# Patient Record
Sex: Female | Born: 1950
Health system: Southern US, Community
[De-identification: ages and names within clinical notes are randomized; demographics above are authoritative.]

## PROBLEM LIST (undated history)

## (undated) DIAGNOSIS — F419 Anxiety disorder, unspecified: Secondary | ICD-10-CM

## (undated) DIAGNOSIS — Z8489 Family history of other specified conditions: Secondary | ICD-10-CM

## (undated) DIAGNOSIS — F32A Depression, unspecified: Secondary | ICD-10-CM

## (undated) DIAGNOSIS — C50919 Malignant neoplasm of unspecified site of unspecified female breast: Secondary | ICD-10-CM

## (undated) DIAGNOSIS — G9332 Myalgic encephalomyelitis/chronic fatigue syndrome: Secondary | ICD-10-CM

## (undated) DIAGNOSIS — Z9189 Other specified personal risk factors, not elsewhere classified: Secondary | ICD-10-CM

## (undated) DIAGNOSIS — E785 Hyperlipidemia, unspecified: Secondary | ICD-10-CM

## (undated) DIAGNOSIS — Z8719 Personal history of other diseases of the digestive system: Secondary | ICD-10-CM

## (undated) DIAGNOSIS — R600 Localized edema: Secondary | ICD-10-CM

## (undated) DIAGNOSIS — R51 Headache: Secondary | ICD-10-CM

## (undated) DIAGNOSIS — T4145XA Adverse effect of unspecified anesthetic, initial encounter: Secondary | ICD-10-CM

## (undated) DIAGNOSIS — G9050 Complex regional pain syndrome I, unspecified: Secondary | ICD-10-CM

## (undated) DIAGNOSIS — M3500 Sicca syndrome, unspecified: Secondary | ICD-10-CM

## (undated) DIAGNOSIS — I10 Essential (primary) hypertension: Secondary | ICD-10-CM

## (undated) DIAGNOSIS — R5382 Chronic fatigue, unspecified: Secondary | ICD-10-CM

## (undated) DIAGNOSIS — R011 Cardiac murmur, unspecified: Secondary | ICD-10-CM

## (undated) DIAGNOSIS — M2550 Pain in unspecified joint: Secondary | ICD-10-CM

## (undated) DIAGNOSIS — G629 Polyneuropathy, unspecified: Secondary | ICD-10-CM

## (undated) DIAGNOSIS — I341 Nonrheumatic mitral (valve) prolapse: Secondary | ICD-10-CM

## (undated) DIAGNOSIS — F329 Major depressive disorder, single episode, unspecified: Secondary | ICD-10-CM

## (undated) DIAGNOSIS — M545 Low back pain, unspecified: Secondary | ICD-10-CM

## (undated) DIAGNOSIS — I739 Peripheral vascular disease, unspecified: Secondary | ICD-10-CM

## (undated) DIAGNOSIS — Z860100 Personal history of colon polyps, unspecified: Secondary | ICD-10-CM

## (undated) DIAGNOSIS — M1990 Unspecified osteoarthritis, unspecified site: Secondary | ICD-10-CM

## (undated) DIAGNOSIS — G8929 Other chronic pain: Secondary | ICD-10-CM

## (undated) DIAGNOSIS — T8859XA Other complications of anesthesia, initial encounter: Secondary | ICD-10-CM

## (undated) DIAGNOSIS — Z8709 Personal history of other diseases of the respiratory system: Secondary | ICD-10-CM

## (undated) DIAGNOSIS — R42 Dizziness and giddiness: Secondary | ICD-10-CM

## (undated) DIAGNOSIS — Z86718 Personal history of other venous thrombosis and embolism: Secondary | ICD-10-CM

## (undated) DIAGNOSIS — M62838 Other muscle spasm: Secondary | ICD-10-CM

## (undated) DIAGNOSIS — M797 Fibromyalgia: Secondary | ICD-10-CM

## (undated) DIAGNOSIS — D649 Anemia, unspecified: Secondary | ICD-10-CM

## (undated) HISTORY — PX: NASAL SINUS SURGERY: SHX719

## (undated) HISTORY — PX: COLONOSCOPY: SHX174

## (undated) HISTORY — DX: Anxiety disorder, unspecified: F41.9

## (undated) HISTORY — DX: Major depressive disorder, single episode, unspecified: F32.9

## (undated) HISTORY — DX: Myalgic encephalomyelitis/chronic fatigue syndrome: G93.32

## (undated) HISTORY — DX: Headache: R51

## (undated) HISTORY — PX: ESOPHAGOGASTRODUODENOSCOPY: SHX1529

## (undated) HISTORY — PX: LUMBAR DISC SURGERY: SHX700

## (undated) HISTORY — PX: TONSILLECTOMY: SUR1361

## (undated) HISTORY — DX: Chronic fatigue, unspecified: R53.82

## (undated) HISTORY — PX: OTHER SURGICAL HISTORY: SHX169

## (undated) HISTORY — PX: BACK SURGERY: SHX140

## (undated) HISTORY — DX: Unspecified osteoarthritis, unspecified site: M19.90

## (undated) HISTORY — DX: Fibromyalgia: M79.7

## (undated) HISTORY — DX: Essential (primary) hypertension: I10

## (undated) HISTORY — DX: Nonrheumatic mitral (valve) prolapse: I34.1

## (undated) HISTORY — DX: Depression, unspecified: F32.A

## (undated) HISTORY — PX: INNER EAR SURGERY: SHX679

## (undated) HISTORY — DX: Complex regional pain syndrome I, unspecified: G90.50

---

## 1994-03-31 HISTORY — PX: FOOT SURGERY: SHX648

## 1997-03-31 HISTORY — PX: OTHER SURGICAL HISTORY: SHX169

## 1997-10-24 ENCOUNTER — Other Ambulatory Visit: Admission: RE | Admit: 1997-10-24 | Discharge: 1997-10-24 | Payer: Self-pay | Admitting: *Deleted

## 1998-03-31 ENCOUNTER — Emergency Department (HOSPITAL_COMMUNITY): Admission: EM | Admit: 1998-03-31 | Discharge: 1998-03-31 | Payer: Self-pay | Admitting: Emergency Medicine

## 1998-03-31 ENCOUNTER — Encounter: Payer: Self-pay | Admitting: Emergency Medicine

## 1998-04-05 ENCOUNTER — Encounter: Payer: Self-pay | Admitting: Orthopedic Surgery

## 1998-04-05 ENCOUNTER — Ambulatory Visit (HOSPITAL_COMMUNITY): Admission: RE | Admit: 1998-04-05 | Discharge: 1998-04-05 | Payer: Self-pay | Admitting: Orthopedic Surgery

## 1998-04-17 ENCOUNTER — Emergency Department (HOSPITAL_COMMUNITY): Admission: EM | Admit: 1998-04-17 | Discharge: 1998-04-17 | Payer: Self-pay | Admitting: Emergency Medicine

## 1998-04-19 ENCOUNTER — Inpatient Hospital Stay (HOSPITAL_COMMUNITY): Admission: RE | Admit: 1998-04-19 | Discharge: 1998-04-22 | Payer: Self-pay | Admitting: Orthopedic Surgery

## 1998-12-18 ENCOUNTER — Other Ambulatory Visit: Admission: RE | Admit: 1998-12-18 | Discharge: 1998-12-18 | Payer: Self-pay | Admitting: *Deleted

## 1999-11-05 ENCOUNTER — Encounter: Payer: Self-pay | Admitting: Neurosurgery

## 1999-11-07 ENCOUNTER — Inpatient Hospital Stay (HOSPITAL_COMMUNITY): Admission: RE | Admit: 1999-11-07 | Discharge: 1999-11-07 | Payer: Self-pay | Admitting: Neurosurgery

## 1999-11-07 ENCOUNTER — Encounter: Payer: Self-pay | Admitting: Neurosurgery

## 2000-04-03 ENCOUNTER — Encounter: Admission: RE | Admit: 2000-04-03 | Discharge: 2000-04-03 | Payer: Self-pay | Admitting: Family Medicine

## 2000-04-03 ENCOUNTER — Encounter: Payer: Self-pay | Admitting: Family Medicine

## 2001-03-14 ENCOUNTER — Encounter: Payer: Self-pay | Admitting: Emergency Medicine

## 2001-03-14 ENCOUNTER — Emergency Department (HOSPITAL_COMMUNITY): Admission: EM | Admit: 2001-03-14 | Discharge: 2001-03-14 | Payer: Self-pay | Admitting: Emergency Medicine

## 2003-03-23 ENCOUNTER — Other Ambulatory Visit: Admission: RE | Admit: 2003-03-23 | Discharge: 2003-03-23 | Payer: Self-pay | Admitting: Family Medicine

## 2003-03-30 ENCOUNTER — Encounter: Admission: RE | Admit: 2003-03-30 | Discharge: 2003-03-30 | Payer: Self-pay | Admitting: Family Medicine

## 2003-04-21 ENCOUNTER — Inpatient Hospital Stay (HOSPITAL_COMMUNITY): Admission: EM | Admit: 2003-04-21 | Discharge: 2003-04-25 | Payer: Self-pay

## 2004-04-24 ENCOUNTER — Encounter: Admission: RE | Admit: 2004-04-24 | Discharge: 2004-04-24 | Payer: Self-pay | Admitting: Family Medicine

## 2004-05-01 ENCOUNTER — Encounter: Admission: RE | Admit: 2004-05-01 | Discharge: 2004-05-01 | Payer: Self-pay | Admitting: Family Medicine

## 2004-05-13 ENCOUNTER — Other Ambulatory Visit: Admission: RE | Admit: 2004-05-13 | Discharge: 2004-05-13 | Payer: Self-pay | Admitting: Obstetrics and Gynecology

## 2004-06-12 ENCOUNTER — Emergency Department (HOSPITAL_COMMUNITY): Admission: EM | Admit: 2004-06-12 | Discharge: 2004-06-12 | Payer: Self-pay | Admitting: Emergency Medicine

## 2005-10-14 ENCOUNTER — Encounter: Admission: RE | Admit: 2005-10-14 | Discharge: 2005-10-14 | Payer: Self-pay | Admitting: Family Medicine

## 2006-01-21 ENCOUNTER — Encounter: Admission: RE | Admit: 2006-01-21 | Discharge: 2006-01-21 | Payer: Self-pay | Admitting: Family Medicine

## 2006-04-07 ENCOUNTER — Ambulatory Visit (HOSPITAL_COMMUNITY): Admission: RE | Admit: 2006-04-07 | Discharge: 2006-04-08 | Payer: Self-pay | Admitting: Surgery

## 2006-04-07 ENCOUNTER — Encounter (INDEPENDENT_AMBULATORY_CARE_PROVIDER_SITE_OTHER): Payer: Self-pay | Admitting: Specialist

## 2006-04-07 HISTORY — PX: OTHER SURGICAL HISTORY: SHX169

## 2006-06-18 ENCOUNTER — Ambulatory Visit (HOSPITAL_BASED_OUTPATIENT_CLINIC_OR_DEPARTMENT_OTHER): Admission: RE | Admit: 2006-06-18 | Discharge: 2006-06-18 | Payer: Self-pay | Admitting: Orthopedic Surgery

## 2006-06-18 HISTORY — PX: OTHER SURGICAL HISTORY: SHX169

## 2007-01-13 ENCOUNTER — Other Ambulatory Visit: Admission: RE | Admit: 2007-01-13 | Discharge: 2007-01-13 | Payer: Self-pay | Admitting: Obstetrics and Gynecology

## 2007-02-09 ENCOUNTER — Encounter: Admission: RE | Admit: 2007-02-09 | Discharge: 2007-02-09 | Payer: Self-pay | Admitting: Family Medicine

## 2007-02-17 ENCOUNTER — Encounter: Admission: RE | Admit: 2007-02-17 | Discharge: 2007-02-17 | Payer: Self-pay | Admitting: Family Medicine

## 2007-04-03 ENCOUNTER — Emergency Department (HOSPITAL_COMMUNITY): Admission: EM | Admit: 2007-04-03 | Discharge: 2007-04-03 | Payer: Self-pay | Admitting: Emergency Medicine

## 2007-05-11 ENCOUNTER — Ambulatory Visit (HOSPITAL_COMMUNITY): Admission: RE | Admit: 2007-05-11 | Discharge: 2007-05-11 | Payer: Self-pay | Admitting: Urology

## 2008-05-25 ENCOUNTER — Other Ambulatory Visit: Admission: RE | Admit: 2008-05-25 | Discharge: 2008-05-25 | Payer: Self-pay | Admitting: Obstetrics and Gynecology

## 2008-05-30 ENCOUNTER — Encounter: Admission: RE | Admit: 2008-05-30 | Discharge: 2008-05-30 | Payer: Self-pay | Admitting: Family Medicine

## 2008-07-05 ENCOUNTER — Ambulatory Visit: Payer: Self-pay | Admitting: Urology

## 2009-01-05 ENCOUNTER — Encounter: Admission: RE | Admit: 2009-01-05 | Discharge: 2009-01-05 | Payer: Self-pay | Admitting: Neurosurgery

## 2009-01-30 ENCOUNTER — Inpatient Hospital Stay (HOSPITAL_COMMUNITY): Admission: RE | Admit: 2009-01-30 | Discharge: 2009-02-01 | Payer: Self-pay | Admitting: Neurosurgery

## 2009-01-30 HISTORY — PX: POSTERIOR LUMBAR FUSION: SHX6036

## 2009-05-24 ENCOUNTER — Encounter: Admission: RE | Admit: 2009-05-24 | Discharge: 2009-05-24 | Payer: Self-pay | Admitting: Orthopedic Surgery

## 2009-06-22 ENCOUNTER — Encounter: Admission: RE | Admit: 2009-06-22 | Discharge: 2009-09-20 | Payer: Self-pay | Admitting: Neurosurgery

## 2009-10-02 ENCOUNTER — Encounter
Admission: RE | Admit: 2009-10-02 | Discharge: 2009-12-13 | Payer: Self-pay | Source: Home / Self Care | Admitting: Neurosurgery

## 2010-03-12 ENCOUNTER — Other Ambulatory Visit
Admission: RE | Admit: 2010-03-12 | Discharge: 2010-03-12 | Payer: Self-pay | Source: Home / Self Care | Admitting: Family Medicine

## 2010-04-21 ENCOUNTER — Encounter: Payer: Self-pay | Admitting: Family Medicine

## 2010-07-04 LAB — CBC
HCT: 35.6 % — ABNORMAL LOW (ref 36.0–46.0)
Hemoglobin: 12.2 g/dL (ref 12.0–15.0)
MCHC: 34.2 g/dL (ref 30.0–36.0)
MCV: 92.2 fL (ref 78.0–100.0)
Platelets: 267 10*3/uL (ref 150–400)
RBC: 3.86 MIL/uL — ABNORMAL LOW (ref 3.87–5.11)
RDW: 13.6 % (ref 11.5–15.5)
WBC: 10.3 10*3/uL (ref 4.0–10.5)

## 2010-07-04 LAB — BASIC METABOLIC PANEL
BUN: 29 mg/dL — ABNORMAL HIGH (ref 6–23)
CO2: 29 mEq/L (ref 19–32)
Calcium: 10 mg/dL (ref 8.4–10.5)
Chloride: 104 mEq/L (ref 96–112)
Creatinine, Ser: 1.23 mg/dL — ABNORMAL HIGH (ref 0.4–1.2)
GFR calc Af Amer: 54 mL/min — ABNORMAL LOW (ref >60)
GFR calc non Af Amer: 45 mL/min — ABNORMAL LOW (ref >60)
Glucose, Bld: 92 mg/dL (ref 70–99)
Potassium: 4.5 mEq/L (ref 3.5–5.1)
Sodium: 140 mEq/L (ref 135–145)

## 2010-07-04 LAB — TYPE AND SCREEN
ABO/RH(D): A POS
Antibody Screen: NEGATIVE

## 2010-07-04 LAB — ABO/RH: ABO/RH(D): A POS

## 2010-08-16 NOTE — Op Note (Signed)
Connie Bennett, Connie Bennett             ACCOUNT NO.:  [id number]   MEDICAL RECORD NO.:  [id number]          PATIENT TYPE:  AMB   LOCATION:  SDS                          FACILITY:  [facility]   PHYSICIAN:  Velora Heckler, MD      DATE OF BIRTH:  06/15/1950   DATE OF PROCEDURE:  04/07/2006  DATE OF DISCHARGE:                               OPERATIVE REPORT   PREOPERATIVE DIAGNOSIS:  Primary hyperparathyroidism.   POSTOPERATIVE DIAGNOSIS:  Primary hyperparathyroidism.   PROCEDURES:  1. Neck exploration.  2. Right inferior parathyroidectomy.   ATTENDING SURGEON:  Velora Heckler, MD, FACS   ASSISTANTS:  Angelia Mould. Derrell Lolling, MD, FACS  Gabrielle Dare. Janee Morn, MD, FACS   ANESTHESIA:  General.   ESTIMATED BLOOD LOSS:  Minimal.   PREPARATION:  Betadine.   COMPLICATIONS:  None.   INDICATIONS:  The patient is a 60 year old white female from [place],  [place].  The patient was evaluated by Dr. Donia Guiles for  hypercalcemia.  She was found to have a serum calcium level of 11.9.  an  intact PTH level was elevated at 98.  The patient underwent Sestamibi  scanning, which demonstrated increased uptake in the right inferior  position and a paratracheal space in the upper thorax consistent with  parathyroid adenoma.  The patient was referred for parathyroidectomy.   BODY OF REPORT:  The procedures were done in OR #16 at the [facility].  The patient was brought to the operating room and  placed in a supine position on the operating room table.  Following  administration of general anesthesia, the patient is positioned and then  prepped and draped in the usual strict aseptic fashion.  After  ascertaining that an adequate level of anesthesia had been obtained, a  right inferior neck incision is made with a #10 blade.  The incision is  approximately 3 cm long.  It is carried through subcutaneous tissues and  platysma.  Subplatysmal flaps are developed circumferentially, and a  Weitlaner retractor is placed.  The strap muscles are incised in the  midline and reflected laterally.  The inferior pole of the right thyroid  lobe is exposed.  Dissection is then begun in the right paratracheal  space, as indicated by the nuclear medicine scan.  However, exploration  reveals only diffuse, mildly inflammatory-appearing lymph nodes.  No  evidence of adenoma is identified.  The right thyroid lobe is dissected  out.  There is some inflammatory change along the capsule of the  thyroid, consistent with mild underlying thyroiditis.  Further  exploration reveals exposure of the recurrent laryngeal nerve, which is  then dissected out along its length in the right tracheoesophageal  groove.  Great care is taken to prevent injury to the nerve.  The  retroesophageal space is opened.  Dissection is carried into the  posterior mediastinum.  The paratracheal space is thoroughly explored.  The carotid sheath is opened and explored.  Biopsies of suspicious  nodules are taken, but these prove to be benign lymph nodes.  The  incision is extended slightly and the right thyroid  lobe is fully  mobilized.  The superior pole is explored.  A likely candidate for  superior normal parathyroid gland is identified.  The decision is made  to proceed with left neck exploration.  An incision is extended across  the midline.  Skin flaps are elevated.  A Mahorner  retractor is placed.  Strap muscles are incised in the midline, and the left thyroid lobe is  exposed.  The left neck is explored.  A superior parathyroid gland is  positively identified in the normal anatomic position above the level of  the inferior thyroid artery.  Exploration in the left inferior region  shows a thyrothymic tract, which is excised.  In the superior portion of  the thyrothymic tract is a small, approximately 4-mm nodule, which is  submitted to pathology, and Dr. Charlott Rakes confirms this to be a normal  parathyroid gland.   Therefore, both parathyroid glands on the left are  accounted for and appear normal, or are histologically normal.   Next, we turned our attention back to the right neck.  Further  exploration is then performed.  After thorough repeat exploration of the  right neck, a small tubular structure is noted immediately behind the  head of the right clavicle.  This is grasped and carefully dissected  out.  It is gently retracted cephalad.  This appears to be the  thyrothymic tract.  It is bluntly dissected into the anterior  mediastinum and mobilized.  It is opened.  Delivered from it is a  nodular grayish-brown density measuring less than 1 cm in size.  The  remainder of the thyrothymic tract is also excised, using the  electrocautery for hemostasis.  The nodule is excised from the  surrounding thymic tissue.  A suture is placed through the nodule and it  is submitted to pathology for frozen section.  Dr. Charlott Rakes confirms  parathyroid tissue consistent with parathyroid adenoma.  Good hemostasis  is obtained throughout the neck.  The neck is irrigated.  Surgicel is  placed in both sides of the wound.  Strap muscles are reapproximated in  the midline with interrupted 3-0 Vicryl sutures.  The platysma is closed  with interrupted 3-0 Vicryl sutures.  The skin edges are reapproximated  with a running 4-0 Vicryl subcuticular suture.  The wound is washed and  dried and benzoin and Steri-Strips are applied.  The patient is awakened  from anesthesia and brought to the recovery room in stable condition.  The patient tolerated the procedure well.   Due to the necessity of bilateral neck exploration and the overall  difficulty of this case, additional time was required, as well as an  additional Geophysicist/field seismologist.  The case started at approximately 9:15 a.m. and  ended at 12:30 p.m.  Normally, this is approximately a 90-minute case. Therefore, an extra 90 minutes were required due to the enhanced  difficulty  of this procedure.      Velora Heckler, MD  Electronically Signed     TMG/MEDQ  D:  04/07/2006  T:  04/07/2006  Job:  [id number]   cc:   Angelia Mould. Derrell Lolling, M.D.  Gabrielle Dare Janee Morn, M.D.  Donia Guiles, M.D.
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
## 2010-08-16 NOTE — Op Note (Signed)
Connie, Bennett NO.:  [id number]   MEDICAL RECORD NO.:  [id number]          PATIENT TYPE:  AMB   LOCATION:  NESC                         FACILITY:  [facility]   PHYSICIAN:  Deidre Ala, M.D.    DATE OF BIRTH:  1950/10/14   DATE OF PROCEDURE:  06/18/2006  DATE OF DISCHARGE:                               OPERATIVE REPORT   PREOPERATIVE DIAGNOSIS:  1. Right carpal tunnel syndrome, severe.  2. Right third finger trigger finger stenosing tenosynovitis A1      pulley.   POSTOPERATIVE DIAGNOSIS:  1. Right carpal tunnel syndrome, severe.  2. Right third finger trigger finger stenosing tenosynovitis A1      pulley.   PROCEDURE:  1. Right carpal tunnel release.  2. Release A1 pulley right middle finger STS.   SURGEON:  Drema Pry, M.D.   ASSISTANT:  Phineas Semen, P.A.-C.   ANESTHESIA:  General with LMA.   CULTURES:  None.   DRAINS:  None.   ESTIMATED BLOOD LOSS:  Minimal.   TOURNIQUET TIME:  30 minutes.   PATHOLOGIC FINDINGS AND HISTORY:  Connie Bennett has had miserable pain from  right carpal tunnel, with positive nerve conduction studies for severe  carpal tunnel.  She also has had evidence of trigger finger right third  which has worsened recently and is actively catching, and she desired to  have it released today.  At surgery, she had a very thick transverse  carpal ligament, with a reddened hourglass nerve, and she had a very  stenotic A1 pulley, with good release obtained.   PROCEDURE:  With adequate anesthesia obtained using LMA technique, 1 g  Ancef given IV prophylaxis, the patient was placed in the supine  position.  The right upper extremity was prepped from the fingertips to  the upper forearm in the standard fashion.  After standard prepping and  draping, Esmarch examination was used. The tourniquet was let up to 250  mmHg.  An incision was then made longitudinally in the base of the palm  along the skin flexion crease curvilinear.  Incision was deepened sharply  and adequate hemostasis obtained using the Bovie electrocoagulator.  Under loupe magnification, dissection was carried down to the palmar  fascia.  I then placed a Freer elevator to protect the nerve and cut  down upon with a 64 Beaver blade.  This exposed the nerve.  I then  carefully neurolysed the nerve distally, including out all branches  including the motor branch and proximally, releasing the distal  transverse wrist flexion crease on the ulnar side of the wrist.  Epineurium was dissected off the volar nerve.  Irrigation was carried  out, and bleeding points were cauterized.  Attention was then turned to  the third finger A1 pulley, where an incision was then made along the  distal palmar flexion crease in the skin lines transversely obliquely.  Incision was deepened sharply with a knife, and hemostasis was obtained  using the Bovie electrocoagulated.  Dissection was carried down to the  A1 pulley, and the neurovascular bundles were retracted and protected  bilaterally.  I then released  the A1 pulley and removed a portion of it  so would not regrow, thus releasing the tendon and allowing free glide.  Irrigation was carried out. Both wounds were closed with interrupted and  running 4-0 nylon.  A bulky sterile compressive dressing was applied  with volar plaster splint in slight cock-up.  The patient then, having  tolerated the procedure well, was awakened and taken to the recovery  room in satisfactory condition, to be discharged per outpatient routine,  given Vicodin for pain, and told to call the office for appointment for  recheck on Monday.           ______________________________  V. Charlesetta Shanks, M.D.     VEP/MEDQ  D:  06/18/2006  T:  06/18/2006  Job:  [id number]   cc:   Donia Guiles, M.D.  Fax: [phone]   Velora Heckler, MD  [phone] [address] [place]  [place] [address]

## 2010-08-16 NOTE — Discharge Summary (Signed)
NAMEHANSINI, Connie Bennett                       ACCOUNT NO.:  [id number]   MEDICAL RECORD NO.:  [id number]                   PATIENT TYPE:  INP   LOCATION:  3736                                 FACILITY:  [facility]   PHYSICIAN:  Melissa L. Ladona Ridgel, MD               DATE OF BIRTH:  21-Oct-1950   DATE OF ADMISSION:  04/21/2003  DATE OF DISCHARGE:  04/25/2003                                 DISCHARGE SUMMARY   ADMISSION DIAGNOSIS:  Unintentional drug overdose.   DISCHARGE DIAGNOSES:  1. Unintentional drug overdose.  2. Hypertension.  3. Fibromyalgia.  4. Reflex sympathetic dystrophy.   DISCHARGE MEDICATIONS:  1. Zetia 10 mg daily.  2. Altace 10 mg daily.  3. Avelox 400 mg daily to treat presumptive aspiration pneumonia.  4. Coumadin, on hold until titrated by Dr. Arvilla Market, last INR 4.1 on April 25, 2003.  5. Effexor XR 150 mg once daily.  6. Lasix 40 mg once daily, a decreased dose from her home dose of 80 mg once     daily.  7. Potassium chloride 40 mEq twice daily.  8. Lidoderm 5% patches to the bilateral hips, 12 hours on, 12 hours off.  9. Procardia 10 mg p.o. t.i.d.  10.      Lipitor 20 mg once daily.  11.      TriCor, unknown dosage, once daily.  12.      Fosamax 70 mg once weekly.  13.      MS Contin 15 mg q.12h.   MEDICATIONS THE PATIENT WAS INSTRUCTED NOT TO TAKE UNTIL FURTHER INSTRUCTED:  1. Coumadin.  2. Provigil.  3. Hydroxyzine.  4. Robaxin.  5. Xanax.  6. Neurontin.  7. Gabitril.  8. Seroquel.  9. Premarin.  10.      Afrin.   HISTORY OF PRESENT ILLNESS:  The patient is a 60 year old white female with  a past medical history for fibromyalgia and reflex sympathetic dystrophy who  is managed by her pain physician, Dr. Gerlene Burdock __________, in [place],  and her psychiatrist, Dr. Betti Cruz, here at [facility] on [address]  On the day of admission, her family members reported that the  patient had been taking all of her medications according  to the doctor's  instructions.  At about 5:30, her sister noted that on visiting with the  patient that she was not feeling right.  She seemed groggy and confused.  The patient informed her sister that she had taken two pain medications and  muscle relaxors.  She then fell asleep.  At approximately 9:30 her husband  had noticed that her breathing pattern had changed, she was taking deep  breaths that were spaced far apart.  She was very lethargic and could not be  aroused.  He noticed that her breaths became more shallow and her lips  turned blue, so he contacted EMS.  On EMS, the patient's blood pressure  was  140/38 with a pulse of 84.  Respirations were slow and agonal.  The EMS crew  attempted to intubate the patient unsuccessfully.  The attempt stimulated  some vomiting.  She was given 2 mg of Narcan on route which did cause her to  become more alert and able to answer questions.  In the emergency room, the  patient was noted to remain alert for only short periods of time in between  doses of Narcan.  She was admitted to the ICU with further Narcan dosing.  On the day following admission, the patient was evaluated and continued to  be somnolent with increased extrapyramidal symptoms, facial ticking,  twitching, eye rolling.  She was not easily arousable, nor was she oriented  to time, place, or person.  She was evaluated for other toxic overdoses like  Tylenol, salicylate, and ABG was completed showing that she was mildly  hypoxic and mildly hypercarbic.  She was acidotic at 7.249, with a PCO2 of  63, and O2 of 59, and a bicarbonate of 27.6.  She was started on BiPAP which  she tolerated throughout most of the night with improved alertness and  oxygenation on the following day.  Blood gas eventually was found to be  7.49, PCO2 of 34.2, oxygen of 164, bicarbonate of 26.6.  All of her  medications were discontinued with the exception of her Effexor 150 mg  daily.  At the advise of the  psychiatry consult, we did continue her  Effexor.  Slowly she began to become more alert, cooperative, and did,  however, show varying degrees of emotional lability between anger and  apology, tears and high anxiety.  She was very hypervigilant during her  course of her stay here in ICU.  She was noted on the day following  admission to have a white count of 11.2, and a slight temperature elevation.  She was therefore started on moxifloxacin IV to cover for aspiration  pneumonia.  Her LFTs were mildly elevated and trended during the course of  this stay.  At the time of discharge, they still remained slightly elevated.  In addition to her Effexor, her MS Contin at 15 mg b.i.d. was added back to  her regimen to help with some of her pain management.  She, however, was not  given excessive amounts of p.r.n. pain medication.  She did tolerate this  very well, and even though she stated verbally that she was in pain,  physically she did not appear to be uncomfortable.  Her heart rate remained  within normal limits.  She did not become tachycardic.  Her blood pressure  did start to rise on the third day after her antihypertensive medications  had been held, and so they were resumed.  The patient's hospital course was  complicated only by two other minor issues.  One was a small pressure sore  area on her sacrum which did not reveal any breakdown;  merely was  erythematous and sore.  She complained of nasal congestion secondary to  withdrawing from Afrin use which she appears to use excessively at home, and  she also suffered from hypokalemia secondary to the administration of her  diuretic.  On the day of discharge, the patient's exam remained:  Vital  signs 98.4, blood pressure 136/81, heart rate of 83, respiratory rate of 20.  She generally was in no acute distress.  Her mood and affect were variable,  but were appropriate.  She was mildly anxious and mildly  hypervigilance behavior compared to  her admission behavior.  She was noted to have multiple  episodes of eye ticking bilaterally which did resolve when the behavior was  brought to her attention.  She did display some insight into her issues and  events, and did express appropriately anxiety over the recent hospital stay.  Pupils equal, round, reactive to light.  Extraocular movements were intact.  She is anicteric.  Mucous membranes are moist.  Her chest was clear to  auscultation with no rales, rhonchi, or wheezes.  Cardiovascular was regular  rate and rhythm, positive S1 and S2.  Her abdomen was soft, nontender.  She  still remained with mild pink erythema to her buttocks.  Her extremities  showed no edema.  At discharge, her sodium was 144, potassium was 2.8,  chloride 107, CO2 29, BUN 7, creatinine 0.8.  Her glucose was 94.  White  count was 9.5, hemoglobin 10.9, hematocrit 32, her platelets were 33.1.  Her  AST was 39, ALT 43.  Prior to discharge, the patient did receive one 10 mEq  IV dose of potassium, and further doses of potassium were not tolerated.  She was therefore supplemented with oral potassium.  To facilitate her  discharge planning, a 15 minute conversation was undertaken with Dr. Gerlene Burdock  __________, her primary pain physician in [place].  He agreed with the  withdrawal of most of her medications and the bare minimum being added.  He  agreed to see the patient on May 02, 2003, at 12:45.  A second 15 minute  conversation was undertaken with Dr. Betti Cruz, her psychiatrist.  Observations  during the course of her hospitalization were shared with the physician.  He  also agreed to see the patient in close followup on May 03, 2003, at  3:45.  Dr. Arvilla Market was informed with a 5 minute conversation regarding the  patient's hospitalization and plans to have her follow up on April 27, 2003, were made with his office.  Dr. Arvilla Market needs to follow up on the  patient's potassium, her PT, INR, and redose  her Coumadin.  Her ST  elevation.  It had been discussed during the course of the hospitalization  whether the patient would need a sleep study.  She has had sleep dysfunction  for many months now and this may be contributing to the possible confusion  that led to her overdose.  I asked the patient to discuss this with Dr.  Arvilla Market, and the decision will be made between the patient and her doctor.  At the time of discharge, the full discharge instruction sheets were  reviewed with the patient and her spouse.  She was encouraged to go to the  emergency room if she became confused or had increasing anxiety.  She was  instructed not to try to self-medicate.  She was instructed to pay close  attention to the fact that her Coumadin needed to be redosed by Dr.  Arvilla Market.  The above points were very much stressed with the patient and her  spouse prior to discharge.                                                Melissa L. Ladona Ridgel, MD   MLT/MEDQ  D:  04/25/2003  T:  04/25/2003  Job:  [id number]   cc:   Donia Guiles, M.D.  [address] [place]  [place] [address]  Fax: [phone]   Daine Floras, M.D.  [address]  [place]  [place] [address]  Fax: [phone]   Dr. Malena Peer  [facility]  [place], [place]

## 2010-11-13 ENCOUNTER — Encounter: Payer: Self-pay | Admitting: Pulmonary Disease

## 2010-11-14 ENCOUNTER — Ambulatory Visit (INDEPENDENT_AMBULATORY_CARE_PROVIDER_SITE_OTHER): Payer: Medicare Other | Admitting: Pulmonary Disease

## 2010-11-14 ENCOUNTER — Encounter: Payer: Self-pay | Admitting: Pulmonary Disease

## 2010-11-14 VITALS — BP 98/56 | HR 70 | Temp 98.2°F | Ht 63.0 in | Wt 154.6 lb

## 2010-11-14 DIAGNOSIS — G4731 Primary central sleep apnea: Secondary | ICD-10-CM | POA: Insufficient documentation

## 2010-11-14 DIAGNOSIS — G4710 Hypersomnia, unspecified: Secondary | ICD-10-CM

## 2010-11-14 NOTE — Progress Notes (Signed)
Subjective:    Patient ID: Connie Bennett, female    DOB: 12/24/1950, 60 y.o.   MRN: [id number]  HPI CC: Dr. Betti Cruz, Dr. Haskel Khan  60 yo female for sleep evaluation.  She has trouble staying awake.  She can fall asleep while standing up or eating.  She is not aware anything is going to happen first.  She denies any history of head injuries, or seizures.  She can go days without sleeping.   When she tries to fall asleep it can take hours.  She always seems to have things on her mind before going to bed.  She is always tired.  She is not aware of snoring much.  She does occasional talk in her sleep.  She has been taking trazodone for years, but she thinks this is causing headaches in the morning.  She has trouble finding a comfortable position to sleep due to pain.  She will drink tea in the morning, but does not use anything else to help her sleep.  She has noticed occasional instances in which she can not move her body shortly after waking up.  This seems to be associated with being in a dream usually related to seeing somebody in the house.  Epworth score is 6 out of 24.  Past Medical History  Diagnosis Date  . HTN (hypertension)   . Hypercholesterolemia   . Reflex sympathetic dystrophy   . Headache   . Fibromyalgia   . Osteoporosis   . Mitral valve prolapse   . DVT (deep vein thrombosis) in pregnancy   . Depression   . Hyperparathyroidism      Family History  Problem Relation Age of Onset  . Heart murmur Mother   . Heart murmur Sister     x2  . Cancer Mother     rare type of liver cancer     History   Social History  . Marital Status: Married    Spouse Name: N/A    Number of Children: 1  . Years of Education: N/A   Occupational History  . unemployed     hair stylist  . disabled    Social History Main Topics  . Smoking status: Current Everyday Smoker -- 1.0 packs/day for 25 years  . Smokeless tobacco: Not on file  . Alcohol Use: No  . Drug Use: No  . Sexually  Active: Not on file   Other Topics Concern  . Not on file   Social History Narrative  . No narrative on file     Allergies  Allergen Reactions  . Latex   . Sulfa Antibiotics   . Penicillins Rash    Review of Systems  Constitutional: Positive for appetite change and unexpected weight change.  HENT: Positive for congestion.   Musculoskeletal: Positive for joint swelling.  Neurological: Positive for headaches.  Psychiatric/Behavioral: Positive for dysphoric mood.       Objective:   Physical Exam  BP 98/56  Pulse 70  Temp(Src) 98.2 F (36.8 C) (Oral)  Ht 5\' 3"  (1.6 m)  Wt 154 lb 9.6 oz (70.126 kg)  BMI 27.39 kg/m2  SpO2 95%  General - Thin HEENT - PERRLA, EOMI, no sinus tenderness, MP 2 airway, no LAN Cardiac - s1s2 regular, 2/6 systolic murmur Chest - no wheeze/rales/dullness Abd - soft, nontender, normal bowel sounds Ext - minimal ankle edema, no cyanosis/clubbing Neuro - normal strength, A&O x 3 Psych - normal mood/behavior     Assessment & Plan:   Hypersomnia  She has some symptoms that are suggestive of sleep disordered breathing.  She also has drop attacks, episodes of sleep paralysis, and episodes of hypnagogic hallucinations.    To further assess the nature of her sleep problems will arrange for overnight polysomnogram followed by multiple sleep latency test.    Updated Medication List Outpatient Encounter Prescriptions as of 11/14/2010  Medication Sig Dispense Refill  . alendronate (FOSAMAX) 70 MG tablet Take 70 mg by mouth every 7 (seven) days. Take with a full glass of water on an empty stomach.       . ALPRAZolam (XANAX) 0.5 MG tablet Take 0.5 mg by mouth 2 (two) times daily as needed.        Marland Kitchen ascorbic acid (VITAMIN C) 1000 MG tablet Take 1,000 mg by mouth daily.        . calcium carbonate (OS-CAL) 600 MG TABS Take 600 mg by mouth daily.        Marland Kitchen ezetimibe (ZETIA) 10 MG tablet Take 10 mg by mouth daily.        . fentaNYL (DURAGESIC) 50 MCG/HR  Place 1 patch onto the skin every 3 (three) days.        . fish oil-omega-3 fatty acids 1000 MG capsule 3 capsules once a day       . Flaxseed, Linseed, (FLAX SEED OIL) 1000 MG CAPS 3 capsules once a day       . furosemide (LASIX) 80 MG tablet Take 80 mg by mouth daily.        Marland Kitchen gabapentin (NEURONTIN) 600 MG tablet Take 600 mg by mouth 3 (three) times daily.        Marland Kitchen lidocaine (LIDODERM) 5 % Place 1 patch onto the skin daily. Remove & Discard patch within 12 hours or as directed by MD       . meclizine (ANTIVERT) 25 MG tablet Take 25 mg by mouth 4 (four) times daily as needed.       . Multiple Vitamins-Minerals (CENTRUM PO) Once a day       . NIFEdipine (PROCARDIA) 10 MG capsule Take 10 mg by mouth 3 (three) times daily.        Marland Kitchen oxyCODONE-acetaminophen (PERCOCET) 5-325 MG per tablet Take 1 tablet by mouth every 6 (six) hours as needed.        Marland Kitchen oxymetazoline (AFRIN) 0.05 % nasal spray Place 2 sprays into the nose 2 (two) times daily as needed.        . potassium chloride (KLOR-CON) 10 MEQ CR tablet Take 10 mEq by mouth daily.        . ramipril (ALTACE) 10 MG tablet Take 10 mg by mouth daily.        . rosuvastatin (CRESTOR) 20 MG tablet Take 20 mg by mouth daily.        Marland Kitchen tiZANidine (ZANAFLEX) 4 MG tablet 1 tablet up to 2 times a day as needed       . traZODone (DESYREL) 100 MG tablet 3 tablets at bedtime as needed       . triamcinolone (NASACORT AQ) 55 MCG/ACT nasal inhaler Place 2 sprays into the nose daily as needed.        . venlafaxine (EFFEXOR-XR) 150 MG 24 hr capsule Take 150 mg by mouth daily.        Marland Kitchen DISCONTD: Multiple Vitamins-Minerals (MULTIVITAMIN WITH IRON-MINERALS) liquid Once a day

## 2010-11-14 NOTE — Assessment & Plan Note (Signed)
She has some symptoms that are suggestive of sleep disordered breathing.  She also has drop attacks, episodes of sleep paralysis, and episodes of hypnagogic hallucinations.    To further assess the nature of her sleep problems will arrange for overnight polysomnogram followed by multiple sleep latency test.

## 2010-11-14 NOTE — Patient Instructions (Signed)
Will schedule sleep test Will schedule follow up after sleep test reviewed 

## 2010-12-04 ENCOUNTER — Encounter (HOSPITAL_BASED_OUTPATIENT_CLINIC_OR_DEPARTMENT_OTHER): Payer: Medicare Other

## 2010-12-05 ENCOUNTER — Encounter (HOSPITAL_BASED_OUTPATIENT_CLINIC_OR_DEPARTMENT_OTHER): Payer: Medicare Other

## 2010-12-18 LAB — I-STAT 8, (EC8 V) (CONVERTED LAB)
BUN: 17
Bicarbonate: 25.9 — ABNORMAL HIGH
Chloride: 105
Glucose, Bld: 86
HCT: 36
Hemoglobin: 12.2
Operator id: 192351
Potassium: 3.3 — ABNORMAL LOW
Sodium: 138
TCO2: 27
pCO2, Ven: 44.3 — ABNORMAL LOW
pH, Ven: 7.374 — ABNORMAL HIGH

## 2010-12-18 LAB — POCT I-STAT CREATININE
Creatinine, Ser: 1.3 — ABNORMAL HIGH
Operator id: 192351

## 2010-12-23 ENCOUNTER — Ambulatory Visit (HOSPITAL_BASED_OUTPATIENT_CLINIC_OR_DEPARTMENT_OTHER): Payer: Medicare Other

## 2010-12-24 ENCOUNTER — Encounter (HOSPITAL_BASED_OUTPATIENT_CLINIC_OR_DEPARTMENT_OTHER): Payer: Medicare Other

## 2011-01-06 ENCOUNTER — Ambulatory Visit (HOSPITAL_BASED_OUTPATIENT_CLINIC_OR_DEPARTMENT_OTHER): Payer: Medicare Other | Attending: Pulmonary Disease

## 2011-01-06 DIAGNOSIS — Z79899 Other long term (current) drug therapy: Secondary | ICD-10-CM | POA: Insufficient documentation

## 2011-01-06 DIAGNOSIS — G4730 Sleep apnea, unspecified: Secondary | ICD-10-CM | POA: Insufficient documentation

## 2011-01-06 DIAGNOSIS — G4710 Hypersomnia, unspecified: Secondary | ICD-10-CM

## 2011-01-07 ENCOUNTER — Encounter (HOSPITAL_BASED_OUTPATIENT_CLINIC_OR_DEPARTMENT_OTHER): Payer: Medicare Other

## 2011-01-22 NOTE — Procedures (Signed)
Connie, Bennett NO.:  [id number]  MEDICAL RECORD NO.:  [id number]          PATIENT TYPE:  OUT  LOCATION:  SLEEP CENTER                 FACILITY:  [facility]  PHYSICIAN:  Coralyn Helling, MD        DATE OF BIRTH:  01-19-1951  DATE OF STUDY:  01/06/2011                           NOCTURNAL POLYSOMNOGRAM  REFERRING PHYSICIAN:  Coralyn Helling, MD  FACILITY[facility]  [facility].  INDICATION FOR STUDY:  Connie Bennett is a 60 year old female who has a history of sleep disruption, daytime sleepiness, and reports drop attacks. She is on chronic opioid medications as well. She is referred to the sleep lab for evaluation of hypersomnia with obstructive sleep apnea.  Height is 5 feet 3 inches, weight is 148 pounds, BMI is 26, neck size is 14 inches.  EPWORTH SLEEPINESS SCORE:  10.  MEDICATIONS:  Fosamax, Xanax, vitamin C, Os-Cal, Zetia, fish oil, flaxseed oil, Lasix, Neurontin, Lidoderm, Antivert, Centrum, Procardia, Afrin, Klor-Con, Altace, Duragesic, and Percocet.  SLEEP ARCHITECTURE:  Total recording time was 394 minutes, total sleep time was 284 minutes.  Sleep efficiency was 70%. Sleep latency is 40 minutes.  The study was notable for the lack of stage III sleep and REM sleep.  The patient slept predominantly in the nonsupine position.  RESPIRATORY DATA:  The average respiratory rate was 16.  Moderate snoring was noted by technician.  The overall apnea-hypopnea index was 76.8.  There were 10 obstructive events, 274 central events, 1 mixed respiratory event, and 79 hypopneas.  OXYGEN DATA:  Baseline oxygenation was 98%.  The oxygen saturation nadir was 83%.  The patient spent a total of 9 minutes with an oxygen saturation below 88%.  The study was conducted without the supplemental oxygen.  CARDIAC DATA:  The average heart rate was 63 and the rhythm strip showed sinus rhythm with occasional PVCs.  MOVEMENT-PARASOMNIA:  The periodic limb movement index was 0 and  the patient had no restroom trips.  IMPRESSIONS-RECOMMENDATIONS:  This study shows evidence for severe complex sleep apnea with an overall apnea-hypopnea index of 76.8.  The majority of the events were central in nature, but she did also have obstructive events.  Of note is that she is on chronic opioid medications and this could be affecting her sleep disordered breathing.  I would recommend that the patient return to the sleep lab for a CPAP titration study.     Coralyn Helling, MD Diplomat, American Board of Sleep Medicine    VS/MEDQ  D:  01/21/2011 18:23:25  T:  01/22/2011 05:59:03  Job:  [id number]

## 2011-01-24 ENCOUNTER — Telehealth: Payer: Self-pay | Admitting: Pulmonary Disease

## 2011-01-24 DIAGNOSIS — G4710 Hypersomnia, unspecified: Secondary | ICD-10-CM

## 2011-01-24 NOTE — Telephone Encounter (Signed)
PSG 01/06/11>>AHI 76.8, SpO2 low 83%, mostly centrals, but some obstructive events.  Attempted to call to discuss with pt, but no answer.  Will have my nurse schedule ROV to review results.

## 2011-01-24 NOTE — Telephone Encounter (Signed)
lmomtcb--accidentally closed phone note

## 2011-01-27 NOTE — Telephone Encounter (Signed)
lmomtcb  

## 2011-01-28 NOTE — Telephone Encounter (Signed)
Lm w/ family member for pt to call back 

## 2011-01-29 DIAGNOSIS — Z79899 Other long term (current) drug therapy: Secondary | ICD-10-CM

## 2011-01-29 DIAGNOSIS — G4730 Sleep apnea, unspecified: Secondary | ICD-10-CM

## 2011-01-31 ENCOUNTER — Encounter: Payer: Self-pay | Admitting: *Deleted

## 2011-01-31 NOTE — Telephone Encounter (Signed)
Lmomtcb. Will send letter out to patient home to contact us

## 2011-03-03 ENCOUNTER — Ambulatory Visit: Payer: Medicare Other | Admitting: Pulmonary Disease

## 2011-04-01 HISTORY — PX: SPINAL CORD STIMULATOR IMPLANT: SHX2422

## 2011-04-15 ENCOUNTER — Ambulatory Visit: Payer: Medicare Other | Admitting: Pulmonary Disease

## 2012-06-03 ENCOUNTER — Other Ambulatory Visit: Payer: Self-pay | Admitting: Orthopedic Surgery

## 2012-06-07 ENCOUNTER — Other Ambulatory Visit: Payer: Self-pay | Admitting: Orthopedic Surgery

## 2012-06-07 DIAGNOSIS — M549 Dorsalgia, unspecified: Secondary | ICD-10-CM

## 2012-06-15 ENCOUNTER — Other Ambulatory Visit: Payer: Medicare Other

## 2012-06-16 ENCOUNTER — Inpatient Hospital Stay
Admission: RE | Admit: 2012-06-16 | Discharge: 2012-06-16 | Disposition: A | Discharge status: Home / Self Care | Payer: Medicare Other | Source: Ambulatory Visit | Attending: Orthopedic Surgery | Admitting: Orthopedic Surgery

## 2012-06-16 ENCOUNTER — Other Ambulatory Visit: Payer: Medicare Other

## 2012-06-21 ENCOUNTER — Other Ambulatory Visit: Payer: Medicare Other

## 2012-06-23 ENCOUNTER — Ambulatory Visit
Admission: RE | Admit: 2012-06-23 | Discharge: 2012-06-23 | Disposition: A | Discharge status: Home / Self Care | Payer: Medicare Other | Source: Ambulatory Visit | Attending: Orthopedic Surgery | Admitting: Orthopedic Surgery

## 2012-06-23 VITALS — BP 107/54 | HR 91

## 2012-06-23 DIAGNOSIS — M549 Dorsalgia, unspecified: Secondary | ICD-10-CM

## 2012-06-23 MED ORDER — DIAZEPAM 5 MG PO TABS
10.0000 mg | ORAL_TABLET | Freq: Once | ORAL | Status: AC
  Administered 2012-06-23: 10 mg via ORAL

## 2012-06-23 MED ORDER — HYDROMORPHONE HCL PF 2 MG/ML IJ SOLN
2.0000 mg | Freq: Once | INTRAMUSCULAR | Status: AC
  Administered 2012-06-23: 2 mg via INTRAMUSCULAR

## 2012-06-23 MED ORDER — ONDANSETRON HCL 4 MG/2ML IJ SOLN
4.0000 mg | Freq: Once | INTRAMUSCULAR | Status: AC
  Administered 2012-06-23: 4 mg via INTRAMUSCULAR

## 2012-06-23 MED ORDER — IOHEXOL 180 MG/ML  SOLN
20.0000 mL | Freq: Once | INTRAMUSCULAR | Status: AC | PRN
  Administered 2012-06-23: 20 mL via INTRATHECAL

## 2012-06-23 NOTE — Progress Notes (Signed)
Patient states she has been off Trazadone and Effexor for at least the past two days.  Donell Sievert, RN

## 2013-06-09 ENCOUNTER — Other Ambulatory Visit: Payer: Self-pay | Admitting: Family Medicine

## 2013-06-09 ENCOUNTER — Other Ambulatory Visit (HOSPITAL_COMMUNITY)
Admission: RE | Admit: 2013-06-09 | Discharge: 2013-06-09 | Disposition: A | Discharge status: Home / Self Care | Payer: Medicare Other | Source: Ambulatory Visit | Attending: Family Medicine | Admitting: Family Medicine

## 2013-06-09 DIAGNOSIS — Z1151 Encounter for screening for human papillomavirus (HPV): Secondary | ICD-10-CM | POA: Insufficient documentation

## 2013-06-09 DIAGNOSIS — Z124 Encounter for screening for malignant neoplasm of cervix: Secondary | ICD-10-CM | POA: Insufficient documentation

## 2013-09-07 ENCOUNTER — Other Ambulatory Visit: Payer: Self-pay | Admitting: Gastroenterology

## 2013-10-18 ENCOUNTER — Ambulatory Visit: Payer: Self-pay

## 2013-12-19 ENCOUNTER — Emergency Department (HOSPITAL_COMMUNITY): Payer: Medicare Other

## 2013-12-19 ENCOUNTER — Encounter (HOSPITAL_COMMUNITY): Payer: Self-pay | Admitting: Emergency Medicine

## 2013-12-19 ENCOUNTER — Emergency Department (HOSPITAL_COMMUNITY)
Admission: EM | Admit: 2013-12-19 | Discharge: 2013-12-20 | Disposition: A | Discharge status: Home / Self Care | Payer: Medicare Other | Attending: Emergency Medicine | Admitting: Emergency Medicine

## 2013-12-19 DIAGNOSIS — Z79899 Other long term (current) drug therapy: Secondary | ICD-10-CM | POA: Diagnosis not present

## 2013-12-19 DIAGNOSIS — F17200 Nicotine dependence, unspecified, uncomplicated: Secondary | ICD-10-CM | POA: Diagnosis not present

## 2013-12-19 DIAGNOSIS — G8929 Other chronic pain: Secondary | ICD-10-CM | POA: Diagnosis not present

## 2013-12-19 DIAGNOSIS — E7800 Pure hypercholesterolemia, unspecified: Secondary | ICD-10-CM | POA: Insufficient documentation

## 2013-12-19 DIAGNOSIS — T400X1A Poisoning by opium, accidental (unintentional), initial encounter: Secondary | ICD-10-CM | POA: Insufficient documentation

## 2013-12-19 DIAGNOSIS — Z86718 Personal history of other venous thrombosis and embolism: Secondary | ICD-10-CM | POA: Diagnosis not present

## 2013-12-19 DIAGNOSIS — I10 Essential (primary) hypertension: Secondary | ICD-10-CM | POA: Insufficient documentation

## 2013-12-19 DIAGNOSIS — IMO0001 Reserved for inherently not codable concepts without codable children: Secondary | ICD-10-CM | POA: Insufficient documentation

## 2013-12-19 DIAGNOSIS — F329 Major depressive disorder, single episode, unspecified: Secondary | ICD-10-CM | POA: Diagnosis not present

## 2013-12-19 DIAGNOSIS — Y9289 Other specified places as the place of occurrence of the external cause: Secondary | ICD-10-CM | POA: Diagnosis not present

## 2013-12-19 DIAGNOSIS — R011 Cardiac murmur, unspecified: Secondary | ICD-10-CM | POA: Diagnosis not present

## 2013-12-19 DIAGNOSIS — M810 Age-related osteoporosis without current pathological fracture: Secondary | ICD-10-CM | POA: Insufficient documentation

## 2013-12-19 DIAGNOSIS — Z880 Allergy status to penicillin: Secondary | ICD-10-CM | POA: Insufficient documentation

## 2013-12-19 DIAGNOSIS — IMO0002 Reserved for concepts with insufficient information to code with codable children: Secondary | ICD-10-CM | POA: Insufficient documentation

## 2013-12-19 DIAGNOSIS — Y9389 Activity, other specified: Secondary | ICD-10-CM | POA: Insufficient documentation

## 2013-12-19 DIAGNOSIS — T40601A Poisoning by unspecified narcotics, accidental (unintentional), initial encounter: Secondary | ICD-10-CM

## 2013-12-19 DIAGNOSIS — Z91040 Latex allergy status: Secondary | ICD-10-CM | POA: Insufficient documentation

## 2013-12-19 DIAGNOSIS — F1310 Sedative, hypnotic or anxiolytic abuse, uncomplicated: Secondary | ICD-10-CM | POA: Insufficient documentation

## 2013-12-19 DIAGNOSIS — F3289 Other specified depressive episodes: Secondary | ICD-10-CM | POA: Diagnosis not present

## 2013-12-19 LAB — CBC WITH DIFFERENTIAL/PLATELET
Basophils Absolute: 0.1 10*3/uL (ref 0.0–0.1)
Basophils Relative: 1 % (ref 0–1)
Eosinophils Absolute: 0.4 10*3/uL (ref 0.0–0.7)
Eosinophils Relative: 5 % (ref 0–5)
HCT: 31.8 % — ABNORMAL LOW (ref 36.0–46.0)
Hemoglobin: 10.6 g/dL — ABNORMAL LOW (ref 12.0–15.0)
Lymphocytes Relative: 39 % (ref 12–46)
Lymphs Abs: 2.8 10*3/uL (ref 0.7–4.0)
MCH: 29.7 pg (ref 26.0–34.0)
MCHC: 33.3 g/dL (ref 30.0–36.0)
MCV: 89.1 fL (ref 78.0–100.0)
Monocytes Absolute: 0.6 10*3/uL (ref 0.1–1.0)
Monocytes Relative: 9 % (ref 3–12)
Neutro Abs: 3.5 10*3/uL (ref 1.7–7.7)
Neutrophils Relative %: 48 % (ref 43–77)
Platelets: 219 10*3/uL (ref 150–400)
RBC: 3.57 MIL/uL — ABNORMAL LOW (ref 3.87–5.11)
RDW: 14.2 % (ref 11.5–15.5)
WBC: 7.4 10*3/uL (ref 4.0–10.5)

## 2013-12-19 LAB — COMPREHENSIVE METABOLIC PANEL
ALT: 15 U/L (ref 0–35)
AST: 29 U/L (ref 0–37)
Albumin: 3.7 g/dL (ref 3.5–5.2)
Alkaline Phosphatase: 62 U/L (ref 39–117)
Anion gap: 13 (ref 5–15)
BUN: 14 mg/dL (ref 6–23)
CO2: 25 mEq/L (ref 19–32)
Calcium: 9.7 mg/dL (ref 8.4–10.5)
Chloride: 102 mEq/L (ref 96–112)
Creatinine, Ser: 1.1 mg/dL (ref 0.50–1.10)
GFR calc Af Amer: 60 mL/min — ABNORMAL LOW (ref >90)
GFR calc non Af Amer: 52 mL/min — ABNORMAL LOW (ref >90)
Glucose, Bld: 89 mg/dL (ref 70–99)
Potassium: 4.6 mEq/L (ref 3.7–5.3)
Sodium: 140 mEq/L (ref 137–147)
Total Bilirubin: <0.2 mg/dL — ABNORMAL LOW (ref 0.3–1.2)
Total Protein: 7.4 g/dL (ref 6.0–8.3)

## 2013-12-19 LAB — SALICYLATE LEVEL: Salicylate Lvl: <2 mg/dL — ABNORMAL LOW (ref 2.8–20.0)

## 2013-12-19 LAB — ACETAMINOPHEN LEVEL: Acetaminophen (Tylenol), Serum: <15 ug/mL (ref 10–30)

## 2013-12-19 LAB — ETHANOL: Alcohol, Ethyl (B): <11 mg/dL (ref 0–11)

## 2013-12-19 MED ORDER — NALOXONE HCL 0.4 MG/ML IJ SOLN
0.4000 mg | Freq: Once | INTRAMUSCULAR | Status: AC
  Administered 2013-12-19: 0.4 mg via INTRAVENOUS
  Filled 2013-12-19: qty 1

## 2013-12-19 NOTE — ED Notes (Signed)
Per ems-- pt recently OxyContin. Pt taken this and percocet daily. Pt sts she took her normal amount of medication today. Family reports that she was out in the garden and went unresponsive. Pt helped to chair. Upon fire dept arrival pt was apneic. Fire dept administered 2 of narcan and pt became responsive again. Pt lethargic with ems. 2 min pta ems administered an additional 0.4mg  narcan. Family reports she has had poor PO intake. Pt with hx RSD.

## 2013-12-19 NOTE — ED Notes (Signed)
Informed RN of pt pain. 

## 2013-12-19 NOTE — ED Provider Notes (Signed)
CSN: [id number]     Arrival date & time 12/19/13  1826 History   First MD Initiated Contact with Patient 12/19/13 1828     Chief Complaint  Patient presents with  . Drug Overdose    Patient is a 63 y.o. female presenting with Overdose. The history is provided by the patient and the EMS personnel.  Drug Overdose Pertinent negatives include no abdominal pain, chest pain, chills, coughing, fever, nausea, rash or vomiting.   Chronic pain patient at [facility] with RSD. Pt was in normal state of health this AM when pt says she dropped a pot on her porch " a few hours ago, unsure, maybe 1600."  Husband found patient sitting in chair, sleeping and with confused speech.  Fire was on scene and noted pt to be somnolent with shallow infrequent respirations.  Pin point pupils.  Gave 2mg  IN Narcan with rapid improvement of mental status to baseline.  EMS states pt became somnolent during transport and was given 0.4 IN Narcan again with rapid improvement.  Sister says she is on a new medication for past 2 months ,oxycodone 20 mg ER at which time she stopped fentanyl patch.  VSS stable prior to arrival to ED.  No focal deficits noted besides somnolence.   Family states speech is not slurred, only slow and this is normal.   Past Medical History  Diagnosis Date  . HTN (hypertension)   . Hypercholesterolemia   . Reflex sympathetic dystrophy   . Headache   . Fibromyalgia   . Osteoporosis   . Mitral valve prolapse   . DVT (deep vein thrombosis) in pregnancy   . Depression   . Hyperparathyroidism    Past Surgical History  Procedure Laterality Date  . Foot surgery      bilateral  . Carpel tunnel releaseother]      bilateral  . 2 back surgeries    . Parathyroid surgery    . Left wrist surgery    . Tonsillectomy     Family History  Problem Relation Age of Onset  . Heart murmur Mother   . Heart murmur Sister     x2  . Cancer Mother     rare type of liver cancer   History  Substance Use Topics  .  Smoking status: Current Every Day Smoker -- 1.00 packs/day for 25 years  . Smokeless tobacco: Not on file  . Alcohol Use: No   OB History   Grav Para Term Preterm Abortions TAB SAB Ect Mult Living                 Review of Systems  Constitutional: Negative for fever and chills.  Respiratory: Negative for cough, shortness of breath and wheezing.   Cardiovascular: Negative for chest pain.  Gastrointestinal: Negative for nausea, vomiting and abdominal pain.  Musculoskeletal: Negative for back pain.  Skin: Negative for rash.  All other systems reviewed and are negative.   Allergies  Latex; Penicillins; and Sulfa antibiotics  Home Medications   Prior to Admission medications   Medication Sig Start Date End Date Taking? Authorizing Provider  alendronate (FOSAMAX) 70 MG tablet Take 70 mg by mouth every 7 (seven) days. Take with a full glass of water on an empty stomach.     Historical Provider, MD  ALPRAZolam Duanne Moron) 0.5 MG tablet Take 0.5 mg by mouth 2 (two) times daily as needed.      Historical Provider, MD  ascorbic acid (VITAMIN C) 1000 MG tablet Take 1,000  mg by mouth daily.      Historical Provider, MD  calcium carbonate (OS-CAL) 600 MG TABS Take 600 mg by mouth daily.      Historical Provider, MD  ezetimibe (ZETIA) 10 MG tablet Take 10 mg by mouth daily.      Historical Provider, MD  fentaNYL (DURAGESIC) 50 MCG/HR Place 1 patch onto the skin every 3 (three) days.      Historical Provider, MD  fish oil-omega-3 fatty acids 1000 MG capsule 3 capsules once a day     Historical Provider, MD  Flaxseed, Linseed, (FLAX SEED OIL) 1000 MG CAPS 3 capsules once a day     Historical Provider, MD  furosemide (LASIX) 80 MG tablet Take 80 mg by mouth daily.      Historical Provider, MD  gabapentin (NEURONTIN) 600 MG tablet Take 600 mg by mouth 3 (three) times daily.      Historical Provider, MD  lidocaine (LIDODERM) 5 % Place 1 patch onto the skin daily. Remove & Discard patch within 12 hours or  as directed by MD     Historical Provider, MD  meclizine (ANTIVERT) 25 MG tablet Take 25 mg by mouth 4 (four) times daily as needed.     Historical Provider, MD  Multiple Vitamins-Minerals (CENTRUM PO) Once a day     Historical Provider, MD  NIFEdipine (PROCARDIA) 10 MG capsule Take 10 mg by mouth 3 (three) times daily.      Historical Provider, MD  oxyCODONE-acetaminophen (PERCOCET) 5-325 MG per tablet Take 1 tablet by mouth every 6 (six) hours as needed.      Historical Provider, MD  oxymetazoline (AFRIN) 0.05 % nasal spray Place 2 sprays into the nose 2 (two) times daily as needed.      Historical Provider, MD  potassium chloride (KLOR-CON) 10 MEQ CR tablet Take 10 mEq by mouth daily.      Historical Provider, MD  ramipril (ALTACE) 10 MG tablet Take 10 mg by mouth daily.      Historical Provider, MD  rosuvastatin (CRESTOR) 20 MG tablet Take 20 mg by mouth daily.      Historical Provider, MD  tiZANidine (ZANAFLEX) 4 MG tablet 1 tablet up to 2 times a day as needed     Historical Provider, MD  traZODone (DESYREL) 100 MG tablet 3 tablets at bedtime as needed     Historical Provider, MD  triamcinolone (NASACORT AQ) 55 MCG/ACT nasal inhaler Place 2 sprays into the nose daily as needed.      Historical Provider, MD  venlafaxine (EFFEXOR-XR) 150 MG 24 hr capsule Take 150 mg by mouth daily.      Historical Provider, MD   BP 140/78  Pulse 78  Temp(Src) 97.7 F (36.5 C) (Oral)  Resp 14  Ht 5\' 3"  (1.6 m)  Wt 146 lb (66.225 kg)  BMI 25.87 kg/m2  SpO2 97% Physical Exam  Nursing note and vitals reviewed. Constitutional: She appears well-developed and well-nourished. No distress.  HENT:  Head: Normocephalic and atraumatic.  Nose: Nose normal.  Eyes: Conjunctivae are normal. No scleral icterus.  Pupils 4 mm equal reactive  Neck: Normal range of motion. Neck supple. No tracheal deviation present.  Cardiovascular: Normal rate and regular rhythm.   Murmur (2/6 SEM) heard. Pulmonary/Chest: Effort  normal and breath sounds normal. No respiratory distress. She has no rales.  Abdominal: Soft. Bowel sounds are normal. She exhibits no distension and no mass. There is no tenderness.  Musculoskeletal: Normal range of motion. She  exhibits no edema.  Neurological:  Alert and oriented x3. CN 3-12 tested and without deficit. 5/5 muscle strength in all extremities with flexion and extension.  Normal bulk and tone.  No sensory deficit to light touch.  Tandem gait normal. Symmetric and equal 2+ patellar and brachioradialis DTRs.  No pronator drift.  Normal heel-to-shin and finger-to-nose.  Toes flexor bilaterally.   Skin: Skin is warm and dry. No rash noted.  No patches. No jaundice  Psychiatric:  Speech is slurred (family says this is not new)    ED Course  Procedures (including critical care time) Labs Review Labs Reviewed  CBC WITH DIFFERENTIAL - Abnormal; Notable for the following:    RBC 3.57 (*)    Hemoglobin 10.6 (*)    HCT 31.8 (*)    All other components within normal limits  COMPREHENSIVE METABOLIC PANEL - Abnormal; Notable for the following:    Total Bilirubin <0.2 (*)    GFR calc non Af Amer 52 (*)    GFR calc Af Amer 60 (*)    All other components within normal limits  URINE RAPID DRUG SCREEN (HOSP PERFORMED) - Abnormal; Notable for the following:    Benzodiazepines POSITIVE (*)    All other components within normal limits  SALICYLATE LEVEL - Abnormal; Notable for the following:    Salicylate Lvl <4.6 (*)    All other components within normal limits  ETHANOL  ACETAMINOPHEN LEVEL    Imaging Review Ct Head Wo Contrast  12/19/2013   CLINICAL DATA:  Unresponsive, headache, vertigo  EXAM: CT HEAD WITHOUT CONTRAST  TECHNIQUE: Contiguous axial images were obtained from the base of the skull through the vertex without intravenous contrast.  COMPARISON:  04/21/2003  FINDINGS: No evidence of parenchymal hemorrhage or extra-axial fluid collection. No mass lesion, mass effect, or  midline shift.  No CT evidence of acute infarction.  Mild small vessel ischemic changes.  Intracranial atherosclerosis.  Cerebral volume is within normal limits.  No ventriculomegaly.  The visualized paranasal sinuses are essentially clear. The mastoid air cells are unopacified.  No evidence of calvarial fracture.  IMPRESSION: No evidence of acute intracranial abnormality.  Mild small vessel ischemic changes with intracranial atherosclerosis.   Electronically Signed   By: Julian Hy M.D.   On: 12/19/2013 22:27     EKG Interpretation None      MDM   Final diagnoses:  Opiate overdose, accidental or unintentional, initial encounter  Chronic pain   Medications  naloxone (NARCAN) injection 0.4 mg (0.4 mg Intravenous Given 12/19/13 2122)    Pt presents for suspected accidental oxycodone drug overdose.  Somnolence and relative apnea per EMS on scene which rapidly reversed with Narcan.    Normal neuro exam, no recent falls, doubt ICH, defer CT head.  Primary diagnosis more likely.    Monitoring of patient continuously throughout ED stay with somnolence noted early in course. Narcan given at 2122.     CT head negative. Labs unremarkable   10:43 PM No somnolence noted. Speech less slurred.  Feeling better, "thinking more clearly". Pupils normal, equal.  CT head negative. Pt says probably has been 6 hours since she first felt sleepy.    12:34 AM Pt alert. Well appearing.  Participates fully in medical care.  Pt denies intentional overdose. She is unsure why this happened but it has happened previously.  Pt was observed for > 6 hours after onset of somnolence, greater than half life of oxy ER.  Recommended pt discontinue oxycodone until  she speaks with pain specialist.  She has about 1 weeks supply of percocet at home and will use for breakthrough pain.  Pt is agreeable with this disposition plan.     Tammy Sours, MD 12/20/13 [phone]

## 2013-12-19 NOTE — ED Notes (Signed)
Informed Dr. Jeneen Rinks of pt pain

## 2013-12-19 NOTE — ED Notes (Signed)
Patient transported to CT 

## 2013-12-19 NOTE — ED Notes (Signed)
Pt became more alert after Narcan given; Pt speech cleared somewhat; eyes no longer have "glassed over" look.  Pt does not seem as drowsy able to answer question without falling asleep; pt request to stay on bed pan a little longer to try to urinate.

## 2013-12-19 NOTE — ED Notes (Signed)
Pt's family states pt has trouble urinating but does not want I/O until she tries on her own; pt placed on bedpan to see if she can urinate on her own; Md will be notified if no urine collected.

## 2013-12-20 LAB — RAPID URINE DRUG SCREEN, HOSP PERFORMED
Amphetamines: NOT DETECTED
Barbiturates: NOT DETECTED
Benzodiazepines: POSITIVE — AB
Cocaine: NOT DETECTED
Opiates: NOT DETECTED
Tetrahydrocannabinol: NOT DETECTED

## 2013-12-20 NOTE — ED Notes (Signed)
Pt insisted she walked to restroom; RN tried to convince pt to use bedpan due to fall risk; pt refused; family agreed to walk with pt to restroom; pt used cane with family member at side to walk to restroom

## 2013-12-20 NOTE — ED Notes (Signed)
Gave pt Coke (drink) per [employer]

## 2013-12-20 NOTE — ED Provider Notes (Signed)
Pt seen and examined.  D/W Dr. Garner Gavel.  Given narcan by EMS intranasal, and given IV narcan here.  Now 3 hours s/p Narcan, and awake and alert.  Pt c/o chronic pain, but is quite reasonable about holding meds at this time.    I think after an additional hour of observation status remains where his nail should be perfectly appropriate for outpatient treatment. Discussed with her that like her to not take her oxycontin and to simply use her Percocet for pain control, and to talk with her pain management physician tomorrow.  Tanna Furry, MD 12/20/13 [phone]

## 2013-12-20 NOTE — Discharge Instructions (Signed)
Stop taking Oxycodone until you speak with your pain specialist. Call in AM.  You may take percocet for breakthrough pain.

## 2013-12-22 NOTE — ED Provider Notes (Signed)
I saw and evaluated the patient, reviewed the resident's note and I agree with the findings and plan.   EKG Interpretation None          Tanna Furry, MD 12/22/13 1105

## 2014-08-29 ENCOUNTER — Other Ambulatory Visit: Payer: Self-pay | Admitting: Radiology

## 2014-08-31 ENCOUNTER — Telehealth: Payer: Self-pay | Admitting: *Deleted

## 2014-08-31 NOTE — Telephone Encounter (Signed)
Left message for a return phone call to schedule for [facility] 09/06/14.

## 2014-09-04 ENCOUNTER — Telehealth: Payer: Self-pay | Admitting: *Deleted

## 2014-09-04 DIAGNOSIS — D0511 Intraductal carcinoma in situ of right breast: Secondary | ICD-10-CM | POA: Insufficient documentation

## 2014-09-04 DIAGNOSIS — C50411 Malignant neoplasm of upper-outer quadrant of right female breast: Secondary | ICD-10-CM

## 2014-09-04 NOTE — Telephone Encounter (Signed)
Confirmed [facility] for 09/06/14 at 8am .  Instructions and contact information given.

## 2014-09-04 NOTE — Telephone Encounter (Signed)
Left second message to schedule patient for [facility] 09/06/14.  Awaiting patient response.

## 2014-09-06 ENCOUNTER — Ambulatory Visit
Admission: RE | Admit: 2014-09-06 | Discharge: 2014-09-06 | Disposition: A | Discharge status: Home / Self Care | Payer: Medicare Other | Source: Ambulatory Visit | Attending: Radiation Oncology | Admitting: Radiation Oncology

## 2014-09-06 ENCOUNTER — Encounter: Payer: Self-pay | Admitting: *Deleted

## 2014-09-06 ENCOUNTER — Ambulatory Visit: Payer: PPO

## 2014-09-06 ENCOUNTER — Encounter: Payer: Self-pay | Admitting: Hematology

## 2014-09-06 ENCOUNTER — Ambulatory Visit (HOSPITAL_BASED_OUTPATIENT_CLINIC_OR_DEPARTMENT_OTHER): Payer: PPO | Admitting: Hematology

## 2014-09-06 ENCOUNTER — Encounter: Payer: Self-pay | Admitting: Skilled Nursing Facility1

## 2014-09-06 ENCOUNTER — Ambulatory Visit: Payer: PPO | Attending: General Surgery | Admitting: Physical Therapy

## 2014-09-06 ENCOUNTER — Other Ambulatory Visit: Payer: Self-pay | Admitting: General Surgery

## 2014-09-06 ENCOUNTER — Other Ambulatory Visit (HOSPITAL_BASED_OUTPATIENT_CLINIC_OR_DEPARTMENT_OTHER): Payer: PPO

## 2014-09-06 ENCOUNTER — Encounter: Payer: Self-pay | Admitting: Physical Therapy

## 2014-09-06 VITALS — BP 130/64 | HR 98 | Temp 98.2°F | Resp 18 | Ht 63.0 in | Wt 154.7 lb

## 2014-09-06 DIAGNOSIS — C50411 Malignant neoplasm of upper-outer quadrant of right female breast: Secondary | ICD-10-CM | POA: Diagnosis not present

## 2014-09-06 DIAGNOSIS — M8580 Other specified disorders of bone density and structure, unspecified site: Secondary | ICD-10-CM | POA: Diagnosis not present

## 2014-09-06 DIAGNOSIS — R293 Abnormal posture: Secondary | ICD-10-CM

## 2014-09-06 DIAGNOSIS — D0511 Intraductal carcinoma in situ of right breast: Secondary | ICD-10-CM

## 2014-09-06 LAB — CBC WITH DIFFERENTIAL/PLATELET
BASO%: 0.6 % (ref 0.0–2.0)
Basophils Absolute: 0.1 10*3/uL (ref 0.0–0.1)
EOS%: 3.9 % (ref 0.0–7.0)
Eosinophils Absolute: 0.4 10*3/uL (ref 0.0–0.5)
HCT: 34.8 % (ref 34.8–46.6)
HGB: 11.4 g/dL — ABNORMAL LOW (ref 11.6–15.9)
LYMPH%: 44.5 % (ref 14.0–49.7)
MCH: 30.2 pg (ref 25.1–34.0)
MCHC: 32.8 g/dL (ref 31.5–36.0)
MCV: 92.3 fL (ref 79.5–101.0)
MONO#: 0.8 10*3/uL (ref 0.1–0.9)
MONO%: 7.3 % (ref 0.0–14.0)
NEUT#: 4.7 10*3/uL (ref 1.5–6.5)
NEUT%: 43.7 % (ref 38.4–76.8)
Platelets: 217 10*3/uL (ref 145–400)
RBC: 3.77 10*6/uL (ref 3.70–5.45)
RDW: 13.8 % (ref 11.2–14.5)
WBC: 10.7 10*3/uL — ABNORMAL HIGH (ref 3.9–10.3)
lymph#: 4.8 10*3/uL — ABNORMAL HIGH (ref 0.9–3.3)

## 2014-09-06 LAB — COMPREHENSIVE METABOLIC PANEL (CC13)
ALT: 16 U/L (ref 0–55)
AST: 25 U/L (ref 5–34)
Albumin: 4 g/dL (ref 3.5–5.0)
Alkaline Phosphatase: 65 U/L (ref 40–150)
Anion Gap: 10 mEq/L (ref 3–11)
BUN: 19.1 mg/dL (ref 7.0–26.0)
CO2: 27 mEq/L (ref 22–29)
Calcium: 10.5 mg/dL — ABNORMAL HIGH (ref 8.4–10.4)
Chloride: 106 mEq/L (ref 98–109)
Creatinine: 1.5 mg/dL — ABNORMAL HIGH (ref 0.6–1.1)
EGFR: 37 mL/min/{1.73_m2} — ABNORMAL LOW (ref >90)
Glucose: 98 mg/dl (ref 70–140)
Potassium: 3.7 mEq/L (ref 3.5–5.1)
Sodium: 143 mEq/L (ref 136–145)
Total Bilirubin: 0.24 mg/dL (ref 0.20–1.20)
Total Protein: 7.2 g/dL (ref 6.4–8.3)

## 2014-09-06 NOTE — Progress Notes (Signed)
Ms. Connie Bennett is a very pleasant 64 y.o. female from [place], [place] with newly diagnosed grade 3 DCIS of the right breast.  Biopsy results revealed the tumor's prognostic profile is ER negative and PR negative.  She presents today with her husband and friend to the [facility]) for treatment consideration and recommendations from the breast surgeon, radiation oncologist, and medical oncologist.     I briefly met with Ms. Connie Bennett and her family during her [facility] visit today. We discussed the purpose of the Survivorship Clinic, which will include monitoring for recurrence, coordinating completion of age and gender-appropriate cancer screenings, promotion of overall wellness, as well as managing potential late/long-term side effects of anti-cancer treatments.    The treatment plan for Ms. Connie Bennett will likely include surgery and radiation therapy.  As of today, the intent of treatment for Ms. Connie Bennett is cure, therefore she will be eligible for the Survivorship Clinic upon her completion of treatment.  Her survivorship care plan (SCP) document will be drafted and updated throughout the course of her treatment trajectory. She will receive the SCP in an office visit with myself in the Survivorship Clinic once she has completed treatment.   Ms. Connie Bennett was encouraged to ask questions and all questions were answered to her satisfaction.  She was given my business card and encouraged to contact me with any concerns regarding survivorship.  I look forward to participating in her care.   Mike Craze, NP [place] [phone]

## 2014-09-06 NOTE — Progress Notes (Signed)
Subjective:     Patient ID: Connie Bennett, female   DOB: 11-25-1950, 64 y.o.   MRN: [id number]  HPI   Review of Systems     Objective:   Physical Exam For the patient to understand and be given the tools to implement a healthy plant based diet during their cancer diagnosis.     Assessment:     Patient was seen today and found to be in good spirits and was accompanied by her seemingly supportive husband and sister..Pts current/relevant medications: vitamin C, calcium, fish oil, flax seed oil, crestor, lasix. Pts weight 154 pounds 5'3'' BMI 27.5. Pts relevant labs: Cr 1.5, calcium 10.5, GFR 37, WBC 10.7, HGB 11.4. Physician has asked to discontinue flax seed oil and fish oil (09/06/2014). Pt states she is taking vitamin C because her dentist told her many years ago that she needed to take it. Pt states she has difficulty walking.       Plan:     Dietitian educated the patient on implementing a plant based diet by incorporating more plant proteins, fruits, and vegetables. As a part of a healthy routine physical activity was discussed. Dietitian advised she continue to take her supplements if her physician states she has higher needs and educated the pt on supplements and vitamin C and calcium specifically. Dietitian stated there are little issues with taking vitamin C beyond the AI naming nausea/vomiting/stomach discomfort as possible complications. Dietitian advised she discontinue her calcium supplement due to her calcium labs and decreased kidney function identified by her GFR. The importance of legitimate, evidence based information was discussed and examples were given. A folder of evidence based information with a focus on a plant based diet and general nutrition during cancer was given to the patient.  As a part of the continuum of care the cancer dietitian's contact information was given to the patient in the event they would like to have a follow up appointment.

## 2014-09-06 NOTE — Progress Notes (Signed)
Clinical Social Work [place] Psychosocial Distress Screening [place]  Patient completed distress screening protocol and scored a 10 on the Psychosocial Distress Thermometer which indicates severe distress. Clinical Social Worker met with patient, patient's husband,and friend in [facility] to assess for distress and other psychosocial needs. Patient stated that although she was feeling overwhelmed she felt "better" after meeting with the treatment team and getting information on her treatment plan. CSW and patient discussed common feeling and emotions when being diagnosed with cancer, and the importance of support during treatment. CSW informed patient of the support team and support services at [facility]. CSW provided contact information and encouraged patient to call with any questions or concerns.  ON[facility] DISTRESS SCREENING 09/06/2014  Screening Type Initial Screening  Distress experienced in past week (1-10) 10  Practical problem type Housing;Insurance  Emotional problem type Depression;Nervousness/Anxiety;Adjusting to illness;Isolation/feeling alone;Feeling hopeless  Spiritual/Religous concerns type Loss of sense of purpose  Information Concerns Type Lack of info about diagnosis;Lack of info about treatment;Lack of info about complementary therapy choices;Lack of info about maintaining fitness  Physical Problem type Pain;Sleep/insomnia;Loss of appetitie;Tingling hands/feet;Skin dry/itchy;Swollen arms/legs  Physician notified of physical symptoms Yes  Referral to clinical psychology No  Referral to clinical social work Yes  Referral to dietition No  Referral to financial advocate No  Referral to support programs No  Referral to palliative care No   Johnnye Lana, MSW, LCSW, OSW-C Clinical Social Worker [place] [phone]

## 2014-09-06 NOTE — Patient Instructions (Signed)

## 2014-09-06 NOTE — Therapy (Signed)
[place], [place], [address] Phone: [phone]   Fax:  [phone]  Physical Therapy Evaluation  Patient Details  Name: Connie Bennett MRN: [id number] Date of Birth: 07/11/1950 Referring Provider:  [name], MD  Encounter Date: 09/06/2014      PT End of Session - 09/06/14 1035    Visit Number 1   Number of Visits 1   PT Start Time 0950   PT Stop Time 1015   PT Time Calculation (min) 25 min   Activity Tolerance Patient tolerated treatment well   Behavior During Therapy [facility] for tasks assessed/performed      Past Medical History  Diagnosis Date  . HTN (hypertension)   . Hypercholesterolemia   . Reflex sympathetic dystrophy   . Headache(784.0)   . Fibromyalgia   . Osteoporosis   . Mitral valve prolapse   . DVT (deep vein thrombosis) in pregnancy   . Depression   . Hyperparathyroidism   . Fibromyalgia   . Chronic fatigue syndrome   . Osteoporosis   . RSD (reflex sympathetic dystrophy)   . Arthritis   . Anxiety     Past Surgical History  Procedure Laterality Date  . Foot surgery      bilateral  . Carpel tunnel releaseother]      bilateral  . 2 back surgeries    . Parathyroid surgery    . Left wrist surgery    . Tonsillectomy      There were no vitals filed for this visit.  Visit Diagnosis:  Abnormal posture - Plan: PT plan of care cert/re-cert  Carcinoma of upper-outer quadrant of right female breast - Plan: PT plan of care cert/re-cert      Subjective Assessment - 09/06/14 1017    Subjective Patient is being seen today for a baseline assessment of her newly diagnosed right breast cancer.   Patient is accompained by: Family member   Pertinent History Patient was diagnosed 08/23/14 with right breast cancer which is ER/PR negative high grade DCIS with suspicion for microinvasive disease. It is located in the right upper outer quadrant and there are 2 small masses 1.5 cm  apart measuring 5 and 7 mm.   Patient Stated Goals Reduce lymphedema risk and learn post op shoulder ROM HEP   Currently in Pain? Yes   Pain Score 10-Worst pain ever   Pain Location Other (Comment)  "All over but worse in hips and back"   Pain Orientation Other (Comment)  "All over"   Pain Descriptors / Indicators Constant   Pain Type Chronic pain   Pain Radiating Towards Patient reports her fibromyalgia and chroinc pain radiate "everywhere"   Pain Onset Other (comment)  "Many years ago"   Pain Frequency Constant   Aggravating Factors  Not taking pain medication   Pain Relieving Factors Taking pain medication   Effect of Pain on Daily Activities Limits her with all daily tasks and uses single point cane for ambulation due to severe hip pain            [facility] PT Assessment - 09/06/14 0001    Assessment   Medical Diagnosis Right breast cancer   Onset Date/Surgical Date 08/23/14   Hand Dominance Right   Precautions   Precautions Fall;Other (comment)  Active breast cancer   Restrictions   Weight Bearing Restrictions No   Balance Screen   Has the patient fallen in the past 6 months No  But uses [facility] and has  had falls in the past   Has the patient had a decrease in activity level because of a fear of falling?  No   Is the patient reluctant to leave their home because of a fear of falling?  No  She does not want this addressed due to [place] residence   Living Arrangements Spouse/significant other   Available Help at Discharge Family   Prior Function   Level of [place] for independence;Independent with basic ADLs  Ambulates with single point cane   Vocation Retired   Leisure She does not exercise  Discussed various modified exercise options she could do   Cognition   Overall Cognitive Status Within Functional Limits for tasks assessed   Posture/Postural Control   Posture/Postural Control Postural  limitations   Postural Limitations Rounded Shoulders;Forward head;Increased thoracic kyphosis   ROM / Strength   AROM / PROM / Strength AROM;Strength   AROM   AROM Assessment Site Shoulder   Right/Left Shoulder Right;Left   Right Shoulder Extension 43 Degrees   Right Shoulder Flexion 135 Degrees   Right Shoulder ABduction 143 Degrees   Right Shoulder Internal Rotation 42 Degrees   Right Shoulder External Rotation 53 Degrees   Left Shoulder Extension 60 Degrees   Left Shoulder Flexion 135 Degrees   Left Shoulder ABduction 150 Degrees   Left Shoulder Internal Rotation 46 Degrees   Left Shoulder External Rotation 74 Degrees   Strength   Overall Strength Unable to assess   Overall Strength Comments Not assessed due to severe chronic joint pain           LYMPHEDEMA/ONCOLOGY QUESTIONNAIRE - 09/06/14 1028    Type   Cancer Type Right breast   Lymphedema Assessments   Lymphedema Assessments Upper extremities   Right Upper Extremity Lymphedema   10 cm Proximal to Olecranon Process 27.9 cm   Olecranon Process 26 cm   10 cm Proximal to Ulnar Styloid Process 22.4 cm   Just Proximal to Ulnar Styloid Process 15.6 cm   Across Hand at [employer] 19.3 cm   At [place] of 2nd Digit 6.5 cm   Left Upper Extremity Lymphedema   10 cm Proximal to Olecranon Process 28 cm   Olecranon Process 25 cm   10 cm Proximal to Ulnar Styloid Process 21.4 cm   Just Proximal to Ulnar Styloid Process 15.8 cm   Across Hand at [employer] 18.9 cm   At [place] of 2nd Digit 6.3 cm      Patient was instructed today in a home exercise program today for post op shoulder range of motion. These included active assist shoulder flexion in sitting, scapular retraction, wall walking with shoulder abduction, and hands behind head external rotation.  She was encouraged to do these twice a day, holding 3 seconds and repeating 5 times when permitted by her physician.         PT Education - 09/06/14 1031    Education  provided Yes   Education Details Post op shoulder ROM HEP and lymphedema risk reduction   Person(s) Educated Patient   Methods Explanation;Demonstration;Handout   Comprehension Verbalized understanding;Returned demonstration              Breast Clinic Goals - 09/06/14 1039    Patient will be able to verbalize understanding of pertinent lymphedema risk reduction practices relevant to her diagnosis specifically related to skin care.   Time 1   Period Days  Status Achieved   Patient will be able to return demonstrate and/or verbalize understanding of the post-op home exercise program related to regaining shoulder range of motion.   Time 1   Period Days   Status Achieved   Patient will be able to verbalize understanding of the importance of attending the postoperative After Breast Cancer Class for further lymphedema risk reduction education and therapeutic exercise.   Time 1   Period Days   Status Achieved              Plan - 15-Sep-2014 1035    Clinical Impression Statement Patient was diagnosed 08/23/14 with right breast cancer which is ER/PR negative high grade DCIS with suspicion for microinvasive disease. It is located in the right upper outer quadrant and there are 2 small masses 1.5 cm apart measuring 5 and 7 mm.  She is planning to have a right lumpectomy and sentinel node biopsy followed by radiation.  She may benefit from physical therapy post op to regain shoulder ROM and strength and prevent lympehdema.   Pt will benefit from skilled therapeutic intervention in order to improve on the following deficits Decreased range of motion;Pain;Decreased strength;Decreased knowledge of precautions   Rehab Potential Excellent   Clinical Impairments Affecting Rehab Potential Fibromyalgia / chronic pain   PT Frequency One time visit   PT Treatment/Interventions Patient/family education;Therapeutic exercise   Consulted and Agree with Plan of Care Patient;Family member/caregiver    Family Member Consulted Husband and friend     Patient will follow up at outpatient cancer rehab if needed following surgery.  If the patient requires physical therapy at that time, a specific plan will be dictated and sent to the referring physician for approval. The patient was educated today on appropriate basic range of motion exercises to begin post operatively and the importance of attending the After Breast Cancer class following surgery.  Patient was educated today on lymphedema risk reduction practices as it pertains to recommendations that will benefit the patient immediately following surgery.  She verbalized good understanding.  No additional physical therapy is indicated at this time.         G-Codes - 15-Sep-2014 1039    Functional Assessment Tool Used Clinical Judgement   Functional Limitation Self care   Self Care Current Status [phone]) At least 20 percent but less than 40 percent impaired, limited or restricted   Self Care Goal Status (D3267) At least 20 percent but less than 40 percent impaired, limited or restricted   Self Care Discharge Status [phone]) At least 20 percent but less than 40 percent impaired, limited or restricted       Problem List Patient Active Problem List   Diagnosis Date Noted  . Breast cancer of upper-outer quadrant of right female breast 09/04/2014  . Complex sleep apnea syndrome 11/14/2010    Annia Friendly, PT 2014-09-15, 10:42 AM  [place], [place], [address] Phone: [phone]   Fax:  [phone]

## 2014-09-06 NOTE — Progress Notes (Signed)
Radiation Oncology         [phone]) [phone] ________________________________  Initial outpatient Consultation - Date: 09/06/2014   Name: Connie Bennett MRN: [id number]   DOB: 11-Jan-1951  REFERRING PHYSICIAN: [name], MD  DIAGNOSIS AND STAGE: Breast cancer of upper-outer quadrant of right female breast   Staging form: Breast, AJCC 7th Edition     Clinical: Stage 0 (Tis (DCIS), N0, M0) - Unsigned  HISTORY OF PRESENT ILLNESS::Connie Bennett is a 64 y.o. female who palpated a right breast mass after a fall. She underwent a mammogram which showed a cyst in this area. Inferior to this was an area of increased density. On ultrasound, this area showed 2 masses measuring 7 mm and 5 mm. These masses were 1.5 cm apart. A biopsy was performed on both masses and both were positive for high grade DCIS with suspicious areas of microinvasion. Both were ER/PR negative. The cyst was aspirated and was benign. She has a mother with liver cancer. She has recovered well from her biopsy.  She is accompanied by her husband and a friend.   PREVIOUS RADIATION THERAPY: No  Past medical, social and family history were reviewed in the electronic chart. Review of symptoms was reviewed in the electronic chart. Medications were reviewed in the electronic chart.  Gynecologic History  Age at first menstrual period? 13  Are you still having periods? No  If you no longer have periods: Have you used hormone replacement? Yes  If YES, for how long? 1 year When did you stop? 15 years ago Obstetric History:  How many children have you carried to term? 1 Your age at first live birth? 85  Pregnant now or trying to get pregnant? No  Have you used birth control pills or hormone shots for contraception? Yes  If so, for how long (or approximate dates)? Does not remember.  Would you be interested in learning more about he options to preserve fertility? No Health Maintenance:  Have you ever had a colonoscopy? Yes If yes, date?  2014  Have you ever had a bone density? Yes If yes, date? 2014  Date of your last PAP smear? 2015 Date of your FIRST mammogram? 76  PHYSICAL EXAM: There were no vitals filed for this visit.. . Pleasant female that appears older than her stated age. No palpable cervical, supraclavicular, or axillary adenopathy. Small amount of biopsy change in right breast. No palpable abnormalities in left breast. Small breasts bilaterally. Alert and oriented times 3.   IMPRESSION: Stage 0 DCIS of the Right Breast with areas suspicious for microinvasion  PLAN: I spoke to the patient today regarding her diagnosis and options for treatment. We discussed the equivalence in terms of survival and local failure between mastectomy and breast conservation. We discussed the role of radiation in decreasing local failures in patients who undergo lumpectomy. We discussed the process of simulation and the placement tattoos. We discussed 4 weeks of treatment as an outpatient. We discussed the possibility of asymptomatic lung damage. We discussed the low likelihood of secondary malignancies. We discussed the possible side effects including but not limited to skin redness, fatigue, permanent skin darkening, and breast swelling.   She is double negative and does have suggestion of microinvasion. She will meet with surgery and medical oncology after her surgery to review her pathology.  She would like to receive her radiation here in [place]  I spent 60 minutes  face to face with the patient and more than 50% of that time  was spent in counseling and/or coordination of care.  This document serves as a record of services personally performed by Thea Silversmith, MD. It was created on her behalf by Darcus Austin, a trained medical scribe. The creation of this record is based on the scribe's personal observations and the provider's statements to them. This document has been checked and approved by the attending  provider.  ------------------------------------------------  Thea Silversmith, MD

## 2014-09-06 NOTE — Progress Notes (Signed)
[place] NOTE  Patient Care Team: Mayra Neer, MD as PCP - General (Family Medicine) Holley Bouche, NP as Nurse Practitioner (Nurse Practitioner)  CHIEF COMPLAINTS/PURPOSE OF CONSULTATION:  Right breat DCIS  HISTORY OF PRESENTING ILLNESS:  Connie Bennett 64 y.o. female is here because of recent diagnosis of right breast DCIS  On her recent follow-up with her primary care physician, a lump in her right breat was found on exam, and she was sent for mammogram. Her diagnostic mammogram and ultrasound on 08/19/2014 showed a 3 cm cystic lesion, an additional 7 mm and a 5 mm increased density lesion at 10:00 of her right breast. She has right breast cysts before which was biopsied before. She underwent core needle biopsy on 08/29/2014, which showed high-grade DCIS. The aspiration of the cystic lesion showed no malignant cells on cytology.  She has chronic diffuse arthritis pain, she had a back surgery twice and has a stimulator for pain control. She is able to take care of herself, and does some light housework. She walks around with a cane. She denies any recent change of her energy level, appetite or weight.  SUMMARY OF ONCOLOGIC HISTORY: Oncology History   Breast cancer of upper-outer quadrant of right female breast   Staging form: Breast, AJCC 7th Edition     Clinical: Stage 0 (Tis (DCIS), N0, M0) - Unsigned       Breast cancer of upper-outer quadrant of right female breast   08/23/2014 Mammogram Mammogram and ultrasound showed a 3cm cystic lesion at 8:30 of right breast, and a 7 mm and a 5 mm increased density lesion at 10:00 of the right breast.   08/29/2014 Initial Diagnosis Breast cancer of upper-outer quadrant of right female breast   08/29/2014 Receptors her2 ER-, PR-   08/29/2014 Initial Biopsy 2 right breast biopsy showed high-grade DCIS, there is a focus of mildly suspicious for early stromal invasion. The cyst aspiration cytology was negative.     In terms of breast cancer risk profile:  She menarched at early age of 81 and went to menopause at age 72 She had 1 pregnancy, her first child was born at age 53  She did not breast-fed her child.  She received birth control pills for approximately 15 years.  She was never exposed to fertility medications, but use hormone replacement therapy for 3-5 years  She has  family history of Breast/GYN/GI cancer   MEDICAL HISTORY:  Past Medical History  Diagnosis Date  . HTN (hypertension)   . Hypercholesterolemia   . Reflex sympathetic dystrophy   . Headache(784.0)   . Fibromyalgia   . Osteoporosis   . Mitral valve prolapse   . DVT (deep vein thrombosis) in pregnancy   . Depression   . Hyperparathyroidism   . Fibromyalgia   . Chronic fatigue syndrome   . Osteoporosis   . RSD (reflex sympathetic dystrophy)   . Arthritis   . Anxiety     SURGICAL HISTORY: Past Surgical History  Procedure Laterality Date  . Foot surgery      bilateral  . Carpel tunnel releaseother]      bilateral  . 2 back surgeries    . Parathyroid surgery    . Left wrist surgery    . Tonsillectomy      SOCIAL HISTORY: History   Social History  . Marital Status: Married    Spouse Name: N/A  . Number of Children: 1  . Years of Education: N/A  Occupational History  . unemployed     hair stylist  . disabled    Social History Main Topics  . Smoking status: Former Smoker -- 1.00 packs/day for 25 years    Quit date: 05/08/2014  . Smokeless tobacco: Not on file  . Alcohol Use: No  . Drug Use: No  . Sexual Activity: Not on file   Other Topics Concern  . Not on file   Social History Narrative    FAMILY HISTORY: Family History  Problem Relation Age of Onset  . Heart murmur Mother   . Cancer Mother 43    carcinoid tumor   . Heart murmur Sister     x2    ALLERGIES:  is allergic to carbocaine; latex; penicillins; and sulfa antibiotics.  MEDICATIONS:  Current Outpatient Prescriptions   Medication Sig Dispense Refill  . ALPRAZolam (XANAX) 0.5 MG tablet Take 0.5-1 mg by mouth 2 (two) times daily as needed for anxiety.     Marland Kitchen ascorbic acid (VITAMIN C) 1000 MG tablet Take 1,000 mg by mouth daily.      Marland Kitchen aspirin 81 MG chewable tablet Chew 81 mg by mouth daily.    Marland Kitchen ezetimibe (ZETIA) 10 MG tablet Take 10 mg by mouth daily.      . fish oil-omega-3 fatty acids 1000 MG capsule Take 3,000 mg by mouth daily.     . Flaxseed, Linseed, (FLAX SEED OIL) 1000 MG CAPS Take 3,000 mg by mouth daily. 3 capsules once a day    . furosemide (LASIX) 40 MG tablet Take 40 mg by mouth as needed.  1  . gabapentin (NEURONTIN) 600 MG tablet Take 600 mg by mouth 3 (three) times daily.      Marland Kitchen KLOR-CON M20 20 MEQ tablet Take 20 mEq by mouth as needed.  1  . lidocaine (LIDODERM) 5 % Place 1 patch onto the skin daily. Remove & Discard patch within 12 hours or as directed by MD     . Multiple Vitamins-Minerals (CENTRUM PO) Take 1 tablet by mouth daily. Once a day    . naloxone (NARCAN) 0.4 MG/ML injection Inject 0.4 mg into the muscle as needed.    Marland Kitchen oxyCODONE-acetaminophen (PERCOCET) 10-325 MG per tablet Take 1 tablet by mouth every 4 (four) hours as needed for pain.    . ramipril (ALTACE) 10 MG tablet Take 10 mg by mouth daily.      . rosuvastatin (CRESTOR) 20 MG tablet Take 20 mg by mouth daily.    Marland Kitchen tiZANidine (ZANAFLEX) 4 MG tablet Take 4-8 mg by mouth at bedtime as needed for muscle spasms. May take twice a day if needed    . venlafaxine (EFFEXOR-XR) 150 MG 24 hr capsule Take 150 mg by mouth 2 (two) times daily.     Marland Kitchen NIFEdipine (PROCARDIA) 10 MG capsule Take 10 mg by mouth 3 (three) times daily.      Marland Kitchen thiothixene (NAVANE) 2 MG capsule Take 2 mg by mouth daily.      No current facility-administered medications for this visit.    REVIEW OF SYSTEMS:   Constitutional: Denies fevers, chills or abnormal night sweats Eyes: Denies blurriness of vision, double vision or watery eyes Ears, nose, mouth,  throat, and face: Denies mucositis or sore throat Respiratory: Denies cough, dyspnea or wheezes Cardiovascular: Denies palpitation, chest discomfort or lower extremity swelling Gastrointestinal:  Denies nausea, heartburn or change in bowel habits Skin: Denies abnormal skin rashes Lymphatics: Denies new lymphadenopathy or easy bruising Neurological:Denies numbness, tingling  or new weaknesses Behavioral/Psych: Mood is stable, no new changes  All other systems were reviewed with the patient and are negative.  PHYSICAL EXAMINATION: ECOG PERFORMANCE STATUS: 1 - Symptomatic but completely ambulatory  Filed Vitals:   09/06/14 0846  BP: 130/64  Pulse: 98  Temp: 98.2 F (36.8 C)  Resp: 18   Filed Weights   09/06/14 0846  Weight: 154 lb 11.2 oz (70.171 kg)    GENERAL:alert, no distress and comfortable SKIN: skin color, texture, turgor are normal, no rashes or significant lesions EYES: normal, conjunctiva are pink and non-injected, sclera clear OROPHARYNX:no exudate, no erythema and lips, buccal mucosa, and tongue normal  NECK: supple, thyroid normal size, non-tender, without nodularity LYMPH:  no palpable lymphadenopathy in the cervical, axillary or inguinal LUNGS: clear to auscultation and percussion with normal breathing effort HEART: regular rate & rhythm and no murmurs and no lower extremity edema ABDOMEN:abdomen soft, non-tender and normal bowel sounds Musculoskeletal:no cyanosis of digits and no clubbing  PSYCH: alert & oriented x 3 with fluent speech NEURO: no focal motor/sensory deficits Breasts: Breast inspection showed them to be symmetrical with no nipple discharge. There is a 2 cm lump in the outer lower quadrant of right breast, consistent with her known cyst. no other palpable mass in the right breast. Palpation of the left breasts and axilla revealed no obvious mass that I could appreciate.    LABORATORY DATA:  I have reviewed the data as listed Lab Results  Component  Value Date   WBC 10.7* 09/06/2014   HGB 11.4* 09/06/2014   HCT 34.8 09/06/2014   MCV 92.3 09/06/2014   PLT 217 09/06/2014   Lab Results  Component Value Date   NA 143 09/06/2014   K 3.7 09/06/2014   CL 102 12/19/2013   CO2 27 09/06/2014   PATHOLOGY REPORT  Diagnosis 08/29/2014 1. Breast, right, needle core biopsy - DUCTAL CARCINOMA IN SITU. - SEE COMMENT. 2. Breast, right, needle core biopsy - DUCTAL CARCINOMA IN SITU. - SEE COMMENT. Microscopic Comment 1. The carcinoma is high grade. There is a focus mildly suspicious for early stromal invasion. Estrogen receptor and progesterone receptor studies will be performed and the reports reported separately. 2. The carcinoma is morphologically similar to that seen in part #1. Estrogen receptor and progesterone receptor studies will not be performed on part #2 unless requested. The results were called to [facility] on 08/30/2014. (JBK:ds 08/30/14)  Results: IMMUNOHISTOCHEMICAL AND MORPHOMETRIC ANALYSIS BY THE AUTOMATED CELLULAR IMAGING SYSTEM (ACIS) Estrogen Receptor: 0%, NEGATIVE (PERFORMED MANUALLY) Progesterone Receptor: 0%, NEGATIVE (PERFORMED MANUALLY) COMMENT: The negative hormone receptor study(ies) in this case have  RADIOGRAPHIC STUDIES: I have personally reviewed the radiological images as listed and agreed with the findings in the report.  Bone density scan 06/28/2013  Osteopenia T-score -2.1   ASSESSMENT:  right breast DCIS s/p lumpectomy, ER/PR positive  PLAN:  #1 right breast DCIS, ER/PR negative The patient had early stage disease. She is considered cured of disease by complete surgical resection. Any form of adjuvant treatment is for prevention of disease recurrence. She was seen by surgeon Dr. Barry Dienes today and is scheduled to have lumpectomy with sentinel lymph node biopsy (giving the possibility of microinvasion) next week. Given her tumor ER/PR negative status, I do not recommend chemoprevention with  antiestrogen therapy. She would likely benefit from adjuvant breast radiation to reduce the risk of local recurrence. She was seen by Dr. Pablo Ledger today.  Her biopsy showed a focus of earlier microinvasion.  I will see her after her surgery to review her surgical path.  If her final surgical past showed invasive carcinoma, then I'll discuss the benefit of adjuvant chemotherapy  #2 Bone health Her last DEXA scan in 2015 showed osteopenia. I encouraged her to continue calcium and vitamin D replacement  She will continue follow-up with her primary care physician for other medical issues.  Follow up: I'll see her back 2 weeks after her surgery.  All questions were answered. The patient knows to call the clinic with any problems, questions or concerns. I spent 40 minutes counseling the patient face to face. The total time spent in the appointment was 55 minutes and more than 50% was on counseling.     Truitt Merle, MD 09/06/2014 10:23 AM

## 2014-09-11 ENCOUNTER — Telehealth: Payer: Self-pay | Admitting: Hematology

## 2014-09-11 ENCOUNTER — Telehealth: Payer: Self-pay | Admitting: *Deleted

## 2014-09-11 ENCOUNTER — Encounter (HOSPITAL_BASED_OUTPATIENT_CLINIC_OR_DEPARTMENT_OTHER): Payer: Self-pay | Admitting: *Deleted

## 2014-09-11 NOTE — Telephone Encounter (Signed)
Left message for a return phone call from [facility] 09/06/14.  Awaiting patient response.

## 2014-09-11 NOTE — Telephone Encounter (Signed)
Called and left a message with her follow up appointment °

## 2014-09-13 ENCOUNTER — Ambulatory Visit (HOSPITAL_BASED_OUTPATIENT_CLINIC_OR_DEPARTMENT_OTHER): Payer: PPO | Admitting: Certified Registered"

## 2014-09-13 ENCOUNTER — Encounter (HOSPITAL_COMMUNITY)
Admission: RE | Admit: 2014-09-13 | Discharge: 2014-09-13 | Disposition: A | Discharge status: Home / Self Care | Payer: PPO | Source: Ambulatory Visit | Attending: General Surgery | Admitting: General Surgery

## 2014-09-13 ENCOUNTER — Ambulatory Visit (HOSPITAL_BASED_OUTPATIENT_CLINIC_OR_DEPARTMENT_OTHER)
Admission: RE | Admit: 2014-09-13 | Discharge: 2014-09-14 | Disposition: A | Discharge status: Home / Self Care | Payer: PPO | Source: Ambulatory Visit | Attending: General Surgery | Admitting: General Surgery

## 2014-09-13 ENCOUNTER — Encounter (HOSPITAL_BASED_OUTPATIENT_CLINIC_OR_DEPARTMENT_OTHER): Payer: Self-pay | Admitting: *Deleted

## 2014-09-13 ENCOUNTER — Encounter (HOSPITAL_BASED_OUTPATIENT_CLINIC_OR_DEPARTMENT_OTHER)
Admission: RE | Disposition: A | Discharge status: Home / Self Care | Payer: Self-pay | Source: Ambulatory Visit | Attending: General Surgery

## 2014-09-13 DIAGNOSIS — C50411 Malignant neoplasm of upper-outer quadrant of right female breast: Secondary | ICD-10-CM | POA: Insufficient documentation

## 2014-09-13 DIAGNOSIS — G4730 Sleep apnea, unspecified: Secondary | ICD-10-CM | POA: Diagnosis not present

## 2014-09-13 DIAGNOSIS — Z888 Allergy status to other drugs, medicaments and biological substances status: Secondary | ICD-10-CM | POA: Diagnosis not present

## 2014-09-13 DIAGNOSIS — D0511 Intraductal carcinoma in situ of right breast: Secondary | ICD-10-CM | POA: Insufficient documentation

## 2014-09-13 DIAGNOSIS — Z91040 Latex allergy status: Secondary | ICD-10-CM | POA: Insufficient documentation

## 2014-09-13 DIAGNOSIS — Z79899 Other long term (current) drug therapy: Secondary | ICD-10-CM | POA: Insufficient documentation

## 2014-09-13 DIAGNOSIS — I10 Essential (primary) hypertension: Secondary | ICD-10-CM | POA: Insufficient documentation

## 2014-09-13 DIAGNOSIS — Z87891 Personal history of nicotine dependence: Secondary | ICD-10-CM | POA: Diagnosis not present

## 2014-09-13 DIAGNOSIS — Z882 Allergy status to sulfonamides status: Secondary | ICD-10-CM | POA: Insufficient documentation

## 2014-09-13 DIAGNOSIS — F329 Major depressive disorder, single episode, unspecified: Secondary | ICD-10-CM | POA: Diagnosis not present

## 2014-09-13 DIAGNOSIS — Z880 Allergy status to penicillin: Secondary | ICD-10-CM | POA: Insufficient documentation

## 2014-09-13 DIAGNOSIS — C50911 Malignant neoplasm of unspecified site of right female breast: Secondary | ICD-10-CM | POA: Diagnosis present

## 2014-09-13 DIAGNOSIS — K649 Unspecified hemorrhoids: Secondary | ICD-10-CM | POA: Diagnosis not present

## 2014-09-13 DIAGNOSIS — G43909 Migraine, unspecified, not intractable, without status migrainosus: Secondary | ICD-10-CM | POA: Diagnosis not present

## 2014-09-13 DIAGNOSIS — E780 Pure hypercholesterolemia: Secondary | ICD-10-CM | POA: Insufficient documentation

## 2014-09-13 HISTORY — PX: RADIOACTIVE SEED GUIDED PARTIAL MASTECTOMY WITH AXILLARY SENTINEL LYMPH NODE BIOPSY: SHX6520

## 2014-09-13 SURGERY — RADIOACTIVE SEED GUIDED PARTIAL MASTECTOMY WITH AXILLARY SENTINEL LYMPH NODE BIOPSY
Anesthesia: General | Site: Breast | Laterality: Right

## 2014-09-13 MED ORDER — ALPRAZOLAM 0.25 MG PO TABS
0.5000 mg | ORAL_TABLET | Freq: Two times a day (BID) | ORAL | Status: DC | PRN
  Administered 2014-09-13: 1 mg via ORAL
  Filled 2014-09-13: qty 4

## 2014-09-13 MED ORDER — PHENYLEPHRINE HCL 10 MG/ML IJ SOLN
INTRAMUSCULAR | Status: DC | PRN
  Administered 2014-09-13 (×7): 40 ug via INTRAVENOUS

## 2014-09-13 MED ORDER — MIDAZOLAM HCL 2 MG/2ML IJ SOLN
INTRAMUSCULAR | Status: AC
  Filled 2014-09-13: qty 2

## 2014-09-13 MED ORDER — PROPOFOL 10 MG/ML IV BOLUS
INTRAVENOUS | Status: DC | PRN
  Administered 2014-09-13: 200 mg via INTRAVENOUS

## 2014-09-13 MED ORDER — ONDANSETRON HCL 4 MG PO TABS: 4.0000 mg | ORAL_TABLET | Freq: Four times a day (QID) | ORAL | Status: DC | PRN

## 2014-09-13 MED ORDER — MIDAZOLAM HCL 2 MG/2ML IJ SOLN
1.0000 mg | INTRAMUSCULAR | Status: DC | PRN
  Administered 2014-09-13: 1 mg via INTRAVENOUS

## 2014-09-13 MED ORDER — ACETAMINOPHEN 325 MG PO TABS: 650.0000 mg | ORAL_TABLET | ORAL | Status: DC | PRN

## 2014-09-13 MED ORDER — METHYLENE BLUE 1 % INJ SOLN
INTRAMUSCULAR | Status: AC
  Filled 2014-09-13: qty 20

## 2014-09-13 MED ORDER — CIPROFLOXACIN IN D5W 400 MG/200ML IV SOLN
INTRAVENOUS | Status: AC
  Filled 2014-09-13: qty 200

## 2014-09-13 MED ORDER — SODIUM CHLORIDE 0.9 % IJ SOLN
INTRAMUSCULAR | Status: DC | PRN
  Administered 2014-09-13: 5 mL via INTRAMUSCULAR

## 2014-09-13 MED ORDER — KCL IN DEXTROSE-NACL 20-5-0.45 MEQ/L-%-% IV SOLN
INTRAVENOUS | Status: DC
  Administered 2014-09-13: 12:00:00 via INTRAVENOUS
  Filled 2014-09-13: qty 1000

## 2014-09-13 MED ORDER — ONDANSETRON HCL 4 MG/2ML IJ SOLN
INTRAMUSCULAR | Status: DC | PRN
  Administered 2014-09-13: 4 mg via INTRAVENOUS

## 2014-09-13 MED ORDER — CIPROFLOXACIN IN D5W 400 MG/200ML IV SOLN
400.0000 mg | Freq: Two times a day (BID) | INTRAVENOUS | Status: AC
  Administered 2014-09-13: 400 mg via INTRAVENOUS
  Filled 2014-09-13: qty 200

## 2014-09-13 MED ORDER — TECHNETIUM TC 99M SULFUR COLLOID FILTERED
1.0000 | Freq: Once | INTRAVENOUS | Status: AC | PRN
  Administered 2014-09-13: 1 via INTRADERMAL

## 2014-09-13 MED ORDER — PHENYLEPHRINE HCL 10 MG/ML IJ SOLN
10.0000 mg | INTRAVENOUS | Status: DC | PRN
  Administered 2014-09-13: 40 ug/min via INTRAVENOUS

## 2014-09-13 MED ORDER — SCOPOLAMINE 1 MG/3DAYS TD PT72: 1.0000 | MEDICATED_PATCH | Freq: Once | TRANSDERMAL | Status: DC | PRN

## 2014-09-13 MED ORDER — OXYCODONE-ACETAMINOPHEN 10-325 MG PO TABS: 1.0000 | ORAL_TABLET | ORAL | Status: DC | PRN

## 2014-09-13 MED ORDER — DEXAMETHASONE SODIUM PHOSPHATE 4 MG/ML IJ SOLN
INTRAMUSCULAR | Status: DC | PRN
  Administered 2014-09-13: 10 mg via INTRAVENOUS

## 2014-09-13 MED ORDER — LIDOCAINE 5 % EX PTCH: 1.0000 | MEDICATED_PATCH | CUTANEOUS | Status: DC

## 2014-09-13 MED ORDER — TIZANIDINE HCL 4 MG PO TABS
4.0000 mg | ORAL_TABLET | Freq: Every evening | ORAL | Status: DC | PRN
  Administered 2014-09-13: 4 mg via ORAL
  Filled 2014-09-13: qty 2

## 2014-09-13 MED ORDER — ACETAMINOPHEN 500 MG PO TABS: 1000.0000 mg | ORAL_TABLET | Freq: Once | ORAL | Status: DC

## 2014-09-13 MED ORDER — FENTANYL CITRATE (PF) 100 MCG/2ML IJ SOLN
50.0000 ug | INTRAMUSCULAR | Status: DC | PRN
  Administered 2014-09-13: 25 ug via INTRAVENOUS
  Administered 2014-09-13: 100 ug via INTRAVENOUS

## 2014-09-13 MED ORDER — LIDOCAINE-EPINEPHRINE (PF) 1 %-1:200000 IJ SOLN
INTRAMUSCULAR | Status: AC
  Filled 2014-09-13: qty 10

## 2014-09-13 MED ORDER — OXYCODONE HCL 5 MG PO TABS
5.0000 mg | ORAL_TABLET | ORAL | Status: DC | PRN
  Administered 2014-09-13: 10 mg via ORAL
  Administered 2014-09-13: 5 mg via ORAL
  Administered 2014-09-14: 10 mg via ORAL
  Filled 2014-09-13 (×3): qty 2

## 2014-09-13 MED ORDER — GABAPENTIN 600 MG PO TABS
600.0000 mg | ORAL_TABLET | Freq: Three times a day (TID) | ORAL | Status: DC
  Administered 2014-09-13 (×2): 600 mg via ORAL

## 2014-09-13 MED ORDER — ACETAMINOPHEN 160 MG/5ML PO SOLN: 960.0000 mg | Freq: Once | ORAL | Status: DC

## 2014-09-13 MED ORDER — THIOTHIXENE 2 MG PO CAPS
2.0000 mg | ORAL_CAPSULE | Freq: Every day | ORAL | Status: DC
  Administered 2014-09-13: 2 mg via ORAL
  Filled 2014-09-13: qty 1

## 2014-09-13 MED ORDER — EZETIMIBE 10 MG PO TABS
10.0000 mg | ORAL_TABLET | Freq: Every day | ORAL | Status: DC
  Administered 2014-09-13: 10 mg via ORAL
  Filled 2014-09-13: qty 1

## 2014-09-13 MED ORDER — CIPROFLOXACIN IN D5W 400 MG/200ML IV SOLN
400.0000 mg | INTRAVENOUS | Status: AC
  Administered 2014-09-13 (×2): 400 mg via INTRAVENOUS

## 2014-09-13 MED ORDER — OXYCODONE HCL 5 MG/5ML PO SOLN: 5.0000 mg | Freq: Once | ORAL | Status: AC | PRN

## 2014-09-13 MED ORDER — LIDOCAINE HCL (CARDIAC) 20 MG/ML IV SOLN
INTRAVENOUS | Status: DC | PRN
  Administered 2014-09-13: 6 mg via INTRAVENOUS

## 2014-09-13 MED ORDER — EPHEDRINE SULFATE 50 MG/ML IJ SOLN
INTRAMUSCULAR | Status: DC | PRN
  Administered 2014-09-13 (×4): 10 mg via INTRAVENOUS

## 2014-09-13 MED ORDER — ONDANSETRON HCL 4 MG/2ML IJ SOLN: 4.0000 mg | Freq: Four times a day (QID) | INTRAMUSCULAR | Status: DC | PRN

## 2014-09-13 MED ORDER — FENTANYL CITRATE (PF) 100 MCG/2ML IJ SOLN
INTRAMUSCULAR | Status: AC
  Filled 2014-09-13: qty 2

## 2014-09-13 MED ORDER — SODIUM CHLORIDE 0.9 % IV SOLN: 250.0000 mL | INTRAVENOUS | Status: DC | PRN

## 2014-09-13 MED ORDER — HYDROMORPHONE HCL 1 MG/ML IJ SOLN: 0.2500 mg | INTRAMUSCULAR | Status: DC | PRN

## 2014-09-13 MED ORDER — SODIUM CHLORIDE 0.9 % IJ SOLN: 3.0000 mL | INTRAMUSCULAR | Status: DC | PRN

## 2014-09-13 MED ORDER — OXYCODONE HCL 5 MG PO TABS: 5.0000 mg | ORAL_TABLET | Freq: Once | ORAL | Status: AC | PRN

## 2014-09-13 MED ORDER — SODIUM CHLORIDE 0.9 % IJ SOLN
3.0000 mL | Freq: Two times a day (BID) | INTRAMUSCULAR | Status: DC
  Administered 2014-09-13: 3 mL via INTRAVENOUS

## 2014-09-13 MED ORDER — VENLAFAXINE HCL ER 150 MG PO CP24: 150.0000 mg | ORAL_CAPSULE | Freq: Two times a day (BID) | ORAL | Status: DC

## 2014-09-13 MED ORDER — LACTATED RINGERS IV SOLN
INTRAVENOUS | Status: DC
  Administered 2014-09-13 (×2): via INTRAVENOUS

## 2014-09-13 MED ORDER — MEPERIDINE HCL 25 MG/ML IJ SOLN: 6.2500 mg | INTRAMUSCULAR | Status: DC | PRN

## 2014-09-13 MED ORDER — SODIUM CHLORIDE 0.9 % IJ SOLN
INTRAMUSCULAR | Status: AC
  Filled 2014-09-13: qty 20

## 2014-09-13 MED ORDER — DIPHENHYDRAMINE HCL 50 MG/ML IJ SOLN
INTRAMUSCULAR | Status: DC | PRN
  Administered 2014-09-13: 12.5 mg via INTRAVENOUS

## 2014-09-13 MED ORDER — AMLODIPINE BESYLATE 5 MG PO TABS: 5.0000 mg | ORAL_TABLET | Freq: Every day | ORAL | Status: DC

## 2014-09-13 MED ORDER — MORPHINE SULFATE 2 MG/ML IJ SOLN: 1.0000 mg | INTRAMUSCULAR | Status: DC | PRN

## 2014-09-13 MED ORDER — FENTANYL CITRATE (PF) 100 MCG/2ML IJ SOLN
INTRAMUSCULAR | Status: AC
  Filled 2014-09-13: qty 6

## 2014-09-13 MED ORDER — LIDOCAINE HCL 2 % IJ SOLN
INTRAMUSCULAR | Status: AC
  Filled 2014-09-13: qty 40

## 2014-09-13 MED ORDER — PROPOFOL 500 MG/50ML IV EMUL
INTRAVENOUS | Status: AC
  Filled 2014-09-13: qty 50

## 2014-09-13 MED ORDER — RAMIPRIL 10 MG PO TABS: 10.0000 mg | ORAL_TABLET | Freq: Every day | ORAL | Status: DC

## 2014-09-13 MED ORDER — LIDOCAINE HCL 2 % IJ SOLN
INTRAMUSCULAR | Status: DC | PRN
  Administered 2014-09-13: 25 mL

## 2014-09-13 MED ORDER — ROSUVASTATIN CALCIUM 20 MG PO TABS
20.0000 mg | ORAL_TABLET | Freq: Every day | ORAL | Status: DC
  Administered 2014-09-13: 20 mg via ORAL
  Filled 2014-09-13: qty 1

## 2014-09-13 MED ORDER — ACETAMINOPHEN 650 MG RE SUPP: 650.0000 mg | RECTAL | Status: DC | PRN

## 2014-09-13 MED ORDER — BUPIVACAINE HCL (PF) 0.25 % IJ SOLN
INTRAMUSCULAR | Status: AC
  Filled 2014-09-13: qty 90

## 2014-09-13 SURGICAL SUPPLY — 66 items
ADH SKN CLS APL DERMABOND .7 (GAUZE/BANDAGES/DRESSINGS) ×3
APPLIER CLIP 9.375 MED OPEN (MISCELLANEOUS) ×6
APR CLP MED 9.3 20 MLT OPN (MISCELLANEOUS) ×3
BINDER BREAST LRG (GAUZE/BANDAGES/DRESSINGS) IMPLANT
BINDER BREAST MEDIUM (GAUZE/BANDAGES/DRESSINGS) IMPLANT
BINDER BREAST XLRG (GAUZE/BANDAGES/DRESSINGS) ×1 IMPLANT
BINDER BREAST XXLRG (GAUZE/BANDAGES/DRESSINGS) IMPLANT
BLADE HEX COATED 2.75 (ELECTRODE) ×2 IMPLANT
BLADE SURG 10 STRL SS (BLADE) ×2 IMPLANT
BLADE SURG 15 STRL LF DISP TIS (BLADE) ×1 IMPLANT
BLADE SURG 15 STRL SS (BLADE) ×2
BNDG COHESIVE 4X5 TAN STRL (GAUZE/BANDAGES/DRESSINGS) ×2 IMPLANT
CANISTER SUC SOCK COL 7IN (MISCELLANEOUS) IMPLANT
CANISTER SUCT 1200ML W/VALVE (MISCELLANEOUS) IMPLANT
CHLORAPREP W/TINT 26ML (MISCELLANEOUS) ×2 IMPLANT
CLIP APPLIE 9.375 MED OPEN (MISCELLANEOUS) IMPLANT
CLIP TI LARGE 6 (CLIP) ×3 IMPLANT
CLIP TI MEDIUM 6 (CLIP) ×2 IMPLANT
CLIP TI WIDE RED SMALL 6 (CLIP) IMPLANT
CLSR STERI-STRIP ANTIMIC 1/2X4 (GAUZE/BANDAGES/DRESSINGS) ×1 IMPLANT
COVER MAYO STAND STRL (DRAPES) ×2 IMPLANT
COVER PROBE W GEL 5X96 (DRAPES) ×2 IMPLANT
DECANTER SPIKE VIAL GLASS SM (MISCELLANEOUS) IMPLANT
DERMABOND ADVANCED (GAUZE/BANDAGES/DRESSINGS) ×3
DERMABOND ADVANCED .7 DNX12 (GAUZE/BANDAGES/DRESSINGS) IMPLANT
DEVICE DUBIN W/COMP PLATE 8390 (MISCELLANEOUS) ×2 IMPLANT
DRAPE UTILITY XL STRL (DRAPES) ×2 IMPLANT
DRSG PAD ABDOMINAL 8X10 ST (GAUZE/BANDAGES/DRESSINGS) ×1 IMPLANT
ELECT REM PT RETURN 9FT ADLT (ELECTROSURGICAL) ×2
ELECTRODE REM PT RTRN 9FT ADLT (ELECTROSURGICAL) ×1 IMPLANT
GLOVE BIO SURGEON STRL SZ 6 (GLOVE) ×2 IMPLANT
GLOVE BIOGEL PI IND STRL 6.5 (GLOVE) ×1 IMPLANT
GLOVE BIOGEL PI IND STRL 7.0 (GLOVE) IMPLANT
GLOVE BIOGEL PI INDICATOR 6.5 (GLOVE) ×2
GLOVE BIOGEL PI INDICATOR 7.0 (GLOVE) ×1
GOWN STRL REUS W/ TWL LRG LVL3 (GOWN DISPOSABLE) ×1 IMPLANT
GOWN STRL REUS W/TWL 2XL LVL3 (GOWN DISPOSABLE) ×2 IMPLANT
GOWN STRL REUS W/TWL LRG LVL3 (GOWN DISPOSABLE) ×2
KIT MARKER MARGIN INK (KITS) ×2 IMPLANT
LIQUID BAND (GAUZE/BANDAGES/DRESSINGS) ×2 IMPLANT
NDL HYPO 25X1 1.5 SAFETY (NEEDLE) ×1 IMPLANT
NEEDLE HYPO 25X1 1.5 SAFETY (NEEDLE) ×2 IMPLANT
NS IRRIG 1000ML POUR BTL (IV SOLUTION) ×1 IMPLANT
PACK BASIN DAY SURGERY FS (CUSTOM PROCEDURE TRAY) ×2 IMPLANT
PACK UNIVERSAL I (CUSTOM PROCEDURE TRAY) ×2 IMPLANT
PENCIL BUTTON HOLSTER BLD 10FT (ELECTRODE) ×2 IMPLANT
SLEEVE SCD COMPRESS KNEE MED (MISCELLANEOUS) ×2 IMPLANT
SPONGE GAUZE 4X4 12PLY STER LF (GAUZE/BANDAGES/DRESSINGS) ×2 IMPLANT
SPONGE LAP 18X18 X RAY DECT (DISPOSABLE) ×2 IMPLANT
STAPLER VISISTAT 35W (STAPLE) IMPLANT
STOCKINETTE IMPERVIOUS LG (DRAPES) ×2 IMPLANT
STRIP CLOSURE SKIN 1/2X4 (GAUZE/BANDAGES/DRESSINGS) ×2 IMPLANT
SUT ETHILON 2 0 FS 18 (SUTURE) IMPLANT
SUT MNCRL AB 4-0 PS2 18 (SUTURE) ×3 IMPLANT
SUT MON AB 5-0 PS2 18 (SUTURE) ×1 IMPLANT
SUT SILK 2 0 SH (SUTURE) IMPLANT
SUT VIC AB 2-0 SH 27 (SUTURE) ×2
SUT VIC AB 2-0 SH 27XBRD (SUTURE) ×1 IMPLANT
SUT VIC AB 3-0 SH 27 (SUTURE) ×4
SUT VIC AB 3-0 SH 27X BRD (SUTURE) ×1 IMPLANT
SUT VIC AB 5-0 PS2 18 (SUTURE) IMPLANT
SYR CONTROL 10ML LL (SYRINGE) ×2 IMPLANT
TOWEL OR 17X24 6PK STRL BLUE (TOWEL DISPOSABLE) ×2 IMPLANT
TOWEL OR NON WOVEN STRL DISP B (DISPOSABLE) ×2 IMPLANT
TUBE CONNECTING 20X1/4 (TUBING) ×2 IMPLANT
YANKAUER SUCT BULB TIP NO VENT (SUCTIONS) ×1 IMPLANT

## 2014-09-13 NOTE — Anesthesia Procedure Notes (Signed)
Procedure Name: LMA Insertion Date/Time: 09/13/2014 9:08 AM Performed by: Harshika Mago D Pre-anesthesia Checklist: Patient identified, Emergency Drugs available, Suction available and Patient being monitored Patient Re-evaluated:Patient Re-evaluated prior to inductionOxygen Delivery Method: Circle System Utilized Preoxygenation: Pre-oxygenation with 100% oxygen Intubation Type: IV induction Ventilation: Mask ventilation without difficulty LMA: LMA inserted LMA Size: 4.0 Number of attempts: 1 Airway Equipment and Method: Bite block Placement Confirmation: positive ETCO2 Tube secured with: Tape Dental Injury: Teeth and Oropharynx as per pre-operative assessment

## 2014-09-13 NOTE — Anesthesia Postprocedure Evaluation (Signed)
  Anesthesia Post-op Note  Patient: Connie Bennett  Procedure(s) Performed: Procedure(s): RADIOACTIVE SEED GUIDED PARTIAL MASTECTOMY WITH AXILLARY SENTINEL LYMPH NODE BIOPSY (Right)  Patient Location: PACU  Anesthesia Type: General   Level of Consciousness: awake, alert  and oriented  Airway and Oxygen Therapy: Patient Spontanous Breathing  Post-op Pain: mild  Post-op Assessment: Post-op Vital signs reviewed  Post-op Vital Signs: Reviewed  Last Vitals:  Filed Vitals:   09/13/14 1130  BP: 138/73  Pulse: 93  Temp:   Resp: 11    Complications: No apparent anesthesia complications

## 2014-09-13 NOTE — Transfer of Care (Signed)
Immediate Anesthesia Transfer of Care Note  Patient: Connie Bennett  Procedure(s) Performed: Procedure(s): RADIOACTIVE SEED GUIDED PARTIAL MASTECTOMY WITH AXILLARY SENTINEL LYMPH NODE BIOPSY (Right)  Patient Location: PACU  Anesthesia Type:General  Level of Consciousness: awake and patient cooperative  Airway & Oxygen Therapy: Patient Spontanous Breathing and Patient connected to face mask oxygen  Post-op Assessment: Report given to RN and Post -op Vital signs reviewed and stable  Post vital signs: Reviewed and stable  Last Vitals:  Filed Vitals:   09/13/14 0834  BP:   Pulse: 75  Temp:   Resp: 18    Complications: No apparent anesthesia complications

## 2014-09-13 NOTE — Interval H&P Note (Signed)
History and Physical Interval Note:  09/13/2014 8:54 AM  Connie Bennett  has presented today for surgery, with the diagnosis of RIGHT BREAST CANCER  The various methods of treatment have been discussed with the patient and family. After consideration of risks, benefits and other options for treatment, the patient has consented to  Procedure(s): RADIOACTIVE SEED GUIDED PARTIAL MASTECTOMY WITH AXILLARY SENTINEL LYMPH NODE BIOPSY (Right) as a surgical intervention .  The patient's history has been reviewed, patient examined, no change in status, stable for surgery.  I have reviewed the patient's chart and labs.  Questions were answered to the patient's satisfaction.     Connie Bennett

## 2014-09-13 NOTE — Progress Notes (Signed)
Emotional support during breast injections °

## 2014-09-13 NOTE — Op Note (Signed)
Right Breast Radioactive seed localized lumpectomy and sentinel lymph node biopsy  Indications: This patient presents with history of right breast cancer, Stage 0 with suspicion of microinvasion  Pre-operative Diagnosis: see above  Post-operative Diagnosis: same  Surgeon: [name]   Anesthesia: General endotracheal anesthesia  ASA Class: 3  Procedure Details  The patient was seen in the Holding Room. The risks, benefits, complications, treatment options, and expected outcomes were discussed with the patient. The possibilities of bleeding, infection, the need for additional procedures, failure to diagnose a condition, and creating a complication requiring transfusion or operation were discussed with the patient. The patient concurred with the proposed plan, giving informed consent.  The site of surgery properly noted/marked. The patient was taken to Operating Room # 8, identified, and the procedure verified as Right Breast Lumpectomy. A Time Out was held and the above information confirmed.  The right arm, breast, and chest were prepped and draped in standard fashion. The lumpectomy was performed by creating an transverse incision over the upper outer quadrant of the breast over the previously placed radioactive seed.  Dissection was carried down to around the point of maximum signal intensity. The cautery was used to perform the dissection.  Hemostasis was achieved with cautery. The edges of the cavity were marked with large clips, with one each medial, lateral, inferior and superior, and two clips posteriorly.   The specimen was inked with the margin marker paint kit.    Specimen radiography confirmed inclusion of the mammographic lesion, the clip, and the seed.  The background signal in the breast was zero.  The wound was irrigated and closed with 3-0 vicryl in layers and 4-0 monocryl subcuticular suture.    Using a hand-held gamma probe, right axillary sentinel nodes were identified  transcutaneously.  An oblique incision was created below the axillary hairline.  Dissection was carried through the clavipectoral fascia.  Five level 2 axillary sentinel nodes were removed.  Counts per second were 250, 490, 1200, 840, and 350.  Background counts were 20.   The wound was irrigated.  Hemostasis was achieved with cautery.  The axillary incision was closed with a 3-0 vicryl deep dermal interrupted sutures and a 4-0 monocryl subcuticular closure.    Sterile dressings were applied. At the end of the operation, all sponge, instrument, and needle counts were correct.  Findings: grossly clear surgical margins and no adenopathy  Estimated Blood Loss:  min         Specimens: right breast lumpectomy and 5 axillary sentinel lymph nodes.             Complications:  None; patient tolerated the procedure well.         Disposition: PACU - hemodynamically stable.         Condition: stable

## 2014-09-13 NOTE — Anesthesia Preprocedure Evaluation (Signed)
Anesthesia Evaluation    Airway Mallampati: I  TM Distance: >3 FB Neck ROM: Full    Dental  (+) Teeth Intact, Dental Advisory Given   Pulmonary sleep apnea , former smoker,  breath sounds clear to auscultation        Cardiovascular hypertension, Rhythm:Regular Rate:Normal     Neuro/Psych    GI/Hepatic   Endo/Other    Renal/GU      Musculoskeletal   Abdominal   Peds  Hematology   Anesthesia Other Findings Pt not fully aware of her sleep apnea or what that means.  Reproductive/Obstetrics                             Anesthesia Physical Anesthesia Plan  ASA: III  Anesthesia Plan: General   Post-op Pain Management:    Induction: Intravenous  Airway Management Planned: LMA  Additional Equipment:   Intra-op Plan:   Post-operative Plan: Extubation in OR  Informed Consent: I have reviewed the patients History and Physical, chart, labs and discussed the procedure including the risks, benefits and alternatives for the proposed anesthesia with the patient or authorized representative who has indicated his/her understanding and acceptance.   Dental advisory given  Plan Discussed with: CRNA, Anesthesiologist and Surgeon  Anesthesia Plan Comments:         Anesthesia Quick Evaluation

## 2014-09-13 NOTE — Discharge Instructions (Addendum)
[facility] °Office Phone Number [phone] ° °BREAST BIOPSY/ PARTIAL MASTECTOMY: POST OP INSTRUCTIONS ° °Always review your discharge instruction sheet given to you by the facility where your surgery was performed. ° °IF YOU HAVE DISABILITY OR FAMILY LEAVE FORMS, YOU MUST BRING THEM TO THE OFFICE FOR PROCESSING.  DO NOT GIVE THEM TO YOUR DOCTOR. ° °1. A prescription for pain medication may be given to you upon discharge.  Take your pain medication as prescribed, if needed.  If narcotic pain medicine is not needed, then you may take acetaminophen (Tylenol) or ibuprofen (Advil) as needed. °2. Take your usually prescribed medications unless otherwise directed °3. If you need a refill on your pain medication, please contact your pharmacy.  They will contact our office to request authorization.  Prescriptions will not be filled after 5pm or on week-ends. °4. You should eat very light the first 24 hours after surgery, such as soup, crackers, pudding, etc.  Resume your normal diet the day after surgery. °5. Most patients will experience some swelling and bruising in the breast.  Ice packs and a good support bra will help.  Swelling and bruising can take several days to resolve.  °6. It is common to experience some constipation if taking pain medication after surgery.  Increasing fluid intake and taking a stool softener will usually help or prevent this problem from occurring.  A mild laxative (Milk of Magnesia or Miralax) should be taken according to package directions if there are no bowel movements after 48 hours. °7. Unless discharge instructions indicate otherwise, you may remove your bandages 48 hours after surgery, and you may shower at that time.  You may have steri-strips (small skin tapes) in place directly over the incision.  These strips should be left on the skin for 7-10 days.   Any sutures or staples will be removed at the office during your follow-up visit. °8. ACTIVITIES:  You may resume  regular daily activities (gradually increasing) beginning the next day.  Wearing a good support bra or sports bra (or the breast binder) minimizes pain and swelling.  You may have sexual intercourse when it is comfortable. °a. You may drive when you no longer are taking prescription pain medication, you can comfortably wear a seatbelt, and you can safely maneuver your car and apply brakes. °b. RETURN TO WORK:  __________1 week_______________ °9. You should see your doctor in the office for a follow-up appointment approximately two weeks after your surgery.  Your doctor’s nurse will typically make your follow-up appointment when she calls you with your pathology report.  Expect your pathology report 2-3 business days after your surgery.  You may call to check if you do not hear from us after three days. ° ° °WHEN TO CALL YOUR DOCTOR: °1. Fever over 101.0 °2. Nausea and/or vomiting. °3. Extreme swelling or bruising. °4. Continued bleeding from incision. °5. Increased pain, redness, or drainage from the incision. ° °The clinic staff is available to answer your questions during regular business hours.  Please don’t hesitate to call and ask to speak to one of the nurses for clinical concerns.  If you have a medical emergency, go to the nearest emergency room or call 911.  A surgeon from [facility] is always on call at the hospital. ° °For further questions, please visit [email or web address]  ° ° °Post Anesthesia Home Care Instructions ° °Activity: °Get plenty of rest for the remainder of the day. A responsible adult should stay with you for 24   hours following the procedure.  °For the next 24 hours, DO NOT: °-Drive a car °-Operate machinery °-Drink alcoholic beverages °-Take any medication unless instructed by your physician °-Make any legal decisions or sign important papers. ° °Meals: °Start with liquid foods such as gelatin or soup. Progress to regular foods as tolerated. Avoid greasy, spicy, heavy  foods. If nausea and/or vomiting occur, drink only clear liquids until the nausea and/or vomiting subsides. Call your physician if vomiting continues. ° °Special Instructions/Symptoms: °Your throat may feel dry or sore from the anesthesia or the breathing tube placed in your throat during surgery. If this causes discomfort, gargle with warm salt water. The discomfort should disappear within 24 hours. ° °If you had a scopolamine patch placed behind your ear for the management of post- operative nausea and/or vomiting: ° °1. The medication in the patch is effective for 72 hours, after which it should be removed.  Wrap patch in a tissue and discard in the trash. Wash hands thoroughly with soap and water. °2. You may remove the patch earlier than 72 hours if you experience unpleasant side effects which may include dry mouth, dizziness or visual disturbances. °3. Avoid touching the patch. Wash your hands with soap and water after contact with the patch. °  ° °

## 2014-09-13 NOTE — H&P (Signed)
Connie Bennett 09/06/2014 7:47 AM Location: [facility] Patient #: [id number] DOB: 1950/11/04 Undefined / Language: Connie Bennett / Race: Undefined Female  History of Present Illness [name] MD; 09/06/2014 9:49 AM) The patient is a 64 year old female who presents with breast cancer. Patient is a 64 yo F referred by Dr. Brigitte Pulse for consultation regarding a palpable right breast mass and a new diagnosis of cancer. She also had undergone a fall. She underwent diagnostic mammography and ultrasound of the affected breast. She had a cyst located at 8:30 in the right breast that was aspirated. She had two areas in the upper outer right breast. One was 7 mm, and the other was 5 mm. They were very close together, but appeared to be separate. Clips were 1.5 cm apart on clip mammogram. Both areas were biopsied, and they showed similar high grade DCIS with suspicion for early microinvasion. The DCIS was hormone receptor negative. She has no family history of breast cancer. Her mother had a rare type of liver cancer. She had menarche at age 60. Menopause was in the early 42s. She is a G1P1 with first child at age 36-20. She did use oral contraceptives.  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 She also has had spine fusion and a repair of ruptured disk. She is a previous smoker.    Other Problems Anderson Malta [place], RMA; 09/06/2014 7:48 AM) Arthritis Back Pain Bladder Problems Breast Cancer Depression Heart murmur Hemorrhoids High blood pressure Hypercholesterolemia Lump In Breast Migraine Headache Pulmonary Embolism / Blood Clot in Legs Thyroid Disease Ventral Hernia Repair  Past Surgical History Anderson Malta [place], RMA; 09/06/2014 7:48 AM) Breast Biopsy Right. Colon Polyp Removal - Colonoscopy Foot Surgery Bilateral. Oral Surgery Spinal Surgery - Lower Back Spinal Surgery Midback Thyroid Surgery Tonsillectomy  Diagnostic  Studies History Anderson Malta [place], RMA; 09/06/2014 7:48 AM) Colonoscopy 1-5 years ago Mammogram within last year  Social History Anderson Malta [place], RMA; 09/06/2014 7:48 AM) Caffeine use Tea. No alcohol use No drug use Tobacco use Never smoker.  Family History Anderson Malta [place], [place]; 09/06/2014 7:48 AM) Arthritis Mother. Cancer Mother. Hypertension Mother. Kidney Disease Mother. Thyroid problems Mother.  Pregnancy / Birth History Anderson Malta [place], [place]; 09/06/2014 7:48 AM) Age at menarche 3 years. Age of menopause 51-55 Contraceptive History Oral contraceptives. Gravida 1 Maternal age 58-20 Para 1  Review of Systems Anderson Malta [place] RMA; 09/06/2014 7:48 AM) General Present- Appetite Loss and Fatigue. Not Present- Chills, Fever, Night Sweats, Weight Gain and Weight Loss. Skin Not Present- Change in Wart/Mole, Dryness, Hives, Jaundice, New Lesions, Non-Healing Wounds, Rash and Ulcer. HEENT Present- Seasonal Allergies and Wears glasses/contact lenses. Not Present- Earache, Hearing Loss, Hoarseness, Nose Bleed, Oral Ulcers, Ringing in the Ears, Sinus Pain, Sore Throat, Visual Disturbances and Yellow Eyes. Respiratory Present- Snoring. Not Present- Bloody sputum, Chronic Cough, Difficulty Breathing and Wheezing. Breast Present- Breast Mass. Not Present- Breast Pain, Nipple Discharge and Skin Changes. Cardiovascular Present- Swelling of Extremities. Not Present- Chest Pain, Difficulty Breathing Lying Down, Leg Cramps, Palpitations, Rapid Heart Rate and Shortness of Breath. Gastrointestinal Present- Hemorrhoids. Not Present- Abdominal Pain, Bloating, Bloody Stool, Change in Bowel Habits, Chronic diarrhea, Constipation, Difficulty Swallowing, Excessive gas, Gets full quickly at meals, Indigestion, Nausea, Rectal Pain and Vomiting. Female Genitourinary Not Present- Frequency, Nocturia, Painful Urination, Pelvic Pain and Urgency. Musculoskeletal Present- Back Pain, Joint Pain and Muscle Pain.  Not Present- Joint Stiffness, Muscle Weakness and Swelling of Extremities. Neurological Present- Headaches and Trouble walking.  Not Present- Decreased Memory, Fainting, Numbness, Seizures, Tingling, Tremor and Weakness. Psychiatric Present- Anxiety, Depression and Fearful. Not Present- Bipolar, Change in Sleep Pattern and Frequent crying. Endocrine Not Present- Cold Intolerance, Excessive Hunger, Hair Changes, Heat Intolerance, Hot flashes and New Diabetes. Hematology Not Present- Easy Bruising, Excessive bleeding, Gland problems, HIV and Persistent Infections.   Physical Exam [name] MD; 09/06/2014 9:51 AM) General Mental Status-Alert. General Appearance-Consistent with stated age. Hydration-Well hydrated. Voice-Normal.  Head and Neck Head-normocephalic, atraumatic with no lesions or palpable masses. Trachea-midline. Thyroid Gland Characteristics - normal size and consistency.  Eye Eyeball - Bilateral-Extraocular movements intact. Sclera/Conjunctiva - Bilateral-No scleral icterus.  Chest and Lung Exam Chest and lung exam reveals -quiet, even and easy respiratory effort with no use of accessory muscles and on auscultation, normal breath sounds, no adventitious sounds and normal vocal resonance. Inspection Chest Wall - Normal. Back - normal.  Breast Note: Small palpable mass in the lower outer quadrant of the right breast consistent with cyst location. She has no nipple retraction, no axillary lymphadenopathy. She does not have any skin dimpling or nipple discharge. left breast is normal. Breasts are symmetric bilaterally.   Cardiovascular Cardiovascular examination reveals -normal heart sounds, regular rate and rhythm with no murmurs and normal pedal pulses bilaterally.  Abdomen Inspection Inspection of the abdomen reveals - No Hernias. Palpation/Percussion Palpation and Percussion of the abdomen reveal - Soft, Non Tender, No Rebound tenderness, No  Rigidity (guarding) and No hepatosplenomegaly. Auscultation Auscultation of the abdomen reveals - Bowel sounds normal.  Neurologic Neurologic evaluation reveals -alert and oriented x 3 with no impairment of recent or remote memory. Mental Status-Normal.  Musculoskeletal Global Assessment -Note: no gross deformities, but slight kyphosis.  Normal Exam - Left-Upper Extremity Strength Normal and Lower Extremity Strength Normal. Normal Exam - Right-Upper Extremity Strength Normal and Lower Extremity Strength Normal.  Lymphatic Head & Neck  General Head & Neck Lymphatics: Bilateral - Description - Normal. Axillary  General Axillary Region: Bilateral - Description - Normal. Tenderness - Non Tender. Femoral & Inguinal  Generalized Femoral & Inguinal Lymphatics: Bilateral - Description - No Generalized lymphadenopathy.    Assessment & Plan [name] MD; 09/06/2014 9:53 AM) PRIMARY CANCER OF RIGHT FEMALE BREAST (174.9  C50.911) Impression: Patient has a new diagnosis of Stage 0 breast cancer, cTis. Her stage may be upgraded if the suspicion for microinvasion is borne out on final pathology. We will recommend breast conservation therapy with a seed localized lumpectomy and sentinel lymph node biopsy. Her treatment plan will also include external beam radiation. Unless her final pathology changes, she would not be recommended to receive antiestrogen therapy.  The surgical procedure was described to the patient. I discussed the incision types and location and that we would need radiology involved with a wire or seed marker and sentinel node.  The risks and benefits of the procedure were described to the patient and she wishes to proceed.  We discussed the risks bleeding, infection, damage to other structures, need for further procedures/surgeries. We discussed the risk of seroma. The patient was advised that we may need to go back to surgery for additional tissue to obtain  negative margins or for additional lymph nodes. The patient was advised that these are the most common complications, but that others can occur as well. She was advised against taking aspirin or other anti-inflammatory agents/blood thinners the week before surgery.  45 min spent in examination, evaluation, counseling, and coordination of care. >50% spent in counseling. Current Plans  Schedule for Surgery Pt Education - [facility]b breast cancer surgery: discussed with patient and provided information.   Signed by [name], MD (09/06/2014 9:55 AM)

## 2014-09-14 DIAGNOSIS — D0511 Intraductal carcinoma in situ of right breast: Secondary | ICD-10-CM | POA: Diagnosis not present

## 2014-09-15 ENCOUNTER — Encounter (HOSPITAL_BASED_OUTPATIENT_CLINIC_OR_DEPARTMENT_OTHER): Payer: Self-pay | Admitting: General Surgery

## 2014-09-18 ENCOUNTER — Telehealth: Payer: Self-pay | Admitting: General Surgery

## 2014-09-18 ENCOUNTER — Other Ambulatory Visit: Payer: Self-pay | Admitting: General Surgery

## 2014-09-18 NOTE — Telephone Encounter (Signed)
Discussed pathology with patient and sister.    Will go back for margins.

## 2014-09-21 ENCOUNTER — Telehealth: Payer: Self-pay | Admitting: Hematology

## 2014-09-21 ENCOUNTER — Other Ambulatory Visit: Payer: Self-pay | Admitting: *Deleted

## 2014-09-21 NOTE — Telephone Encounter (Signed)
lvm for pt regarding to 6.29 appt moved to 7.15

## 2014-09-27 ENCOUNTER — Ambulatory Visit: Payer: Medicare Other | Admitting: Hematology

## 2014-09-27 ENCOUNTER — Encounter (HOSPITAL_BASED_OUTPATIENT_CLINIC_OR_DEPARTMENT_OTHER): Payer: Self-pay | Admitting: *Deleted

## 2014-09-27 NOTE — Progress Notes (Addendum)
NPO AFTER MN.  ARRIVE AT 0600.  CURRENT LAB RESULTS AND EKG IN CHART AND EPIC.  WILL TAKE NORVASC, NEURONTIN, AND EFFEXOR AM DOS W/ SIPS OF WATER.  WILL DO HIBICLENS SHOWER HS BEFORE DOS.

## 2014-09-29 ENCOUNTER — Encounter (HOSPITAL_BASED_OUTPATIENT_CLINIC_OR_DEPARTMENT_OTHER): Payer: Self-pay | Admitting: Anesthesiology

## 2014-09-29 ENCOUNTER — Encounter (HOSPITAL_BASED_OUTPATIENT_CLINIC_OR_DEPARTMENT_OTHER)
Admission: RE | Disposition: A | Discharge status: Home / Self Care | Payer: Self-pay | Source: Ambulatory Visit | Attending: General Surgery

## 2014-09-29 ENCOUNTER — Ambulatory Visit (HOSPITAL_COMMUNITY)
Admission: RE | Admit: 2014-09-29 | Discharge: 2014-09-29 | Disposition: A | Discharge status: Home / Self Care | Payer: PPO | Source: Ambulatory Visit | Attending: General Surgery | Admitting: General Surgery

## 2014-09-29 ENCOUNTER — Ambulatory Visit (HOSPITAL_BASED_OUTPATIENT_CLINIC_OR_DEPARTMENT_OTHER): Payer: PPO | Admitting: Anesthesiology

## 2014-09-29 DIAGNOSIS — C50911 Malignant neoplasm of unspecified site of right female breast: Secondary | ICD-10-CM | POA: Insufficient documentation

## 2014-09-29 DIAGNOSIS — E079 Disorder of thyroid, unspecified: Secondary | ICD-10-CM | POA: Diagnosis not present

## 2014-09-29 DIAGNOSIS — I10 Essential (primary) hypertension: Secondary | ICD-10-CM | POA: Diagnosis not present

## 2014-09-29 DIAGNOSIS — Z86711 Personal history of pulmonary embolism: Secondary | ICD-10-CM | POA: Insufficient documentation

## 2014-09-29 DIAGNOSIS — G43909 Migraine, unspecified, not intractable, without status migrainosus: Secondary | ICD-10-CM | POA: Diagnosis not present

## 2014-09-29 DIAGNOSIS — G4730 Sleep apnea, unspecified: Secondary | ICD-10-CM | POA: Insufficient documentation

## 2014-09-29 DIAGNOSIS — Z87891 Personal history of nicotine dependence: Secondary | ICD-10-CM | POA: Insufficient documentation

## 2014-09-29 DIAGNOSIS — F329 Major depressive disorder, single episode, unspecified: Secondary | ICD-10-CM | POA: Insufficient documentation

## 2014-09-29 DIAGNOSIS — M1990 Unspecified osteoarthritis, unspecified site: Secondary | ICD-10-CM | POA: Diagnosis not present

## 2014-09-29 DIAGNOSIS — E780 Pure hypercholesterolemia: Secondary | ICD-10-CM | POA: Insufficient documentation

## 2014-09-29 HISTORY — DX: Low back pain: M54.5

## 2014-09-29 HISTORY — DX: Hyperlipidemia, unspecified: E78.5

## 2014-09-29 HISTORY — DX: Low back pain, unspecified: M54.50

## 2014-09-29 HISTORY — DX: Sjogren syndrome, unspecified: M35.00

## 2014-09-29 HISTORY — DX: Malignant neoplasm of unspecified site of unspecified female breast: C50.919

## 2014-09-29 HISTORY — DX: Personal history of other venous thrombosis and embolism: Z86.718

## 2014-09-29 HISTORY — PX: RE-EXCISION OF BREAST LUMPECTOMY: SHX6048

## 2014-09-29 HISTORY — DX: Other chronic pain: G89.29

## 2014-09-29 HISTORY — DX: Other specified personal risk factors, not elsewhere classified: Z91.89

## 2014-09-29 HISTORY — DX: Localized edema: R60.0

## 2014-09-29 SURGERY — EXCISION, LESION, BREAST
Anesthesia: General | Site: Breast | Laterality: Right

## 2014-09-29 MED ORDER — ACETAMINOPHEN 325 MG PO TABS
650.0000 mg | ORAL_TABLET | ORAL | Status: DC | PRN
  Filled 2014-09-29: qty 2

## 2014-09-29 MED ORDER — MIDAZOLAM HCL 5 MG/5ML IJ SOLN
INTRAMUSCULAR | Status: DC | PRN
  Administered 2014-09-29: 2 mg via INTRAVENOUS

## 2014-09-29 MED ORDER — MIDAZOLAM HCL 2 MG/2ML IJ SOLN
INTRAMUSCULAR | Status: AC
  Filled 2014-09-29: qty 2

## 2014-09-29 MED ORDER — OXYCODONE HCL 5 MG PO TABS
5.0000 mg | ORAL_TABLET | ORAL | Status: DC | PRN
  Filled 2014-09-29: qty 2

## 2014-09-29 MED ORDER — DEXAMETHASONE SODIUM PHOSPHATE 4 MG/ML IJ SOLN
INTRAMUSCULAR | Status: DC | PRN
  Administered 2014-09-29: 10 mg via INTRAVENOUS

## 2014-09-29 MED ORDER — EPHEDRINE SULFATE 50 MG/ML IJ SOLN
INTRAMUSCULAR | Status: DC | PRN
  Administered 2014-09-29: 25 mg via INTRAVENOUS
  Administered 2014-09-29: 10 mg via INTRAVENOUS
  Administered 2014-09-29: 15 mg via INTRAVENOUS

## 2014-09-29 MED ORDER — ONDANSETRON HCL 4 MG/2ML IJ SOLN
INTRAMUSCULAR | Status: DC | PRN
  Administered 2014-09-29: 4 mg via INTRAVENOUS

## 2014-09-29 MED ORDER — CIPROFLOXACIN IN D5W 400 MG/200ML IV SOLN
400.0000 mg | INTRAVENOUS | Status: AC
  Administered 2014-09-29: 400 mg via INTRAVENOUS
  Filled 2014-09-29: qty 200

## 2014-09-29 MED ORDER — LIDOCAINE HCL 1 % IJ SOLN
INTRAMUSCULAR | Status: DC | PRN
  Administered 2014-09-29: 20 mL

## 2014-09-29 MED ORDER — FENTANYL CITRATE (PF) 100 MCG/2ML IJ SOLN
INTRAMUSCULAR | Status: AC
  Filled 2014-09-29: qty 2

## 2014-09-29 MED ORDER — SODIUM CHLORIDE 0.9 % IV SOLN
250.0000 mL | INTRAVENOUS | Status: DC | PRN
  Filled 2014-09-29: qty 250

## 2014-09-29 MED ORDER — SODIUM CHLORIDE 0.9 % IJ SOLN
3.0000 mL | INTRAMUSCULAR | Status: DC | PRN
  Filled 2014-09-29: qty 3

## 2014-09-29 MED ORDER — OXYCODONE-ACETAMINOPHEN 10-325 MG PO TABS: 1.0000 | ORAL_TABLET | ORAL | Status: DC | PRN

## 2014-09-29 MED ORDER — FENTANYL CITRATE (PF) 100 MCG/2ML IJ SOLN
INTRAMUSCULAR | Status: DC | PRN
  Administered 2014-09-29 (×2): 50 ug via INTRAVENOUS

## 2014-09-29 MED ORDER — FENTANYL CITRATE (PF) 100 MCG/2ML IJ SOLN
INTRAMUSCULAR | Status: AC
  Filled 2014-09-29: qty 4

## 2014-09-29 MED ORDER — PROMETHAZINE HCL 25 MG/ML IJ SOLN
6.2500 mg | INTRAMUSCULAR | Status: DC | PRN
  Filled 2014-09-29: qty 1

## 2014-09-29 MED ORDER — LIDOCAINE HCL (CARDIAC) 20 MG/ML IV SOLN
INTRAVENOUS | Status: DC | PRN
  Administered 2014-09-29: 50 mg via INTRAVENOUS

## 2014-09-29 MED ORDER — FENTANYL CITRATE (PF) 100 MCG/2ML IJ SOLN
25.0000 ug | INTRAMUSCULAR | Status: DC | PRN
  Administered 2014-09-29 (×2): 25 ug via INTRAVENOUS
  Filled 2014-09-29: qty 1

## 2014-09-29 MED ORDER — ACETAMINOPHEN 650 MG RE SUPP
650.0000 mg | RECTAL | Status: DC | PRN
  Filled 2014-09-29: qty 1

## 2014-09-29 MED ORDER — PROPOFOL 10 MG/ML IV BOLUS
INTRAVENOUS | Status: DC | PRN
  Administered 2014-09-29: 150 mg via INTRAVENOUS

## 2014-09-29 MED ORDER — LACTATED RINGERS IV SOLN
INTRAVENOUS | Status: DC
  Administered 2014-09-29 (×2): via INTRAVENOUS
  Filled 2014-09-29: qty 1000

## 2014-09-29 MED ORDER — SODIUM CHLORIDE 0.9 % IJ SOLN
3.0000 mL | Freq: Two times a day (BID) | INTRAMUSCULAR | Status: DC
  Filled 2014-09-29: qty 3

## 2014-09-29 MED ORDER — PHENYLEPHRINE HCL 10 MG/ML IJ SOLN
INTRAMUSCULAR | Status: DC | PRN
  Administered 2014-09-29 (×2): 80 ug via INTRAVENOUS

## 2014-09-29 MED ORDER — CIPROFLOXACIN IN D5W 400 MG/200ML IV SOLN
INTRAVENOUS | Status: AC
  Filled 2014-09-29: qty 200

## 2014-09-29 SURGICAL SUPPLY — 55 items
BINDER BREAST LRG (GAUZE/BANDAGES/DRESSINGS) ×1 IMPLANT
BLADE HEX COATED 2.75 (ELECTRODE) ×2 IMPLANT
BLADE SURG 15 STRL LF DISP TIS (BLADE) ×1 IMPLANT
BLADE SURG 15 STRL SS (BLADE) ×2
CANISTER SUCTION 1200CC (MISCELLANEOUS) ×2 IMPLANT
CHLORAPREP W/TINT 26ML (MISCELLANEOUS) ×2 IMPLANT
CLIP TI MEDIUM 6 (CLIP) ×1 IMPLANT
CLIP TI WIDE RED SMALL 6 (CLIP) IMPLANT
CLOTH BEACON ORANGE TIMEOUT ST (SAFETY) ×2 IMPLANT
CONT SPEC 4OZ CLIKSEAL STRL BL (MISCELLANEOUS) ×4 IMPLANT
COVER BACK TABLE 60X90IN (DRAPES) ×2 IMPLANT
COVER MAYO STAND STRL (DRAPES) ×2 IMPLANT
DEVICE DUBIN W/COMP PLATE 8390 (MISCELLANEOUS) IMPLANT
DRAPE PED LAPAROTOMY (DRAPES) ×2 IMPLANT
DRAPE UTILITY XL STRL (DRAPES) ×2 IMPLANT
DRSG PAD ABDOMINAL 8X10 ST (GAUZE/BANDAGES/DRESSINGS) ×1 IMPLANT
ELECT REM PT RETURN 9FT ADLT (ELECTROSURGICAL) ×2
ELECTRODE REM PT RTRN 9FT ADLT (ELECTROSURGICAL) ×1 IMPLANT
GAUZE SPONGE 4X4 12PLY STRL LF (GAUZE/BANDAGES/DRESSINGS) ×2 IMPLANT
GLOVE BIO SURGEON STRL SZ 6 (GLOVE) ×1 IMPLANT
GLOVE BIOGEL PI IND STRL 6.5 (GLOVE) ×1 IMPLANT
GLOVE BIOGEL PI INDICATOR 6.5 (GLOVE) ×2
GLOVE SURG SS PI 6.0 STRL IVOR (GLOVE) ×1 IMPLANT
GLOVE SURG SS PI 6.5 STRL IVOR (GLOVE) ×1 IMPLANT
GOWN STRL REUS W/ TWL XL LVL3 (GOWN DISPOSABLE) ×1 IMPLANT
GOWN STRL REUS W/TWL 2XL LVL3 (GOWN DISPOSABLE) ×2 IMPLANT
GOWN STRL REUS W/TWL XL LVL3 (GOWN DISPOSABLE) ×2
KIT MARKER MARGIN INK (KITS) ×1 IMPLANT
LIQUID BAND (GAUZE/BANDAGES/DRESSINGS) ×1 IMPLANT
MANIFOLD NEPTUNE II (INSTRUMENTS) IMPLANT
NDL HYPO 25X1 1.5 SAFETY (NEEDLE) ×1 IMPLANT
NEEDLE HYPO 25X1 1.5 SAFETY (NEEDLE) ×2 IMPLANT
NS IRRIG 1000ML POUR BTL (IV SOLUTION) ×2 IMPLANT
PACK BASIN DAY SURGERY FS (CUSTOM PROCEDURE TRAY) ×2 IMPLANT
PENCIL BUTTON HOLSTER BLD 10FT (ELECTRODE) ×2 IMPLANT
SLEEVE SCD COMPRESS KNEE MED (MISCELLANEOUS) ×2 IMPLANT
SPONGE GAUZE 4X4 12PLY STER LF (GAUZE/BANDAGES/DRESSINGS) ×1 IMPLANT
SPONGE GAUZE 4X4 16PLY UNSTER (WOUND CARE) ×4 IMPLANT
SPONGE LAP 18X18 X RAY DECT (DISPOSABLE) ×2 IMPLANT
STAPLER VISISTAT 35W (STAPLE) IMPLANT
STRIP CLOSURE SKIN 1/2X4 (GAUZE/BANDAGES/DRESSINGS) ×2 IMPLANT
SUT MON AB 4-0 PC3 18 (SUTURE) ×2 IMPLANT
SUT SILK 2 0 SH (SUTURE) IMPLANT
SUT VIC AB 3-0 54X BRD REEL (SUTURE) IMPLANT
SUT VIC AB 3-0 BRD 54 (SUTURE)
SUT VIC AB 3-0 SH 27 (SUTURE) ×2
SUT VIC AB 3-0 SH 27X BRD (SUTURE) ×1 IMPLANT
SYR BULB 3OZ (MISCELLANEOUS) ×2 IMPLANT
SYR CONTROL 10ML LL (SYRINGE) ×1 IMPLANT
TOWEL OR 17X24 6PK STRL BLUE (TOWEL DISPOSABLE) ×2 IMPLANT
TOWEL OR NON WOVEN STRL DISP B (DISPOSABLE) ×2 IMPLANT
TUBE CONNECTING 12X1/4 (SUCTIONS) ×2 IMPLANT
VECTOR SURGICAL ×1 IMPLANT
WATER STERILE IRR 1000ML POUR (IV SOLUTION) ×2 IMPLANT
YANKAUER SUCT BULB TIP NO VENT (SUCTIONS) ×2 IMPLANT

## 2014-09-29 NOTE — Discharge Instructions (Addendum)
[facility] °Office Phone Number [phone] ° °BREAST BIOPSY/ PARTIAL MASTECTOMY: POST OP INSTRUCTIONS ° °Always review your discharge instruction sheet given to you by the facility where your surgery was performed. ° °IF YOU HAVE DISABILITY OR FAMILY LEAVE FORMS, YOU MUST BRING THEM TO THE OFFICE FOR PROCESSING.  DO NOT GIVE THEM TO YOUR DOCTOR. ° °1. A prescription for pain medication may be given to you upon discharge.  Take your pain medication as prescribed, if needed.  If narcotic pain medicine is not needed, then you may take acetaminophen (Tylenol) or ibuprofen (Advil) as needed. °2. Take your usually prescribed medications unless otherwise directed °3. If you need a refill on your pain medication, please contact your pharmacy.  They will contact our office to request authorization.  Prescriptions will not be filled after 5pm or on week-ends. °4. You should eat very light the first 24 hours after surgery, such as soup, crackers, pudding, etc.  Resume your normal diet the day after surgery. °5. Most patients will experience some swelling and bruising in the breast.  Ice packs and a good support bra will help.  Swelling and bruising can take several days to resolve.  °6. It is common to experience some constipation if taking pain medication after surgery.  Increasing fluid intake and taking a stool softener will usually help or prevent this problem from occurring.  A mild laxative (Milk of Magnesia or Miralax) should be taken according to package directions if there are no bowel movements after 48 hours. °7. Unless discharge instructions indicate otherwise, you may remove your bandages 48 hours after surgery, and you may shower at that time.  You may have steri-strips (small skin tapes) in place directly over the incision.  These strips should be left on the skin for 7-10 days.   Any sutures or staples will be removed at the office during your follow-up visit. °8. ACTIVITIES:  You may resume  regular daily activities (gradually increasing) beginning the next day.  Wearing a good support bra or sports bra (or the breast binder) minimizes pain and swelling.  You may have sexual intercourse when it is comfortable. °a. You may drive when you no longer are taking prescription pain medication, you can comfortably wear a seatbelt, and you can safely maneuver your car and apply brakes. °b. RETURN TO WORK:  __________1 week_______________ °9. You should see your doctor in the office for a follow-up appointment approximately two weeks after your surgery.  Your doctor’s nurse will typically make your follow-up appointment when she calls you with your pathology report.  Expect your pathology report 2-3 business days after your surgery.  You may call to check if you do not hear from us after three days. ° ° °WHEN TO CALL YOUR DOCTOR: °1. Fever over 101.0 °2. Nausea and/or vomiting. °3. Extreme swelling or bruising. °4. Continued bleeding from incision. °5. Increased pain, redness, or drainage from the incision. ° °The clinic staff is available to answer your questions during regular business hours.  Please don’t hesitate to call and ask to speak to one of the nurses for clinical concerns.  If you have a medical emergency, go to the nearest emergency room or call 911.  A surgeon from [facility] is always on call at the hospital. ° °For further questions, please visit [email or web address]  ° ° °Post Anesthesia Home Care Instructions ° °Activity: °Get plenty of rest for the remainder of the day. A responsible adult should stay with you for 24   hours following the procedure.  °For the next 24 hours, DO NOT: °-Drive a car °-Operate machinery °-Drink alcoholic beverages °-Take any medication unless instructed by your physician °-Make any legal decisions or sign important papers. ° °Meals: °Start with liquid foods such as gelatin or soup. Progress to regular foods as tolerated. Avoid greasy, spicy, heavy  foods. If nausea and/or vomiting occur, drink only clear liquids until the nausea and/or vomiting subsides. Call your physician if vomiting continues. ° °Special Instructions/Symptoms: °Your throat may feel dry or sore from the anesthesia or the breathing tube placed in your throat during surgery. If this causes discomfort, gargle with warm salt water. The discomfort should disappear within 24 hours. ° °If you had a scopolamine patch placed behind your ear for the management of post- operative nausea and/or vomiting: ° °1. The medication in the patch is effective for 72 hours, after which it should be removed.  Wrap patch in a tissue and discard in the trash. Wash hands thoroughly with soap and water. °2. You may remove the patch earlier than 72 hours if you experience unpleasant side effects which may include dry mouth, dizziness or visual disturbances. °3. Avoid touching the patch. Wash your hands with soap and water after contact with the patch. °  ° °

## 2014-09-29 NOTE — Anesthesia Postprocedure Evaluation (Signed)
  Anesthesia Post-op Note  Patient: Connie Bennett  Procedure(s) Performed: Procedure(s) (LRB): RE-EXCISION OF BREAST LUMPECTOMY (Right)  Patient Location: PACU  Anesthesia Type: General  Level of Consciousness: awake and alert   Airway and Oxygen Therapy: Patient Spontanous Breathing  Post-op Pain: mild  Post-op Assessment: Post-op Vital signs reviewed, Patient's Cardiovascular Status Stable, Respiratory Function Stable, Patent Airway and No signs of Nausea or vomiting  Last Vitals:  Filed Vitals:   09/29/14 1020  BP: 121/61  Pulse: 88  Temp: 36.8 C  Resp: 16    Post-op Vital Signs: stable   Complications: No apparent anesthesia complications

## 2014-09-29 NOTE — Anesthesia Preprocedure Evaluation (Addendum)
Anesthesia Evaluation  Patient identified by MRN, date of birth, ID band Patient awake    Reviewed: Allergy & Precautions, NPO status , Patient's Chart, lab work & pertinent test results  Airway Mallampati: II  TM Distance: >3 FB Neck ROM: Full    Dental no notable dental hx. (+) Lower Dentures, Dental Advisory Given   Pulmonary sleep apnea , former smoker,  breath sounds clear to auscultation  Pulmonary exam normal       Cardiovascular hypertension, Pt. on medications Normal cardiovascular examRhythm:Regular Rate:Normal     Neuro/Psych  Headaches, PSYCHIATRIC DISORDERS Anxiety Depression  Neuromuscular disease    GI/Hepatic negative GI ROS, Neg liver ROS,   Endo/Other  negative endocrine ROS  Renal/GU negative Renal ROS  negative genitourinary   Musculoskeletal  (+) Arthritis -, Fibromyalgia -  Abdominal   Peds negative pediatric ROS (+)  Hematology negative hematology ROS (+)   Anesthesia Other Findings   Reproductive/Obstetrics negative OB ROS                           Anesthesia Physical Anesthesia Plan  ASA: II  Anesthesia Plan: General   Post-op Pain Management:    Induction: Intravenous  Airway Management Planned: LMA  Additional Equipment:   Intra-op Plan:   Post-operative Plan: Extubation in OR  Informed Consent: I have reviewed the patients History and Physical, chart, labs and discussed the procedure including the risks, benefits and alternatives for the proposed anesthesia with the patient or authorized representative who has indicated his/her understanding and acceptance.   Dental advisory given  Plan Discussed with: CRNA  Anesthesia Plan Comments:         Anesthesia Quick Evaluation

## 2014-09-29 NOTE — Interval H&P Note (Signed)
History and Physical Interval Note:  09/29/2014 7:32 AM  Connie Bennett  has presented today for surgery, with the diagnosis of RIGHT BREAST CANCER  The various methods of treatment have been discussed with the patient and family. After consideration of risks, benefits and other options for treatment, the patient has consented to  Procedure(s): RE-EXCISION OF BREAST LUMPECTOMY (Right) as a surgical intervention .  The patient's history has been reviewed, patient examined, no change in status, stable for surgery.  I have reviewed the patient's chart and labs.  Questions were answered to the patient's satisfaction.     Lamin Chandley

## 2014-09-29 NOTE — Op Note (Signed)
Re-excisional Right Breast Lumpectomy   Indications: This patient presents with history of positive margins after partial mastectomy for Right breast cancer   Pre-operative Diagnosis: Right breast cancer , pTis  Post-operative Diagnosis: Right breast cancer   Surgeon: [name]   Assistants: n/a   Anesthesia: General anesthesia and Local anesthesia 0.5% bupivacaine, with epinephrine   ASA Class: 2   Procedure Details  The patient was seen in the Holding Room. The risks, benefits, complications, treatment options, and expected outcomes were discussed with the patient. The possibilities of reaction to medication, pulmonary aspiration, bleeding, infection, the need for additional procedures, failure to diagnose a condition, and creating a complication requiring transfusion or operation were discussed with the patient. The patient concurred with the proposed plan, giving informed consent. The site of surgery properly noted/marked. The patient was taken to Operating Room # 3, identified, and the procedure verified as re-excision of right breast cancer.  After induction of anesthesia, the right breast and chest were prepped and draped in standard fashion.  The lumpectomy was performed by reopening the prior incision. Seroma was aspirated. The mastopexy sutures were removed. Additional margins were taken at anterior, posterior, medial, lateral, and inferior borders of the partial mastectomy cavity. Dissection was carried down to the pectoral fascia. The specimens were marked with ink.  Clips were placed in the lumpectomy cavity.  Hemostasis was achieved with cautery. The wound was irrigated and closed with a 3-0 Vicryl deep dermal interrupted and a 4-0 Monocryl subcuticular closure in layers.  Sterile dressings were applied. At the end of the operation, all sponge, instrument, and needle counts were correct.   Findings:  grossly clear surgical margins, anterior margin now skin, posterior margin  is muscle.    Estimated Blood Loss: Minimal   Drains: none   Specimens: additional  Margins as above.  .   Complications: None; patient tolerated the procedure well.   Disposition: PACU - hemodynamically stable.   Condition: stable

## 2014-09-29 NOTE — Transfer of Care (Signed)
Last Vitals:  Filed Vitals:   09/29/14 0903  BP:   Pulse:   Temp: 36.7 C  Resp:     Immediate Anesthesia Transfer of Care Note  Patient: Connie Bennett  Procedure(s) Performed: Procedure(s) (LRB): RE-EXCISION OF BREAST LUMPECTOMY (Right)  Patient Location: PACU  Anesthesia Type: General  Level of Consciousness: awake, alert  and oriented  Airway & Oxygen Therapy: Patient Spontanous Breathing and Patient connected to face mask oxygen  Post-op Assessment: Report given to PACU RN and Post -op Vital signs reviewed and stable  Post vital signs: Reviewed and stable  Complications: No apparent anesthesia complications

## 2014-09-29 NOTE — Anesthesia Procedure Notes (Signed)
Procedure Name: LMA Insertion Date/Time: 09/29/2014 7:46 AM Performed by: Mechele Claude Pre-anesthesia Checklist: Patient identified, Emergency Drugs available, Suction available and Patient being monitored Patient Re-evaluated:Patient Re-evaluated prior to inductionOxygen Delivery Method: Circle System Utilized Preoxygenation: Pre-oxygenation with 100% oxygen Intubation Type: IV induction Ventilation: Mask ventilation without difficulty LMA: LMA inserted LMA Size: 4.0 Number of attempts: 1 Airway Equipment and Method: bite block Placement Confirmation: positive ETCO2 Tube secured with: Tape Dental Injury: Teeth and Oropharynx as per pre-operative assessment

## 2014-09-29 NOTE — H&P (View-Only) (Signed)
Connie Bennett 09/06/2014 7:47 AM Location: [facility] Patient #: [id number] DOB: 05/20/1950 Undefined / Language: [name] / Race: Undefined Female  History of Present Illness [name] MD; 09/06/2014 9:49 AM) The patient is a 64 year old female who presents with breast cancer. Patient is a 64 yo F referred by Dr. Brigitte Pulse for consultation regarding a palpable right breast mass and a new diagnosis of cancer. She also had undergone a fall. She underwent diagnostic mammography and ultrasound of the affected breast. She had a cyst located at 8:30 in the right breast that was aspirated. She had two areas in the upper outer right breast. One was 7 mm, and the other was 5 mm. They were very close together, but appeared to be separate. Clips were 1.5 cm apart on clip mammogram. Both areas were biopsied, and they showed similar high grade DCIS with suspicion for early microinvasion. The DCIS was hormone receptor negative. She has no family history of breast cancer. Her mother had a rare type of liver cancer. She had menarche at age 26. Menopause was in the early 31s. She is a G1P1 with first child at age 44-20. She did use oral contraceptives.  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 She also has had spine fusion and a repair of ruptured disk. She is a previous smoker.    Other Problems Anderson Malta [place], RMA; 09/06/2014 7:48 AM) Arthritis Back Pain Bladder Problems Breast Cancer Depression Heart murmur Hemorrhoids High blood pressure Hypercholesterolemia Lump In Breast Migraine Headache Pulmonary Embolism / Blood Clot in Legs Thyroid Disease Ventral Hernia Repair  Past Surgical History Anderson Malta [place], RMA; 09/06/2014 7:48 AM) Breast Biopsy Right. Colon Polyp Removal - Colonoscopy Foot Surgery Bilateral. Oral Surgery Spinal Surgery - Lower Back Spinal Surgery Midback Thyroid Surgery Tonsillectomy  Diagnostic  Studies History Anderson Malta [place], RMA; 09/06/2014 7:48 AM) Colonoscopy 1-5 years ago Mammogram within last year  Social History Anderson Malta [place], RMA; 09/06/2014 7:48 AM) Caffeine use Tea. No alcohol use No drug use Tobacco use Never smoker.  Family History Anderson Malta [place], [place]; 09/06/2014 7:48 AM) Arthritis Mother. Cancer Mother. Hypertension Mother. Kidney Disease Mother. Thyroid problems Mother.  Pregnancy / Birth History Anderson Malta [place], [place]; 09/06/2014 7:48 AM) Age at menarche 22 years. Age of menopause 51-55 Contraceptive History Oral contraceptives. Gravida 1 Maternal age 24-20 Para 1  Review of Systems Anderson Malta [place] RMA; 09/06/2014 7:48 AM) General Present- Appetite Loss and Fatigue. Not Present- Chills, Fever, Night Sweats, Weight Gain and Weight Loss. Skin Not Present- Change in Wart/Mole, Dryness, Hives, Jaundice, New Lesions, Non-Healing Wounds, Rash and Ulcer. HEENT Present- Seasonal Allergies and Wears glasses/contact lenses. Not Present- Earache, Hearing Loss, Hoarseness, Nose Bleed, Oral Ulcers, Ringing in the Ears, Sinus Pain, Sore Throat, Visual Disturbances and Yellow Eyes. Respiratory Present- Snoring. Not Present- Bloody sputum, Chronic Cough, Difficulty Breathing and Wheezing. Breast Present- Breast Mass. Not Present- Breast Pain, Nipple Discharge and Skin Changes. Cardiovascular Present- Swelling of Extremities. Not Present- Chest Pain, Difficulty Breathing Lying Down, Leg Cramps, Palpitations, Rapid Heart Rate and Shortness of Breath. Gastrointestinal Present- Hemorrhoids. Not Present- Abdominal Pain, Bloating, Bloody Stool, Change in Bowel Habits, Chronic diarrhea, Constipation, Difficulty Swallowing, Excessive gas, Gets full quickly at meals, Indigestion, Nausea, Rectal Pain and Vomiting. Female Genitourinary Not Present- Frequency, Nocturia, Painful Urination, Pelvic Pain and Urgency. Musculoskeletal Present- Back Pain, Joint Pain and Muscle Pain.  Not Present- Joint Stiffness, Muscle Weakness and Swelling of Extremities. Neurological Present- Headaches and Trouble walking.  Not Present- Decreased Memory, Fainting, Numbness, Seizures, Tingling, Tremor and Weakness. Psychiatric Present- Anxiety, Depression and Fearful. Not Present- Bipolar, Change in Sleep Pattern and Frequent crying. Endocrine Not Present- Cold Intolerance, Excessive Hunger, Hair Changes, Heat Intolerance, Hot flashes and New Diabetes. Hematology Not Present- Easy Bruising, Excessive bleeding, Gland problems, HIV and Persistent Infections.   Physical Exam [name] MD; 09/06/2014 9:51 AM) General Mental Status-Alert. General Appearance-Consistent with stated age. Hydration-Well hydrated. Voice-Normal.  Head and Neck Head-normocephalic, atraumatic with no lesions or palpable masses. Trachea-midline. Thyroid Gland Characteristics - normal size and consistency.  Eye Eyeball - Bilateral-Extraocular movements intact. Sclera/Conjunctiva - Bilateral-No scleral icterus.  Chest and Lung Exam Chest and lung exam reveals -quiet, even and easy respiratory effort with no use of accessory muscles and on auscultation, normal breath sounds, no adventitious sounds and normal vocal resonance. Inspection Chest Wall - Normal. Back - normal.  Breast Note: Small palpable mass in the lower outer quadrant of the right breast consistent with cyst location. She has no nipple retraction, no axillary lymphadenopathy. She does not have any skin dimpling or nipple discharge. left breast is normal. Breasts are symmetric bilaterally.   Cardiovascular Cardiovascular examination reveals -normal heart sounds, regular rate and rhythm with no murmurs and normal pedal pulses bilaterally.  Abdomen Inspection Inspection of the abdomen reveals - No Hernias. Palpation/Percussion Palpation and Percussion of the abdomen reveal - Soft, Non Tender, No Rebound tenderness, No  Rigidity (guarding) and No hepatosplenomegaly. Auscultation Auscultation of the abdomen reveals - Bowel sounds normal.  Neurologic Neurologic evaluation reveals -alert and oriented x 3 with no impairment of recent or remote memory. Mental Status-Normal.  Musculoskeletal Global Assessment -Note: no gross deformities, but slight kyphosis.  Normal Exam - Left-Upper Extremity Strength Normal and Lower Extremity Strength Normal. Normal Exam - Right-Upper Extremity Strength Normal and Lower Extremity Strength Normal.  Lymphatic Head & Neck  General Head & Neck Lymphatics: Bilateral - Description - Normal. Axillary  General Axillary Region: Bilateral - Description - Normal. Tenderness - Non Tender. Femoral & Inguinal  Generalized Femoral & Inguinal Lymphatics: Bilateral - Description - No Generalized lymphadenopathy.    Assessment & Plan [name] MD; 09/06/2014 9:53 AM) PRIMARY CANCER OF RIGHT FEMALE BREAST (174.9  C50.911) Impression: Patient has a new diagnosis of Stage 0 breast cancer, cTis. Her stage may be upgraded if the suspicion for microinvasion is borne out on final pathology. We will recommend breast conservation therapy with a seed localized lumpectomy and sentinel lymph node biopsy. Her treatment plan will also include external beam radiation. Unless her final pathology changes, she would not be recommended to receive antiestrogen therapy.  The surgical procedure was described to the patient. I discussed the incision types and location and that we would need radiology involved with a wire or seed marker and sentinel node.  The risks and benefits of the procedure were described to the patient and she wishes to proceed.  We discussed the risks bleeding, infection, damage to other structures, need for further procedures/surgeries. We discussed the risk of seroma. The patient was advised that we may need to go back to surgery for additional tissue to obtain  negative margins or for additional lymph nodes. The patient was advised that these are the most common complications, but that others can occur as well. She was advised against taking aspirin or other anti-inflammatory agents/blood thinners the week before surgery.  45 min spent in examination, evaluation, counseling, and coordination of care. >50% spent in counseling. Current Plans  Schedule for Surgery Pt Education - [facility] breast cancer surgery: discussed with patient and provided information.   Signed by [name], MD (09/06/2014 9:55 AM)

## 2014-10-03 ENCOUNTER — Encounter (HOSPITAL_BASED_OUTPATIENT_CLINIC_OR_DEPARTMENT_OTHER): Payer: Self-pay | Admitting: General Surgery

## 2014-10-03 NOTE — Progress Notes (Signed)
Location of Breast Cancer:Right upper-outer breast.stage 0 DCIS  Histology per Pathology Report:09/13/14 NAL DIAGNOSIS Diagnosis 1. Breast, lumpectomy, Right - HIGH GRADE DUCTAL CARCINOMA IN SITU WITH NECROSIS AND CALCIFICATIONS, SEE COMMENT. - TUMOR IS FOCALLY PRESENT AT LATERAL MARGIN. - TUMOR IS BROADLY PRESENT AT MEDIAL MARGIN. - TUMOR IS 1 MM FROM NEAREST ANTERIOR, POSTERIOR, AND INFERIOR MARGIN. - PREVIOUS BIOPSY SITE. - SEE TUMOR SYNOPTIC TEMPLATE BELOW. 2. Lymph node, sentinel, biopsy, Right axilla #1 - ONE LYMPH NODE, NEGATIVE FOR TUMOR (0/1). 3. Lymph node, sentinel, biopsy, Right axilla #2 - ONE LYMPH NODE, NEGATIVE FOR TUMOR (0/1). 4. Lymph node, sentinel, biopsy, Right axilla #3 - ONE LYMPH NODE, NEGATIVE FOR TUMOR (0/1). 5. Lymph node, sentinel, biopsy, Right axilla #4 - ONE LYMPH NODE, NEGATIVE FOR TUMOR (0/1). 6. Lymph node, sentinel, biopsy, Right axilla #5 - ONE LYMPH NODE, NEGATIVE FOR TUMOR (0/1).  Receptor Status: ER(-), PR (-), Her2-neu ()  Did patient present with symptoms (if so, please note symptoms) or was this found on screening mammography?:found by primary physician.on assessment she had a benign cyst aspirated and 2 small lesions were seen on ultrasound.  Past/Anticipated interventions by surgeon, if any:09/13/14 [place] LYMPH NODE BIOPSY.09/29/14 RE-EXCISION OF BREAST LUMPECTOMY (Right Breast)  Past/Anticipated interventions by medical oncology, if any: Chemotherapy   Lymphedema issues, if any:No  Pain issues, if DKC:[id number] pain fibromyalgia and RSD  SAFETY ISSUES:  Prior radiation? No  Pacemaker/ICD?No  Possible current pregnancy?No  Is the patient on methotrexate? No  Current Complaints / other details:Married to second husband x 20 years.Menarche age 51.First live birth age 56.G1 P1.Hormone replacement therapy 1 year 15 years ago. Paternal aunt had breast cancer.see family  history. Has pain stimulator in backl. Quit smoking in February 2016. Arlyss Repress, RN 10/03/2014,4:27 PM

## 2014-10-04 ENCOUNTER — Ambulatory Visit
Admission: RE | Admit: 2014-10-04 | Discharge: 2014-10-04 | Disposition: A | Discharge status: Home / Self Care | Payer: PPO | Source: Ambulatory Visit | Attending: Radiation Oncology | Admitting: Radiation Oncology

## 2014-10-04 ENCOUNTER — Encounter: Payer: Self-pay | Admitting: Radiation Oncology

## 2014-10-04 VITALS — BP 125/110 | HR 108 | Temp 98.1°F | Resp 20 | Ht 62.5 in | Wt 152.7 lb

## 2014-10-04 DIAGNOSIS — C50419 Malignant neoplasm of upper-outer quadrant of unspecified female breast: Secondary | ICD-10-CM | POA: Insufficient documentation

## 2014-10-04 DIAGNOSIS — C50411 Malignant neoplasm of upper-outer quadrant of right female breast: Secondary | ICD-10-CM

## 2014-10-04 NOTE — Progress Notes (Signed)
Please see the Nurse Progress Note in the MD Initial Consult Encounter for this patient. 

## 2014-10-04 NOTE — Addendum Note (Signed)
Encounter addended by: Norm Salt, RN on: 10/04/2014  4:24 PM<BR>     Documentation filed: Charges VN

## 2014-10-04 NOTE — Progress Notes (Signed)
   Department of Radiation Oncology  Phone:  [phone] Fax:        [phone]   Name: Connie Bennett MRN: [id number]  DOB: 1951/02/24  Date: 10/04/2014  Follow Up Visit Note  Diagnosis: Breast cancer of upper-outer quadrant of right female breast   Staging form: Breast, AJCC 7th Edition     Clinical stage from 09/06/2014: Stage 0 (Tis (DCIS), N0, M0) - Unsigned  Interval History: Connie Bennett presents today for routine followup. She underwent a lumpectomy on 6/15. This showed a focus of high grade ductal carcinoma in situ. She had 5 lymph nodes that were negative for tumor. She then underwent re-excision on 7/1. After talking with pathology she continues to have multiple positive margins which will require re-excision. She is present with her husband today.   Physical Exam:  Filed Vitals:   10/04/14 0835  BP: 125/110  Pulse: 108  Temp: 98.1 F (36.7 C)  TempSrc: Oral  Resp: 20  Height: 5' 2.5" (1.588 m)  Weight: 152 lb 11.2 oz (69.264 kg)   Steristrips in place on right breast.   IMPRESSION: Connie Bennett is a 64 y.o. female with high grade DCIS s/p lumpectomy and re-excision with continued positive margins.   PLAN:  She is going to go back and discuss re-excision with Dr. Barry Dienes on Monday vs. mastectomy. We discussed she had to have negative margins before we proceed with radiation. We discussed that she would not require radiation if she had a mastectomy and could have immediate reconstruction. We discussed that this DCIS was mammographically occult.   This document serves as a record of services personally performed by Thea Silversmith, MD. It was created on her behalf by Arlyce Harman, a trained medical scribe. The creation of this record is based on the scribe's personal observations and the provider's statements to them. This document has been checked and approved by the attending provider.      Thea Silversmith, MD

## 2014-10-13 ENCOUNTER — Ambulatory Visit: Payer: PPO | Admitting: Hematology

## 2014-10-30 ENCOUNTER — Other Ambulatory Visit: Payer: Self-pay | Admitting: General Surgery

## 2014-12-01 ENCOUNTER — Encounter (HOSPITAL_COMMUNITY): Payer: Self-pay

## 2014-12-05 ENCOUNTER — Other Ambulatory Visit (HOSPITAL_COMMUNITY): Payer: PPO

## 2014-12-06 ENCOUNTER — Encounter (HOSPITAL_COMMUNITY)
Admission: RE | Admit: 2014-12-06 | Discharge: 2014-12-06 | Disposition: A | Discharge status: Home / Self Care | Payer: PPO | Source: Ambulatory Visit | Attending: General Surgery | Admitting: General Surgery

## 2014-12-06 ENCOUNTER — Encounter (HOSPITAL_COMMUNITY): Payer: Self-pay

## 2014-12-06 ENCOUNTER — Other Ambulatory Visit (HOSPITAL_COMMUNITY): Payer: Self-pay | Admitting: Plastic Surgery

## 2014-12-06 DIAGNOSIS — E785 Hyperlipidemia, unspecified: Secondary | ICD-10-CM | POA: Insufficient documentation

## 2014-12-06 DIAGNOSIS — I4510 Unspecified right bundle-branch block: Secondary | ICD-10-CM | POA: Insufficient documentation

## 2014-12-06 DIAGNOSIS — Z79899 Other long term (current) drug therapy: Secondary | ICD-10-CM | POA: Diagnosis not present

## 2014-12-06 DIAGNOSIS — C50919 Malignant neoplasm of unspecified site of unspecified female breast: Secondary | ICD-10-CM | POA: Diagnosis not present

## 2014-12-06 DIAGNOSIS — Z01818 Encounter for other preprocedural examination: Secondary | ICD-10-CM | POA: Insufficient documentation

## 2014-12-06 DIAGNOSIS — M3500 Sicca syndrome, unspecified: Secondary | ICD-10-CM | POA: Insufficient documentation

## 2014-12-06 DIAGNOSIS — Z01812 Encounter for preprocedural laboratory examination: Secondary | ICD-10-CM | POA: Insufficient documentation

## 2014-12-06 DIAGNOSIS — Z87891 Personal history of nicotine dependence: Secondary | ICD-10-CM | POA: Insufficient documentation

## 2014-12-06 DIAGNOSIS — I10 Essential (primary) hypertension: Secondary | ICD-10-CM | POA: Insufficient documentation

## 2014-12-06 DIAGNOSIS — Z7982 Long term (current) use of aspirin: Secondary | ICD-10-CM | POA: Diagnosis not present

## 2014-12-06 DIAGNOSIS — Z86718 Personal history of other venous thrombosis and embolism: Secondary | ICD-10-CM | POA: Diagnosis not present

## 2014-12-06 HISTORY — DX: Pain in unspecified joint: M25.50

## 2014-12-06 HISTORY — DX: Dizziness and giddiness: R42

## 2014-12-06 HISTORY — DX: Personal history of colon polyps, unspecified: Z86.0100

## 2014-12-06 HISTORY — DX: Family history of other specified conditions: Z84.89

## 2014-12-06 HISTORY — DX: Personal history of other diseases of the digestive system: Z87.19

## 2014-12-06 HISTORY — DX: Personal history of other diseases of the respiratory system: Z87.09

## 2014-12-06 HISTORY — DX: Polyneuropathy, unspecified: G62.9

## 2014-12-06 HISTORY — DX: Personal history of colonic polyps: Z86.010

## 2014-12-06 HISTORY — DX: Other muscle spasm: M62.838

## 2014-12-06 LAB — CBC
HCT: 32.9 % — ABNORMAL LOW (ref 36.0–46.0)
Hemoglobin: 10.9 g/dL — ABNORMAL LOW (ref 12.0–15.0)
MCH: 30.5 pg (ref 26.0–34.0)
MCHC: 33.1 g/dL (ref 30.0–36.0)
MCV: 92.2 fL (ref 78.0–100.0)
Platelets: 203 10*3/uL (ref 150–400)
RBC: 3.57 MIL/uL — ABNORMAL LOW (ref 3.87–5.11)
RDW: 13.9 % (ref 11.5–15.5)
WBC: 7.3 10*3/uL (ref 4.0–10.5)

## 2014-12-06 LAB — BASIC METABOLIC PANEL
Anion gap: 7 (ref 5–15)
BUN: 18 mg/dL (ref 6–20)
CO2: 28 mmol/L (ref 22–32)
Calcium: 10.3 mg/dL (ref 8.9–10.3)
Chloride: 104 mmol/L (ref 101–111)
Creatinine, Ser: 1.41 mg/dL — ABNORMAL HIGH (ref 0.44–1.00)
GFR calc Af Amer: 44 mL/min — ABNORMAL LOW (ref >60)
GFR calc non Af Amer: 38 mL/min — ABNORMAL LOW (ref >60)
Glucose, Bld: 108 mg/dL — ABNORMAL HIGH (ref 65–99)
Potassium: 4.1 mmol/L (ref 3.5–5.1)
Sodium: 139 mmol/L (ref 135–145)

## 2014-12-06 NOTE — Progress Notes (Addendum)
Cardiologist saw one >63yrs ago  Medical Md is Dr.Kimberlee Brigitte Pulse  Echo >42yrs ago  Stress test denies ever having one  Heart cath denies having one  EKG denies having one in past yr   CXR denies having one in the past yr  Sleep study in epic from 2012

## 2014-12-06 NOTE — Pre-Procedure Instructions (Signed)
Connie Bennett  12/06/2014      [facility] [id number] Connie Bennett, [address] [place] [address] Phone: [phone] Fax: [phone]    Your procedure is scheduled on Tues, Sept 13 @ 11:30 AM  Report to [facility] Admitting at 9:30 AM  Call this number if you have problems the morning of surgery:  [phone]   Remember:  Do not eat food or drink liquids after midnight.  Take these medicines the morning of surgery with A SIP OF WATER Alprazolam(Xanax),Amlodipine(Norvasc),Gabapentin(Neurontin),Pain Pill(if needed),and Effexor(Venlafaxine)               Stop taking your Aspirin and Flax Seed. No Goody's,BC's,Aleve,Ibuprofen,Fish Oil,or any Herbal Medications.    Do not wear jewelry, make-up or nail polish.  Do not wear lotions, powders, or perfumes.  You may wear deodorant.  Do not shave 48 hours prior to surgery.    Do not bring valuables to the hospital.  [facility] is not responsible for any belongings or valuables.  Contacts, dentures or bridgework may not be worn into surgery.  Leave your suitcase in the car.  After surgery it may be brought to your room.  For patients admitted to the hospital, discharge time will be determined by your treatment team.  Patients discharged the day of surgery will not be allowed to drive home.    Special instructions:  Connie Bennett - Preparing for Surgery  Before surgery, you can play an important role.  Because skin is not sterile, your skin needs to be as free of germs as possible.  You can reduce the number of germs on you skin by washing with CHG (chlorahexidine gluconate) soap before surgery.  CHG is an antiseptic cleaner which kills germs and bonds with the skin to continue killing germs even after washing.  Please DO NOT use if you have an allergy to CHG or antibacterial soaps.  If your skin becomes reddened/irritated stop using the CHG and inform your nurse when you arrive at Short  Stay.  Do not shave (including legs and underarms) for at least 48 hours prior to the first CHG shower.  You may shave your face.  Please follow these instructions carefully:   1.  Shower with CHG Soap the night before surgery and the                                morning of Surgery.  2.  If you choose to wash your hair, wash your hair first as usual with your       normal shampoo.  3.  After you shampoo, rinse your hair and body thoroughly to remove the                      Shampoo.  4.  Use CHG as you would any other liquid soap.  You can apply chg directly       to the skin and wash gently with scrungie or a clean washcloth.  5.  Apply the CHG Soap to your body ONLY FROM THE NECK DOWN.        Do not use on open wounds or open sores.  Avoid contact with your eyes,       ears, mouth and genitals (private parts).  Wash genitals (private parts)       with your normal soap.  6.  Wash thoroughly, paying special attention to the area where your surgery        will be performed.  7.  Thoroughly rinse your body with warm water from the neck down.  8.  DO NOT shower/wash with your normal soap after using and rinsing off       the CHG Soap.  9.  Pat yourself dry with a clean towel.            10.  Wear clean pajamas.            11.  Place clean sheets on your bed the night of your first shower and do not        sleep with pets.  Day of Surgery  Do not apply any lotions/deoderants the morning of surgery.  Please wear clean clothes to the hospital/surgery center.    Please read over the following fact sheets that you were given. Pain Booklet, Coughing and Deep Breathing and Surgical Site Infection Prevention

## 2014-12-07 NOTE — Progress Notes (Addendum)
Anesthesia Chart Review:  Pt is 63 year old female scheduled for R nipple sparing mastectomy, R breast reconstruction with tissue expander or implant and possible acellular dermal matrix on 12/12/2014 with Dr. Barry Dienes and Dr. Harlow Mares.   PMH includes: HTN, MVP, peripheral edema, Sjogren's disease, DVT (2008), hyperlipidemia, breast cancer. Former smoker. BMI 27. S/p re-excision of breast lumpectomy 09/29/14. S/p partial mastectomy with sentinel lymph node biopsy 09/13/14.   Medications include: amlodipine, ASA, zetia, lasix, potassium, ramipril, crestor.   Preoperative labs reviewed.    EKG 12/06/2014: NSR. RBBB. Inferior infarct, age undetermined. RBBB is new since tracing in 2008.  Reviewed case with Dr. Orene Desanctis. Infarct and RBBB are new. Pt needs cardiac evaluation prior to surgery. Called and spoke with [facility] in Dr. Marlowe Aschoff office to notify her of need for clearance.   Willeen Cass, FNP-BC [facility]/Anesthesiology Phone: [phone] 12/08/2014 2:43 PM

## 2014-12-11 ENCOUNTER — Telehealth: Payer: Self-pay | Admitting: Internal Medicine

## 2014-12-11 NOTE — Progress Notes (Signed)
Spoke with Dr Barry Dienes re: need for cardiac clearance.  She says they "will get right on it".

## 2014-12-11 NOTE — Telephone Encounter (Signed)
Received records from [facility] for upcoming appointment with Dr. Debara Pickett on 01/01/2015.  Records given to [facility].  cbr

## 2014-12-11 NOTE — Telephone Encounter (Signed)
Pt is new,will be seen on 01-01-15. She need to ask some questions please.

## 2014-12-11 NOTE — Telephone Encounter (Signed)
I called patient, she was very upset - found out today that procedure for tomorrow was cancelled for her mastectomy w/ concern for cardiac clearance by anesthesiologist.  Pt baffled by this, she had 2 lumpectomies w/ no issues earlier this summer, concerned for progression of breast CA.  Gave reassurance, informed her I would speak to Dr. Debara Pickett - see what can be done about sooner cardiac clearance since she was set up w/ soonest reg appt on 10/3.  Discussed history, situation w/ Dr. Debara Pickett. Hx MVP, murmur. Seen by Dr. Marlou Porch in 2011, pt is essentially new/reestablished care patient for [place]. Dr. Debara Pickett was understanding of patient situation, & need to address clearance promptly - offered double booking next week on his schedule (no more office days this week) or recommended to offer flex visit w/ PA or NP this week. If seen by extender, can f/u w/ Dr. Debara Pickett at later date.  I returned call to patient. Explained Dr. Lysbeth Penner advise on seeing sooner - patient very appreciative. Advised to anticipate call from [place] this week and requested she call back if she has not heard from [place] - pt voiced understanding.  Will review availability here (B. Hager in clinic 2 days this week) or if no opening, discuss w/ [employer].

## 2014-12-12 ENCOUNTER — Encounter (HOSPITAL_COMMUNITY): Admission: RE | Payer: Self-pay | Source: Ambulatory Visit

## 2014-12-12 ENCOUNTER — Ambulatory Visit (HOSPITAL_COMMUNITY): Admission: RE | Admit: 2014-12-12 | Payer: PPO | Source: Ambulatory Visit | Admitting: General Surgery

## 2014-12-12 SURGERY — NIPPLE SPARING MASTECTOMY WITH SENTINAL LYMPH NODE BIOPSY AND  RECONSTRUCTION WITH PLACEMENT OF TISSUE EXPANDER
Anesthesia: General | Laterality: Right

## 2014-12-12 NOTE — Telephone Encounter (Signed)
Connie Bennett called me and was able to add patient for Connie Bennett's schedule Friday Sept 16th.  I called patient, informed of appt. Verified she understood provider, time, location, date, and our contact number at [facility] office.  Instructed to arrive 15 mins prior, also to ensure updated paperwork faxed to our office. Pt voiced understanding, thanks for the call.

## 2014-12-12 NOTE — Telephone Encounter (Signed)
LM for Connie Bennett (operator) w/ request to help schedule flex.

## 2014-12-15 ENCOUNTER — Encounter: Payer: Self-pay | Admitting: Physician Assistant

## 2014-12-15 ENCOUNTER — Ambulatory Visit (INDEPENDENT_AMBULATORY_CARE_PROVIDER_SITE_OTHER): Payer: PPO | Admitting: Physician Assistant

## 2014-12-15 VITALS — BP 150/60 | HR 69 | Ht 62.5 in | Wt 149.0 lb

## 2014-12-15 DIAGNOSIS — Z01818 Encounter for other preprocedural examination: Secondary | ICD-10-CM | POA: Diagnosis not present

## 2014-12-15 DIAGNOSIS — I059 Rheumatic mitral valve disease, unspecified: Secondary | ICD-10-CM

## 2014-12-15 DIAGNOSIS — I10 Essential (primary) hypertension: Secondary | ICD-10-CM | POA: Insufficient documentation

## 2014-12-15 DIAGNOSIS — I4510 Unspecified right bundle-branch block: Secondary | ICD-10-CM | POA: Diagnosis not present

## 2014-12-15 DIAGNOSIS — I341 Nonrheumatic mitral (valve) prolapse: Secondary | ICD-10-CM

## 2014-12-15 NOTE — Progress Notes (Signed)
Patient ID: Connie Bennett, female   DOB: [birth year], 64 y.o.   MRN: [id number]    Date:  12/15/2014   ID:  Connie Bennett, DOB [birth year], MRN [id number]  PCP:  Mayra Neer, MD  Primary Cardiologist:  [facility]   Chief Complaint  Patient presents with  . Leg Swelling,  preoperative clearance      History of Present Illness: Connie Bennett is a 64 y.o. female history of breast cancer, mitral valve prolapse, hypertension, arthritis, DVT, hyperlipidemia, fibromyalgia, Sjogren's disease, vertigo, peripheral neuropathy, venous insufficiency IBS. Patient underwent breast lumpectomy in July any complications. She subsequently needs a mastectomy and during preop workup they did an EKG which showed a right bundle branch block. There are no previous EKGs in the system. She was followed by Dr. Marlou Porch but has not seen him in at least 4 years.  She is now here for preoperative clearance.  She denies any chest pain, orthopnea, nausea, vomiting or shortness of breath as well as  dizziness, PND, cough, congestion, abdominal pain, hematochezia, melena, lower extremity edema, claudication.  Lipid Panel  No results found for: CHOL, TRIG, HDL, CHOLHDL, VLDL, LDLCALC, LDLDIRECT    Wt Readings from Last 3 Encounters:  12/15/14 149 lb (67.586 kg)  12/06/14 149 lb 3.2 oz (67.677 kg)  10/04/14 152 lb 11.2 oz (69.264 kg)     Past Medical History  Diagnosis Date  . Osteoporosis   . Mitral valve prolapse     MILD /   PER PT ASYMPTOMATIC  . Chronic fatigue syndrome   . Arthritis   . History of DVT of lower extremity     2008--  BILATERAL  . Hyperlipidemia     takes Zetia and Crestor  daily  . History of drug overdose     oxycontin  and oxycodone  Sept 2015--  now has narcan injection prescription  . Breast cancer oncologist-  dr Truitt Merle--  right upper-outer quadrant     dx May 2016--- Stage 0  (Tis,N0,M0)  DCIS  Right breast---  09-13-2014  s/p  radioactive seed/ partial mastectomy / sln bx  .  Fibromyalgia   . RSD (reflex sympathetic dystrophy)     left wrist and forearm from a fx  . Bilateral lower extremity edema     takes Furosemide daily as needed  . Sjogren's disease   . Chronic low back pain   . Anxiety     takes Xanax daily as needed  . Depression     takes Effexor daily  . HTN (hypertension)     takes Amlodipine and Ramipril daily  . Muscle spasm     takes Zanaflex daily as needed  . RSD (reflex sympathetic dystrophy)   . Family history of adverse reaction to anesthesia     mom hard to wake up  . History of bronchitis > 16yrs ago  . Headache(784.0)     couple of times a week  . Vertigo     takes Meclizine daily   . Peripheral neuropathy     takes Neurontin daily  . Joint pain   . History of hiatal hernia   . History of colon polyps     benign    Current Outpatient Prescriptions  Medication Sig Dispense Refill  . ALPRAZolam (XANAX) 0.5 MG tablet Take 0.5-1 mg by mouth 2 (two) times daily as needed for anxiety.     Marland Kitchen amLODipine (NORVASC) 5 MG tablet Take 5 mg by mouth every morning.     Marland Kitchen  ascorbic acid (VITAMIN C) 1000 MG tablet Take 1,000 mg by mouth daily.      Marland Kitchen aspirin 81 MG tablet Take 81 mg by mouth daily.    . calcium carbonate (OS-CAL) 600 MG TABS tablet Take 600 mg by mouth daily with breakfast.    . cyclobenzaprine (FLEXERIL) 10 MG tablet Take 10 mg by mouth 2 (two) times daily as needed for muscle spasms.    Marland Kitchen ezetimibe (ZETIA) 10 MG tablet Take 10 mg by mouth every evening.     . fish oil-omega-3 fatty acids 1000 MG capsule Take 3,000 mg by mouth daily.     . Flaxseed, Linseed, (FLAX SEED OIL) 1000 MG CAPS Take 3,000 mg by mouth daily. 3 capsules once a day    . furosemide (LASIX) 40 MG tablet Take 40 mg by mouth as needed.   1  . gabapentin (NEURONTIN) 600 MG tablet Take 600 mg by mouth 3 (three) times daily.      Marland Kitchen KLOR-CON M20 20 MEQ tablet Take 20 mEq by mouth as needed.  1  . lidocaine (LIDODERM) 5 % Place 1 patch onto the skin daily. PRN    ----Remove & Discard patch within 12 hours or as directed by MD    . meclizine (ANTIVERT) 25 MG tablet Take 25 mg by mouth 3 (three) times daily as needed for dizziness.    . Multiple Vitamins-Minerals (CENTRUM PO) Take 1 tablet by mouth daily. Once a day    . oxyCODONE-acetaminophen (PERCOCET) 10-325 MG per tablet Take 1-2 tablets by mouth every 4 (four) hours as needed for pain. 30 tablet 0  . ramipril (ALTACE) 10 MG tablet Take 10 mg by mouth every morning.     . rosuvastatin (CRESTOR) 20 MG tablet Take 20 mg by mouth every evening.     Marland Kitchen tiZANidine (ZANAFLEX) 4 MG tablet Take 4-8 mg by mouth at bedtime as needed for muscle spasms. May take twice a day if needed    . venlafaxine (EFFEXOR-XR) 150 MG 24 hr capsule Take 150 mg by mouth 2 (two) times daily.      No current facility-administered medications for this visit.    Allergies:    Allergies  Allergen Reactions  . Carbocaine [Mepivacaine Hcl] Hives and Swelling  . Latex Rash  . Penicillins Nausea And Vomiting, Swelling and Rash  . Sulfa Antibiotics Nausea And Vomiting, Swelling, Rash and Other (See Comments)    Headache     Social History:  The patient  reports that she has quit smoking. She has never used smokeless tobacco. She reports that she does not drink alcohol or use illicit drugs.   Family history:   Family History  Problem Relation Age of Onset  . Heart murmur Mother   . Cancer Mother 36    carcinoid tumor   . Heart murmur Sister     x2  . Cancer Maternal Aunt     lung cancer  . Cancer Paternal Aunt     breast cancer   . Cancer Maternal Aunt     gastric cancer   . Cancer Cousin     lung cancer  . Cancer Cousin     bone cancer   . Heart attack Maternal Uncle   . Heart attack Paternal Uncle   . Hypertension Mother   . Hypertension Son   . Stroke Neg Hx     ROS:  Please see the history of present illness.  All other systems reviewed and negative.  PHYSICAL EXAM: VS:  BP 150/60 mmHg  Pulse 69  Ht  5' 2.5" (1.588 m)  Wt 149 lb (67.586 kg)  BMI 26.80 kg/m2 Well nourished, well developed, in no acute distress HEENT: Pupils are equal round react to light accommodation extraocular movements are intact.  Neck: no JVDNo cervical lymphadenopathy. Cardiac: Regular rate and rhythm with 1/6 systolic murmur loudest at the LSB she does have a murmur radiating into the axilla not worse with valsalva  Lungs:  clear to auscultation bilaterally, no wheezing, rhonchi or rales Abd: soft, nontender, positive bowel sounds all quadrants, no hepatosplenomegaly Ext: no lower extremity edema.  2+ radial and  rightdorsalis pedis pulses. 1+ left popliteal  Skin: warm and dry Neuro:  Grossly normal   EKG:  right bundle branch block rate 69 bpm   ASSESSMENT AND PLAN:  Problem List Items Addressed This Visit    Right bundle branch block   Relevant Medications   aspirin 81 MG tablet   Preoperative clearance   Mitral valve prolapse   Relevant Medications   aspirin 81 MG tablet   Essential hypertension   Relevant Medications   aspirin 81 MG tablet    Other Visit Diagnoses    Mitral valve disease    -  Primary    Relevant Medications    aspirin 81 MG tablet    Other Relevant Orders    Echocardiogram    Myocardial Perfusion Imaging    Pre-operative clearance        Relevant Orders    EKG 12-Lead       Preoperative clearance Right bundle branch block Patient will be scheduled for Lexiscan stress test. I suspect she'll be low risk for surgery given she just had an uncomplicated surgery in July. The right bundle branch block itself may be old.  No previous records.    essential hypertension Blood pressure elevated. However, the patient's very nervous about this appointment. No changes to current medications.   Mitral valve prolapse Repeat echocardiogram to monitor prolapse.

## 2014-12-15 NOTE — Patient Instructions (Addendum)
Medication Instructions:  Your physician recommends that you continue on your current medications as directed. Please refer to the Current Medication list given to you today.   Labwork: None ordered  Testing/Procedures: Your physician has requested that you have a lexiscan myoview. For further information please visit [email or web address]. Please follow instruction sheet, as given.  Your physician has requested that you have an echocardiogram.  Echocardiography is a painless test that uses sound waves to create images of your heart. It provides your doctor with information about the size and shape of your heart and how well your heart's chambers and valves are working. This procedure takes approximately one hour. There are no restrictions for this procedure.    Follow-Up: Your physician recommends that you schedule a follow-up appointment in: [place] DR. Marlou Porch.    Any Other Special Instructions Will Be Listed Below (If Applicable). Pharmacologic Stress Electrocardiogram A pharmacologic stress electrocardiogram is a heart (cardiac) test that uses nuclear imaging to evaluate the blood supply to your heart. This test may also be called a pharmacologic stress electrocardiography. Pharmacologic means that a medicine is used to increase your heart rate and blood pressure.  This stress test is done to find areas of poor blood flow to the heart by determining the extent of coronary artery disease (CAD). Some people exercise on a treadmill, which naturally increases the blood flow to the heart. For those people unable to exercise on a treadmill, a medicine is used. This medicine stimulates your heart and will cause your heart to beat harder and more quickly, as if you were exercising.  Pharmacologic stress tests can help determine:  The adequacy of blood flow to your heart during increased levels of activity in order to clear you for discharge home.  The extent of coronary artery blockage  caused by CAD.  Your prognosis if you have suffered a heart attack.  The effectiveness of cardiac procedures done, such as an angioplasty, which can increase the circulation in your coronary arteries.  Causes of chest pain or pressure. LET [facility] CARE PROVIDER KNOW ABOUT:  Any allergies you have.  All medicines you are taking, including vitamins, herbs, eye drops, creams, and over-the-counter medicines.  Previous problems you or members of your family have had with the use of anesthetics.  Any blood disorders you have.  Previous surgeries you have had.  Medical conditions you have.  Possibility of pregnancy, if this applies.  If you are currently breastfeeding. RISKS AND COMPLICATIONS Generally, this is a safe procedure. However, as with any procedure, complications can occur. Possible complications include:  You develop pain or pressure in the following areas:  Chest.  Jaw or neck.  Between your shoulder blades.  Radiating down your left arm.  Headache.  Dizziness or light-headedness.  Shortness of breath.  Increased or irregular heartbeat.  Low blood pressure.  Nausea or vomiting.  Flushing.  Redness going up the arm and slight pain during injection of medicine.  Heart attack (rare). BEFORE THE PROCEDURE   Avoid all forms of caffeine for 24 hours before your test or as directed by your health care provider. This includes coffee, tea (even decaffeinated tea), caffeinated sodas, chocolate, cocoa, and certain pain medicines.  Follow your health care provider's instructions regarding eating and drinking before the test.  Take your medicines as directed at regular times with water unless instructed otherwise. Exceptions may include:  If you have diabetes, ask how you are to take your insulin or pills. It is common  to adjust insulin dosing the morning of the test.  If you are taking beta-blocker medicines, it is important to talk to your health care  provider about these medicines well before the date of your test. Taking beta-blocker medicines may interfere with the test. In some cases, these medicines need to be changed or stopped 24 hours or more before the test.  If you wear a nitroglycerin patch, it may need to be removed prior to the test. Ask your health care provider if the patch should be removed before the test.  If you use an inhaler for any breathing condition, bring it with you to the test.  If you are an outpatient, bring a snack so you can eat right after the stress phase of the test.  Do not smoke for 4 hours prior to the test or as directed by your health care provider.  Do not apply lotions, powders, creams, or oils on your chest prior to the test.  Wear comfortable shoes and clothing. Let your health care provider know if you were unable to complete or follow the preparations for your test. PROCEDURE   Multiple patches (electrodes) will be put on your chest. If needed, small areas of your chest may be shaved to get better contact with the electrodes. Once the electrodes are attached to your body, multiple wires will be attached to the electrodes, and your heart rate will be monitored.  An IV access will be started. A nuclear trace (isotope) is given. The isotope may be given intravenously, or it may be swallowed. Nuclear refers to several types of radioactive isotopes, and the nuclear isotope lights up the arteries so that the nuclear images are clear. The isotope is absorbed by your body. This results in low radiation exposure.  A resting nuclear image is taken to show how your heart functions at rest.  A medicine is given through the IV access.  A second scan is done about 1 hour after the medicine injection and determines how your heart functions under stress.  During this stress phase, you will be connected to an electrocardiogram machine. Your blood pressure and oxygen levels will be monitored. AFTER THE  PROCEDURE   Your heart rate and blood pressure will be monitored after the test.  You may return to your normal schedule, including diet,activities, and medicines, unless your health care provider tells you otherwise. Document Released: 08/03/2008 Document Revised: 03/22/2013 Document Reviewed: 11/22/2012 [facility] Patient Information 2015 [place], [place]. This information is not intended to replace advice given to you by your health care provider. Make sure you discuss any questions you have with your health care provider.    Echocardiogram An echocardiogram, or echocardiography, uses sound waves (ultrasound) to produce an image of your heart. The echocardiogram is simple, painless, obtained within a short period of time, and offers valuable information to your health care provider. The images from an echocardiogram can provide information such as:  Evidence of coronary artery disease (CAD).  Heart size.  Heart muscle function.  Heart valve function.  Aneurysm detection.  Evidence of a past heart attack.  Fluid buildup around the heart.  Heart muscle thickening.  Assess heart valve function. LET [facility] CARE PROVIDER KNOW ABOUT:  Any allergies you have.  All medicines you are taking, including vitamins, herbs, eye drops, creams, and over-the-counter medicines.  Previous problems you or members of your family have had with the use of anesthetics.  Any blood disorders you have.  Previous surgeries you have  had.  Medical conditions you have.  Possibility of pregnancy, if this applies. BEFORE THE PROCEDURE  No special preparation is needed. Eat and drink normally.  PROCEDURE   In order to produce an image of your heart, gel will be applied to your chest and a wand-like tool (transducer) will be moved over your chest. The gel will help transmit the sound waves from the transducer. The sound waves will harmlessly bounce off your heart to allow the heart images to be  captured in real-time motion. These images will then be recorded.  You may need an IV to receive a medicine that improves the quality of the pictures. AFTER THE PROCEDURE You may return to your normal schedule including diet, activities, and medicines, unless your health care provider tells you otherwise. Document Released: 03/14/2000 Document Revised: 08/01/2013 Document Reviewed: 11/22/2012 [facility] Patient Information 2015 [place], [place]. This information is not intended to replace advice given to you by your health care provider. Make sure you discuss any questions you have with your health care provider.

## 2014-12-18 ENCOUNTER — Other Ambulatory Visit: Payer: Self-pay

## 2014-12-18 ENCOUNTER — Ambulatory Visit (HOSPITAL_BASED_OUTPATIENT_CLINIC_OR_DEPARTMENT_OTHER): Payer: PPO

## 2014-12-18 ENCOUNTER — Ambulatory Visit (HOSPITAL_COMMUNITY): Payer: PPO | Attending: Cardiology

## 2014-12-18 DIAGNOSIS — I071 Rheumatic tricuspid insufficiency: Secondary | ICD-10-CM | POA: Insufficient documentation

## 2014-12-18 DIAGNOSIS — I517 Cardiomegaly: Secondary | ICD-10-CM | POA: Insufficient documentation

## 2014-12-18 DIAGNOSIS — R55 Syncope and collapse: Secondary | ICD-10-CM | POA: Insufficient documentation

## 2014-12-18 DIAGNOSIS — Z01818 Encounter for other preprocedural examination: Secondary | ICD-10-CM | POA: Insufficient documentation

## 2014-12-18 DIAGNOSIS — I10 Essential (primary) hypertension: Secondary | ICD-10-CM | POA: Insufficient documentation

## 2014-12-18 DIAGNOSIS — E785 Hyperlipidemia, unspecified: Secondary | ICD-10-CM | POA: Diagnosis not present

## 2014-12-18 DIAGNOSIS — I371 Nonrheumatic pulmonary valve insufficiency: Secondary | ICD-10-CM | POA: Insufficient documentation

## 2014-12-18 DIAGNOSIS — I059 Rheumatic mitral valve disease, unspecified: Secondary | ICD-10-CM | POA: Diagnosis not present

## 2014-12-18 DIAGNOSIS — I340 Nonrheumatic mitral (valve) insufficiency: Secondary | ICD-10-CM | POA: Diagnosis not present

## 2014-12-18 LAB — MYOCARDIAL PERFUSION IMAGING
LV dias vol: 96 mL
LV sys vol: 30 mL
Peak HR: 83 {beats}/min
RATE: 0.3
Rest HR: 63 {beats}/min
SDS: 1
SRS: 0
SSS: 1
TID: 1.07

## 2014-12-18 MED ORDER — REGADENOSON 0.4 MG/5ML IV SOLN
0.4000 mg | Freq: Once | INTRAVENOUS | Status: AC
Start: 2014-12-18 — End: 2014-12-18
  Administered 2014-12-18: 0.4 mg via INTRAVENOUS

## 2014-12-18 MED ORDER — TECHNETIUM TC 99M SESTAMIBI GENERIC - CARDIOLITE
31.7000 | Freq: Once | INTRAVENOUS | Status: AC | PRN
  Administered 2014-12-18: 31.7 via INTRAVENOUS

## 2014-12-18 MED ORDER — TECHNETIUM TC 99M SESTAMIBI GENERIC - CARDIOLITE
10.6000 | Freq: Once | INTRAVENOUS | Status: AC | PRN
  Administered 2014-12-18: 11 via INTRAVENOUS

## 2014-12-19 ENCOUNTER — Telehealth: Payer: Self-pay | Admitting: *Deleted

## 2014-12-19 NOTE — Telephone Encounter (Signed)
LMPTCB re: Echo results.Connie Bennett 12-19-14

## 2014-12-20 ENCOUNTER — Encounter: Payer: Self-pay | Admitting: *Deleted

## 2014-12-20 NOTE — Telephone Encounter (Signed)
Spoke with pt this morning regarding her Echo results, per Tarri Fuller, PA-C, and advised her that her ECHO was good.  She was asking re: surgical clearance and I told her I would look into it for her.  Pt verbalized appreciation and understanding.

## 2015-01-01 ENCOUNTER — Ambulatory Visit: Payer: PPO | Admitting: Internal Medicine

## 2015-01-17 ENCOUNTER — Other Ambulatory Visit (HOSPITAL_COMMUNITY): Payer: Self-pay | Admitting: Plastic Surgery

## 2015-01-19 ENCOUNTER — Other Ambulatory Visit (HOSPITAL_COMMUNITY): Payer: Self-pay | Admitting: Plastic Surgery

## 2015-01-22 ENCOUNTER — Encounter (HOSPITAL_COMMUNITY): Payer: Self-pay

## 2015-01-22 ENCOUNTER — Other Ambulatory Visit (HOSPITAL_COMMUNITY): Payer: Self-pay | Admitting: Plastic Surgery

## 2015-01-22 ENCOUNTER — Other Ambulatory Visit: Payer: Self-pay | Admitting: General Surgery

## 2015-01-22 ENCOUNTER — Encounter (HOSPITAL_COMMUNITY)
Admission: RE | Admit: 2015-01-22 | Discharge: 2015-01-22 | Disposition: A | Discharge status: Home / Self Care | Payer: PPO | Source: Ambulatory Visit | Attending: General Surgery | Admitting: General Surgery

## 2015-01-22 HISTORY — DX: Cardiac murmur, unspecified: R01.1

## 2015-01-22 HISTORY — DX: Other complications of anesthesia, initial encounter: T88.59XA

## 2015-01-22 HISTORY — DX: Adverse effect of unspecified anesthetic, initial encounter: T41.45XA

## 2015-01-22 HISTORY — DX: Peripheral vascular disease, unspecified: I73.9

## 2015-01-22 LAB — CBC
HCT: 34 % — ABNORMAL LOW (ref 36.0–46.0)
Hemoglobin: 10.9 g/dL — ABNORMAL LOW (ref 12.0–15.0)
MCH: 29.5 pg (ref 26.0–34.0)
MCHC: 32.1 g/dL (ref 30.0–36.0)
MCV: 92.1 fL (ref 78.0–100.0)
Platelets: 278 10*3/uL (ref 150–400)
RBC: 3.69 MIL/uL — ABNORMAL LOW (ref 3.87–5.11)
RDW: 13.9 % (ref 11.5–15.5)
WBC: 6.3 10*3/uL (ref 4.0–10.5)

## 2015-01-22 LAB — COMPREHENSIVE METABOLIC PANEL
ALT: 16 U/L (ref 14–54)
AST: 25 U/L (ref 15–41)
Albumin: 4 g/dL (ref 3.5–5.0)
Alkaline Phosphatase: 65 U/L (ref 38–126)
Anion gap: 8 (ref 5–15)
BUN: 14 mg/dL (ref 6–20)
CO2: 28 mmol/L (ref 22–32)
Calcium: 10.1 mg/dL (ref 8.9–10.3)
Chloride: 105 mmol/L (ref 101–111)
Creatinine, Ser: 1.13 mg/dL — ABNORMAL HIGH (ref 0.44–1.00)
GFR calc Af Amer: 58 mL/min — ABNORMAL LOW (ref >60)
GFR calc non Af Amer: 50 mL/min — ABNORMAL LOW (ref >60)
Glucose, Bld: 107 mg/dL — ABNORMAL HIGH (ref 65–99)
Potassium: 4 mmol/L (ref 3.5–5.1)
Sodium: 141 mmol/L (ref 135–145)
Total Bilirubin: 0.2 mg/dL — ABNORMAL LOW (ref 0.3–1.2)
Total Protein: 6.9 g/dL (ref 6.5–8.1)

## 2015-01-22 NOTE — Pre-Procedure Instructions (Addendum)
Connie Bennett  01/22/2015      [id number] [name], [address] [place] [address] Phone: [phone] Fax: [phone]  [id number] [place], [address] [place] [address] Phone: [phone] Fax: [phone]    Your procedure is scheduled on .01/25/15  Report to [facility] short stay admitting at 1015 A.M.  Call this number if you have problems the morning of surgery:  [phone]   Remember:  Do not eat food or drink liquids after midnight.  Take these medicines the morning of surgery with A SIP OF WATER xanax,amlodopine,gabapentin, oxycodone if needed, effexor  STOP all herbel meds, nsaids (aleve,naproxen,advil,ibuprofen) starting TODAY including vit C,aspirin,calcium,fish oil,flaxseed, multi   Do not wear jewelry, make-up or nail polish.  Do not wear lotions, powders, or perfumes.  You may wear deodorant.  Do not shave 48 hours prior to surgery.  Men may shave face and neck.  Do not bring valuables to the hospital.  [facility] is not responsible for any belongings or valuables.  Contacts, dentures or bridgework may not be worn into surgery.  Leave your suitcase in the car.  After surgery it may be brought to your room.  For patients admitted to the hospital, discharge time will be determined by your treatment team.  Patients discharged the day of surgery will not be allowed to drive home.   Name and phone number of your driver:    Special instructions:   Special Instructions: [facility] - Preparing for Surgery  Before surgery, you can play an important role.  Because skin is not sterile, your skin needs to be as free of germs as possible.  You can reduce the number of germs on you skin by washing with CHG (chlorahexidine gluconate) soap before surgery.  CHG is an antiseptic cleaner which kills germs and bonds with the skin to continue killing germs even after  washing.  Please DO NOT use if you have an allergy to CHG or antibacterial soaps.  If your skin becomes reddened/irritated stop using the CHG and inform your nurse when you arrive at Short Stay.  Do not shave (including legs and underarms) for at least 48 hours prior to the first CHG shower.  You may shave your face.  Please follow these instructions carefully:   1.  Shower with CHG Soap the night before surgery and the morning of Surgery.  2.  If you choose to wash your hair, wash your hair first as usual with your normal shampoo.  3.  After you shampoo, rinse your hair and body thoroughly to remove the Shampoo.  4.  Use CHG as you would any other liquid soap.  You can apply chg directly  to the skin and wash gently with scrungie or a clean washcloth.  5.  Apply the CHG Soap to your body ONLY FROM THE NECK DOWN.  Do not use on open wounds or open sores.  Avoid contact with your eyes ears, mouth and genitals (private parts).  Wash genitals (private parts)       with your normal soap.  6.  Wash thoroughly, paying special attention to the area where your surgery will be performed.  7.  Thoroughly rinse your body with warm water from the neck down.  8.  DO NOT shower/wash with your normal soap after using and rinsing off the CHG Soap.  9.  Pat yourself dry with a clean  towel.            10.  Wear clean pajamas.            11.  Place clean sheets on your bed the night of your first shower and do not sleep with pets.  Day of Surgery  Do not apply any lotions/deodorants the morning of surgery.  Please wear clean clothes to the hospital/surgery center.  Please read over the following fact sheets that you were given. Pain Booklet, Coughing and Deep Breathing and Surgical Site Infection Prevention

## 2015-01-23 ENCOUNTER — Other Ambulatory Visit: Payer: Self-pay | Admitting: General Surgery

## 2015-01-24 MED ORDER — CIPROFLOXACIN IN D5W 400 MG/200ML IV SOLN
400.0000 mg | INTRAVENOUS | Status: AC
  Administered 2015-01-25: 400 mg via INTRAVENOUS
  Filled 2015-01-24: qty 200

## 2015-01-24 MED ORDER — VANCOMYCIN HCL IN DEXTROSE 1-5 GM/200ML-% IV SOLN
1000.0000 mg | INTRAVENOUS | Status: AC
  Administered 2015-01-25: 1000 mg via INTRAVENOUS
  Filled 2015-01-24: qty 200

## 2015-01-24 MED ORDER — HEPARIN SODIUM (PORCINE) 5000 UNIT/ML IJ SOLN
5000.0000 [IU] | INTRAMUSCULAR | Status: AC
  Administered 2015-01-25: 5000 [IU] via SUBCUTANEOUS
  Filled 2015-01-24: qty 1

## 2015-01-25 ENCOUNTER — Inpatient Hospital Stay (HOSPITAL_COMMUNITY): Payer: PPO | Admitting: Certified Registered Nurse Anesthetist

## 2015-01-25 ENCOUNTER — Inpatient Hospital Stay (HOSPITAL_COMMUNITY)
Admission: RE | Admit: 2015-01-25 | Discharge: 2015-01-26 | DRG: 583 | Disposition: A | Discharge status: Home / Self Care | Payer: PPO | Source: Ambulatory Visit | Attending: Plastic Surgery | Admitting: Plastic Surgery

## 2015-01-25 ENCOUNTER — Encounter (HOSPITAL_COMMUNITY)
Admission: RE | Disposition: A | Discharge status: Home / Self Care | Payer: Self-pay | Source: Ambulatory Visit | Attending: Plastic Surgery

## 2015-01-25 ENCOUNTER — Encounter (HOSPITAL_COMMUNITY): Payer: Self-pay | Admitting: Certified Registered"

## 2015-01-25 DIAGNOSIS — C50411 Malignant neoplasm of upper-outer quadrant of right female breast: Principal | ICD-10-CM | POA: Diagnosis present

## 2015-01-25 DIAGNOSIS — Z91040 Latex allergy status: Secondary | ICD-10-CM | POA: Diagnosis not present

## 2015-01-25 DIAGNOSIS — Z800 Family history of malignant neoplasm of digestive organs: Secondary | ICD-10-CM

## 2015-01-25 DIAGNOSIS — I10 Essential (primary) hypertension: Secondary | ICD-10-CM | POA: Diagnosis present

## 2015-01-25 DIAGNOSIS — Z171 Estrogen receptor negative status [ER-]: Secondary | ICD-10-CM | POA: Diagnosis not present

## 2015-01-25 DIAGNOSIS — Z981 Arthrodesis status: Secondary | ICD-10-CM | POA: Diagnosis not present

## 2015-01-25 DIAGNOSIS — Z882 Allergy status to sulfonamides status: Secondary | ICD-10-CM

## 2015-01-25 DIAGNOSIS — Z9010 Acquired absence of unspecified breast and nipple: Secondary | ICD-10-CM

## 2015-01-25 DIAGNOSIS — Z880 Allergy status to penicillin: Secondary | ICD-10-CM

## 2015-01-25 DIAGNOSIS — Z886 Allergy status to analgesic agent status: Secondary | ICD-10-CM | POA: Diagnosis not present

## 2015-01-25 DIAGNOSIS — Z87891 Personal history of nicotine dependence: Secondary | ICD-10-CM

## 2015-01-25 DIAGNOSIS — Z419 Encounter for procedure for purposes other than remedying health state, unspecified: Secondary | ICD-10-CM

## 2015-01-25 HISTORY — PX: MASTECTOMY: SHX3

## 2015-01-25 HISTORY — PX: SIMPLE MASTECTOMY WITH AXILLARY SENTINEL NODE BIOPSY: SHX6098

## 2015-01-25 HISTORY — PX: BREAST RECONSTRUCTION WITH PLACEMENT OF TISSUE EXPANDER AND FLEX HD (ACELLULAR HYDRATED DERMIS): SHX6295

## 2015-01-25 SURGERY — SIMPLE MASTECTOMY
Anesthesia: General | Laterality: Right

## 2015-01-25 MED ORDER — KETAMINE HCL 100 MG/ML IJ SOLN
INTRAMUSCULAR | Status: AC
  Filled 2015-01-25: qty 1

## 2015-01-25 MED ORDER — BUPIVACAINE-EPINEPHRINE (PF) 0.5% -1:200000 IJ SOLN
INTRAMUSCULAR | Status: DC | PRN
  Administered 2015-01-25: 18 mL via PERINEURAL

## 2015-01-25 MED ORDER — EZETIMIBE 10 MG PO TABS
10.0000 mg | ORAL_TABLET | Freq: Every day | ORAL | Status: DC
  Administered 2015-01-25: 10 mg via ORAL
  Filled 2015-01-25 (×2): qty 1

## 2015-01-25 MED ORDER — 0.9 % SODIUM CHLORIDE (POUR BTL) OPTIME
TOPICAL | Status: DC | PRN
  Administered 2015-01-25: 2000 mL

## 2015-01-25 MED ORDER — FENTANYL CITRATE (PF) 250 MCG/5ML IJ SOLN
INTRAMUSCULAR | Status: AC
  Filled 2015-01-25: qty 5

## 2015-01-25 MED ORDER — CALCIUM CARBONATE 600 MG PO TABS: 600.0000 mg | ORAL_TABLET | Freq: Every day | ORAL | Status: DC

## 2015-01-25 MED ORDER — ONDANSETRON HCL 4 MG/2ML IJ SOLN
INTRAMUSCULAR | Status: AC
  Filled 2015-01-25: qty 2

## 2015-01-25 MED ORDER — HYDROMORPHONE HCL 1 MG/ML IJ SOLN
INTRAMUSCULAR | Status: AC
  Filled 2015-01-25: qty 2

## 2015-01-25 MED ORDER — LACTATED RINGERS IV SOLN
INTRAVENOUS | Status: DC | PRN
  Administered 2015-01-25: 12:00:00 via INTRAVENOUS

## 2015-01-25 MED ORDER — PROPOFOL 10 MG/ML IV BOLUS
INTRAVENOUS | Status: DC | PRN
  Administered 2015-01-25: 120 mg via INTRAVENOUS

## 2015-01-25 MED ORDER — HEPARIN SODIUM (PORCINE) 5000 UNIT/ML IJ SOLN
5000.0000 [IU] | Freq: Three times a day (TID) | INTRAMUSCULAR | Status: DC
  Administered 2015-01-26: 5000 [IU] via SUBCUTANEOUS
  Filled 2015-01-25: qty 1

## 2015-01-25 MED ORDER — EPHEDRINE SULFATE 50 MG/ML IJ SOLN
INTRAMUSCULAR | Status: DC | PRN
  Administered 2015-01-25 (×3): 10 mg via INTRAVENOUS

## 2015-01-25 MED ORDER — TIZANIDINE HCL 2 MG PO TABS: 4.0000 mg | ORAL_TABLET | Freq: Every evening | ORAL | Status: DC | PRN

## 2015-01-25 MED ORDER — ACETAMINOPHEN 325 MG PO TABS: 325.0000 mg | ORAL_TABLET | ORAL | Status: DC | PRN

## 2015-01-25 MED ORDER — MIDAZOLAM HCL 2 MG/2ML IJ SOLN
INTRAMUSCULAR | Status: AC
  Administered 2015-01-25: 2 mg via INTRAVENOUS
  Filled 2015-01-25: qty 2

## 2015-01-25 MED ORDER — REMIFENTANIL HCL 1 MG IV SOLR
INTRAVENOUS | Status: DC | PRN
  Administered 2015-01-25: 50 ug via INTRAVENOUS
  Administered 2015-01-25 (×4): 100 ug via INTRAVENOUS
  Administered 2015-01-25: 50 ug via INTRAVENOUS

## 2015-01-25 MED ORDER — MIDAZOLAM HCL 2 MG/2ML IJ SOLN
INTRAMUSCULAR | Status: AC
  Filled 2015-01-25: qty 4

## 2015-01-25 MED ORDER — CYCLOBENZAPRINE HCL 10 MG PO TABS
ORAL_TABLET | ORAL | Status: AC
  Filled 2015-01-25: qty 1

## 2015-01-25 MED ORDER — FENTANYL CITRATE (PF) 100 MCG/2ML IJ SOLN
INTRAMUSCULAR | Status: AC
  Filled 2015-01-25: qty 2

## 2015-01-25 MED ORDER — SODIUM CHLORIDE 0.9 % IR SOLN
Status: DC | PRN
  Administered 2015-01-25: 500 mL

## 2015-01-25 MED ORDER — FUROSEMIDE 40 MG PO TABS: 40.0000 mg | ORAL_TABLET | Freq: Every day | ORAL | Status: DC | PRN

## 2015-01-25 MED ORDER — DEXAMETHASONE SODIUM PHOSPHATE 10 MG/ML IJ SOLN
INTRAMUSCULAR | Status: AC
  Filled 2015-01-25: qty 1

## 2015-01-25 MED ORDER — AMLODIPINE BESYLATE 5 MG PO TABS
5.0000 mg | ORAL_TABLET | Freq: Every day | ORAL | Status: DC
  Administered 2015-01-26: 5 mg via ORAL
  Filled 2015-01-25: qty 1

## 2015-01-25 MED ORDER — DEXTROSE-NACL 5-0.45 % IV SOLN
INTRAVENOUS | Status: DC
  Administered 2015-01-26: 01:00:00 via INTRAVENOUS

## 2015-01-25 MED ORDER — LIDOCAINE HCL (CARDIAC) 20 MG/ML IV SOLN
INTRAVENOUS | Status: DC | PRN
  Administered 2015-01-25: 60 mg via INTRAVENOUS

## 2015-01-25 MED ORDER — REMIFENTANIL HCL 1 MG IV SOLR
INTRAVENOUS | Status: DC | PRN
  Administered 2015-01-25: .2 ug/kg/min via INTRAVENOUS

## 2015-01-25 MED ORDER — ACETAMINOPHEN 160 MG/5ML PO SOLN: 325.0000 mg | ORAL | Status: DC | PRN

## 2015-01-25 MED ORDER — HYDROMORPHONE HCL 1 MG/ML IJ SOLN
0.2500 mg | INTRAMUSCULAR | Status: DC | PRN
  Administered 2015-01-25 (×4): 0.5 mg via INTRAVENOUS

## 2015-01-25 MED ORDER — FENTANYL CITRATE (PF) 100 MCG/2ML IJ SOLN
50.0000 ug | Freq: Once | INTRAMUSCULAR | Status: AC
  Administered 2015-01-25: 50 ug via INTRAVENOUS

## 2015-01-25 MED ORDER — OXYCODONE HCL 5 MG/5ML PO SOLN
ORAL | Status: AC
  Filled 2015-01-25: qty 5

## 2015-01-25 MED ORDER — CALCIUM CARBONATE 1250 (500 CA) MG PO TABS
1.0000 | ORAL_TABLET | Freq: Every day | ORAL | Status: DC
  Administered 2015-01-26: 500 mg via ORAL
  Filled 2015-01-25: qty 1

## 2015-01-25 MED ORDER — KETAMINE HCL 50 MG/ML IJ SOLN
INTRAMUSCULAR | Status: DC | PRN
  Administered 2015-01-25: 30 mg via INTRAMUSCULAR
  Administered 2015-01-25: 20 mg via INTRAMUSCULAR
  Administered 2015-01-25: 30 mg via INTRAMUSCULAR
  Administered 2015-01-25: 20 mg via INTRAMUSCULAR

## 2015-01-25 MED ORDER — POTASSIUM CHLORIDE CRYS ER 20 MEQ PO TBCR: 20.0000 meq | EXTENDED_RELEASE_TABLET | Freq: Every day | ORAL | Status: DC | PRN

## 2015-01-25 MED ORDER — DEXAMETHASONE SODIUM PHOSPHATE 10 MG/ML IJ SOLN
INTRAMUSCULAR | Status: DC | PRN
  Administered 2015-01-25: 10 mg via INTRAVENOUS

## 2015-01-25 MED ORDER — OXYCODONE-ACETAMINOPHEN 10-325 MG PO TABS: 1.0000 | ORAL_TABLET | ORAL | Status: DC | PRN

## 2015-01-25 MED ORDER — OXYCODONE-ACETAMINOPHEN 5-325 MG PO TABS
1.0000 | ORAL_TABLET | ORAL | Status: DC | PRN
  Administered 2015-01-26 (×3): 2 via ORAL
  Filled 2015-01-25 (×3): qty 2

## 2015-01-25 MED ORDER — ALPRAZOLAM 0.5 MG PO TABS: 0.5000 mg | ORAL_TABLET | Freq: Two times a day (BID) | ORAL | Status: DC | PRN

## 2015-01-25 MED ORDER — MIDAZOLAM HCL 2 MG/2ML IJ SOLN
1.0000 mg | Freq: Once | INTRAMUSCULAR | Status: AC
  Administered 2015-01-25: 1 mg via INTRAVENOUS

## 2015-01-25 MED ORDER — CYCLOBENZAPRINE HCL 10 MG PO TABS
10.0000 mg | ORAL_TABLET | Freq: Two times a day (BID) | ORAL | Status: DC | PRN
  Administered 2015-01-25: 10 mg via ORAL

## 2015-01-25 MED ORDER — PROPOFOL 500 MG/50ML IV EMUL
INTRAVENOUS | Status: DC | PRN
  Administered 2015-01-25: 150 ug/kg/min via INTRAVENOUS
  Administered 2015-01-25: 125 ug/kg/min via INTRAVENOUS

## 2015-01-25 MED ORDER — ROCURONIUM BROMIDE 100 MG/10ML IV SOLN
INTRAVENOUS | Status: DC | PRN
  Administered 2015-01-25: 30 mg via INTRAVENOUS

## 2015-01-25 MED ORDER — OXYCODONE HCL 5 MG PO TABS: 5.0000 mg | ORAL_TABLET | Freq: Once | ORAL | Status: DC | PRN

## 2015-01-25 MED ORDER — SUGAMMADEX SODIUM 200 MG/2ML IV SOLN
INTRAVENOUS | Status: AC
  Filled 2015-01-25: qty 2

## 2015-01-25 MED ORDER — SUGAMMADEX SODIUM 200 MG/2ML IV SOLN
INTRAVENOUS | Status: DC | PRN
Start: 2015-01-25 — End: 2015-01-25
  Administered 2015-01-25: 133 mg via INTRAVENOUS

## 2015-01-25 MED ORDER — ONDANSETRON HCL 4 MG/2ML IJ SOLN
INTRAMUSCULAR | Status: DC | PRN
  Administered 2015-01-25: 8 mg via INTRAVENOUS

## 2015-01-25 MED ORDER — VENLAFAXINE HCL ER 75 MG PO CP24
150.0000 mg | ORAL_CAPSULE | Freq: Two times a day (BID) | ORAL | Status: DC
  Administered 2015-01-26: 150 mg via ORAL
  Filled 2015-01-25: qty 2

## 2015-01-25 MED ORDER — GABAPENTIN 600 MG PO TABS
600.0000 mg | ORAL_TABLET | Freq: Three times a day (TID) | ORAL | Status: DC
  Administered 2015-01-25 – 2015-01-26 (×2): 600 mg via ORAL
  Filled 2015-01-25 (×2): qty 1

## 2015-01-25 MED ORDER — OXYCODONE HCL 5 MG/5ML PO SOLN
5.0000 mg | Freq: Once | ORAL | Status: DC | PRN
  Administered 2015-01-25: 5 mg via ORAL

## 2015-01-25 MED ORDER — OXYCODONE HCL 5 MG PO TABS
5.0000 mg | ORAL_TABLET | ORAL | Status: DC | PRN
  Administered 2015-01-26 (×3): 5 mg via ORAL
  Filled 2015-01-25 (×3): qty 1

## 2015-01-25 MED ORDER — VANCOMYCIN HCL 10 G IV SOLR
1250.0000 mg | INTRAVENOUS | Status: DC
  Administered 2015-01-26: 1250 mg via INTRAVENOUS
  Filled 2015-01-25: qty 1250

## 2015-01-25 MED ORDER — SUCCINYLCHOLINE CHLORIDE 20 MG/ML IJ SOLN
INTRAMUSCULAR | Status: DC | PRN
  Administered 2015-01-25: 70 mg via INTRAVENOUS

## 2015-01-25 MED ORDER — RAMIPRIL 10 MG PO CAPS
10.0000 mg | ORAL_CAPSULE | Freq: Every day | ORAL | Status: DC
  Administered 2015-01-26: 10 mg via ORAL
  Filled 2015-01-25: qty 1

## 2015-01-25 MED ORDER — PHENYLEPHRINE HCL 10 MG/ML IJ SOLN
10.0000 mg | INTRAVENOUS | Status: DC | PRN
  Administered 2015-01-25: 50 ug/min via INTRAVENOUS

## 2015-01-25 MED ORDER — MECLIZINE HCL 25 MG PO TABS
25.0000 mg | ORAL_TABLET | Freq: Three times a day (TID) | ORAL | Status: DC | PRN
  Filled 2015-01-25: qty 1

## 2015-01-25 MED ORDER — SUGAMMADEX SODIUM 500 MG/5ML IV SOLN
INTRAVENOUS | Status: AC
  Filled 2015-01-25: qty 5

## 2015-01-25 MED ORDER — PROMETHAZINE HCL 25 MG/ML IJ SOLN: 6.2500 mg | INTRAMUSCULAR | Status: DC | PRN

## 2015-01-25 MED ORDER — SODIUM CHLORIDE 0.9 % IJ SOLN
INTRAMUSCULAR | Status: DC | PRN
  Administered 2015-01-25: 60 mL

## 2015-01-25 MED ORDER — 0.9 % SODIUM CHLORIDE (POUR BTL) OPTIME
TOPICAL | Status: DC | PRN
  Administered 2015-01-25: 1000 mL

## 2015-01-25 SURGICAL SUPPLY — 85 items
ADH SKN CLS APL DERMABOND .7 (GAUZE/BANDAGES/DRESSINGS) ×1
APPLIER CLIP 9.375 MED OPEN (MISCELLANEOUS)
APR CLP MED 9.3 20 MLT OPN (MISCELLANEOUS)
ATCH SMKEVC FLXB CAUT HNDSWH (FILTER) ×1 IMPLANT
BAG DECANTER FOR FLEXI CONT (MISCELLANEOUS) ×2 IMPLANT
BINDER BREAST LRG (GAUZE/BANDAGES/DRESSINGS) ×1 IMPLANT
BINDER BREAST XLRG (GAUZE/BANDAGES/DRESSINGS) IMPLANT
BIOPATCH RED 1 DISK 7.0 (GAUZE/BANDAGES/DRESSINGS) ×4 IMPLANT
BLADE 10 SAFETY STRL DISP (BLADE) ×2 IMPLANT
BLADE KNIFE PERSONA 15 (BLADE) ×1 IMPLANT
CANISTER SUCTION 2500CC (MISCELLANEOUS) ×6 IMPLANT
CHLORAPREP W/TINT 26ML (MISCELLANEOUS) ×4 IMPLANT
CLIP APPLIE 9.375 MED OPEN (MISCELLANEOUS) IMPLANT
CLSR STERI-STRIP ANTIMIC 1/2X4 (GAUZE/BANDAGES/DRESSINGS) ×1 IMPLANT
COVER SURGICAL LIGHT HANDLE (MISCELLANEOUS) ×5 IMPLANT
DERMABOND ADVANCED (GAUZE/BANDAGES/DRESSINGS) ×1
DERMABOND ADVANCED .7 DNX12 (GAUZE/BANDAGES/DRESSINGS) ×1 IMPLANT
DEVICE DISSECT PLASMABLAD 3.0S (MISCELLANEOUS) ×1 IMPLANT
DRAIN CHANNEL 19F RND (DRAIN) ×4 IMPLANT
DRAPE ORTHO SPLIT 77X108 STRL (DRAPES) ×4
DRAPE PROXIMA HALF (DRAPES) ×6 IMPLANT
DRAPE SURG 17X23 STRL (DRAPES) ×4 IMPLANT
DRAPE SURG ORHT 6 SPLT 77X108 (DRAPES) ×2 IMPLANT
DRAPE UTILITY XL STRL (DRAPES) ×4 IMPLANT
DRAPE WARM FLUID 44X44 (DRAPE) ×2 IMPLANT
DRSG PAD ABDOMINAL 8X10 ST (GAUZE/BANDAGES/DRESSINGS) ×4 IMPLANT
DRSG SORBAVIEW 3.5X5-5/16 MED (GAUZE/BANDAGES/DRESSINGS) ×4 IMPLANT
DRSG TELFA 3X8 NADH (GAUZE/BANDAGES/DRESSINGS) ×2 IMPLANT
ELECT BLADE 6.5 EXT (BLADE) IMPLANT
ELECT CAUTERY BLADE 6.4 (BLADE) ×5 IMPLANT
ELECT REM PT RETURN 9FT ADLT (ELECTROSURGICAL) ×6
ELECTRODE REM PT RTRN 9FT ADLT (ELECTROSURGICAL) ×3 IMPLANT
EVACUATOR SILICONE 100CC (DRAIN) ×4 IMPLANT
EVACUATOR SMOKE ACCUVAC VALLEY (FILTER) ×1
GAUZE SPONGE 4X4 12PLY STRL (GAUZE/BANDAGES/DRESSINGS) ×2 IMPLANT
GLOVE BIO SURGEON STRL SZ 6 (GLOVE) ×2 IMPLANT
GLOVE BIO SURGEON STRL SZ7.5 (GLOVE) ×2 IMPLANT
GLOVE BIOGEL PI IND STRL 6.5 (GLOVE) ×1 IMPLANT
GLOVE BIOGEL PI IND STRL 7.5 (GLOVE) IMPLANT
GLOVE BIOGEL PI IND STRL 8 (GLOVE) ×1 IMPLANT
GLOVE BIOGEL PI INDICATOR 6.5 (GLOVE) ×3
GLOVE BIOGEL PI INDICATOR 7.5 (GLOVE) ×2
GLOVE BIOGEL PI INDICATOR 8 (GLOVE) ×1
GLOVE SURG SS PI 6.0 STRL IVOR (GLOVE) ×1 IMPLANT
GLOVE SURG SS PI 6.5 STRL IVOR (GLOVE) ×1 IMPLANT
GOWN STRL REUS W/ TWL LRG LVL3 (GOWN DISPOSABLE) ×2 IMPLANT
GOWN STRL REUS W/ TWL XL LVL3 (GOWN DISPOSABLE) ×1 IMPLANT
GOWN STRL REUS W/TWL 2XL LVL3 (GOWN DISPOSABLE) ×2 IMPLANT
GOWN STRL REUS W/TWL LRG LVL3 (GOWN DISPOSABLE) ×4
GOWN STRL REUS W/TWL XL LVL3 (GOWN DISPOSABLE) ×2
GRAFT FLEX HD 8X16 THICK (Tissue Mesh) ×1 IMPLANT
ILLUMINATOR WAVEGUIDE N/F (MISCELLANEOUS) ×1 IMPLANT
IMPL BREAST SALINE HP 380CC (Breast) IMPLANT
IMPLANT BREAST SALINE HP 380CC (Breast) ×2 IMPLANT
KIT BASIN OR (CUSTOM PROCEDURE TRAY) ×4 IMPLANT
KIT ROOM TURNOVER OR (KITS) ×4 IMPLANT
LIQUID BAND (GAUZE/BANDAGES/DRESSINGS) ×2 IMPLANT
MARKER SKIN DUAL TIP RULER LAB (MISCELLANEOUS) ×3 IMPLANT
NDL PERC 18GX7CM (NEEDLE) IMPLANT
NEEDLE PERC 18GX7CM (NEEDLE) ×2 IMPLANT
NS IRRIG 1000ML POUR BTL (IV SOLUTION) ×6 IMPLANT
PACK GENERAL/GYN (CUSTOM PROCEDURE TRAY) ×4 IMPLANT
PAD ARMBOARD 7.5X6 YLW CONV (MISCELLANEOUS) ×4 IMPLANT
PAD DRESSING TELFA 3X8 NADH (GAUZE/BANDAGES/DRESSINGS) IMPLANT
PLASMABLADE 3.0S (MISCELLANEOUS) ×2
PREFILTER EVAC NS 1 1/3-3/8IN (MISCELLANEOUS) ×2 IMPLANT
SET ASEPTIC TRANSFER (MISCELLANEOUS) ×1 IMPLANT
SIZER BREAST SGL 420CC (SIZER) IMPLANT
SIZER BREAST SGL USE 420CC (SIZER) ×2
SPONGE GAUZE 4X4 12PLY STER LF (GAUZE/BANDAGES/DRESSINGS) ×1 IMPLANT
STRIP CLOSURE SKIN 1/2X4 (GAUZE/BANDAGES/DRESSINGS) ×2 IMPLANT
SUT ETHILON 2 0 FS 18 (SUTURE) ×3 IMPLANT
SUT MNCRL AB 3-0 PS2 18 (SUTURE) ×8 IMPLANT
SUT MON AB 4-0 PC3 18 (SUTURE) ×3 IMPLANT
SUT PDS AB 3-0 SH 27 (SUTURE) ×4 IMPLANT
SUT PROLENE 3 0 PS 2 (SUTURE) ×5 IMPLANT
SUT VIC AB 3-0 SH 18 (SUTURE) ×2 IMPLANT
SUT VIC AB 3-0 SH 8-18 (SUTURE) ×2 IMPLANT
SYR 50ML LL SCALE MARK (SYRINGE) ×1 IMPLANT
SYR BULB IRRIGATION 50ML (SYRINGE) ×2 IMPLANT
TOWEL OR 17X24 6PK STRL BLUE (TOWEL DISPOSABLE) ×4 IMPLANT
TOWEL OR 17X26 10 PK STRL BLUE (TOWEL DISPOSABLE) ×5 IMPLANT
TRAY FOLEY CATH 16FR SILVER (SET/KITS/TRAYS/PACK) IMPLANT
TUBE CONNECTING 12X1/4 (SUCTIONS) ×4 IMPLANT
TUBING SUCTION BULK 100 FT (MISCELLANEOUS) ×1 IMPLANT

## 2015-01-25 NOTE — H&P (Signed)
Connie Bennett. Siegmann Location: [facility] Patient #: [id number] DOB: [birth year] Married / Language: English / Race: White Female   History of Present Illness Patient words: right breast cancer.  The patient is a 64 year old female who presents with breast cancer. Previous history Connie Bennett is a 64 yo F referred by Dr. Brigitte Pulse for consultation regarding a palpable right breast mass and a new diagnosis of cancer. She also had undergone a fall. She underwent diagnostic mammography and ultrasound of the affected breast. She had a cyst located at 8:30 in the right breast that was aspirated. She had two areas in the upper outer right breast. One was 7 mm, and the other was 5 mm. They were very close together, but appeared to be separate. Clips were 1.5 cm apart on clip mammogram. Both areas were biopsied, and they showed similar high grade DCIS with suspicion for early microinvasion. The DCIS was hormone receptor negative. She has no family history of breast cancer. Her mother had a rare type of liver cancer. She had menarche at age 42. Menopause was in the early 65s. She is a G1P1 with first child at age 69-20. She did use oral contraceptives.  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 She also has had spine fusion and a repair of ruptured disk. She is a previous smoker. ] Pt is s/p right seed localized lumpectomy and SLN bx 09/13/2014 followed by takeback for margins 09/29/2014. Unfortunately, her margins demonstrated extensive additional DCIS which was not apparent on her mammogram/ultrasound. Margins are still positive. At her last visit, I spent an extensive amount of time discussing her available options. Unfortunately, she is not a candidate for MRI because of her spinal cord stimulator. She also went to see plastic surgery in order to help make a decision regarding additional take backs versus mastectomy. She did have one cigarette in the intervening  time. She was honest with Dr. Harlow Mares regarding this. She has decided to go with a mastectomy.   Allergies  Penicillin G Sodium *PENICILLINS* SulfADIAZINE *Sulfonamides Carbocaine *LOCAL ANESTHETICS-Parenteral* Latex Gloves *MEDICAL DEVICES AND SUPPLIES*  Medication History  Oxycodone-Acetaminophen (10-325MG  Tablet, Oral as needed) Active. ALPRAZolam (0.5MG  Tablet, Oral as needed) Active. AmLODIPine Besylate (5MG  Tablet, Oral) Active. Furosemide (40MG  Tablet, Oral) Active. Gabapentin (600MG  Tablet, Oral) Active. Klor-Con M20 [facility] Tablet ER, Oral) Active. Ramipril (10MG  Capsule, Oral) Active. TiZANidine HCl (4MG  Tablet, Oral as needed) Active. Venlafaxine HCl ER (150MG  Capsule ER 24HR, Oral) Active. Vitamin C (1000MG  Tablet, Oral) Active. Aspirin (81MG  Tablet Chewable, Oral) Active. Zetia (10MG  Tablet, Oral) Active. Crestor (20MG  Tablet, Oral) Active. Narcan (4MG /0.1ML Liquid, Nasal) Active. Centrum Adults (Oral) Active. Medications Reconciled    Review of Systems  All other systems negative  Vitals  Wt Readings from Last 3 Encounters:  01/22/15 66.452 kg (146 lb 8 oz)  12/18/14 67.586 kg (149 lb)  12/15/14 67.586 kg (149 lb)   Temp Readings from Last 3 Encounters:  01/22/15 98.5 F (36.9 C)   12/06/14 98.6 F (37 C)   10/04/14 98.1 F (36.7 C) Oral   BP Readings from Last 3 Encounters:  01/22/15 146/88  12/15/14 150/60  12/06/14 129/77   Pulse Readings from Last 3 Encounters:  01/22/15 87  12/15/14 69  12/06/14 95      Physical Exam  General Mental Status-Alert. General Appearance-Consistent with stated age. Hydration-Well hydrated. Voice-Normal.  Chest and Lung Exam Chest and lung exam reveals -quiet, even and easy  respiratory effort with no use of accessory muscles. Inspection Chest Wall - Normal. Back - normal.  Breast Note: Breast is without evidence of erythema. Swelling resolved. Incision is  soft.   Cardiovascular Cardiovascular examination reveals -normal pedal pulses bilaterally. Note: regular rate and rhythm    Assessment & Plan PRIMARY CANCER OF RIGHT FEMALE BREAST (174.9  C50.911) Impression: Patient has made a decision to proceed with mastectomy with immediate reconstruction with tissue expander. This will be in conjunction with Dr. Harlow Mares. We do not need to do a sentinel node biopsy as this has already been performed. I reviewed the surgery with the patient. I discussed the risks of surgery including bleeding, infection, damage to adjacent structures, numbness, dissatisfaction with the appearance, recurrence of cancer, and possible need for additional procedures or surgeries, possible heart or lung complications. She understands and wishes to proceed. I will discuss with Dr. Harlow Mares the location of the incision. We may be able to extend her lumpectomy incision, but he may prefer to place a new incision. I will discuss this with him. We also discussed the possibility out of a contralateral mastectomy. I reviewed that there is no data that this would be beneficial to her survival. And although the extent of her DCIS was not evident on mammograms, she did not have an invasive cancer. I discussed that bilateral mastectomies leads to more complications which can also be problematic for patients.     Signed by [name], MD

## 2015-01-25 NOTE — Brief Op Note (Signed)
01/25/2015  4:27 PM  PATIENT:  Connie Bennett  64 y.o. female  PRE-OPERATIVE DIAGNOSIS:  RIGHT BREAST CANCER  POST-OPERATIVE DIAGNOSIS:  Right Breast Cancer  PROCEDURE:  Procedure(s): RIGHT NIPPLE SPARING MASTECTOMY (Right) RIGHT BREAST RECONSTRUCTION WITH PLACEMENT OF IMPLANT AND ACELLULAR DERMAL MATRIX  (Right)  SURGEON:  Surgeon(s) and Role: Panel 1:    * [name], MD - Primary  Panel 2:    * Crissie Reese, MD - Primary  PHYSICIAN ASSISTANT:   ASSISTANTS: Judyann Munson, RNFA   ANESTHESIA:   general  EBL:  Total I/O In: 2000 [I.V.:2000] Out: 17 [Urine:800; Blood:170]  BLOOD ADMINISTERED:none  DRAINS: (2 ) Jackson-Pratt drain(s) with closed bulb suction in the right mastectomy space   LOCAL MEDICATIONS USED:  NONE  SPECIMEN:  No Specimen  DISPOSITION OF SPECIMEN:  N/A  COUNTS:  YES  TOURNIQUET:  * No tourniquets in log *  DICTATION: .Other Dictation: Dictation Number [id number]  PLAN OF CARE: Admit to inpatient   PATIENT DISPOSITION:  PACU - hemodynamically stable.   Delay start of Pharmacological VTE agent (>24hrs) due to surgical blood loss or risk of bleeding: no

## 2015-01-25 NOTE — Progress Notes (Signed)
ANTIBIOTIC CONSULT NOTE - INITIAL  Pharmacy Consult:  Vancomycin Indication:  Surgical prophylaxis  Allergies  Allergen Reactions  . Carbocaine [Mepivacaine Hcl] Hives and Swelling  . Penicillins Nausea And Vomiting, Swelling and Rash    Has patient had a PCN reaction causing immediate rash, facial/tongue/throat swelling, SOB or lightheadedness with hypotension: Yes Has patient had a PCN reaction causing severe rash involving mucus membranes or skin necrosis: No Has patient had a PCN reaction that required hospitalization No Has patient had a PCN reaction occurring within the last 10 years: Yes If all of the above answers are "NO", then may proceed with Cephalosporin use.  . Sulfa Antibiotics Nausea And Vomiting, Swelling, Rash and Other (See Comments)    Headache   . Latex Rash    Reaction to gloves and bandaids (paper tape is ok)    Patient Measurements: Height: 5\' 3"  (160 cm) Weight: 146 lb (66.225 kg) IBW/kg (Calculated) : 52.4  Vital Signs: Temp: 98.1 F (36.7 C) (10/27 1821) Temp Source: Oral (10/27 1821) BP: 131/62 mmHg (10/27 1821) Pulse Rate: 80 (10/27 1821) Intake/Output from this shift: Total I/O In: 2000 [I.V.:2000] Out: 6468 [Urine:1420; Drains:60; Blood:170]  Labs: No results for input(s): WBC, HGB, PLT, LABCREA, CREATININE in the last 72 hours. Estimated Creatinine Clearance: 45.4 mL/min (by C-G formula based on Cr of 1.13). No results for input(s): VANCOTROUGH, VANCOPEAK, VANCORANDOM, GENTTROUGH, GENTPEAK, GENTRANDOM, TOBRATROUGH, TOBRAPEAK, TOBRARND, AMIKACINPEAK, AMIKACINTROU, AMIKACIN in the last 72 hours.   Microbiology: No results found for this or any previous visit (from the past 720 hour(s)).  Medical History: Past Medical History  Diagnosis Date  . Osteoporosis   . Mitral valve prolapse     MILD /   PER PT ASYMPTOMATIC  . Chronic fatigue syndrome   . Arthritis   . History of DVT of lower extremity     2008--  BILATERAL  . Hyperlipidemia      takes Zetia and Crestor  daily  . History of drug overdose     oxycontin  and oxycodone  Sept 2015--  now has narcan injection prescription  . Breast cancer [facility]) oncologist-  dr Truitt Merle--  right upper-outer quadrant     dx May 2016--- Stage 0  (Tis,N0,M0)  DCIS  Right breast---  09-13-2014  s/p  radioactive seed/ partial mastectomy / sln bx  . Fibromyalgia   . RSD (reflex sympathetic dystrophy)     left wrist and forearm from a fx  . Bilateral lower extremity edema     takes Furosemide daily as needed  . Sjogren's disease ([place])   . Chronic low back pain   . Anxiety     takes Xanax daily as needed  . Depression     takes Effexor daily  . HTN (hypertension)     takes Amlodipine and Ramipril daily  . Muscle spasm     takes Zanaflex daily as needed  . RSD (reflex sympathetic dystrophy)   . Family history of adverse reaction to anesthesia     mom hard to wake up  . History of bronchitis > 54yrs ago  . Headache(784.0)     couple of times a week  . Vertigo     takes Meclizine daily   . Peripheral neuropathy (HCC)     takes Neurontin daily  . Joint pain   . History of hiatal hernia   . History of colon polyps     benign  . Heart murmur   . Peripheral vascular disease ([place])  hx dvt's  . Complication of anesthesia     spinal cord stimulator- to be off for surgery      Assessment: [name] with history of breast cancer presented for right nipple sparing mastectomy and reconstruction with placement of implant and acellular dermal matrix.  Pharmacy consulted to continue vancomycin post-op for surgical prophylaxis.  Pre-op labs reviewed.  Aware patient received vancomycin 1gm IV x 1 around 1130 and Cipro 400mg  IV x 1 around 1145 today.   Goal of Therapy:  Vancomycin trough level 10-15 mcg/ml   Plan:  - Vanc 1250mg  IV Q24H, start tomorrow - Monitor renal fxn, LOT and whether to obtain vanc trough - BMET in AM    Connie Bennett, PharmD, BCPS Pager:  [phone] 01/25/2015, 6:48 PM

## 2015-01-25 NOTE — Anesthesia Preprocedure Evaluation (Signed)
Anesthesia Evaluation  Patient identified by MRN, date of birth, ID band Patient awake    Reviewed: Allergy & Precautions, NPO status , Patient's Chart, lab work & pertinent test results  History of Anesthesia Complications Negative for: history of anesthetic complications  Airway Mallampati: II  TM Distance: >3 FB Neck ROM: Full    Dental  (+) Teeth Intact   Pulmonary neg shortness of breath, neg sleep apnea, neg COPD, neg recent URI, former smoker,    breath sounds clear to auscultation       Cardiovascular hypertension, Pt. on medications + Peripheral Vascular Disease   Rhythm:Regular     Neuro/Psych  Headaches, PSYCHIATRIC DISORDERS Anxiety Depression  Neuromuscular disease    GI/Hepatic Neg liver ROS, hiatal hernia,   Endo/Other  negative endocrine ROS  Renal/GU negative Renal ROS     Musculoskeletal  (+) Arthritis , Fibromyalgia -  Abdominal   Peds  Hematology negative hematology ROS (+)   Anesthesia Other Findings   Reproductive/Obstetrics                             Anesthesia Physical Anesthesia Plan  ASA: II  Anesthesia Plan:    Post-op Pain Management: GA combined w/ Regional for post-op pain   Induction: Intravenous  Airway Management Planned: Oral ETT  Additional Equipment: None  Intra-op Plan:   Post-operative Plan: Extubation in OR  Informed Consent: I have reviewed the patients History and Physical, chart, labs and discussed the procedure including the risks, benefits and alternatives for the proposed anesthesia with the patient or authorized representative who has indicated his/her understanding and acceptance.   Dental advisory given  Plan Discussed with: CRNA and Surgeon  Anesthesia Plan Comments:         Anesthesia Quick Evaluation

## 2015-01-25 NOTE — Anesthesia Postprocedure Evaluation (Signed)
  Anesthesia Post-op Note  Patient: Connie Bennett  Procedure(s) Performed: Procedure(s): RIGHT NIPPLE SPARING MASTECTOMY (Right) RIGHT BREAST RECONSTRUCTION WITH PLACEMENT OF IMPLANT AND ACELLULAR DERMAL MATRIX  (Right)  Patient Location: PACU  Anesthesia Type:GA combined with regional for post-op pain  Level of Consciousness: awake  Airway and Oxygen Therapy: Patient Spontanous Breathing  Post-op Pain: mild  Post-op Assessment: Post-op Vital signs reviewed, Patient's Cardiovascular Status Stable, Respiratory Function Stable, Patent Airway, No signs of Nausea or vomiting and Pain level controlled              Post-op Vital Signs: Reviewed and stable  Last Vitals:  Filed Vitals:   01/25/15 1800  BP: 126/72  Pulse: 75  Temp:   Resp: 13    Complications: No apparent anesthesia complications

## 2015-01-25 NOTE — Op Note (Signed)
Right Nipple Sparing Mastectomy (to be followed by reconstruction)  Indications: This patient presents with history of Right breast cancer, s/p lumpectomy with reexcision and continued positive margin.    Pre-operative Diagnosis: right breast cancer, cTis  Post-operative Diagnosis: right breast cancer, same  Surgeon: [name]   Anesthesia: General endotracheal anesthesia and Local anesthesia 0.25.% bupivacaine, with epinephrine  ASA Class: 2  Procedure Details  The patient was seen in the Holding Room. The risks, benefits, complications, treatment options, and expected outcomes were discussed with the patient. The possibilities of reaction to medication, pulmonary aspiration, bleeding, infection, the need for additional procedures, failure to diagnose a condition, and creating a complication requiring transfusion or operation were discussed with the patient. The patient concurred with the proposed plan, giving informed consent.  The site of surgery properly noted/marked. The patient was taken to Operating Room # 16, identified as Margaree Sandhu and the procedure verified as Right nipple sparing Mastectomy. A Time Out was held and the above information confirmed.    After induction of anesthesia, the bilateral breast and chest were prepped and draped in standard fashion.   The borders of the breast were identified and marked.  The inframammary fold was marked.  An 8 cm incision was made in the inframammary fold.    Saline was infiltrated into the superior flap.  Mastectomy hooks were used to provide elevation of the skin edges, and the PlasmaBlade was used to create the mastectomy flaps.  The dissection was taken to the fascia of the pectoralis major.   The superior flap was taken medially to the lateral sternal border, superiorly to the inferior border of the clavicle and laterally to the latissimus dorsi. The breast was taken off including the pectoralis fascia and the axillary tail marked.     The wound was irrigated. Hemostasis was achieved with cautery.  2 sponges with antibiotic saline were placed in the wound for Dr. Harlow Mares to take over with reconstruction.  .    Sterile dressings were applied. At the end of the operation, all sponge, instrument, and needle counts were correct.  Findings: grossly clear surgical margins  Estimated Blood Loss:  less than 100 mL         Drains: per Dr. Harlow Mares                Specimens: R breast         Complications:  None; patient tolerated the procedure well.         Disposition: remained in OR for reconstruction         Condition: stable

## 2015-01-25 NOTE — Anesthesia Procedure Notes (Addendum)
Procedure Name: Intubation Date/Time: 01/25/2015 12:37 PM Performed by: Duke Salvia Pre-anesthesia Checklist: Patient identified, Emergency Drugs available, Suction available and Patient being monitored Patient Re-evaluated:Patient Re-evaluated prior to inductionOxygen Delivery Method: Circle system utilized and Simple face mask Preoxygenation: Pre-oxygenation with 100% oxygen Intubation Type: IV induction Ventilation: Mask ventilation without difficulty Laryngoscope Size: Mac and 4 Grade View: Grade II Tube type: Oral Number of attempts: 1 Placement Confirmation: ETT inserted through vocal cords under direct vision,  positive ETCO2 and breath sounds checked- equal and bilateral Secured at: 19 cm Tube secured with: Tape Dental Injury: Teeth and Oropharynx as per pre-operative assessment    Epidural   Staffing Anesthesiologist: Madilyn Cephas Performed by: anesthesiologist   Preanesthetic Checklist Completed: patient identified, site marked, surgical consent, pre-op evaluation, timeout performed, IV checked, risks and benefits discussed, monitors and equipment checked and post-op pain management  Epidural Patient position: sitting Prep: DuraPrep Patient monitoring: heart rate, cardiac monitor, continuous pulse ox and blood pressure Approach: right paramedian Location: thoracic (1-12) Injection technique: LOR air  Needle:  Needle type: Tuohy  Needle gauge: 17 G Needle length: 9 cm Needle insertion depth: 6 cm Test dose: negative and Other  Assessment Events: blood not aspirated, injection not painful, no injection resistance, negative IV test and no paresthesia  Additional Notes Single shot thoracic paravertebral with LOR at 6cm. Procedure assisted by Rolm Gala for block:post-op pain management

## 2015-01-25 NOTE — Transfer of Care (Signed)
Immediate Anesthesia Transfer of Care Note  Patient: Connie Bennett  Procedure(s) Performed: Procedure(s): RIGHT NIPPLE SPARING MASTECTOMY (Right) RIGHT BREAST RECONSTRUCTION WITH PLACEMENT OF IMPLANT AND ACELLULAR DERMAL MATRIX  (Right)  Patient Location: PACU  Anesthesia Type:General and Regional  Level of Consciousness: awake, alert , oriented and patient cooperative  Airway & Oxygen Therapy: Patient Spontanous Breathing and Patient connected to nasal cannula oxygen  Post-op Assessment: Report given to RN and Post -op Vital signs reviewed and stable  Post vital signs: Reviewed and stable  Last Vitals:  Filed Vitals:   01/25/15 1635  BP:   Pulse: 77  Temp:   Resp: 12    Complications: No apparent anesthesia complications

## 2015-01-26 ENCOUNTER — Encounter (HOSPITAL_COMMUNITY): Payer: Self-pay | Admitting: General Practice

## 2015-01-26 LAB — BASIC METABOLIC PANEL
Anion gap: 11 (ref 5–15)
BUN: 12 mg/dL (ref 6–20)
CO2: 24 mmol/L (ref 22–32)
Calcium: 9.5 mg/dL (ref 8.9–10.3)
Chloride: 104 mmol/L (ref 101–111)
Creatinine, Ser: 1.19 mg/dL — ABNORMAL HIGH (ref 0.44–1.00)
GFR calc Af Amer: 54 mL/min — ABNORMAL LOW (ref >60)
GFR calc non Af Amer: 47 mL/min — ABNORMAL LOW (ref >60)
Glucose, Bld: 210 mg/dL — ABNORMAL HIGH (ref 65–99)
Potassium: 4.4 mmol/L (ref 3.5–5.1)
Sodium: 139 mmol/L (ref 135–145)

## 2015-01-26 MED ORDER — ENOXAPARIN SODIUM 40 MG/0.4ML ~~LOC~~ SOLN: 40.0000 mg | SUBCUTANEOUS | Status: DC

## 2015-01-26 MED ORDER — OXYCODONE HCL 5 MG PO TABS: 5.0000 mg | ORAL_TABLET | ORAL | Status: DC | PRN

## 2015-01-26 MED ORDER — OXYCODONE-ACETAMINOPHEN 5-325 MG PO TABS: 1.0000 | ORAL_TABLET | ORAL | Status: DC | PRN

## 2015-01-26 MED ORDER — DOXYCYCLINE HYCLATE 100 MG PO TABS: 100.0000 mg | ORAL_TABLET | Freq: Two times a day (BID) | ORAL | Status: DC

## 2015-01-26 MED ORDER — DOXYCYCLINE HYCLATE 100 MG PO TABS
100.0000 mg | ORAL_TABLET | Freq: Two times a day (BID) | ORAL | Status: DC
  Administered 2015-01-26: 100 mg via ORAL
  Filled 2015-01-26: qty 1

## 2015-01-26 NOTE — Progress Notes (Signed)
Discharge instructions given. Jp drains emptied with teach back. Iv removed

## 2015-01-26 NOTE — Discharge Instructions (Addendum)
No lifting for 6 weeks No vigorous activity for 6 weeks (including outdoor walks) No driving for 4 weeks OK to walk up stairs slowly Stay propped up Use incentive spirometer at home every hour while awake No shower while drains are in place Empty drains at least three times a day and record the amounts separately Change drain dressings every third day if instructed to do so by Dr. Harlow Mares (No need to do this yet)  Apply Bacitracin antibiotic ointment to the drain sites  Place gauze dressing over drains  Secure the gauze with tape Take an over-the-counter Probiotic while on antibiotics Take an over-the-counter stool softener (such as Colace) while on pain medication Lovenox injection this afternoon and everyday at about the same time See Dr. Harlow Mares next week For questions call [phone] or [phone]

## 2015-01-26 NOTE — Op Note (Signed)
NAMEFRANCESKA, Connie Bennett NO.:  [id number]  MEDICAL RECORD NO.:  [id number]  LOCATION:  [id number]                        FACILITY:  [facility]  PHYSICIAN:  Crissie Reese, M.D.     DATE OF BIRTH:  27-Mar-1951  DATE OF PROCEDURE:  01/25/2015 DATE OF DISCHARGE:                              OPERATIVE REPORT   PREOPERATIVE DIAGNOSIS:  Right breast cancer.  POSTOPERATIVE DIAGNOSIS:  Right breast cancer.  PROCEDURE PERFORMED: 1. Right immediate breast reconstruction with saline implant. 2. Distinct procedure chest wall reconstruction with 100 cm2 of     acellular dermal matrix for inadequate muscle coverage and     resetting of inframammary fold.  SURGEON:  Crissie Reese, MD  ASSISTANT:  Judyann Munson, RNFA  ANESTHESIA:  General.  ESTIMATED BLOOD LOSS:  10 mL.  DRAINS:  Two 19-French.  CLINICAL NOTE:  A 64 year old woman has right breast cancer who is having nipple sparing mastectomy.  She desired reconstruction and options were discussed.  She selected implant based reconstruction with the understanding that it may be necessary to place a tissue expander at the time of the surgery.  The nature of this procedure and risks plus complications were discussed with her.  These risks include, but not limited to, bleeding, infection, healing problems, scarring, loss of sensation in the nipple, fluid accumulations, anesthesia-related complications, pneumothorax, DVT, PE, failure of device, capsular contracture, displacement of device, wrinkles and ripples, contour deformities, contour deformities of the periphery of the reconstruction, chronic pain, asymmetry, and overall disappointment.  She understood all this and wished to proceed.  DESCRIPTION OF PROCEDURE:  The patient was in the operating room, and the nipple sparing mastectomy completed.  The space was inspected and the nipple complex also inspected and found to have excellent color and no evidence of any vascular  compromise to any areas of the mastectomy flap.  Irrigation with saline as well as antibiotic solution.  The acellular dermal matrix was soaked in saline for greater than 20 minutes and then stripped of fluid, pie-crusted, and then soaked in antibiotic solution for greater than 20 minutes.  After thoroughly cleaning gloves, the dissection was carried out deep to the pectoralis major muscle, releasing the muscle inferiorly just at the inframammary fold.  The irrigation with saline and then hemostasis with electrocautery.  The acellular dermal matrix was then positioned with the dermal side facing up towards the overlying mastectomy flap and the epidermal side facing down towards the submuscular space where the implant would be positioned.  The Sizer was placed and a 450 mL size appeared to give her good symmetry of the opposite side.  The acellular dermal matrix inset along the lateral border with 3-0 PDS simple interrupted sutures and then inferiorly along the inframammary fold, 3-0 PDS simple interrupted sutures and then pre-placed, but left untied to the muscle edge.  After thoroughly cleaning gloves, the implant was prepared.  This was a Product manager high-profile smooth round saline implants, 100 mL sterile saline placed using closed filling system and the implant was returned to the antibiotic solution to soak.  The space was inspected and there was excellent hemostasis was confirmed.  The implant was positioned after again placing antibiotic irrigation  in the submuscular space.  Care was taken to make sure the implant was oriented properly without folds or wrinkles.  It was filled with the maximum 450 mL using sterile saline via a closed filling system.  Antibiotic solution used to irrigate and then the 3-0 PDS sutures were then tied securely, thus creating good coverage of the implant.  Two 19-French drains positioned, brought through separate stab wounds inferiorly, 1 inferomedially,  other inferolateral and secured with 3-0 Prolene sutures.  The inframammary incision was then debrided along its superior border where there had been retraction.  This was excised and the skin edges had good bleeding consistent with viability.  The closure then with 3-0 Monocryl inverted deep dermal sutures and a few 3-0 Prolene simple sutures for reinforcement.  A Telfa and a dry sterile dressing were placed. Biopatch and SorbaView were used to dress the drains and a dry sterile dressing was placed over the breast and the chest vest was positioned. She was transferred to recovery in stable having tolerated procedure well.  DISPOSITION:  She will be observed overnight in the hospital.     Crissie Reese, M.D.     DB/MEDQ  D:  01/25/2015  T:  01/26/2015  Job:  [id number]  cc:   Crissie Reese, M.D.'s Office

## 2015-01-26 NOTE — Discharge Summary (Signed)
Physician Discharge Summary  Patient ID: Connie Bennett MRN: [id number] DOB/AGE: 09/11/1950 64 y.o.  Admit date: 01/25/2015 Discharge date: 01/26/2015  Admission Diagnoses:Right breast cancer  Discharge Diagnoses: Same Active Problems:   Status post mastectomy   Discharged Condition: good  Hospital Course: On the day of admission the patient was taken to surgery and had right mastectomy and implant reconstruction. The patient tolerated the procedures well. Postoperatively, the flap maintained excellent color and capillary refill. The patient was ambulatory and tolerating diet on the first postoperative day. She is doing well.  Treatments: antibiotics: vancomycin, cipro anticoagulation: heparin and surgery: right mastectomy and implant reconstruction  Discharge Exam: Blood pressure 107/41, pulse 74, temperature 97.4 F (36.3 C), temperature source Oral, resp. rate 15, height 5\' 3"  (1.6 m), weight 146 lb (66.225 kg), SpO2 94 %.  Operative sites: Mastectomy flaps viable. Implant in good position. Drains functioning. Drainage thin. There is no evidence of bleeding or infection.  Disposition: 01-Home or Self Care     Medication List    STOP taking these medications        ascorbic acid 1000 MG tablet  Commonly known as:  VITAMIN C     aspirin EC 81 MG tablet     fish oil-omega-3 fatty acids 1000 MG capsule     Flax Seed Oil 1000 MG Caps     multivitamin with minerals Tabs tablet     oxyCODONE-acetaminophen 10-325 MG tablet  Commonly known as:  PERCOCET  Replaced by:  oxyCODONE-acetaminophen 5-325 MG tablet      TAKE these medications        ALPRAZolam 0.5 MG tablet  Commonly known as:  XANAX  Take 0.5-1 mg by mouth 2 (two) times daily as needed for anxiety. Take 1 tablet (0.5 mg) by mouth if needed during the day, and 2 tablets (1 mg) if needed at bedtime     amLODipine 5 MG tablet  Commonly known as:  NORVASC  Take 5 mg by mouth daily.     calcium carbonate  600 MG Tabs tablet  Commonly known as:  OS-CAL  Take 600 mg by mouth daily with breakfast.     cyclobenzaprine 10 MG tablet  Commonly known as:  FLEXERIL  Take 10 mg by mouth 2 (two) times daily as needed for muscle spasms.     doxycycline 100 MG tablet  Commonly known as:  VIBRA-TABS  Take 1 tablet (100 mg total) by mouth every 12 (twelve) hours.     enoxaparin 40 MG/0.4ML injection  Commonly known as:  LOVENOX  Inject 0.4 mLs (40 mg total) into the skin daily.     ezetimibe 10 MG tablet  Commonly known as:  ZETIA  Take 10 mg by mouth at bedtime.     furosemide 40 MG tablet  Commonly known as:  LASIX  Take 40 mg by mouth daily as needed for fluid or edema (leg swelling).     gabapentin 600 MG tablet  Commonly known as:  NEURONTIN  Take 600 mg by mouth 3 (three) times daily.     lidocaine 5 %  Commonly known as:  LIDODERM  Place 1 patch onto the skin daily as needed (pain). PRN   ----Remove & Discard patch within 12 hours or as directed by MD     meclizine 25 MG tablet  Commonly known as:  ANTIVERT  Take 25 mg by mouth 3 (three) times daily as needed for dizziness (vertigo).     OVER THE COUNTER  MEDICATION  Take 1 drop by mouth daily as needed (dry eyes). OTC lubricating eye drops     oxyCODONE 5 MG immediate release tablet  Commonly known as:  Oxy IR/ROXICODONE  Take 1 tablet (5 mg total) by mouth every 4 (four) hours as needed for moderate pain or severe pain.     oxyCODONE-acetaminophen 5-325 MG tablet  Commonly known as:  PERCOCET/ROXICET  Take 1-2 tablets by mouth every 4 (four) hours as needed for moderate pain or severe pain.     potassium chloride SA 20 MEQ tablet  Commonly known as:  K-DUR,KLOR-CON  Take 20 mEq by mouth daily as needed (with furosemide (lasix) dose).     ramipril 10 MG tablet  Commonly known as:  ALTACE  Take 10 mg by mouth daily.     rosuvastatin 20 MG tablet  Commonly known as:  CRESTOR  Take 20 mg by mouth at bedtime.      tiZANidine 4 MG tablet  Commonly known as:  ZANAFLEX  Take 4-8 mg by mouth at bedtime as needed for muscle spasms. May take twice a day if needed     venlafaxine XR 150 MG 24 hr capsule  Commonly known as:  EFFEXOR-XR  Take 150 mg by mouth See admin instructions. Take 1 capsule (150 mg) by mouth daily with breakfast and lunch         Signed: Daequan Kozma M 01/26/2015, 9:15 AM

## 2015-01-26 NOTE — Progress Notes (Signed)
1 Day Post-Op  Subjective: Doing well.  Pain tolerable.  Sore.    Objective: Vital signs in last 24 hours: Temp:  [97.3 F (36.3 C)-98.5 F (36.9 C)] 97.4 F (36.3 C) (10/28 0544) Pulse Rate:  [65-84] 74 (10/28 0544) Resp:  [8-31] 15 (10/28 0544) BP: (100-169)/(41-87) 107/41 mmHg (10/28 0544) SpO2:  [93 %-100 %] 94 % (10/28 0544) Weight:  [66.225 kg (146 lb)-66.452 kg (146 lb 8 oz)] 66.225 kg (146 lb) (10/27 1821) Last BM Date: 01/24/15  Intake/Output from previous day: 10/27 0701 - 10/28 0700 In: 3178 [P.O.:350; I.V.:2828] Out: 2255 [Urine:1820; Drains:265; Blood:170] Intake/Output this shift:    General appearance: alert, cooperative, appears stated age and no distress Breasts: right nipple looks well perfused, as well as skin.  no evidence of vascular compromise.    Lab Results:  No results for input(s): WBC, HGB, HCT, PLT in the last 72 hours. BMET  Recent Labs  01/26/15 0300  NA 139  K 4.4  CL 104  CO2 24  GLUCOSE 210*  BUN 12  CREATININE 1.19*  CALCIUM 9.5   PT/INR No results for input(s): LABPROT, INR in the last 72 hours. ABG No results for input(s): PHART, HCO3 in the last 72 hours.  Invalid input(s): PCO2, PO2  Studies/Results: No results found.  Anti-infectives: Anti-infectives    Start     Dose/Rate Route Frequency Ordered Stop   01/26/15 0800  vancomycin (VANCOCIN) 1,250 mg in sodium chloride 0.9 % 250 mL IVPB     1,250 mg 166.7 mL/hr over 90 Minutes Intravenous Every 24 hours 01/25/15 1849     01/25/15 1525  polymyxin B 500,000 Units, bacitracin 50,000 Units in sodium chloride irrigation 0.9 % 500 mL irrigation  Status:  Discontinued       As needed 01/25/15 1526 01/25/15 1625   01/25/15 1200  vancomycin (VANCOCIN) IVPB 1000 mg/200 mL premix     1,000 mg 200 mL/hr over 60 Minutes Intravenous To [facility] 01/24/15 1025 01/25/15 1216   01/25/15 1130  ciprofloxacin (CIPRO) IVPB 400 mg     400 mg 200 mL/hr over 60 Minutes  Intravenous To [facility] 01/24/15 1002 01/25/15 1230      Assessment/Plan: s/p Procedure(s): RIGHT NIPPLE SPARING MASTECTOMY (Right) RIGHT BREAST RECONSTRUCTION WITH PLACEMENT OF IMPLANT AND ACELLULAR DERMAL MATRIX  (Right) Measure drain output  Drain teaching. D/c per Dr. Harlow Mares.    LOS: 1 day    [facility] 01/26/2015

## 2015-01-30 NOTE — Progress Notes (Signed)
Quick Note:  Please let patient know all margins are negative. No invasive cancer was seen. Additional NON invasive cancer was seen. ______

## 2015-02-01 ENCOUNTER — Other Ambulatory Visit: Payer: Self-pay | Admitting: *Deleted

## 2015-02-05 ENCOUNTER — Telehealth: Payer: Self-pay | Admitting: Hematology

## 2015-02-05 NOTE — Telephone Encounter (Signed)
Called patient re post op f/u YF. Patient did not want to schedule at this time. Per patient she was told she would not need to f/u w/YF. Per patient she sees Dr. Barry Dienes 11/14 and will see what see says and call us back. Navigatior (KM) informed.

## 2015-02-09 ENCOUNTER — Ambulatory Visit: Payer: PPO | Admitting: Hematology

## 2015-02-12 ENCOUNTER — Other Ambulatory Visit: Payer: Self-pay | Admitting: *Deleted

## 2015-02-12 DIAGNOSIS — C50411 Malignant neoplasm of upper-outer quadrant of right female breast: Secondary | ICD-10-CM

## 2015-02-15 ENCOUNTER — Encounter: Payer: Self-pay | Admitting: Cardiology

## 2015-02-20 ENCOUNTER — Telehealth: Payer: Self-pay | Admitting: Hematology

## 2015-02-20 NOTE — Telephone Encounter (Signed)
Returned patient call and per pt rescheduled f/u with YF due to per pt Dr. Barry Dienes told her she needs to see YF, however patient declined SCP visit. Message to Blanchie Serve and [employer]

## 2015-02-26 ENCOUNTER — Other Ambulatory Visit (HOSPITAL_COMMUNITY)
Admission: RE | Admit: 2015-02-26 | Discharge: 2015-02-26 | Disposition: A | Discharge status: Home / Self Care | Payer: PPO | Source: Ambulatory Visit | Attending: General Surgery | Admitting: General Surgery

## 2015-02-26 DIAGNOSIS — C50919 Malignant neoplasm of unspecified site of unspecified female breast: Secondary | ICD-10-CM | POA: Insufficient documentation

## 2015-03-02 ENCOUNTER — Telehealth: Payer: Self-pay | Admitting: Hematology

## 2015-03-02 ENCOUNTER — Encounter: Payer: Self-pay | Admitting: Hematology

## 2015-03-02 ENCOUNTER — Ambulatory Visit (HOSPITAL_BASED_OUTPATIENT_CLINIC_OR_DEPARTMENT_OTHER): Payer: PPO | Admitting: Hematology

## 2015-03-02 VITALS — BP 153/53 | HR 83 | Temp 98.1°F | Resp 17 | Ht 63.0 in | Wt 150.6 lb

## 2015-03-02 DIAGNOSIS — C50411 Malignant neoplasm of upper-outer quadrant of right female breast: Secondary | ICD-10-CM

## 2015-03-02 DIAGNOSIS — Z171 Estrogen receptor negative status [ER-]: Secondary | ICD-10-CM

## 2015-03-02 DIAGNOSIS — D0511 Intraductal carcinoma in situ of right breast: Secondary | ICD-10-CM

## 2015-03-02 NOTE — Progress Notes (Signed)
[place] NOTE  Patient Care Team: Mayra Neer, MD as PCP - General (Family Medicine) Holley Bouche, NP as Nurse Practitioner (Nurse Practitioner) Candie Mile, RN as [place] Management  CHIEF COMPLAINTS:  Follow up right breat DCIS, ER/PR-  Oncology History   Breast cancer of upper-outer quadrant of right female breast [facility])   Staging form: Breast, AJCC 7th Edition     Clinical stage from 09/06/2014: Stage 0 (Tis (DCIS), N0, M0) - Signed by Truitt Merle, MD on 03/03/2015     Pathologic stage from 01/25/2015: Stage 0 (Tis (DCIS), N0, cM0) - Signed by Truitt Merle, MD on 03/03/2015        Breast cancer of upper-outer quadrant of right female breast ([place])   08/23/2014 Mammogram Mammogram and ultrasound showed a 3cm cystic lesion at 8:30 of right breast, and a 7 mm and a 5 mm increased density lesion at 10:00 of the right breast.   08/29/2014 Initial Diagnosis Breast cancer of upper-outer quadrant of right female breast   08/29/2014 Receptors her2 ER-, PR-   08/29/2014 Initial Biopsy 2 right breast biopsy showed high-grade DCIS, there is a focus of mildly suspicious for early stromal invasion. The cyst aspiration cytology was negative.   09/13/2014 Pathology Results right breast lumpectomy showed high-grade DCIS, positive lateral and medial margins, 5 sentinel lymph nodes were negative.   09/13/2014 Surgery right breast lumpectomy and 5 sentinel lymph nodes.   09/29/2014 Pathology Results multiple breast margins re-exertion, high-grade DCIS, measuring 1.0 cm, 3.0 cm, 2.0 cm, some margins are still positive or very close.    09/29/2014 Surgery re-excision for positive margins   01/25/2015 Surgery right breast simple mastectomy.   01/25/2015 Pathology Results right breast mastectomy showedDCIS, high-grade, 9.0 cm. Lobular carcinoma in situ. Surgical margins were negative. All lymph nodes were negative    HISTORY OF PRESENTING ILLNESS:  Connie Bennett  64 y.o. female is here because of recent diagnosis of right breast DCIS  On her recent follow-up with her primary care physician, a lump in her right breat was found on exam, and she was sent for mammogram. Her diagnostic mammogram and ultrasound on 08/19/2014 showed a 3 cm cystic lesion, an additional 7 mm and a 5 mm increased density lesion at 10:00 of her right breast. She has right breast cysts before which was biopsied before. She underwent core needle biopsy on 08/29/2014, which showed high-grade DCIS. The aspiration of the cystic lesion showed no malignant cells on cytology.  She has chronic diffuse arthritis pain, she had a back surgery twice and has a stimulator for pain control. She is able to take care of herself, and does some light housework. She walks around with a cane. She denies any recent change of her energy level, appetite or weight.  In terms of breast cancer risk profile:  She menarched at early age of 43 and went to menopause at age 89 She had 1 pregnancy, her first child was born at age 60  She did not breast-fed her child.  She received birth control pills for approximately 15 years.  She was never exposed to fertility medications, but use hormone replacement therapy for 3-5 years  She has  family history of Breast/GYN/GI cancer  INTERIM HISTORY: Darnice returns for follow-up. She is accompanied to the clinic by her husband. She initially underwent right breast lumpectomy and sentinel lymph node biopsy in June, reexcision for positive surgical margins in July, and finally she underwent right  breast mastectomy on 01/25/15, which reviewed a night 9.0 cm residual DCIS.the final surgical margins were negative. She has recovered well from the surgery, still has mild intermittent pain in the incision site, no limitation of the right shoulder mobility. She otherwise is back to her normal status.  MEDICAL HISTORY:  Past Medical History  Diagnosis Date  . Osteoporosis   . Mitral  valve prolapse     MILD /   PER PT ASYMPTOMATIC  . Chronic fatigue syndrome   . Arthritis   . History of DVT of lower extremity     2008--  BILATERAL  . Hyperlipidemia     takes Zetia and Crestor  daily  . History of drug overdose     oxycontin  and oxycodone  Sept 2015--  now has narcan injection prescription  . Breast cancer [facility]) oncologist-  dr Truitt Merle--  right upper-outer quadrant     dx May 2016--- Stage 0  (Tis,N0,M0)  DCIS  Right breast---  09-13-2014  s/p  radioactive seed/ partial mastectomy / sln bx  . Fibromyalgia   . RSD (reflex sympathetic dystrophy)     left wrist and forearm from a fx  . Bilateral lower extremity edema     takes Furosemide daily as needed  . Sjogren's disease ([place])   . Chronic low back pain   . Anxiety     takes Xanax daily as needed  . Depression     takes Effexor daily  . HTN (hypertension)     takes Amlodipine and Ramipril daily  . Muscle spasm     takes Zanaflex daily as needed  . RSD (reflex sympathetic dystrophy)   . Family history of adverse reaction to anesthesia     mom hard to wake up  . History of bronchitis > 50yr ago  . Headache(784.0)     couple of times a week  . Vertigo     takes Meclizine daily   . Peripheral neuropathy (HCC)     takes Neurontin daily  . Joint pain   . History of hiatal hernia   . History of colon polyps     benign  . Heart murmur   . Peripheral vascular disease (HCC)     hx dvt's  . Complication of anesthesia     spinal cord stimulator- to be off for surgery    SURGICAL HISTORY: Past Surgical History  Procedure Laterality Date  . Foot surgery Bilateral 1996    [facility]  . Radioactive seed guided mastectomy with axillary sentinel lymph node biopsy Right 09/13/2014    Procedure: RADIOACTIVE SEED GUIDED PARTIAL MASTECTOMY WITH AXILLARY SENTINEL LYMPH NODE BIOPSY;  Surgeon: FStark Klein MD;  [place] [facility]  Service: General;  Laterality: Right;  . Posterior lumbar  fusion  01-30-2009    L4--5 Laminectmy w/ decompression/  bilateral L4--5 microdiskectomy and fusion  . Right inferior parathyroidectomy  04-07-2006  . Spinal cord stimulator implant  2013  . Tonsillectomy  as child  . Lumbar disc surgery    . Right carpal tunnel release/  trigger release right middle finger  06-18-2006  . Orif left wrist fx's/  left carpal tunnel release  1999  . Re-excision of breast lumpectomy Right 09/29/2014    Procedure: RE-EXCISION OF BREAST LUMPECTOMY;  Surgeon: FStark Klein MD;  [place] [facility]  Service: General;  Laterality: Right;  . Nasal sinus surgery    . Inner ear surgery Right   . Colonoscopy    .  Esophagogastroduodenoscopy    . Eye lid surgery Bilateral   . Mastectomy Right 01/25/2015    nipple sparing   . Simple mastectomy with axillary sentinel node biopsy Right 01/25/2015    Procedure: RIGHT NIPPLE SPARING MASTECTOMY;  Surgeon: [name], MD;  Location: [place];  Service: General;  Laterality: Right;  . Breast reconstruction with placement of tissue expander and flex hd (acellular hydrated dermis) Right 01/25/2015    Procedure: RIGHT BREAST RECONSTRUCTION WITH PLACEMENT OF IMPLANT AND ACELLULAR DERMAL MATRIX ;  Surgeon: Crissie Reese, MD;  Location: [place];  Service: Plastics;  Laterality: Right;    SOCIAL HISTORY: Social History   Social History  . Marital Status: Married    Spouse Name: N/A  . Number of Children: 1  . Years of Education: N/A   Occupational History  . unemployed     hair stylist  . disabled    Social History Main Topics  . Smoking status: Former Smoker -- 1.00 packs/day for 25 years    Types: Cigarettes    Quit date: 09/29/2014  . Smokeless tobacco: Never Used     Comment: quit smoking Jan 2016  . Alcohol Use: No  . Drug Use: No  . Sexual Activity: Yes    Birth Control/ Protection: Post-menopausal   Other Topics Concern  . Not on file   Social History Narrative    FAMILY HISTORY: Family  History  Problem Relation Age of Onset  . Heart murmur Mother   . Cancer Mother 108    carcinoid tumor   . Heart murmur Sister     x2  . Cancer Maternal Aunt     lung cancer  . Cancer Paternal Aunt     breast cancer   . Cancer Maternal Aunt     gastric cancer   . Cancer Cousin     lung cancer  . Cancer Cousin     bone cancer   . Heart attack Maternal Uncle   . Heart attack Paternal Uncle   . Hypertension Mother   . Hypertension Son   . Stroke Neg Hx     ALLERGIES:  is allergic to carbocaine; penicillins; hydrocodone-acetaminophen; sulfa antibiotics; and latex.  MEDICATIONS:  Current Outpatient Prescriptions  Medication Sig Dispense Refill  . ALPRAZolam (XANAX) 0.5 MG tablet Take 0.5-1 mg by mouth 2 (two) times daily as needed for anxiety. Take 1 tablet (0.5 mg) by mouth if needed during the day, and 2 tablets (1 mg) if needed at bedtime    . amLODipine (NORVASC) 5 MG tablet Take 5 mg by mouth daily.     . calcium carbonate (OS-CAL) 600 MG TABS tablet Take 600 mg by mouth daily with breakfast.    . cyclobenzaprine (FLEXERIL) 10 MG tablet Take 10 mg by mouth 2 (two) times daily as needed for muscle spasms.    Marland Kitchen ezetimibe (ZETIA) 10 MG tablet Take 10 mg by mouth at bedtime.     . furosemide (LASIX) 40 MG tablet Take 40 mg by mouth daily as needed for fluid or edema (leg swelling).   1  . gabapentin (NEURONTIN) 600 MG tablet Take 600 mg by mouth 3 (three) times daily.      Marland Kitchen lidocaine (LIDODERM) 5 % Place 1 patch onto the skin daily as needed (pain). PRN   ----Remove & Discard patch within 12 hours or as directed by MD    . meclizine (ANTIVERT) 25 MG tablet Take 25 mg by mouth 3 (three) times daily as  needed for dizziness (vertigo).     Marland Kitchen OVER THE COUNTER MEDICATION Take 1 drop by mouth daily as needed (dry eyes). OTC lubricating eye drops    . oxyCODONE (OXY IR/ROXICODONE) 5 MG immediate release tablet Take 1 tablet (5 mg total) by mouth every 4 (four) hours as needed for moderate  pain or severe pain. 30 tablet 0  . oxyCODONE-acetaminophen (PERCOCET/ROXICET) 5-325 MG tablet Take 1-2 tablets by mouth every 4 (four) hours as needed for moderate pain or severe pain. 30 tablet 0  . potassium chloride SA (K-DUR,KLOR-CON) 20 MEQ tablet Take 20 mEq by mouth daily as needed (with furosemide (lasix) dose).    . ramipril (ALTACE) 10 MG tablet Take 10 mg by mouth daily.     . rosuvastatin (CRESTOR) 20 MG tablet Take 20 mg by mouth at bedtime.     Marland Kitchen tiZANidine (ZANAFLEX) 4 MG tablet Take 4-8 mg by mouth at bedtime as needed for muscle spasms. May take twice a day if needed    . venlafaxine (EFFEXOR-XR) 150 MG 24 hr capsule Take 150 mg by mouth See admin instructions. Take 1 capsule (150 mg) by mouth daily with breakfast and lunch     No current facility-administered medications for this visit.    REVIEW OF SYSTEMS:   Constitutional: Denies fevers, chills or abnormal night sweats Eyes: Denies blurriness of vision, double vision or watery eyes Ears, nose, mouth, throat, and face: Denies mucositis or sore throat Respiratory: Denies cough, dyspnea or wheezes Cardiovascular: Denies palpitation, chest discomfort or lower extremity swelling Gastrointestinal:  Denies nausea, heartburn or change in bowel habits Skin: Denies abnormal skin rashes Lymphatics: Denies new lymphadenopathy or easy bruising Neurological:Denies numbness, tingling or new weaknesses Behavioral/Psych: Mood is stable, no new changes  All other systems were reviewed with the patient and are negative.  PHYSICAL EXAMINATION: ECOG PERFORMANCE STATUS: 1 - Symptomatic but completely ambulatory  Filed Vitals:   03/02/15 1356  BP: 153/53  Pulse: 83  Temp: 98.1 F (36.7 C)  Resp: 17   Filed Weights   03/02/15 1356  Weight: 150 lb 9.6 oz (68.312 kg)    GENERAL:alert, no distress and comfortable SKIN: skin color, texture, turgor are normal, no rashes or significant lesions EYES: normal, conjunctiva are pink and  non-injected, sclera clear OROPHARYNX:no exudate, no erythema and lips, buccal mucosa, and tongue normal  NECK: supple, thyroid normal size, non-tender, without nodularity LYMPH:  no palpable lymphadenopathy in the cervical, axillary or inguinal LUNGS: clear to auscultation and percussion with normal breathing effort HEART: regular rate & rhythm and no murmurs and no lower extremity edema ABDOMEN:abdomen soft, non-tender and normal bowel sounds Musculoskeletal:no cyanosis of digits and no clubbing  PSYCH: alert & oriented x 3 with fluent speech NEURO: no focal motor/sensory deficits Breasts: s/p right mastectomy, surgical incision is healing well. No palpable lesion on the chest wall. Palpation of the left breasts and axilla revealed no obvious mass that I could appreciate.    LABORATORY DATA:  I have reviewed the data as listed Lab Results  Component Value Date   WBC 6.3 01/22/2015   HGB 10.9* 01/22/2015   HCT 34.0* 01/22/2015   MCV 92.1 01/22/2015   PLT 278 01/22/2015   Lab Results  Component Value Date   NA 139 01/26/2015   K 4.4 01/26/2015   CL 104 01/26/2015   CO2 24 01/26/2015   PATHOLOGY REPORT  Diagnosis 09/13/2014 1. Breast, lumpectomy, Right - HIGH GRADE DUCTAL CARCINOMA IN SITU WITH NECROSIS AND  CALCIFICATIONS, SEE COMMENT. - TUMOR IS FOCALLY PRESENT AT LATERAL MARGIN. - TUMOR IS BROADLY PRESENT AT MEDIAL MARGIN. - TUMOR IS 1 MM FROM NEAREST ANTERIOR, POSTERIOR, AND INFERIOR MARGIN. - PREVIOUS BIOPSY SITE. - SEE TUMOR SYNOPTIC TEMPLATE BELOW. 2. Lymph node, sentinel, biopsy, Right axilla #1 - ONE LYMPH NODE, NEGATIVE FOR TUMOR (0/1). 3. Lymph node, sentinel, biopsy, Right axilla #2 - ONE LYMPH NODE, NEGATIVE FOR TUMOR (0/1). 4. Lymph node, sentinel, biopsy, Right axilla #3 - ONE LYMPH NODE, NEGATIVE FOR TUMOR (0/1). 5. Lymph node, sentinel, biopsy, Right axilla #4 - ONE LYMPH NODE, NEGATIVE FOR TUMOR (0/1). 6. Lymph node, sentinel, biopsy, Right axilla #5 -  ONE LYMPH NODE, NEGATIVE FOR TUMOR (0/1).  Diagnosis 09/29/2014 1. Breast, excision, new anterior margin (right) - FIBROCYSTIC CHANGES WITH CALCIFICATIONS. - BIOPSY SITE REACTION. - NO MALIGNANCY IDENTIFIED. 2. Breast, excision, new medial margin (right) - HIGH GRADE DUCTAL CARCINOMA IN SITU, 1.0 CM IN GREATEST DIMENSION. - DUCTAL CARCINOMA IN SITU FOCALLY INVOLVES LATERAL MARGIN. - BIOPSY SITE REACTION. 3. Breast, excision, new inferior margin (right) - HIGH GRADE DUCTAL CARCINOMA IN SITU, 3.0 CM IN GREATEST DIMENSION. - DUCTAL CARCINOMA IN SITU FOCALLY LESS THAN 0.1 CM FROM POSTERIOR, INFERIOR, AND LATERAL MARGINS. - BIOPSY SITE REACTION. - SEE ONCOLOGY TABLE BELOW. 4. Breast, excision, new posterior margin (right) - FIBROCYSTIC CHANGES WITH CALCIFICATIONS. - NO MALIGNANCY IDENTIFIED. - BIOPSY SITE REACTION. 5. Breast, excision, new lateral margin (right) - HIGH GRADE DUCTAL CARCINOMA IN SITU, 2.0 CM IN GREATEST DIMENSION. - DUCTAL CARCINOMA IN SITU FOCALLY INVOLVES LATERAL MARGIN. - DUCTAL CARCINOMA IN SITU FOCALLY LESS THAN 0.1 CM FROM POSTERIOR MARGIN.   Diagnosis Breast, simple mastectomy, right 01/25/2015  - DUCTAL CARCINOMA IN SITU WITH CALCIFICATIONS, HIGH GRADE, SPANNING 9.0 CM. - LOBULAR NEOPLASIA (LOBULAR CARCINOMA IN SITU) - FIBROCYSTIC CHANGES WITH ADENOSIS AND CALCIFICATIONS. - THE SURGICAL RESECTION MARGINS ARE NEGATIVE FOR DUCTAL CARCINOMA. - THERE IS NO EVIDENCE OF CARCINOMA IN 1 OF 1 LYMPH NODE (0/1).   RADIOGRAPHIC STUDIES: I have personally reviewed the radiological images as listed and agreed with the findings in the report.  Bone density scan 06/28/2013  Osteopenia T-score -2.1   ASSESSMENT:  right breast DCIS s/p lumpectomy, ER/PR positive  PLAN:  1. extensive right breast DCIS, >10cm, high grade ER/PR negative -I reviewed her multiple surgical pathology results with patient and her husband in details. -she had extensive high-grade DCIS in the right  breast, required two re-excision for positive margins, including the final mastectomy, and the final surgical margins were negative. The total DCIS spans more than 10 cm -Given her tumor ER/PR negative status, I do not recommend chemoprevention with antiestrogen therapy. -no need for radiation after mastectomy -we discussed breast cancer surveillance. Her initial mammogram only showed a small mass in the right upper outer quadrant, so the majority of DCIS was not detected by the mammogram. We discussed that breast MRI is a better total to detect early breast cancer, however she is not able to do it due to her spinal pain stimulator.  -We usually recommend annual screening mammogram. Due to the extensive disease that she had, I would consider screening mammograms every 6 months if her insurance covers. She is going to call her insurance company to check.  2 Bone health Her last DEXA scan in 2015 showed osteopenia. I encouraged her to continue calcium and vitamin D replacement  3. Arthritis, chronic back pain, hypertension -She will continue follow-up with her primary care physician   Follow  up: I'll see her back in 6 month. If her insurance covers, we'll consider screening mammograms every 6 months.   All questions were answered. The patient knows to call the clinic with any problems, questions or concerns. I spent 20 minutes counseling the patient face to face. The total time spent in the appointment was 25 minutes and more than 50% was on counseling.     Truitt Merle, MD 03/03/2015 3:33 PM

## 2015-03-02 NOTE — Telephone Encounter (Signed)
Gave patient avs report and appointments for June.  °

## 2015-03-14 ENCOUNTER — Ambulatory Visit: Payer: PPO | Admitting: Cardiology

## 2015-03-14 ENCOUNTER — Other Ambulatory Visit: Payer: Self-pay

## 2015-03-14 NOTE — Patient Outreach (Signed)
[place] [facility]) Care Management  03/14/2015  COLINDA ZYLA 04-Sep-1950 [id number]  Patient referred for screening from HTA tier 3 list. Unsuccessful attempt to reach patient by phone.  HIPPA appropriate messages left at both home and cell numbers requesting call back. If no response Health Coach will call back within one week.  Candie Mile, RN, MSN [place] [phone] Fax [phone]

## 2015-03-16 ENCOUNTER — Ambulatory Visit: Payer: PPO | Admitting: Cardiology

## 2015-03-21 ENCOUNTER — Other Ambulatory Visit: Payer: Self-pay

## 2015-03-21 ENCOUNTER — Other Ambulatory Visit: Payer: Self-pay | Admitting: Orthopedic Surgery

## 2015-03-21 DIAGNOSIS — M48061 Spinal stenosis, lumbar region without neurogenic claudication: Secondary | ICD-10-CM

## 2015-03-21 NOTE — Patient Outreach (Signed)
[place] [facility]) Care Management  03/21/2015  Connie Bennett 21-Jan-1951 [id number]   Screening call completed today for patient referred from HTA Tier 3 list.  Patient identifiers of full name and date of birth provided.  Explanation of [facility] care management services provided. Patient is receptive to services, but wants to wait until after Christmas to make a decision about whether she wants to participate.  Recently patient has been dealing with a breast cancer diagnosis.  She had a mastectomy/reconstruction surgery on October 27th, 2016, but has not had to have follow-up with chemotherapy or radiation since then.  She saw her oncologist Dr. Burr Medico last on December 2nd.  She is being followed with phone calls from a nurse (?nurse navigator).  Patient reports she also goes to a [facility] clinic for treatment of fibromyalgia, and that she has back pain due to a disc problem.  Patient agreed for me to mail her a pamphlet with contact information.  States she will call me sometime after Christmas. I will schedule a follow-up phone call in January.  Candie Mile, RN, MSN [place] [phone] Fax [phone]

## 2015-03-23 ENCOUNTER — Encounter: Payer: Self-pay | Admitting: Nurse Practitioner

## 2015-03-23 DIAGNOSIS — C50411 Malignant neoplasm of upper-outer quadrant of right female breast: Secondary | ICD-10-CM

## 2015-03-23 NOTE — Progress Notes (Signed)
The Survivorship Care Plan was mailed to Connie Bennett as she reported not being able to come in to the Survivorship Clinic for an in-person visit at this time. A letter was mailed to her outlining the purpose of the content of the care plan, as well as encouraging her to reach out to me with any questions or concerns.  My business card was included in the correspondence to the patient as well.  A copy of the care plan was also routed/faxed/mailed to Connie Neer, MD, the patient's PCP.  I will not be placing any follow-up appointments to the Survivorship Clinic for [name], but I am happy to see her at any time in the future for any survivorship concerns that may arise. Thank you for allowing me to participate in her care!  Kenn File, [place] [phone]

## 2015-03-28 ENCOUNTER — Other Ambulatory Visit: Payer: PPO

## 2015-04-06 ENCOUNTER — Ambulatory Visit
Admission: RE | Admit: 2015-04-06 | Discharge: 2015-04-06 | Disposition: A | Discharge status: Home / Self Care | Payer: PPO | Source: Ambulatory Visit | Attending: Orthopedic Surgery | Admitting: Orthopedic Surgery

## 2015-04-06 DIAGNOSIS — M4326 Fusion of spine, lumbar region: Secondary | ICD-10-CM | POA: Diagnosis not present

## 2015-04-06 DIAGNOSIS — M5416 Radiculopathy, lumbar region: Secondary | ICD-10-CM | POA: Diagnosis not present

## 2015-04-06 DIAGNOSIS — M48061 Spinal stenosis, lumbar region without neurogenic claudication: Secondary | ICD-10-CM

## 2015-04-06 MED ORDER — METHYLPREDNISOLONE ACETATE 40 MG/ML INJ SUSP (RADIOLOG
120.0000 mg | Freq: Once | INTRAMUSCULAR | Status: AC
  Administered 2015-04-06: 120 mg via EPIDURAL

## 2015-04-06 MED ORDER — IOHEXOL 180 MG/ML  SOLN
1.0000 mL | Freq: Once | INTRAMUSCULAR | Status: AC | PRN
  Administered 2015-04-06: 1 mL via EPIDURAL

## 2015-04-06 NOTE — Discharge Instructions (Signed)

## 2015-04-10 ENCOUNTER — Other Ambulatory Visit: Payer: PPO

## 2015-04-11 ENCOUNTER — Other Ambulatory Visit: Payer: Self-pay

## 2015-04-11 NOTE — Patient Outreach (Signed)
[facility] [place] [facility]) Care Management  04/11/2015  [name] Dec 17, 1950 [id number]  Follow-up phone contact with patient referred from HTA Tier 3 list.  Spoke with patient initially in December to explain [facility] services.  At that time patient asked that I check back with her after the holidays.  Reviewed [facility] services.  Patient is interested in telephonic health coach disease management.  She requested a call back after she keeps 3 MD appointments scheduled for next week.  Plan: Appointment scheduled for 04-24-15.          Medical laboratory scientific officer with magnet and business card.  Candie Mile, RN, MSN [place] [phone] Fax [phone]

## 2015-04-12 ENCOUNTER — Ambulatory Visit: Payer: PPO | Admitting: Cardiology

## 2015-04-20 DIAGNOSIS — I129 Hypertensive chronic kidney disease with stage 1 through stage 4 chronic kidney disease, or unspecified chronic kidney disease: Secondary | ICD-10-CM | POA: Diagnosis not present

## 2015-04-20 DIAGNOSIS — C50919 Malignant neoplasm of unspecified site of unspecified female breast: Secondary | ICD-10-CM | POA: Diagnosis not present

## 2015-04-24 ENCOUNTER — Other Ambulatory Visit: Payer: Self-pay

## 2015-04-24 NOTE — Patient Outreach (Signed)
[place] [facility]) Care Management  04/24/2015  Connie Bennett [birth year] [id number]  Unsuccessful attempt to follow-up with patient referred from HTA Tier 3 list.  On 04-11-15 patient agreed to services, but requested the initial assessment be scheduled for 04-24-15 after she had completed several MD appointments. She was sent a Copy on 04-11-15.  HIPPA appropriate messages left on both numbers listed on EMR requesting call back.  RN Health Coach will make another attempt to reach patient within 5 working days.  Candie Mile, RN, MSN [place] [phone] Fax [phone]

## 2015-04-25 ENCOUNTER — Telehealth: Payer: Self-pay | Admitting: Nurse Practitioner

## 2015-04-25 DIAGNOSIS — M25551 Pain in right hip: Secondary | ICD-10-CM | POA: Diagnosis not present

## 2015-04-25 DIAGNOSIS — G8929 Other chronic pain: Secondary | ICD-10-CM | POA: Diagnosis not present

## 2015-04-25 DIAGNOSIS — Z5181 Encounter for therapeutic drug level monitoring: Secondary | ICD-10-CM | POA: Diagnosis not present

## 2015-04-25 DIAGNOSIS — Z79899 Other long term (current) drug therapy: Secondary | ICD-10-CM | POA: Diagnosis not present

## 2015-04-25 NOTE — Telephone Encounter (Signed)
Survivorship care plan that had previously been mailed to patient received back "return to sender."  Called and left message for patient asking for return call to confirm mailing address so that material could be resent.  Will await return call.

## 2015-04-26 ENCOUNTER — Other Ambulatory Visit: Payer: Self-pay

## 2015-04-26 NOTE — Patient Outreach (Signed)
[place] [facility]) Care Management  04/26/2015  Connie Bennett 1950/04/05 [id number]   Call back received from patient past message I left for her this morning.  She was initially referred from a HTA tier 3 list due to 2 ED visits and 3 admissions.    She states she did receive the [facility] information that I sent to her after our first conversation in December, but that for now she feels like everything is going well, and she does not see a need for [facility] services.  Patient has been under treatment for breast cancer by Dr. Burr Medico.  She has a mastectomy in October 2016, but reports that the cancer was contained, and she does not need any follow up treatment.  Dr. Burr Medico will see her again in 6 months.  Patient also reports that she saw her PCP on January 20th for a check up, and that she got a very good report, with her next appointment in 6 months.  Patient states she is celebrating her good news of surviving her cancer surgery, and not needing any follow up treatment. I congratulated and affirmed her on this.  Patient was encouraged to keep the [facility] information, and to feel free to call us at any time should she feel the need.  Plan: Case closure  Candie Mile, RN, MSN [place] [phone] Fax [phone]

## 2015-04-26 NOTE — Patient Outreach (Signed)
[place] [facility]) Care Management  04/26/2015  BRADEN MUNGIN 1950/12/08 [id number]   Unsuccessful attempt to reach patient.  HIPPA appropriate message left requesting call back. Patient requested [facility] services in December, but has since been unavailable to complete initial assessment.  If no response RN will make another attempt to reach patient within 5 working days.  Candie Mile, RN, MSN [place] [phone] Fax [phone]

## 2015-04-30 ENCOUNTER — Telehealth: Payer: Self-pay | Admitting: Nurse Practitioner

## 2015-04-30 NOTE — Telephone Encounter (Signed)
Previously attempted to mail survivorship care plan to patient, which was returned by [employer] due to incorrect address.  Called and spoke with patient who provided updated address.  Care plan mailed to new address and patient advised to call with any questions after receipt and review.  Pt doing well and without questions at this time.

## 2015-05-01 ENCOUNTER — Ambulatory Visit: Payer: PPO

## 2015-05-14 DIAGNOSIS — D0511 Intraductal carcinoma in situ of right breast: Secondary | ICD-10-CM | POA: Diagnosis not present

## 2015-05-14 DIAGNOSIS — C50411 Malignant neoplasm of upper-outer quadrant of right female breast: Secondary | ICD-10-CM | POA: Diagnosis not present

## 2015-05-23 DIAGNOSIS — C50911 Malignant neoplasm of unspecified site of right female breast: Secondary | ICD-10-CM | POA: Diagnosis not present

## 2015-05-30 DIAGNOSIS — Z91040 Latex allergy status: Secondary | ICD-10-CM | POA: Diagnosis not present

## 2015-05-30 DIAGNOSIS — Z880 Allergy status to penicillin: Secondary | ICD-10-CM | POA: Diagnosis not present

## 2015-05-30 DIAGNOSIS — Z888 Allergy status to other drugs, medicaments and biological substances status: Secondary | ICD-10-CM | POA: Diagnosis not present

## 2015-05-30 DIAGNOSIS — I10 Essential (primary) hypertension: Secondary | ICD-10-CM | POA: Diagnosis not present

## 2015-05-30 DIAGNOSIS — Z882 Allergy status to sulfonamides status: Secondary | ICD-10-CM | POA: Diagnosis not present

## 2015-05-30 DIAGNOSIS — M5417 Radiculopathy, lumbosacral region: Secondary | ICD-10-CM | POA: Diagnosis not present

## 2015-05-30 DIAGNOSIS — I341 Nonrheumatic mitral (valve) prolapse: Secondary | ICD-10-CM | POA: Diagnosis not present

## 2015-05-30 DIAGNOSIS — Z87891 Personal history of nicotine dependence: Secondary | ICD-10-CM | POA: Diagnosis not present

## 2015-05-30 DIAGNOSIS — Z7982 Long term (current) use of aspirin: Secondary | ICD-10-CM | POA: Diagnosis not present

## 2015-05-30 DIAGNOSIS — Z79899 Other long term (current) drug therapy: Secondary | ICD-10-CM | POA: Diagnosis not present

## 2015-05-30 DIAGNOSIS — Z7983 Long term (current) use of bisphosphonates: Secondary | ICD-10-CM | POA: Diagnosis not present

## 2015-05-30 DIAGNOSIS — R5382 Chronic fatigue, unspecified: Secondary | ICD-10-CM | POA: Diagnosis not present

## 2015-05-30 DIAGNOSIS — M25551 Pain in right hip: Secondary | ICD-10-CM | POA: Diagnosis not present

## 2015-05-30 DIAGNOSIS — M5136 Other intervertebral disc degeneration, lumbar region: Secondary | ICD-10-CM | POA: Diagnosis not present

## 2015-05-30 DIAGNOSIS — M797 Fibromyalgia: Secondary | ICD-10-CM | POA: Diagnosis not present

## 2015-05-30 DIAGNOSIS — G9050 Complex regional pain syndrome I, unspecified: Secondary | ICD-10-CM | POA: Diagnosis not present

## 2015-05-30 DIAGNOSIS — M1611 Unilateral primary osteoarthritis, right hip: Secondary | ICD-10-CM | POA: Diagnosis not present

## 2015-05-30 DIAGNOSIS — I447 Left bundle-branch block, unspecified: Secondary | ICD-10-CM | POA: Diagnosis not present

## 2015-05-30 DIAGNOSIS — M961 Postlaminectomy syndrome, not elsewhere classified: Secondary | ICD-10-CM | POA: Diagnosis not present

## 2015-06-19 DIAGNOSIS — Z853 Personal history of malignant neoplasm of breast: Secondary | ICD-10-CM | POA: Diagnosis not present

## 2015-06-19 DIAGNOSIS — N63 Unspecified lump in breast: Secondary | ICD-10-CM | POA: Diagnosis not present

## 2015-06-20 ENCOUNTER — Other Ambulatory Visit: Payer: Self-pay | Admitting: Radiology

## 2015-06-20 DIAGNOSIS — C50912 Malignant neoplasm of unspecified site of left female breast: Secondary | ICD-10-CM | POA: Diagnosis not present

## 2015-06-20 DIAGNOSIS — N63 Unspecified lump in breast: Secondary | ICD-10-CM | POA: Diagnosis not present

## 2015-06-25 ENCOUNTER — Other Ambulatory Visit: Payer: Self-pay | Admitting: Family Medicine

## 2015-06-25 ENCOUNTER — Other Ambulatory Visit: Payer: Self-pay | Admitting: General Surgery

## 2015-06-25 ENCOUNTER — Other Ambulatory Visit: Payer: Self-pay | Admitting: Hematology

## 2015-06-25 ENCOUNTER — Telehealth: Payer: Self-pay | Admitting: Hematology

## 2015-06-25 DIAGNOSIS — C50212 Malignant neoplasm of upper-inner quadrant of left female breast: Secondary | ICD-10-CM

## 2015-06-25 NOTE — Telephone Encounter (Signed)
per pof to sch pt appt-cld & spoke to pt and gave pt time & date of appt °

## 2015-06-27 ENCOUNTER — Other Ambulatory Visit (HOSPITAL_COMMUNITY): Payer: Self-pay | Admitting: Plastic Surgery

## 2015-06-27 DIAGNOSIS — C50412 Malignant neoplasm of upper-outer quadrant of left female breast: Secondary | ICD-10-CM | POA: Diagnosis not present

## 2015-06-29 ENCOUNTER — Encounter (HOSPITAL_COMMUNITY)
Admission: RE | Admit: 2015-06-29 | Discharge: 2015-06-29 | Disposition: A | Discharge status: Home / Self Care | Payer: PPO | Source: Ambulatory Visit | Attending: General Surgery | Admitting: General Surgery

## 2015-06-29 ENCOUNTER — Ambulatory Visit (HOSPITAL_BASED_OUTPATIENT_CLINIC_OR_DEPARTMENT_OTHER): Payer: PPO | Admitting: Hematology

## 2015-06-29 ENCOUNTER — Ambulatory Visit: Payer: PPO | Admitting: Hematology

## 2015-06-29 ENCOUNTER — Telehealth: Payer: Self-pay | Admitting: Hematology

## 2015-06-29 ENCOUNTER — Encounter (HOSPITAL_COMMUNITY): Payer: Self-pay

## 2015-06-29 ENCOUNTER — Encounter: Payer: Self-pay | Admitting: Hematology

## 2015-06-29 VITALS — BP 165/66 | HR 101 | Temp 97.8°F | Resp 18 | Ht 63.0 in | Wt 148.1 lb

## 2015-06-29 DIAGNOSIS — Z170 Estrogen receptor positive status [ER+]: Secondary | ICD-10-CM

## 2015-06-29 DIAGNOSIS — C50212 Malignant neoplasm of upper-inner quadrant of left female breast: Secondary | ICD-10-CM

## 2015-06-29 DIAGNOSIS — Z79899 Other long term (current) drug therapy: Secondary | ICD-10-CM | POA: Diagnosis not present

## 2015-06-29 DIAGNOSIS — G9050 Complex regional pain syndrome I, unspecified: Secondary | ICD-10-CM | POA: Diagnosis not present

## 2015-06-29 DIAGNOSIS — R5382 Chronic fatigue, unspecified: Secondary | ICD-10-CM | POA: Insufficient documentation

## 2015-06-29 DIAGNOSIS — Z853 Personal history of malignant neoplasm of breast: Secondary | ICD-10-CM | POA: Diagnosis not present

## 2015-06-29 DIAGNOSIS — M549 Dorsalgia, unspecified: Secondary | ICD-10-CM | POA: Diagnosis not present

## 2015-06-29 DIAGNOSIS — I10 Essential (primary) hypertension: Secondary | ICD-10-CM

## 2015-06-29 DIAGNOSIS — Z01812 Encounter for preprocedural laboratory examination: Secondary | ICD-10-CM | POA: Insufficient documentation

## 2015-06-29 DIAGNOSIS — M1990 Unspecified osteoarthritis, unspecified site: Secondary | ICD-10-CM

## 2015-06-29 DIAGNOSIS — M3500 Sicca syndrome, unspecified: Secondary | ICD-10-CM | POA: Insufficient documentation

## 2015-06-29 DIAGNOSIS — Z7982 Long term (current) use of aspirin: Secondary | ICD-10-CM | POA: Diagnosis not present

## 2015-06-29 DIAGNOSIS — Z01818 Encounter for other preprocedural examination: Secondary | ICD-10-CM | POA: Insufficient documentation

## 2015-06-29 DIAGNOSIS — G629 Polyneuropathy, unspecified: Secondary | ICD-10-CM | POA: Diagnosis not present

## 2015-06-29 DIAGNOSIS — F329 Major depressive disorder, single episode, unspecified: Secondary | ICD-10-CM | POA: Insufficient documentation

## 2015-06-29 DIAGNOSIS — Z86718 Personal history of other venous thrombosis and embolism: Secondary | ICD-10-CM | POA: Insufficient documentation

## 2015-06-29 DIAGNOSIS — E785 Hyperlipidemia, unspecified: Secondary | ICD-10-CM | POA: Insufficient documentation

## 2015-06-29 DIAGNOSIS — D0511 Intraductal carcinoma in situ of right breast: Secondary | ICD-10-CM

## 2015-06-29 DIAGNOSIS — Z171 Estrogen receptor negative status [ER-]: Secondary | ICD-10-CM

## 2015-06-29 DIAGNOSIS — F419 Anxiety disorder, unspecified: Secondary | ICD-10-CM | POA: Diagnosis not present

## 2015-06-29 DIAGNOSIS — C50912 Malignant neoplasm of unspecified site of left female breast: Secondary | ICD-10-CM | POA: Insufficient documentation

## 2015-06-29 DIAGNOSIS — M810 Age-related osteoporosis without current pathological fracture: Secondary | ICD-10-CM | POA: Insufficient documentation

## 2015-06-29 DIAGNOSIS — C50411 Malignant neoplasm of upper-outer quadrant of right female breast: Secondary | ICD-10-CM

## 2015-06-29 DIAGNOSIS — M797 Fibromyalgia: Secondary | ICD-10-CM | POA: Diagnosis not present

## 2015-06-29 LAB — CBC
HCT: 35.3 % — ABNORMAL LOW (ref 36.0–46.0)
Hemoglobin: 11 g/dL — ABNORMAL LOW (ref 12.0–15.0)
MCH: 27.9 pg (ref 26.0–34.0)
MCHC: 31.2 g/dL (ref 30.0–36.0)
MCV: 89.6 fL (ref 78.0–100.0)
Platelets: 261 10*3/uL (ref 150–400)
RBC: 3.94 MIL/uL (ref 3.87–5.11)
RDW: 15.3 % (ref 11.5–15.5)
WBC: 9.9 10*3/uL (ref 4.0–10.5)

## 2015-06-29 LAB — BASIC METABOLIC PANEL
Anion gap: 10 (ref 5–15)
BUN: 18 mg/dL (ref 6–20)
CO2: 23 mmol/L (ref 22–32)
Calcium: 10.2 mg/dL (ref 8.9–10.3)
Chloride: 106 mmol/L (ref 101–111)
Creatinine, Ser: 1.31 mg/dL — ABNORMAL HIGH (ref 0.44–1.00)
GFR calc Af Amer: 48 mL/min — ABNORMAL LOW (ref >60)
GFR calc non Af Amer: 41 mL/min — ABNORMAL LOW (ref >60)
Glucose, Bld: 115 mg/dL — ABNORMAL HIGH (ref 65–99)
Potassium: 4.2 mmol/L (ref 3.5–5.1)
Sodium: 139 mmol/L (ref 135–145)

## 2015-06-29 NOTE — Progress Notes (Signed)
[place] Follow up Note   Patient Care Team: Mayra Neer, MD as PCP - General (Family Medicine) Holley Bouche, NP as Nurse Practitioner (Nurse Practitioner) [name], MD as Consulting Physician (General Surgery) Truitt Merle, MD as Consulting Physician (Hematology) Sylvan Cheese, NP as Nurse Practitioner (Hematology and Oncology) Crissie Reese, MD as Consulting Physician (Plastic Surgery)  CHIEF COMPLAINTS:  Newly diagnosed left breast cancer   Oncology History   Breast cancer of upper-outer quadrant of right female breast [facility])   Staging form: Breast, AJCC 7th Edition     Clinical stage from 09/06/2014: Stage 0 (Tis (DCIS), N0, M0) - Signed by Truitt Merle, MD on 03/03/2015     Pathologic stage from 01/25/2015: Stage 0 (Tis (DCIS), N0, cM0) - Signed by Truitt Merle, MD on 03/03/2015        Breast cancer of upper-outer quadrant of right female breast ([place])   08/23/2014 Mammogram Mammogram and ultrasound showed a 3cm cystic lesion at 8:30 of right breast, and a 7 mm and a 5 mm increased density lesion at 10:00 of the right breast.   08/29/2014 Initial Biopsy Right breast core needle bx x 2: high-grade DCIS, with focus of mildly suspicious for early stromal invasion. ER- (0%), PR- (0%).    08/29/2014 Clinical Stage Stage 0: Tis N0   09/13/2014 Surgery Right lumpectomy/SLNB ([place]): High grade DCIS with necrosis and calcfications, positive margins. 5 LN removed and negative for malignancy (0/5). Grade 3.   09/13/2014 Pathologic Stage Stage 0: Tis N0   09/29/2014 Surgery Re-excision for positive margins; multiple breast margins re-excised, high-grade DCIS, measuring 1.0 cm, 3.0 cm, 2.0 cm, some margins are still positive or very close.    01/25/2015 Definitive Surgery Right breast mastectomy Barry Dienes): DCIS with calcifications, high grade, spans 9 cm, LCIS, fibrocystic changes with adenosis and calcifications, negative margins. 1 LN removed and negative.   03/23/2015  Survivorship Survivorship care plan completed and mailed to patient in lieu of in person visit at request of patient.    Breast cancer of upper-inner quadrant of left female breast ([place])   06/19/2015 Mammogram Diagnostic mammogram and ultrasound showed a 1.3 cm in the upper inner quadrant of left breast, and a 0.9 mm simple cyst. Axillary was negative.   06/20/2015 Receptors her2 Your 100% positive, PR negative, Ki-67 30%, HER-2 negative   06/20/2015 Initial Biopsy The left breast upper inner quadrant mass biopsy showed invasive ductal carcinoma, grade 2   06/20/2015 Initial Diagnosis Breast cancer of upper-inner quadrant of left female breast ([place])    HISTORY OF PRESENTING ILLNESS (09/06/2014):  Connie Bennett 65 y.o. female is here because of recent diagnosis of right breast DCIS  On her recent follow-up with her primary care physician, a lump in her right breat was found on exam, and she was sent for mammogram. Her diagnostic mammogram and ultrasound on 08/19/2014 showed a 3 cm cystic lesion, an additional 7 mm and a 5 mm increased density lesion at 10:00 of her right breast. She has right breast cysts before which was biopsied before. She underwent core needle biopsy on 08/29/2014, which showed high-grade DCIS. The aspiration of the cystic lesion showed no malignant cells on cytology.  She has chronic diffuse arthritis pain, she had a back surgery twice and has a stimulator for pain control. She is able to take care of herself, and does some light housework. She walks around with a cane. She denies any recent change of her energy level, appetite  or weight.  In terms of breast cancer risk profile:  She menarched at early age of 61 and went to menopause at age 44 She had 1 pregnancy, her first child was born at age 76  She did not breast-fed her child.  She received birth control pills for approximately 15 years.  She was never exposed to fertility medications, but use hormone replacement therapy  for 3-5 years  She has  family history of Breast/GYN/GI cancer  INTERIM HISTORY: Aneri returns for follow-up. She is accompanied to the clinic by her husband. She had a screening mammogram on 06/19/2015,  Which unfortunately showed a 1.3 cm mass in the upper inner quadrant of her left breast. The mass was biopsied on the next day, which showed invasive ductal carcinoma,  ER positive, PR negative, HER-2 negative. She was seen by Dr. Barry Dienes, and is scheduled for  Nipple sparing left mastectomy next week.  She feels well overall, except that she has been quite stressed due to the newly diagnosed second breast cancer.  MEDICAL HISTORY:  Past Medical History  Diagnosis Date  . Osteoporosis   . Mitral valve prolapse     MILD /   PER PT ASYMPTOMATIC  . Chronic fatigue syndrome   . Arthritis   . History of DVT of lower extremity     2008--  BILATERAL  . Hyperlipidemia     takes Zetia and Crestor  daily  . History of drug overdose     oxycontin  and oxycodone  Sept 2015--  now has narcan injection prescription  . Breast cancer [facility]) oncologist-  dr Truitt Merle--  right upper-outer quadrant     dx May 2016--- Stage 0  (Tis,N0,M0)  DCIS  Right breast---  09-13-2014  s/p  radioactive seed/ partial mastectomy / sln bx  . Fibromyalgia   . RSD (reflex sympathetic dystrophy)     left wrist and forearm from a fx  . Bilateral lower extremity edema     takes Furosemide daily as needed  . Sjogren's disease ([place])   . Chronic low back pain   . Anxiety     takes Xanax daily as needed  . Depression     takes Effexor daily  . HTN (hypertension)     takes Amlodipine and Ramipril daily  . Muscle spasm     takes Zanaflex daily as needed  . RSD (reflex sympathetic dystrophy)   . Family history of adverse reaction to anesthesia     mom hard to wake up  . History of bronchitis > 23yr ago  . Headache(784.0)     couple of times a week  . Vertigo     takes Meclizine daily   . Peripheral neuropathy (HCC)      takes Neurontin daily  . Joint pain   . History of hiatal hernia   . History of colon polyps     benign  . Heart murmur   . Peripheral vascular disease (HCC)     hx dvt's  . Complication of anesthesia     spinal cord stimulator- to be off for surgery    SURGICAL HISTORY: Past Surgical History  Procedure Laterality Date  . Foot surgery Bilateral 1996    [facility]  . Radioactive seed guided mastectomy with axillary sentinel lymph node biopsy Right 09/13/2014    Procedure: RADIOACTIVE SEED GUIDED PARTIAL MASTECTOMY WITH AXILLARY SENTINEL LYMPH NODE BIOPSY;  Surgeon: FStark Klein MD;  Location[place] [facility]  Service: General;  Laterality: Right;  . Posterior lumbar fusion  01-30-2009    L4--5 Laminectmy w/ decompression/  bilateral L4--5 microdiskectomy and fusion  . Right inferior parathyroidectomy  04-07-2006  . Spinal cord stimulator implant  2013  . Tonsillectomy  as child  . Lumbar disc surgery    . Right carpal tunnel release/  trigger release right middle finger  06-18-2006  . Orif left wrist fx's/  left carpal tunnel release  1999  . Re-excision of breast lumpectomy Right 09/29/2014    Procedure: RE-EXCISION OF BREAST LUMPECTOMY;  Surgeon: [name], MD;  Location: [place];  Service: General;  Laterality: Right;  . Nasal sinus surgery    . Inner ear surgery Right   . Colonoscopy    . Esophagogastroduodenoscopy    . Eye lid surgery Bilateral   . Mastectomy Right 01/25/2015    nipple sparing   . Simple mastectomy with axillary sentinel node biopsy Right 01/25/2015    Procedure: RIGHT NIPPLE SPARING MASTECTOMY;  Surgeon: [name], MD;  Location: [place];  Service: General;  Laterality: Right;  . Breast reconstruction with placement of tissue expander and flex hd (acellular hydrated dermis) Right 01/25/2015    Procedure: RIGHT BREAST RECONSTRUCTION WITH PLACEMENT OF IMPLANT AND ACELLULAR DERMAL MATRIX ;  Surgeon: Crissie Reese, MD;   Location: [place];  Service: Plastics;  Laterality: Right;    SOCIAL HISTORY: Social History   Social History  . Marital Status: Married    Spouse Name: N/A  . Number of Children: 1  . Years of Education: N/A   Occupational History  . unemployed     hair stylist  . disabled    Social History Main Topics  . Smoking status: Former Smoker -- 1.00 packs/day for 25 years    Types: Cigarettes    Quit date: 09/29/2014  . Smokeless tobacco: Never Used     Comment: quit smoking Jan 2016  . Alcohol Use: No  . Drug Use: No  . Sexual Activity: Yes    Birth Control/ Protection: Post-menopausal   Other Topics Concern  . Not on file   Social History Narrative    FAMILY HISTORY: Family History  Problem Relation Age of Onset  . Heart murmur Mother   . Cancer Mother 22    carcinoid tumor   . Heart murmur Sister     x2  . Cancer Maternal Aunt     lung cancer  . Cancer Paternal Aunt     breast cancer   . Cancer Maternal Aunt     gastric cancer   . Cancer Cousin     lung cancer  . Cancer Cousin     bone cancer   . Heart attack Maternal Uncle   . Heart attack Paternal Uncle   . Hypertension Mother   . Hypertension Son   . Stroke Neg Hx     ALLERGIES:  is allergic to carbocaine; penicillins; sulfa antibiotics; amlodipine; and latex.  MEDICATIONS:  Current Outpatient Prescriptions  Medication Sig Dispense Refill  . ALPRAZolam (XANAX) 0.5 MG tablet Take 0.5-1 mg by mouth 2 (two) times daily as needed for anxiety. Take 1 tablet (0.5 mg) by mouth if needed during the day, and 2 tablets (1 mg) if needed at bedtime    . calcium carbonate (OS-CAL) 600 MG TABS tablet Take 600 mg by mouth daily with breakfast.    . cyclobenzaprine (FLEXERIL) 10 MG tablet Take 10 mg by mouth 2 (two) times daily as needed for  muscle spasms.    Marland Kitchen ezetimibe (ZETIA) 10 MG tablet Take 10 mg by mouth at bedtime.     . gabapentin (NEURONTIN) 600 MG tablet Take 600 mg by mouth 3 (three) times daily.      Marland Kitchen  lidocaine (LIDODERM) 5 % Place 1 patch onto the skin daily as needed (pain). PRN   ----Remove & Discard patch within 12 hours or as directed by MD    . OVER THE COUNTER MEDICATION Take 1 drop by mouth daily as needed (dry eyes). OTC lubricating eye drops    . oxyCODONE-acetaminophen (PERCOCET) 10-325 MG tablet Take 1 tablet by mouth every 4 (four) hours as needed for pain.    . potassium chloride SA (K-DUR,KLOR-CON) 20 MEQ tablet Take 20 mEq by mouth daily as needed (with furosemide (lasix) dose).    . ramipril (ALTACE) 10 MG capsule Take 10 mg by mouth daily.    . rosuvastatin (CRESTOR) 20 MG tablet Take 20 mg by mouth daily.    Marland Kitchen tiZANidine (ZANAFLEX) 4 MG tablet Take 4-8 mg by mouth at bedtime as needed for muscle spasms. May take twice a day if needed    . venlafaxine (EFFEXOR-XR) 150 MG 24 hr capsule Take 150 mg by mouth 2 (two) times daily. Take 1 capsule (150 mg) by mouth daily with breakfast and lunch    . furosemide (LASIX) 40 MG tablet Take 40 mg by mouth daily as needed for fluid or edema (leg swelling). Reported on 06/29/2015  1  . meclizine (ANTIVERT) 25 MG tablet Take 25 mg by mouth 3 (three) times daily as needed for dizziness (vertigo). Reported on 06/29/2015     No current facility-administered medications for this visit.    REVIEW OF SYSTEMS:   Constitutional: Denies fevers, chills or abnormal night sweats Eyes: Denies blurriness of vision, double vision or watery eyes Ears, nose, mouth, throat, and face: Denies mucositis or sore throat Respiratory: Denies cough, dyspnea or wheezes Cardiovascular: Denies palpitation, chest discomfort or lower extremity swelling Gastrointestinal:  Denies nausea, heartburn or change in bowel habits Skin: Denies abnormal skin rashes Lymphatics: Denies new lymphadenopathy or easy bruising Neurological:Denies numbness, tingling or new weaknesses Behavioral/Psych: Mood is stable, no new changes  All other systems were reviewed with the patient and  are negative.  PHYSICAL EXAMINATION: ECOG PERFORMANCE STATUS: 1 - Symptomatic but completely ambulatory  Filed Vitals:   06/29/15 1332  BP: 165/66  Pulse: 101  Temp: 97.8 F (36.6 C)  Resp: 18   Filed Weights   06/29/15 1332  Weight: 148 lb 1.6 oz (67.178 kg)    GENERAL:alert, no distress and comfortable SKIN: skin color, texture, turgor are normal, no rashes or significant lesions EYES: normal, conjunctiva are pink and non-injected, sclera clear OROPHARYNX:no exudate, no erythema and lips, buccal mucosa, and tongue normal  NECK: supple, thyroid normal size, non-tender, without nodularity LYMPH:  no palpable lymphadenopathy in the cervical, axillary or inguinal LUNGS: clear to auscultation and percussion with normal breathing effort HEART: regular rate & rhythm and no murmurs and no lower extremity edema ABDOMEN:abdomen soft, non-tender and normal bowel sounds Musculoskeletal:no cyanosis of digits and no clubbing  PSYCH: alert & oriented x 3 with fluent speech NEURO: no focal motor/sensory deficits Breasts: s/p right mastectomy  And implant placement, surgical incisions has well-healed. (+)  There is a small (1cm)  Mass in the 12:00 position of the left breast, right after the biopsy site, with skin bruise,  Nontender. No other palpable breast mass or  axillary adenopathy.    LABORATORY DATA:  I have reviewed the data as listed Lab Results  Component Value Date   WBC 6.3 01/22/2015   HGB 10.9* 01/22/2015   HCT 34.0* 01/22/2015   MCV 92.1 01/22/2015   PLT 278 01/22/2015   Lab Results  Component Value Date   NA 139 01/26/2015   K 4.4 01/26/2015   CL 104 01/26/2015   CO2 24 01/26/2015   PATHOLOGY REPORT  Diagnosis 09/13/2014 1. Breast, lumpectomy, Right - HIGH GRADE DUCTAL CARCINOMA IN SITU WITH NECROSIS AND CALCIFICATIONS, SEE COMMENT. - TUMOR IS FOCALLY PRESENT AT LATERAL MARGIN. - TUMOR IS BROADLY PRESENT AT MEDIAL MARGIN. - TUMOR IS 1 MM FROM NEAREST ANTERIOR,  POSTERIOR, AND INFERIOR MARGIN. - PREVIOUS BIOPSY SITE. - SEE TUMOR SYNOPTIC TEMPLATE BELOW. 2. Lymph node, sentinel, biopsy, Right axilla #1 - ONE LYMPH NODE, NEGATIVE FOR TUMOR (0/1). 3. Lymph node, sentinel, biopsy, Right axilla #2 - ONE LYMPH NODE, NEGATIVE FOR TUMOR (0/1). 4. Lymph node, sentinel, biopsy, Right axilla #3 - ONE LYMPH NODE, NEGATIVE FOR TUMOR (0/1). 5. Lymph node, sentinel, biopsy, Right axilla #4 - ONE LYMPH NODE, NEGATIVE FOR TUMOR (0/1). 6. Lymph node, sentinel, biopsy, Right axilla #5 - ONE LYMPH NODE, NEGATIVE FOR TUMOR (0/1).  Diagnosis 09/29/2014 1. Breast, excision, new anterior margin (right) - FIBROCYSTIC CHANGES WITH CALCIFICATIONS. - BIOPSY SITE REACTION. - NO MALIGNANCY IDENTIFIED. 2. Breast, excision, new medial margin (right) - HIGH GRADE DUCTAL CARCINOMA IN SITU, 1.0 CM IN GREATEST DIMENSION. - DUCTAL CARCINOMA IN SITU FOCALLY INVOLVES LATERAL MARGIN. - BIOPSY SITE REACTION. 3. Breast, excision, new inferior margin (right) - HIGH GRADE DUCTAL CARCINOMA IN SITU, 3.0 CM IN GREATEST DIMENSION. - DUCTAL CARCINOMA IN SITU FOCALLY LESS THAN 0.1 CM FROM POSTERIOR, INFERIOR, AND LATERAL MARGINS. - BIOPSY SITE REACTION. - SEE ONCOLOGY TABLE BELOW. 4. Breast, excision, new posterior margin (right) - FIBROCYSTIC CHANGES WITH CALCIFICATIONS. - NO MALIGNANCY IDENTIFIED. - BIOPSY SITE REACTION. 5. Breast, excision, new lateral margin (right) - HIGH GRADE DUCTAL CARCINOMA IN SITU, 2.0 CM IN GREATEST DIMENSION. - DUCTAL CARCINOMA IN SITU FOCALLY INVOLVES LATERAL MARGIN. - DUCTAL CARCINOMA IN SITU FOCALLY LESS THAN 0.1 CM FROM POSTERIOR MARGIN.   Diagnosis Breast, simple mastectomy, right 01/25/2015  - DUCTAL CARCINOMA IN SITU WITH CALCIFICATIONS, HIGH GRADE, SPANNING 9.0 CM. - LOBULAR NEOPLASIA (LOBULAR CARCINOMA IN SITU) - FIBROCYSTIC CHANGES WITH ADENOSIS AND CALCIFICATIONS. - THE SURGICAL RESECTION MARGINS ARE NEGATIVE FOR DUCTAL CARCINOMA. - THERE IS  NO EVIDENCE OF CARCINOMA IN 1 OF 1 LYMPH NODE (0/1).  Diagnosis 06/20/2015  Breast, left, needle core biopsy - INVASIVE DUCTAL CARCINOMA WITH EXTRACELLULAR MUCIN. - SEE COMMENT. Microscopic Comment The carcinoma appears grade 2. A breast prognostic profile will be performed and the results reported separately. The results were called to [facility] on 06/21/15. (JBK:kh 06/21/15) Results: IMMUNOHISTOCHEMICAL AND MORPHOMETRIC ANALYSIS PERFORMED MANUALLY Estrogen Receptor: 100%, POSITIVE, STRONG STAINING INTENSITY Progesterone Receptor: 0%, NEGATIVE Proliferation Marker Ki67: 30%  Results: HER2 - NEGATIVE RATIO OF HER2/CEP17 SIGNALS 1.08 AVERAGE HER2 COPY NUMBER PER CELL 1.30   RADIOGRAPHIC STUDIES: I have personally reviewed the radiological images as listed and agreed with the findings in the report.  Bone density scan 06/28/2013  Osteopenia T-score -2.1   Diagnostic mammogram and ultrasound of the left breast and axilla 06/19/2015 Impression: Suspicious of malignancy The 1.3 cm oval solid mass with an indistinct margin in the upper inner quadrant posterior depth of the left breast is suspicious of malignancy. An ultrasound-guided  biopsy is recommended.  ASSESSMENT:  65 yo female   1.  Breast cancer of upper inner quadrant of left breast,  Invasive ductal carcinoma, cT1cN0M0, stage IA, G2, ER+/PR-/HER2- -  I reviewed her mammogram and ultrasound findings, and the biopsy results with her and her husband in great details - this appears to be elevated early-stage invasive breast cancer, HR+/HER2-,  With low Ki-67. - she is scheduled to have nipple sparing left mastectomy and reconstruction next week. - I recommend her  To have Oncotype Dx  Test to further evaluate her risk of recurrence after surgery , and the benefit of adjuvant chemotherapy.  She agrees with the plan - she probably would not need adjuvant radiation after mastectomy , if node negative - I strongly recommend  her to have a aromatase inhibitor as a adjuvant endocrine therapy after surgery, to reduce her risk of recurrence. The benefit and the potential side effects were discussed with her. She is interested  2. extensive right breast DCIS, >10cm, high grade ER/PR negative - this is cured by mastectomy, given the ER/PR negative status, I did not recommend chemotherapy prevention.  3 Bone health Her last DEXA scan in 2015 showed osteopenia. I encouraged her to continue calcium and vitamin D replacement  4. Arthritis, chronic back pain, hypertension -She will continue follow-up with her primary care physician   Follow up: I'll see her back in 4  Weeks to review her Oncotype DX test result.  All questions were answered. The patient knows to call the clinic with any problems, questions or concerns. I spent 30 minutes counseling the patient face to face. The total time spent in the appointment was 40 minutes and more than 50% was on counseling.     Truitt Merle, MD 06/29/2015 2:16 PM

## 2015-06-29 NOTE — Telephone Encounter (Signed)
Gave patient avs report and appointments for April and June. Patient aware June f/u will be addressed at April visit.

## 2015-06-29 NOTE — Pre-Procedure Instructions (Signed)
    Connie Bennett  06/29/2015      [facility] [address], [place] [address] Phone: [phone] Fax: [phone]  [id number] [place], [address] [place] [address] Phone: [phone] Fax: [phone]    Your procedure is scheduled on 07/05/15.  Report to [facility] Admitting at 830 A.M.  Call this number if you have problems the morning of surgery:  [phone]   Remember:  Do not eat food or drink liquids after midnight.  Take these medicines the morning of surgery with A SIP OF WATER --xanax,neurontin,percocet,effexor   Do not wear jewelry, make-up or nail polish.  Do not wear lotions, powders, or perfumes.  You may wear deodorant.  Do not shave 48 hours prior to surgery.  Men may shave face and neck.  Do not bring valuables to the hospital.  [facility] is not responsible for any belongings or valuables.  Contacts, dentures or bridgework may not be worn into surgery.  Leave your suitcase in the car.  After surgery it may be brought to your room.  For patients admitted to the hospital, discharge time will be determined by your treatment team.  Patients discharged the day of surgery will not be allowed to drive home.   Name and phone number of your driver:    Special instructions:    Please read over the following fact sheets that you were given. Pain Booklet, Coughing and Deep Breathing and Surgical Site Infection Prevention

## 2015-06-29 NOTE — Addendum Note (Signed)
Addended by: Jesse Fall on: 06/29/2015 02:38 PM   Modules accepted: Orders, Medications

## 2015-07-02 NOTE — Progress Notes (Signed)
Anesthesia Chart Review: Patient is a 65 year old female scheduled for left nipple sparing mastectomy with sentinal LN biopsy and blue dye injection (Dr. Barry Dienes) and left breat reconstruction with placement of tissue expander or silicone acellular dermal matrix (Dr. Harlow Mares) on 07/05/15.  History includes right breast cancer s/p right partial mastectomy 09/13/14 with re-excision right lumpectomy 09/29/14 and s/p nipple sparing right mastectomy with reconstruction 01/25/16 with left breast cancer diagnosed 06/20/15, Sjogren's disease, HTN, anxiety, depression, RSD, fibromyalgia, history of drug OD (oxycontin/oxycondone), HLD, bilateral DVT '08, chronic fatigue syndrome, osteoporosis, MVP/murmur (structurally normal MV with mild MR 11/2014 echo), hiatal hernia, peripheral neuropathy, vertigo, headaches, spinal cord stimulator '13, nasal sinus surgery, PLIF '10, tonsillectomy, right inferior parathyroidectomy. PCP is Dr. Mayra Neer. HEM-ONC is Dr. Truitt Merle.  She was seen by Tarri Fuller, PA-C with Dr. Marlou Porch on 12/15/14 for pre-operative cardiology evaluation due to EKG changes since 2008. Stress and echo done (see below).  Meds include Xanax, ASA 81 mg, Flexeril, Zetia, Lasix, Neurontin, Lidoderm, meclizine, Effexor XR, Zanaflex, Crestor, ramipril, K-dur, Percocet.  PAT Vitals: BP 179/86, HR 104, RR 18, O2 sat 96%, T 37.2C.  12/15/14 EKG: NSR. RBBB. RBBB is seen on 12/06/14 but is new since tracing in 2008.  12/18/14 Echo: Study Conclusions - Left ventricle: The cavity size was normal. There was mild concentric hypertrophy. Systolic function was normal. The  estimated ejection fraction was in the range of 60% to 65%. Wall motion was normal; there were no regional wall motion abnormalities. The study is not technically sufficient to allow evaluation of LV diastolic function. - Aortic valve: Trileaflet; normal thickness leaflets. There was no regurgitation. - Aortic root: The aortic root was normal in size. -  Ascending aorta: The ascending aorta was normal in size. - Mitral valve: Structurally normal valve. There was mild regurgitation. - Right ventricle: The cavity size was normal. Wall thickness was normal. Systolic function was normal. - Right atrium: The atrium was normal in size. - Tricuspid valve: There was trivial regurgitation. - Pulmonic valve: There was trivial regurgitation. - Pulmonary arteries: Systolic pressure was within the normal range. - Inferior vena cava: The vessel was normal in size. - Pericardium, extracardiac: There was no pericardial effusion.  12/18/14 Nuclear stress test:  Nuclear stress EF: 68%.  There was no ST segment deviation noted during stress.  The study is normal.  This is a low risk study. No ischemia. Reassuring, low risk study, no ischemia, normal EF 68%  Preoperative labs noted. Cr 1.31. Glucose 115. H/H 11.0/35.3.  If no acute changes then I anticipate that she can proceed as planned.  George Hugh [facility] Short Stay Center/Anesthesiology Phone [phone] 07/02/2015 5:12 PM

## 2015-07-05 ENCOUNTER — Ambulatory Visit (HOSPITAL_COMMUNITY): Payer: PPO | Admitting: Vascular Surgery

## 2015-07-05 ENCOUNTER — Ambulatory Visit (HOSPITAL_COMMUNITY)
Admission: RE | Admit: 2015-07-05 | Discharge: 2015-07-06 | Disposition: A | Discharge status: Home / Self Care | Payer: PPO | Source: Ambulatory Visit | Attending: General Surgery | Admitting: General Surgery

## 2015-07-05 ENCOUNTER — Ambulatory Visit (HOSPITAL_COMMUNITY): Payer: PPO | Admitting: Certified Registered"

## 2015-07-05 ENCOUNTER — Encounter (HOSPITAL_COMMUNITY)
Admission: RE | Disposition: A | Discharge status: Home / Self Care | Payer: Self-pay | Source: Ambulatory Visit | Attending: General Surgery

## 2015-07-05 ENCOUNTER — Ambulatory Visit (HOSPITAL_COMMUNITY)
Admission: RE | Admit: 2015-07-05 | Discharge: 2015-07-05 | Disposition: A | Discharge status: Home / Self Care | Payer: PPO | Source: Ambulatory Visit | Attending: General Surgery | Admitting: General Surgery

## 2015-07-05 ENCOUNTER — Encounter (HOSPITAL_COMMUNITY): Payer: Self-pay | Admitting: *Deleted

## 2015-07-05 DIAGNOSIS — M797 Fibromyalgia: Secondary | ICD-10-CM | POA: Insufficient documentation

## 2015-07-05 DIAGNOSIS — F419 Anxiety disorder, unspecified: Secondary | ICD-10-CM | POA: Diagnosis not present

## 2015-07-05 DIAGNOSIS — C50212 Malignant neoplasm of upper-inner quadrant of left female breast: Secondary | ICD-10-CM | POA: Diagnosis not present

## 2015-07-05 DIAGNOSIS — C50912 Malignant neoplasm of unspecified site of left female breast: Secondary | ICD-10-CM | POA: Diagnosis present

## 2015-07-05 DIAGNOSIS — Z87891 Personal history of nicotine dependence: Secondary | ICD-10-CM | POA: Diagnosis not present

## 2015-07-05 DIAGNOSIS — I959 Hypotension, unspecified: Secondary | ICD-10-CM | POA: Diagnosis not present

## 2015-07-05 DIAGNOSIS — Z7982 Long term (current) use of aspirin: Secondary | ICD-10-CM | POA: Diagnosis not present

## 2015-07-05 DIAGNOSIS — Z79899 Other long term (current) drug therapy: Secondary | ICD-10-CM | POA: Insufficient documentation

## 2015-07-05 DIAGNOSIS — C50412 Malignant neoplasm of upper-outer quadrant of left female breast: Secondary | ICD-10-CM | POA: Diagnosis not present

## 2015-07-05 DIAGNOSIS — M799 Soft tissue disorder, unspecified: Secondary | ICD-10-CM | POA: Diagnosis not present

## 2015-07-05 DIAGNOSIS — C50411 Malignant neoplasm of upper-outer quadrant of right female breast: Secondary | ICD-10-CM | POA: Diagnosis not present

## 2015-07-05 DIAGNOSIS — Z853 Personal history of malignant neoplasm of breast: Secondary | ICD-10-CM | POA: Insufficient documentation

## 2015-07-05 DIAGNOSIS — G4730 Sleep apnea, unspecified: Secondary | ICD-10-CM | POA: Insufficient documentation

## 2015-07-05 DIAGNOSIS — M545 Low back pain: Secondary | ICD-10-CM | POA: Diagnosis not present

## 2015-07-05 DIAGNOSIS — I10 Essential (primary) hypertension: Secondary | ICD-10-CM | POA: Diagnosis not present

## 2015-07-05 DIAGNOSIS — F329 Major depressive disorder, single episode, unspecified: Secondary | ICD-10-CM | POA: Diagnosis not present

## 2015-07-05 DIAGNOSIS — I739 Peripheral vascular disease, unspecified: Secondary | ICD-10-CM | POA: Diagnosis not present

## 2015-07-05 DIAGNOSIS — M1990 Unspecified osteoarthritis, unspecified site: Secondary | ICD-10-CM | POA: Diagnosis not present

## 2015-07-05 HISTORY — PX: NIPPLE SPARING MASTECTOMY/SENTINAL LYMPH NODE BIOPSY/RECONSTRUCTION/PLACEMENT OF TISSUE EXPANDER: SHX6484

## 2015-07-05 HISTORY — PX: BREAST RECONSTRUCTION WITH PLACEMENT OF TISSUE EXPANDER AND FLEX HD (ACELLULAR HYDRATED DERMIS): SHX6295

## 2015-07-05 SURGERY — NIPPLE SPARING MASTECTOMY WITH SENTINAL LYMPH NODE BIOPSY AND  RECONSTRUCTION WITH PLACEMENT OF TISSUE EXPANDER
Anesthesia: General | Site: Breast | Laterality: Left

## 2015-07-05 MED ORDER — ONDANSETRON HCL 4 MG/2ML IJ SOLN: 4.0000 mg | Freq: Once | INTRAMUSCULAR | Status: DC | PRN

## 2015-07-05 MED ORDER — FENTANYL CITRATE (PF) 250 MCG/5ML IJ SOLN
INTRAMUSCULAR | Status: AC
  Filled 2015-07-05: qty 5

## 2015-07-05 MED ORDER — ROCURONIUM BROMIDE 50 MG/5ML IV SOLN
INTRAVENOUS | Status: AC
  Filled 2015-07-05: qty 1

## 2015-07-05 MED ORDER — RAMIPRIL 10 MG PO CAPS
10.0000 mg | ORAL_CAPSULE | Freq: Every day | ORAL | Status: DC
  Filled 2015-07-05: qty 1

## 2015-07-05 MED ORDER — HYDROMORPHONE HCL 1 MG/ML IJ SOLN
INTRAMUSCULAR | Status: AC
  Filled 2015-07-05: qty 1

## 2015-07-05 MED ORDER — ROCURONIUM BROMIDE 100 MG/10ML IV SOLN
INTRAVENOUS | Status: DC | PRN
  Administered 2015-07-05: 30 mg via INTRAVENOUS
  Administered 2015-07-05: 20 mg via INTRAVENOUS
  Administered 2015-07-05 (×6): 10 mg via INTRAVENOUS

## 2015-07-05 MED ORDER — SUCCINYLCHOLINE CHLORIDE 20 MG/ML IJ SOLN
INTRAMUSCULAR | Status: AC
  Filled 2015-07-05: qty 1

## 2015-07-05 MED ORDER — PHENYLEPHRINE HCL 10 MG/ML IJ SOLN
10.0000 mg | INTRAVENOUS | Status: DC | PRN
  Administered 2015-07-05: 10 ug/min via INTRAVENOUS

## 2015-07-05 MED ORDER — MIDAZOLAM HCL 2 MG/2ML IJ SOLN
INTRAMUSCULAR | Status: AC
  Filled 2015-07-05: qty 2

## 2015-07-05 MED ORDER — FENTANYL CITRATE (PF) 100 MCG/2ML IJ SOLN
50.0000 ug | INTRAMUSCULAR | Status: AC | PRN
  Administered 2015-07-05: 100 ug via INTRAVENOUS
  Administered 2015-07-05: 150 ug via INTRAVENOUS
  Administered 2015-07-05: 100 ug via INTRAVENOUS
  Administered 2015-07-05 (×4): 50 ug via INTRAVENOUS

## 2015-07-05 MED ORDER — SUGAMMADEX SODIUM 200 MG/2ML IV SOLN
INTRAVENOUS | Status: DC | PRN
  Administered 2015-07-05: 150 mg via INTRAVENOUS

## 2015-07-05 MED ORDER — SODIUM CHLORIDE 0.9 % IR SOLN
Status: DC | PRN
  Administered 2015-07-05: 450 mL

## 2015-07-05 MED ORDER — DOCUSATE SODIUM 100 MG PO CAPS
100.0000 mg | ORAL_CAPSULE | Freq: Every day | ORAL | Status: DC
  Administered 2015-07-05 – 2015-07-06 (×2): 100 mg via ORAL
  Filled 2015-07-05 (×2): qty 1

## 2015-07-05 MED ORDER — SUGAMMADEX SODIUM 200 MG/2ML IV SOLN
INTRAVENOUS | Status: AC
  Filled 2015-07-05: qty 2

## 2015-07-05 MED ORDER — METHYLENE BLUE 0.5 % INJ SOLN
INTRAVENOUS | Status: AC
  Filled 2015-07-05: qty 10

## 2015-07-05 MED ORDER — OXYCODONE HCL 5 MG PO TABS
5.0000 mg | ORAL_TABLET | ORAL | Status: DC | PRN
  Administered 2015-07-05 – 2015-07-06 (×3): 5 mg via ORAL
  Filled 2015-07-05 (×3): qty 1

## 2015-07-05 MED ORDER — GLYCOPYRROLATE 0.2 MG/ML IJ SOLN
INTRAMUSCULAR | Status: DC | PRN
  Administered 2015-07-05: 0.2 mg via INTRAVENOUS

## 2015-07-05 MED ORDER — LIDOCAINE HCL (CARDIAC) 20 MG/ML IV SOLN
INTRAVENOUS | Status: AC
  Filled 2015-07-05: qty 5

## 2015-07-05 MED ORDER — CIPROFLOXACIN IN D5W 400 MG/200ML IV SOLN
INTRAVENOUS | Status: AC
  Administered 2015-07-05: 400 mg via INTRAVENOUS
  Filled 2015-07-05: qty 200

## 2015-07-05 MED ORDER — METHOCARBAMOL 500 MG PO TABS
500.0000 mg | ORAL_TABLET | Freq: Four times a day (QID) | ORAL | Status: DC | PRN
  Administered 2015-07-05: 500 mg via ORAL
  Filled 2015-07-05 (×2): qty 1

## 2015-07-05 MED ORDER — MIDAZOLAM HCL 5 MG/5ML IJ SOLN
INTRAMUSCULAR | Status: DC | PRN
  Administered 2015-07-05: 2 mg via INTRAVENOUS

## 2015-07-05 MED ORDER — SODIUM CHLORIDE 0.9 % IJ SOLN
INTRAMUSCULAR | Status: AC
Start: 2015-07-05 — End: 2015-07-05
  Filled 2015-07-05: qty 10

## 2015-07-05 MED ORDER — CALCIUM CARBONATE 1250 (500 CA) MG PO TABS
1.0000 | ORAL_TABLET | Freq: Every day | ORAL | Status: DC
  Administered 2015-07-06: 500 mg via ORAL
  Filled 2015-07-05 (×2): qty 1

## 2015-07-05 MED ORDER — DEXTROSE-NACL 5-0.45 % IV SOLN
INTRAVENOUS | Status: DC
  Administered 2015-07-05: 22:00:00 via INTRAVENOUS

## 2015-07-05 MED ORDER — LIDOCAINE HCL (CARDIAC) 20 MG/ML IV SOLN
INTRAVENOUS | Status: DC | PRN
  Administered 2015-07-05: 50 mg via INTRAVENOUS

## 2015-07-05 MED ORDER — POLYMYXIN B SULFATE 500000 UNITS IJ SOLR
INTRAMUSCULAR | Status: DC | PRN
  Administered 2015-07-05: 500 mL

## 2015-07-05 MED ORDER — DEXAMETHASONE SODIUM PHOSPHATE 10 MG/ML IJ SOLN
INTRAMUSCULAR | Status: DC | PRN
  Administered 2015-07-05: 10 mg via INTRAVENOUS

## 2015-07-05 MED ORDER — OXYCODONE-ACETAMINOPHEN 5-325 MG PO TABS
1.0000 | ORAL_TABLET | ORAL | Status: DC | PRN
  Administered 2015-07-05 – 2015-07-06 (×3): 1 via ORAL
  Filled 2015-07-05 (×3): qty 1

## 2015-07-05 MED ORDER — ONDANSETRON HCL 4 MG/2ML IJ SOLN
INTRAMUSCULAR | Status: DC | PRN
  Administered 2015-07-05: 4 mg via INTRAVENOUS

## 2015-07-05 MED ORDER — HYDROMORPHONE HCL 1 MG/ML IJ SOLN
0.5000 mg | INTRAMUSCULAR | Status: DC | PRN
Start: 2015-07-05 — End: 2015-07-06

## 2015-07-05 MED ORDER — HYDROMORPHONE HCL 1 MG/ML IJ SOLN
0.5000 mg | INTRAMUSCULAR | Status: DC | PRN
  Administered 2015-07-05: 0.5 mg via INTRAVENOUS

## 2015-07-05 MED ORDER — CIPROFLOXACIN IN D5W 400 MG/200ML IV SOLN: 400.0000 mg | INTRAVENOUS | Status: DC

## 2015-07-05 MED ORDER — FENTANYL CITRATE (PF) 100 MCG/2ML IJ SOLN
INTRAMUSCULAR | Status: AC
  Administered 2015-07-05: 50 ug via INTRAVENOUS
  Filled 2015-07-05: qty 2

## 2015-07-05 MED ORDER — GABAPENTIN 600 MG PO TABS
600.0000 mg | ORAL_TABLET | Freq: Three times a day (TID) | ORAL | Status: DC
  Administered 2015-07-05 – 2015-07-06 (×2): 600 mg via ORAL
  Filled 2015-07-05 (×2): qty 1

## 2015-07-05 MED ORDER — LACTATED RINGERS IV SOLN
INTRAVENOUS | Status: DC
  Administered 2015-07-05 (×2): via INTRAVENOUS

## 2015-07-05 MED ORDER — SODIUM CHLORIDE 0.9 % IJ SOLN
INTRAMUSCULAR | Status: DC | PRN
  Administered 2015-07-05 (×6): 10 mL

## 2015-07-05 MED ORDER — OXYCODONE-ACETAMINOPHEN 10-325 MG PO TABS: 1.0000 | ORAL_TABLET | ORAL | Status: DC | PRN

## 2015-07-05 MED ORDER — VANCOMYCIN HCL IN DEXTROSE 1-5 GM/200ML-% IV SOLN
1000.0000 mg | INTRAVENOUS | Status: DC
  Filled 2015-07-05: qty 200

## 2015-07-05 MED ORDER — PROPOFOL 10 MG/ML IV BOLUS
INTRAVENOUS | Status: DC | PRN
  Administered 2015-07-05: 200 mg via INTRAVENOUS

## 2015-07-05 MED ORDER — POTASSIUM CHLORIDE CRYS ER 20 MEQ PO TBCR: 20.0000 meq | EXTENDED_RELEASE_TABLET | Freq: Every day | ORAL | Status: DC | PRN

## 2015-07-05 MED ORDER — MECLIZINE HCL 25 MG PO TABS: 25.0000 mg | ORAL_TABLET | Freq: Three times a day (TID) | ORAL | Status: DC | PRN

## 2015-07-05 MED ORDER — LIDOCAINE HCL 4 % EX SOLN
CUTANEOUS | Status: DC | PRN
  Administered 2015-07-05: 4 mL via TOPICAL

## 2015-07-05 MED ORDER — EZETIMIBE 10 MG PO TABS
10.0000 mg | ORAL_TABLET | Freq: Every day | ORAL | Status: DC
  Administered 2015-07-05: 10 mg via ORAL
  Filled 2015-07-05: qty 1

## 2015-07-05 MED ORDER — ROSUVASTATIN CALCIUM 20 MG PO TABS
20.0000 mg | ORAL_TABLET | Freq: Every day | ORAL | Status: DC
  Administered 2015-07-05: 20 mg via ORAL
  Filled 2015-07-05 (×2): qty 1

## 2015-07-05 MED ORDER — ONDANSETRON HCL 4 MG/2ML IJ SOLN: 4.0000 mg | Freq: Four times a day (QID) | INTRAMUSCULAR | Status: DC | PRN

## 2015-07-05 MED ORDER — VENLAFAXINE HCL ER 75 MG PO CP24
150.0000 mg | ORAL_CAPSULE | Freq: Two times a day (BID) | ORAL | Status: DC
  Administered 2015-07-05 – 2015-07-06 (×2): 150 mg via ORAL
  Filled 2015-07-05 (×2): qty 2

## 2015-07-05 MED ORDER — ENOXAPARIN SODIUM 40 MG/0.4ML ~~LOC~~ SOLN
40.0000 mg | SUBCUTANEOUS | Status: DC
  Administered 2015-07-06: 40 mg via SUBCUTANEOUS
  Filled 2015-07-05: qty 0.4

## 2015-07-05 MED ORDER — PHENYLEPHRINE HCL 10 MG/ML IJ SOLN
INTRAMUSCULAR | Status: DC | PRN
  Administered 2015-07-05: 40 ug via INTRAVENOUS
  Administered 2015-07-05: 80 ug via INTRAVENOUS

## 2015-07-05 MED ORDER — LIDOCAINE HCL 4 % EX SOLN: CUTANEOUS | Status: DC | PRN

## 2015-07-05 MED ORDER — VANCOMYCIN HCL IN DEXTROSE 1-5 GM/200ML-% IV SOLN
1000.0000 mg | INTRAVENOUS | Status: AC
  Administered 2015-07-05: 1000 mg via INTRAVENOUS
  Filled 2015-07-05: qty 200

## 2015-07-05 MED ORDER — PROPOFOL 10 MG/ML IV BOLUS
INTRAVENOUS | Status: AC
  Filled 2015-07-05: qty 20

## 2015-07-05 MED ORDER — FUROSEMIDE 40 MG PO TABS: 40.0000 mg | ORAL_TABLET | Freq: Every day | ORAL | Status: DC | PRN

## 2015-07-05 MED ORDER — TECHNETIUM TC 99M SULFUR COLLOID FILTERED
1.0000 | Freq: Once | INTRAVENOUS | Status: AC | PRN
  Administered 2015-07-05: 1 via INTRADERMAL

## 2015-07-05 MED ORDER — DEXAMETHASONE SODIUM PHOSPHATE 10 MG/ML IJ SOLN
INTRAMUSCULAR | Status: AC
  Filled 2015-07-05: qty 1

## 2015-07-05 MED ORDER — 0.9 % SODIUM CHLORIDE (POUR BTL) OPTIME
TOPICAL | Status: DC | PRN
  Administered 2015-07-05 (×3): 1000 mL

## 2015-07-05 MED ORDER — METHOCARBAMOL 500 MG PO TABS
ORAL_TABLET | ORAL | Status: AC
  Filled 2015-07-05: qty 1

## 2015-07-05 MED ORDER — ONDANSETRON HCL 4 MG/2ML IJ SOLN
INTRAMUSCULAR | Status: AC
  Filled 2015-07-05: qty 2

## 2015-07-05 MED ORDER — ALPRAZOLAM 0.5 MG PO TABS
0.5000 mg | ORAL_TABLET | Freq: Two times a day (BID) | ORAL | Status: DC | PRN
  Administered 2015-07-06: 0.5 mg via ORAL
  Filled 2015-07-05: qty 1

## 2015-07-05 SURGICAL SUPPLY — 87 items
ADH SKN CLS APL DERMABOND .7 (GAUZE/BANDAGES/DRESSINGS) ×1
APPLIER CLIP 9.375 MED OPEN (MISCELLANEOUS)
APR CLP MED 9.3 20 MLT OPN (MISCELLANEOUS)
ATCH SMKEVC FLXB CAUT HNDSWH (FILTER) ×2 IMPLANT
BAG DECANTER FOR FLEXI CONT (MISCELLANEOUS) ×2 IMPLANT
BINDER BREAST LRG (GAUZE/BANDAGES/DRESSINGS) IMPLANT
BINDER BREAST XLRG (GAUZE/BANDAGES/DRESSINGS) ×1 IMPLANT
BIOPATCH RED 1 DISK 7.0 (GAUZE/BANDAGES/DRESSINGS) ×4 IMPLANT
BLADE 10 SAFETY STRL DISP (BLADE) ×2 IMPLANT
CANISTER SUCTION 2500CC (MISCELLANEOUS) ×5 IMPLANT
CHLORAPREP W/TINT 26ML (MISCELLANEOUS) ×3 IMPLANT
CLIP APPLIE 9.375 MED OPEN (MISCELLANEOUS) IMPLANT
CLIP TI MEDIUM 24 (CLIP) ×2 IMPLANT
CLIP TI WIDE RED SMALL 24 (CLIP) ×2 IMPLANT
CONT SPEC 4OZ CLIKSEAL STRL BL (MISCELLANEOUS) ×4 IMPLANT
COVER PROBE W GEL 5X96 (DRAPES) ×1 IMPLANT
COVER SURGICAL LIGHT HANDLE (MISCELLANEOUS) ×4 IMPLANT
DERMABOND ADVANCED (GAUZE/BANDAGES/DRESSINGS) ×1
DERMABOND ADVANCED .7 DNX12 (GAUZE/BANDAGES/DRESSINGS) ×1 IMPLANT
DRAIN CHANNEL 19F RND (DRAIN) ×4 IMPLANT
DRAPE ORTHO SPLIT 77X108 STRL (DRAPES) ×4
DRAPE PROXIMA HALF (DRAPES) ×6 IMPLANT
DRAPE SURG 17X23 STRL (DRAPES) ×2 IMPLANT
DRAPE SURG ORHT 6 SPLT 77X108 (DRAPES) ×2 IMPLANT
DRAPE UTILITY XL STRL (DRAPES) ×2 IMPLANT
DRAPE WARM FLUID 44X44 (DRAPE) ×2 IMPLANT
DRSG PAD ABDOMINAL 8X10 ST (GAUZE/BANDAGES/DRESSINGS) ×3 IMPLANT
DRSG SORBAVIEW 3.5X5-5/16 MED (GAUZE/BANDAGES/DRESSINGS) ×4 IMPLANT
ELECT BLADE 6.5 EXT (BLADE) ×1 IMPLANT
ELECT CAUTERY BLADE 6.4 (BLADE) ×4 IMPLANT
ELECT REM PT RETURN 9FT ADLT (ELECTROSURGICAL) ×2
ELECTRODE REM PT RTRN 9FT ADLT (ELECTROSURGICAL) ×3 IMPLANT
EVACUATOR SILICONE 100CC (DRAIN) ×4 IMPLANT
EVACUATOR SMOKE ACCUVAC VALLEY (FILTER) ×1
GAUZE SPONGE 4X4 12PLY STRL (GAUZE/BANDAGES/DRESSINGS) ×1 IMPLANT
GLOVE BIO SURGEON STRL SZ 6 (GLOVE) ×1 IMPLANT
GLOVE BIO SURGEON STRL SZ7.5 (GLOVE) ×1 IMPLANT
GLOVE BIOGEL PI IND STRL 6.5 (GLOVE) ×1 IMPLANT
GLOVE BIOGEL PI IND STRL 7.0 (GLOVE) IMPLANT
GLOVE BIOGEL PI IND STRL 7.5 (GLOVE) IMPLANT
GLOVE BIOGEL PI IND STRL 8 (GLOVE) ×1 IMPLANT
GLOVE BIOGEL PI IND STRL 8.5 (GLOVE) IMPLANT
GLOVE BIOGEL PI INDICATOR 6.5 (GLOVE) ×4
GLOVE BIOGEL PI INDICATOR 7.0 (GLOVE) ×5
GLOVE BIOGEL PI INDICATOR 7.5 (GLOVE) ×3
GLOVE BIOGEL PI INDICATOR 8 (GLOVE) ×1
GLOVE BIOGEL PI INDICATOR 8.5 (GLOVE) ×1
GLOVE SURG SS PI 6.5 STRL IVOR (GLOVE) ×5 IMPLANT
GLOVE SURG SS PI 7.0 STRL IVOR (GLOVE) ×1 IMPLANT
GLOVE SURG SS PI 7.5 STRL IVOR (GLOVE) ×2 IMPLANT
GLOVE SURG SS PI 8.0 STRL IVOR (GLOVE) ×1 IMPLANT
GOWN STRL REUS W/ TWL LRG LVL3 (GOWN DISPOSABLE) ×2 IMPLANT
GOWN STRL REUS W/ TWL XL LVL3 (GOWN DISPOSABLE) ×1 IMPLANT
GOWN STRL REUS W/TWL 2XL LVL3 (GOWN DISPOSABLE) ×2 IMPLANT
GOWN STRL REUS W/TWL LRG LVL3 (GOWN DISPOSABLE) ×14
GOWN STRL REUS W/TWL XL LVL3 (GOWN DISPOSABLE) ×4
GRAFT FLEX HD 8X16 THICK (Tissue Mesh) ×1 IMPLANT
ILLUMINATOR WAVEGUIDE N/F (MISCELLANEOUS) ×1 IMPLANT
IMPL BREAST SALINE HP 380CC (Breast) IMPLANT
IMPLANT BREAST SALINE HP 380CC (Breast) ×2 IMPLANT
KIT BASIN OR (CUSTOM PROCEDURE TRAY) ×4 IMPLANT
KIT MARKER MARGIN INK (KITS) ×1 IMPLANT
KIT ROOM TURNOVER OR (KITS) ×4 IMPLANT
LIGHT WAVEGUIDE WIDE FLAT (MISCELLANEOUS) IMPLANT
MARKER SKIN DUAL TIP RULER LAB (MISCELLANEOUS) ×2 IMPLANT
NS IRRIG 1000ML POUR BTL (IV SOLUTION) ×5 IMPLANT
PACK GENERAL/GYN (CUSTOM PROCEDURE TRAY) ×4 IMPLANT
PAD ABD 8X10 STRL (GAUZE/BANDAGES/DRESSINGS) ×2 IMPLANT
PAD ARMBOARD 7.5X6 YLW CONV (MISCELLANEOUS) ×4 IMPLANT
PREFILTER EVAC NS 1 1/3-3/8IN (MISCELLANEOUS) ×2 IMPLANT
PREFILTER SMOKE EVAC (FILTER) ×2 IMPLANT
SPONGE LAP 18X18 X RAY DECT (DISPOSABLE) ×4 IMPLANT
STAPLER VISISTAT 35W (STAPLE) ×1 IMPLANT
SUT ETHILON 2 0 FS 18 (SUTURE) ×2 IMPLANT
SUT MNCRL AB 3-0 PS2 18 (SUTURE) ×6 IMPLANT
SUT MON AB 4-0 PC3 18 (SUTURE) ×1 IMPLANT
SUT PDS AB 3-0 SH 27 (SUTURE) ×4 IMPLANT
SUT PROLENE 3 0 PS 2 (SUTURE) ×5 IMPLANT
SUT SILK 2 0 SH (SUTURE) ×1 IMPLANT
SUT VIC AB 3-0 SH 18 (SUTURE) ×2 IMPLANT
SUT VIC AB 3-0 SH 8-18 (SUTURE) ×2 IMPLANT
SYR BULB IRRIGATION 50ML (SYRINGE) ×1 IMPLANT
TOWEL OR 17X24 6PK STRL BLUE (TOWEL DISPOSABLE) ×3 IMPLANT
TOWEL OR 17X26 10 PK STRL BLUE (TOWEL DISPOSABLE) ×4 IMPLANT
TRAY FOLEY BAG SILVER LF 16FR (SET/KITS/TRAYS/PACK) ×1 IMPLANT
TRAY FOLEY CATH 16FR SILVER (SET/KITS/TRAYS/PACK) IMPLANT
TUBE CONNECTING 12X1/4 (SUCTIONS) ×3 IMPLANT

## 2015-07-05 NOTE — Transfer of Care (Signed)
Immediate Anesthesia Transfer of Care Note  Patient: Connie Bennett  Procedure(s) Performed: Procedure(s): LEFT NIPPLE SPARING MASTECTOMY WITH SENTINAL LYMPH NODE BIOPSY  (Left) LEFT BREAST RECONSTRUCTION WITH PLACEMENT OF TISSUE EXPANDER AND  ACELLULAR DERMAL MATRIX  (Left)  Patient Location: PACU  Anesthesia Type:General  Level of Consciousness: awake, alert , oriented and patient cooperative  Airway & Oxygen Therapy: Patient Spontanous Breathing and Patient connected to nasal cannula oxygen  Post-op Assessment: Report given to RN, Post -op Vital signs reviewed and stable, Patient moving all extremities and Patient moving all extremities X 4  Post vital signs: Reviewed and stable  Last Vitals:  Filed Vitals:   07/05/15 0911 07/05/15 1025  BP: 104/74 117/34  Pulse: 68 60  Temp: 36.8 C   Resp: 18 13    Complications: No apparent anesthesia complications

## 2015-07-05 NOTE — Anesthesia Procedure Notes (Signed)
Procedure Name: Intubation Date/Time: 07/05/2015 10:50 AM Performed by: Lavell Luster Pre-anesthesia Checklist: Patient identified, Emergency Drugs available, Suction available, Patient being monitored and Timeout performed Patient Re-evaluated:Patient Re-evaluated prior to inductionOxygen Delivery Method: Circle system utilized Preoxygenation: Pre-oxygenation with 100% oxygen Intubation Type: IV induction Ventilation: Mask ventilation without difficulty Laryngoscope Size: Mac and 3 Grade View: Grade III Tube type: Oral Tube size: 7.5 mm Number of attempts: 1 Airway Equipment and Method: Stylet and LTA kit utilized Placement Confirmation: ETT inserted through vocal cords under direct vision,  positive ETCO2 and breath sounds checked- equal and bilateral Secured at: 22 cm Tube secured with: Tape Dental Injury: Teeth and Oropharynx as per pre-operative assessment

## 2015-07-05 NOTE — H&P (Signed)
Connie Bennett. [place] 06/25/2015 9:30 AM Location: [facility] Patient #: [id number] DOB: [birth year] Married / Language: English / Race: White Female   History of Present Illness Connie Bennett; 06/25/2015 10:41 AM) The patient is a 65 year old female who presents with breast cancer. Previous history Connie Bennett is a 65 yo F referred by Dr. Brigitte Pulse for consultation regarding a palpable right breast mass and a new diagnosis of cancer. She also had undergone a fall. She underwent diagnostic mammography and ultrasound of the affected breast. She had a cyst located at 8:30 in the right breast that was aspirated. She had two areas in the upper outer right breast. One was 7 mm, and the other was 5 mm. They were very close together, but appeared to be separate. Clips were 1.5 cm apart on clip mammogram. Both areas were biopsied, and they showed similar high grade DCIS with suspicion for early microinvasion. The DCIS was hormone receptor negative.  She has no family history of breast cancer. Her mother had a rare type of liver cancer. She had menarche at age 67. Menopause was in the early 69s. She is a G1P1 with first child at age 31-20. She did use oral contraceptives.   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 She also has had spine fusion and a repair of ruptured disk. She is a previous smoker. ]  Pt is s/p right seed localized lumpectomy and SLN bx 09/13/2014 followed by takeback for margins 09/29/2014. Unfortunately, her margins demonstrated extensive additional DCIS which was not apparent on her mammogram/ultrasound.   The patient underwent a right nipple sparing mastectomy with direct implant reconstruction by Dr. Harlow Mares on 01/25/2015. She had extensive additional DCIS. She had 9 cm of additional disease. She had an additional lymph node removed at the time of surgery and this was also negative.   Unfortunately, the patient returns with a new  diagnosis of left breast cancer. She was scheduled to undergo left mastopexy tomorrow. Prior to doing this, Dr. Harlow Mares ordered a mammogram and ultrasound. The ultrasound demonstrated a 1.4 cm solid mass with indistinct margins in the upper inner quadrant at its approximately 10:30 position. Biopsy was positive for invasive ductal carcinoma with extracellular mucin. This was grade 2. Breast prognostic profile is still pending. The patient is as anticipated emotionally having a very difficult time with this. Her surgery tomorrow has been canceled.    Allergies Elbert Ewings, CMA; 06/25/2015 9:31 AM) Carbocaine *LOCAL ANESTHETICS-Parenteral* Latex Gloves *MEDICAL DEVICES AND SUPPLIES* Penicillin G Sodium *PENICILLINS* SulfADIAZINE *Sulfonamides  Medication History Elbert Ewings, CMA; 06/25/2015 9:31 AM) Klor-Con M20 [facility] Tablet ER, Oral) Active. Ramipril (10MG Capsule, Oral) Active. TiZANidine HCl (4MG Tablet, Oral as needed) Active. Venlafaxine HCl ER (150MG Capsule ER 24HR, Oral) Active. Vitamin C (1000MG Tablet, Oral) Active. Aspirin (81MG Tablet Chewable, Oral) Active. Zetia (10MG Tablet, Oral) Active. Crestor (20MG Tablet, Oral) Active. Narcan (4MG/0.1ML Liquid, Nasal) Active. Centrum Adults (Oral) Active. Oxycodone-Acetaminophen (10-325MG Tablet, Oral as needed) Active. ALPRAZolam (0.5MG Tablet, Oral as needed) Active. AmLODIPine Besylate (5MG Tablet, Oral) Active. Furosemide (40MG Tablet, Oral) Active. Gabapentin (600MG Tablet, Oral) Active. Medications Reconciled    Review of Systems Connie Bennett; 06/25/2015 10:44 AM) All other systems negative  Vitals Elbert Ewings CMA; 06/25/2015 9:31 AM) 06/25/2015 9:31 AM Weight: 148.6 lb Height: 62.5in Body Surface Area: 1.69 m Body Mass Index: 26.75 kg/m  Temp.: 97.78F  Pulse: 103 (Regular)  BP: 130/70 (Sitting, Left Arm, Standard)  Physical Exam Connie Bennett; 06/25/2015 10:44  AM) General Mental Status-Alert. General Appearance-Consistent with stated age. Hydration-Well hydrated. Voice-Normal.  Head and Neck Head-normocephalic, atraumatic with no lesions or palpable masses.  Chest and Lung Exam Chest and lung exam reveals -quiet, even and easy respiratory effort with no use of accessory muscles. Inspection Chest Wall - Normal. Back - normal.  Breast Note: The implant is in place on right. No nodules on chest wall. No lymphadenopathy. Left breast without masses, skin dimpling, nipple retraction. Breasts are slightly ptotic, but left breast is relatively dense for age. Bruising at biopsy site on left in upper inner quadrant.   Cardiovascular Cardiovascular examination reveals -normal pedal pulses bilaterally. Note: regular rate and rhythm  Abdomen Inspection-Inspection Normal. Palpation/Percussion Palpation and Percussion of the abdomen reveal - Soft, Non Tender, No Rebound tenderness, No Rigidity (guarding) and No hepatosplenomegaly.  Lymphatic Head & Neck  General Head & Neck Lymphatics: Bilateral - Description - Normal. Axillary  General Axillary Region: Bilateral - Description - Normal. Tenderness - Non Tender.    Assessment & Plan Connie Bennett; 06/25/2015 10:45 AM) CARCINOMA OF UPPER-INNER QUADRANT OF LEFT FEMALE BREAST (C50.212) Impression: Based on patient's prior experience with lumpectomy and multiple re-excisions along with the fact that this does not show up on mammogram, the patient would like to undergo mastectomy. We will perform a left mastectomy in nipple sparing fashion with sentinel lymph node mapping and biopsy. Discussed the risk she may need radiation after surgery. Reviewed the risks of bleeding and infection. The patient has maintained her nonsmoking status since before her surgery last year.  I discussed this patient with Dr. Harlow Mares and he is in agreement. We are still waiting for her receptors in  order to find out the likelihood of needing chemotherapy. If she is triple negative for HER-2 positive, I suspect that chemotherapy will be recommended. However, Dr. Burr Medico may want to wait on final pathology. She also may want to do Oncotype.  The patient is in agreement with this plan and we will schedule this at the first available opportunity.  45 min spent in evaluation, examination, counseling, and coordination of care. >50% spent in counseling. Current Plans You are being scheduled for surgery - Our schedulers will call you.  You should hear from our office's scheduling department within 5 working days about the location, date, and time of surgery. We try to make accommodations for patient's preferences in scheduling surgery, but sometimes the OR schedule or the surgeon's schedule prevents [place] from making those accommodations.  If you have not heard from our office [phone]) in 5 working days, call the office and ask for your surgeon's nurse.  If you have other questions about your diagnosis, plan, or surgery, call the office and ask for your surgeon's nurse.  Referred to Oncology, for evaluation and follow up (Oncology). Routine. Referred to Surgery - Plastic, for evaluation and follow up (Plastic Surgery). Routine. Pt Education - CCS Mastectomy HCI   Signed by Connie Klein, Bennett (06/25/2015 10:46 AM)

## 2015-07-05 NOTE — Progress Notes (Signed)
Pharmacy Antibiotic Note  Connie Bennett is a 65 y.o. female admitted on 07/05/2015 with surgical prophylaxis.  Pharmacy has been consulted for vancomycin dosing.  She is s/p left mastectomy today. Pre-op vancomycin 1g IV x1 given at 1000 this morning.  Plan: Vancomycin 1g IV every 24 hours.  Goal trough 15-20 mcg/mL.  Height: 5\' 3"  (160 cm) Weight: 148 lb 2 oz (67.189 kg) IBW/kg (Calculated) : 52.4  Temp (24hrs), Avg:98.3 F (36.8 C), Min:98.1 F (36.7 C), Max:98.5 F (36.9 C)   Recent Labs Lab 06/29/15 1539  WBC 9.9  CREATININE 1.31*    Estimated Creatinine Clearance: 38.9 mL/min (by C-G formula based on Cr of 1.31).    Allergies  Allergen Reactions  . Carbocaine [Mepivacaine Hcl] Hives and Swelling  . Penicillins Nausea And Vomiting, Swelling and Rash    Has patient had a PCN reaction causing immediate rash, facial/tongue/throat swelling, SOB or lightheadedness with hypotension: Yes Has patient had a PCN reaction causing severe rash involving mucus membranes or skin necrosis: No Has patient had a PCN reaction that required hospitalization No Has patient had a PCN reaction occurring within the last 10 years: Yes If all of the above answers are "NO", then may proceed with Cephalosporin use.  . Sulfa Antibiotics Nausea And Vomiting, Swelling, Rash and Other (See Comments)    Headache   . Amlodipine Other (See Comments)    Unspecified  . Latex Rash    Reaction to gloves and bandaids (paper tape is ok)    Antimicrobials this admission: Cipro 4/6 x1 Vancomycin 4/6>>  Dose adjustments this admission: n/a  Microbiology results: n/a  Thank you for allowing pharmacy to be a part of this patient's care.  Mannat Benedetti D. Vergia Chea, PharmD, BCPS Clinical Pharmacist Pager: [phone] 07/05/2015 6:44 PM

## 2015-07-05 NOTE — Anesthesia Preprocedure Evaluation (Addendum)
Anesthesia Evaluation  Patient identified by MRN, date of birth, ID band Patient awake    Reviewed: Allergy & Precautions, NPO status , Patient's Chart, lab work & pertinent test results  Airway Mallampati: II  TM Distance: >3 FB Neck ROM: Full    Dental   Pulmonary sleep apnea , former smoker,    Pulmonary exam normal        Cardiovascular hypertension, + Peripheral Vascular Disease  Normal cardiovascular exam+ dysrhythmias + Valvular Problems/Murmurs MVP      Neuro/Psych  Headaches, Anxiety Depression  Neuromuscular disease    GI/Hepatic negative GI ROS, Neg liver ROS, hiatal hernia,   Endo/Other  negative endocrine ROS  Renal/GU negative Renal ROS     Musculoskeletal negative musculoskeletal ROS (+) Arthritis , Fibromyalgia -  Abdominal   Peds  Hematology negative hematology ROS (+)   Anesthesia Other Findings   Reproductive/Obstetrics                          Anesthesia Physical Anesthesia Plan  ASA: II  Anesthesia Plan: General   Post-op Pain Management:    Induction: Intravenous  Airway Management Planned: Oral ETT  Additional Equipment:   Intra-op Plan:   Post-operative Plan: Extubation in OR  Informed Consent: I have reviewed the patients History and Physical, chart, labs and discussed the procedure including the risks, benefits and alternatives for the proposed anesthesia with the patient or authorized representative who has indicated his/her understanding and acceptance.     Plan Discussed with: CRNA, Anesthesiologist and Surgeon  Anesthesia Plan Comments:         Anesthesia Quick Evaluation

## 2015-07-05 NOTE — Op Note (Signed)
Left Nipple Sparing Mastectomy with Sentinel Node Biopsy Procedure Note  Indications: This patient presents with history of right breast cancer, and now LEFT breast cancer with clinically negative axillary lymph node exam.  Pre-operative Diagnosis: left breast cancer, [id number]  Post-operative Diagnosis: left breast cancer, same  Surgeon: [name]   Anesthesia: General endotracheal anesthesia and Local anesthesia 0.25.% bupivacaine, with epinephrine  ASA Class: 2  Procedure Details  The patient was seen in the Holding Room. The risks, benefits, complications, treatment options, and expected outcomes were discussed with the patient. The possibilities of reaction to medication, pulmonary aspiration, bleeding, infection, the need for additional procedures, failure to diagnose a condition, and creating a complication requiring transfusion or operation were discussed with the patient. The patient concurred with the proposed plan, giving informed consent.  The site of surgery properly noted/marked. The patient was taken to Operating Room # 2, identified as Connie Bennett, and the procedure verified as Left nipple sparing Mastectomy and Sentinel Node Biopsy. A Time Out was held and the above information confirmed.     After induction of anesthesia, the bilateral breast and chest were prepped and draped in standard fashion.   The borders of the breast were identified and marked.  The inframammary incision was drawn out to make sure incision line were similar in size and location to the opposite side.  The incision was made with the #10 blade.  Mastectomy hooks were used to provide elevation of the skin edges, and the cautery was used to create the mastectomy flaps.  The dissection was taken to the fascia of the pectoralis major.  The penetrating vessels were clipped as necessary.  The flap was taken medially to the lateral sternal border, superiorly to the inferior border of the clavicle.  The breast  tissue was taken down to the inframmary fold directly under the incision.  The breast was taken off including the pectoralis fascia and the axillary tail marked.  The breast tissue directly under the nipple was marked as well.  The breast skin was very dense.    Using a hand-held gamma probe, axillary sentinel nodes were identified.  Three level 2 axillary sentinel nodes were removed and submitted to pathology.  The findings are below.  The lymphovascular channels were clipped with metal clips.        The wound was irrigated.  Two antibiotic soaked sponges were placed in the cavity while Dr. Harlow Mares was getting ready.    The patient was left in the care of Dr. Harlow Mares to perform reconstruction.    Findings: grossly clear surgical margins  Estimated Blood Loss:  50 mL         Drains: per Dr. Harlow Mares                Specimens: L breast, additional anterior margin, and three axillary sentinel nodes         Complications:  None; patient tolerated the procedure well.         Disposition: PACU - hemodynamically stable.         Condition: stable

## 2015-07-05 NOTE — Interval H&P Note (Signed)
History and Physical Interval Note:  07/05/2015 10:44 AM  Connie Bennett  has presented today for surgery, with the diagnosis of LEFT BREAST CANCER  The various methods of treatment have been discussed with the patient and family. After consideration of risks, benefits and other options for treatment, the patient has consented to  Procedure(s): NIPPLE SPARING MASTECTOMY WITH SENTINAL LYMPH NODE BIOPSY AND  BLUE DYE INJECTION (Left) LEFT BREAST RECONSTRUCTION WITH PLACEMENT OF TISSUE EXPANDER OR SILICONE ACELLULAR DERMAL MATRIX  (Left) as a surgical intervention .  The patient's history has been reviewed, patient examined, no change in status, stable for surgery.  I have reviewed the patient's chart and labs.  Questions were answered to the patient's satisfaction.     [name]

## 2015-07-05 NOTE — Brief Op Note (Signed)
07/05/2015  3:39 PM  PATIENT:  Connie Bennett  65 y.o. female  PRE-OPERATIVE DIAGNOSIS:  LEFT BREAST CANCER  POST-OPERATIVE DIAGNOSIS:  LEFT BREAST CANCER  PROCEDURE:  Procedure(s): LEFT NIPPLE SPARING MASTECTOMY WITH SENTINAL LYMPH NODE BIOPSY  (Left) LEFT BREAST RECONSTRUCTION WITH PLACEMENT OF TISSUE EXPANDER AND  ACELLULAR DERMAL MATRIX  (Left)  SURGEON:  Surgeon(s) and Role: Panel 1:    * [name], MD - Primary  Panel 2:    * Crissie Reese, MD - Primary  PHYSICIAN ASSISTANT:   ASSISTANTS: Judyann Munson, RNFA   ANESTHESIA:   general  EBL:  Total I/O In: 1000 [I.V.:1000] Out: 700 [Urine:600; Blood:100]  BLOOD ADMINISTERED:none  DRAINS: (2) Jackson-Pratt drain(s) with closed bulb suction in the left chest   LOCAL MEDICATIONS USED:  NONE  SPECIMEN:  No Specimen  DISPOSITION OF SPECIMEN:  N/A  COUNTS:  YES  TOURNIQUET:  * No tourniquets in log *  DICTATION: .Other Dictation: Dictation Number [id number]  PLAN OF CARE: Admit for overnight observation  PATIENT DISPOSITION:  PACU - hemodynamically stable.   Delay start of Pharmacological VTE agent (>24hrs) due to surgical blood loss or risk of bleeding: no

## 2015-07-06 ENCOUNTER — Encounter (HOSPITAL_COMMUNITY): Payer: Self-pay | Admitting: General Surgery

## 2015-07-06 DIAGNOSIS — C50212 Malignant neoplasm of upper-inner quadrant of left female breast: Secondary | ICD-10-CM | POA: Diagnosis not present

## 2015-07-06 LAB — BASIC METABOLIC PANEL
Anion gap: 9 (ref 5–15)
BUN: 17 mg/dL (ref 6–20)
CO2: 24 mmol/L (ref 22–32)
Calcium: 8.7 mg/dL — ABNORMAL LOW (ref 8.9–10.3)
Chloride: 103 mmol/L (ref 101–111)
Creatinine, Ser: 1.27 mg/dL — ABNORMAL HIGH (ref 0.44–1.00)
GFR calc Af Amer: 50 mL/min — ABNORMAL LOW (ref >60)
GFR calc non Af Amer: 43 mL/min — ABNORMAL LOW (ref >60)
Glucose, Bld: 141 mg/dL — ABNORMAL HIGH (ref 65–99)
Potassium: 4.3 mmol/L (ref 3.5–5.1)
Sodium: 136 mmol/L (ref 135–145)

## 2015-07-06 LAB — CBC
HCT: 28.2 % — ABNORMAL LOW (ref 36.0–46.0)
Hemoglobin: 8.7 g/dL — ABNORMAL LOW (ref 12.0–15.0)
MCH: 27.7 pg (ref 26.0–34.0)
MCHC: 30.9 g/dL (ref 30.0–36.0)
MCV: 89.8 fL (ref 78.0–100.0)
Platelets: 172 10*3/uL (ref 150–400)
RBC: 3.14 MIL/uL — ABNORMAL LOW (ref 3.87–5.11)
RDW: 15.2 % (ref 11.5–15.5)
WBC: 10.7 10*3/uL — ABNORMAL HIGH (ref 4.0–10.5)

## 2015-07-06 MED ORDER — METHOCARBAMOL 500 MG PO TABS: 500.0000 mg | ORAL_TABLET | Freq: Four times a day (QID) | ORAL | Status: DC | PRN

## 2015-07-06 MED ORDER — OXYCODONE-ACETAMINOPHEN 5-325 MG PO TABS: 1.0000 | ORAL_TABLET | ORAL | Status: DC | PRN

## 2015-07-06 MED ORDER — OXYCODONE HCL 5 MG PO TABS: 5.0000 mg | ORAL_TABLET | ORAL | Status: DC | PRN

## 2015-07-06 MED ORDER — DOXYCYCLINE HYCLATE 100 MG PO TABS: 100.0000 mg | ORAL_TABLET | Freq: Two times a day (BID) | ORAL | Status: DC

## 2015-07-06 MED ORDER — ENOXAPARIN SODIUM 40 MG/0.4ML ~~LOC~~ SOLN: 40.0000 mg | SUBCUTANEOUS | Status: DC

## 2015-07-06 MED ORDER — DOXYCYCLINE HYCLATE 100 MG PO TABS
100.0000 mg | ORAL_TABLET | Freq: Two times a day (BID) | ORAL | Status: DC
  Administered 2015-07-06: 100 mg via ORAL
  Filled 2015-07-06: qty 1

## 2015-07-06 NOTE — Op Note (Signed)
Connie Bennett, VIELE NO.:  [id number]  MEDICAL RECORD NO.:  [id number]  LOCATION:                                 FACILITY:  PHYSICIAN:  Crissie Reese, M.D.     DATE OF BIRTH:  1950/09/23  DATE OF PROCEDURE:  07/05/2015 DATE OF DISCHARGE:                              OPERATIVE REPORT   PREOPERATIVE DIAGNOSIS:  Left breast cancer.  POSTOPERATIVE DIAGNOSIS:  Left breast cancer.  PROCEDURE: 1. Immediate left breast reconstruction with a saline implant. 2. Distinct procedure is a chest wall reconstruction using acellular     dermal matrix greater than 100 cm squared for inadequate muscle and resetting of inframammary     fold.  SURGEON:  Crissie Reese, MD  ASSISTANT:  Judyann Munson, RNFA  ANESTHESIA:  General.  ESTIMATED BLOOD LOSS:  5 mL.  DRAINS:  Two 19-French drains were left.  CLINICAL NOTE:  A 65 year old woman has had a right breast cancer and had nipple-sparing mastectomy and reconstruction with implant and acellular dermal matrix and now has developed a left breast cancer and requested the same procedure.  The nature of procedure, risks, plus complications were discussed with her in detail.  She understood, these risks included, but were not limited to, bleeding, infection, healing problems, scarring, loss of sensation, and nipple fluid accumulations, contour deformities, contour deformities of the periphery to reconstruction, pneumothorax, DVT, PE, failure of device, capsular contracture, displacement device, wrinkles and ripples, asymmetry, chronic pain, and overall disappointment as well as possibility of loss of her nipple and skin.  She wished to proceed.  DESCRIPTION OF PROCEDURE:  The patient was taken to the operating room and General Surgery completed the nipple sparing mastectomy on the left side.  The space was inspected and irrigated thoroughly with saline. The mastectomy flap also inspected and found to have good  vascularity. No evidence of any ischemic compromise to the skin or the nipple-areolar complex.  The acellular dermal matrix was soaked in saline for at least 15 minutes and then changed to antibiotic solution, soaked for 15 minutes and then thoroughly stripped of fluid, pie crusted, and then rinsed with antibiotic solution as well.  The pectoralis muscle was lifted and released.  Irrigation with saline after thoroughly cleaning gloves.  The acellular dermal matrix was inset laterally first using 3-0 PDS simple interrupted sutures and then inferiorly the inframammary fold with 3-0 PDS simple interrupted sutures.  Again thoroughly cleaning gloves, the implant was prepared, placing 100 mL sterile saline using closed filling system.  The implant was returned to the antibiotic solution to soak and the submuscular space was then inspected and excellent hemostasis was confirmed.  Antibiotic solution was used to irrigate the space.  The implant was positioned and filled to the maximum 450 mL using sterile saline via a closed filling system.  The remainder of the closure from the lateral border of the pectoralis muscle to the acellular dermal matrix with 3-0 PDS simple interrupted sutures.  Great care taken to avoid damage to underlying implant which was kept under direct vision at all times.  The acellular dermal matrix was oriented in such a way that the epidermal surface was facing down  towards the implant and the dermal surface was facing upwards towards the overlying mastectomy flaps.  The drains were positioned, brought through separate stab wounds, 1 inferolateral, 1 inferomedial and secured with 3-0 Prolene sutures.  The skin edge of the inframammary crease incision was then debrided due to the significant amount of retraction that had occurred during this nipple-sparing mastectomy and nice bleeding edges were noted.  The incision closed with 3-0 Monocryl interrupted inverted deep dermal  sutures, and 3-0 Prolene simple interrupted sutures interspersed for some reinforcement.  The drains were dressed with BIOPATCH and SorbaView dressings and some Dermabond was placed on the inframammary crease incision.  Dry sterile dressings and the breast binder positioned.  She was transferred to the recovery room stable having tolerated procedure well and the disposition, she will be observed overnight.     Crissie Reese, M.D.     DB/MEDQ  D:  07/05/2015  T:  07/06/2015  Job:  [id number]  cc:   Crissie Reese, M.D.'s Office

## 2015-07-06 NOTE — Progress Notes (Signed)
Discharge paperwork reviewed with patient. Per patient, she already has her pain medicine prescription. Patient shown how to give her Lovenox injection. No questions verbalized. Patient taught drain care. Per patient she has no questions as this is her "second time around." RN offered to assist patient with getting dressed. Patient declined. RN changed patient's breast binder. The initial binder was placed with the patient's belongings. Patient instructed to wash out the old binder to use as a back up.

## 2015-07-06 NOTE — Progress Notes (Signed)
1 Day Post-Op  Subjective: Had episode of hypotension last night.  Doing better.  Has some drain leakage.    Objective: Vital signs in last 24 hours: Temp:  [97.6 F (36.4 C)-98.8 F (37.1 C)] 97.6 F (36.4 C) (04/07 0540) Pulse Rate:  [60-83] 63 (04/07 0540) Resp:  [9-36] 20 (04/07 0540) BP: (90-144)/(34-74) 97/57 mmHg (04/07 0540) SpO2:  [92 %-100 %] 92 % (04/07 0540) Weight:  [67.189 kg (148 lb 2 oz)] 67.189 kg (148 lb 2 oz) (04/06 0911)    Intake/Output from previous day: 04/06 0701 - 04/07 0700 In: 1352 [P.O.:60; I.V.:1250] Out: 700 [Urine:600; Blood:100] Intake/Output this shift:    General appearance: alert, cooperative and no distress Resp: breathing comfortably Chest wall: mild tenderness on left. Extremities: no c/c/e.    Lab Results:  No results for input(s): WBC, HGB, HCT, PLT in the last 72 hours. BMET No results for input(s): NA, K, CL, CO2, GLUCOSE, BUN, CREATININE, CALCIUM in the last 72 hours. PT/INR No results for input(s): LABPROT, INR in the last 72 hours. ABG No results for input(s): PHART, HCO3 in the last 72 hours.  Invalid input(s): PCO2, PO2  Studies/Results: Nm Sentinel Node Inj-no Rpt (breast)  07/05/2015  CLINICAL DATA: left breast cancer Sulfur colloid was injected intradermally by the nuclear medicine technologist for breast cancer sentinel node localization.    Anti-infectives: Anti-infectives    Start     Dose/Rate Route Frequency Ordered Stop   07/06/15 0900  vancomycin (VANCOCIN) IVPB 1000 mg/200 mL premix     1,000 mg 200 mL/hr over 60 Minutes Intravenous Every 24 hours 07/05/15 1844     07/05/15 1405  polymyxin B 500,000 Units, bacitracin 50,000 Units in sodium chloride irrigation 0.9 % 500 mL irrigation  Status:  Discontinued       As needed 07/05/15 1407 07/05/15 1542   07/05/15 0903  ciprofloxacin (CIPRO) 400 MG/200ML IVPB    Comments:  Connye Burkitt   : cabinet override      07/05/15 0903 07/05/15 1124   07/05/15 0856   ciprofloxacin (CIPRO) IVPB 400 mg  Status:  Discontinued     400 mg 200 mL/hr over 60 Minutes Intravenous On call to O.R. 07/05/15 0856 07/05/15 1826   07/05/15 0705  vancomycin (VANCOCIN) IVPB 1000 mg/200 mL premix     1,000 mg 200 mL/hr over 60 Minutes Intravenous On call to O.R. 07/05/15 0705 07/05/15 1100      Assessment/Plan: s/p Procedure(s): LEFT NIPPLE SPARING MASTECTOMY WITH SENTINAL LYMPH NODE BIOPSY  (Left) LEFT BREAST RECONSTRUCTION WITH PLACEMENT OF TISSUE EXPANDER AND  ACELLULAR DERMAL MATRIX  (Left) Doing well wtih pain.    Hypotension seems resolved. D/C per Dr. Harlow Mares.    Continue measuring and recording drain output at home.       [facility] 07/06/2015

## 2015-07-06 NOTE — Discharge Summary (Signed)
Physician Discharge Summary  Patient ID: Connie Bennett MRN: [id number] DOB/AGE: Jul 03, 1950 65 y.o.  Admit date: 07/05/2015 Discharge date: 07/06/2015  Admission Diagnoses: left breast cancer  Discharge Diagnoses: same Active Problems:   Breast cancer, left [facility])   Discharged Condition: good  Hospital Course: On the day of admission the patient was taken to surgery and had left nipple sparing mastectomy, sentinel node, reconstruction with saline implant and ADM. The patient tolerated the procedures well. Postoperatively, the mastectomy flap and nipple complex maintained excellent color and capillary refill. The patient was ambulatory and tolerating diet on the first postoperative day. She is ready for discharge.  Treatments: antibiotics: vancomycin, anticoagulation: LMW heparin and surgery: left mastectomy, sentinel node, reconstruction with saline implant  Discharge Exam: Blood pressure 97/57, pulse 63, temperature 97.6 F (36.4 C), temperature source Oral, resp. rate 20, height 5\' 3"  (1.6 m), weight 148 lb 2 oz (67.189 kg), SpO2 92 %.  Operative sites: Mastectomy flaps viable. Implant appears to be in good position. Drains functioning. Drainage thin. There is no evidence of bleeding or infection.  Disposition: 01-Home or Self Care     Medication List    STOP taking these medications        aspirin 81 MG chewable tablet     cyclobenzaprine 10 MG tablet  Commonly known as:  FLEXERIL     lidocaine 5 %  Commonly known as:  LIDODERM     multivitamin capsule     oxyCODONE-acetaminophen 10-325 MG tablet  Commonly known as:  PERCOCET  Replaced by:  oxyCODONE-acetaminophen 5-325 MG tablet     tiZANidine 4 MG tablet  Commonly known as:  ZANAFLEX      TAKE these medications        ALPRAZolam 0.5 MG tablet  Commonly known as:  XANAX  Take 0.5-1 mg by mouth 2 (two) times daily as needed for anxiety. Take 1 tablet (0.5 mg) by mouth if needed during the day, and 2 tablets (1  mg) if needed at bedtime     calcium carbonate 600 MG Tabs tablet  Commonly known as:  OS-CAL  Take 600 mg by mouth daily with breakfast.     doxycycline 100 MG tablet  Commonly known as:  VIBRA-TABS  Take 1 tablet (100 mg total) by mouth every 12 (twelve) hours.     enoxaparin 40 MG/0.4ML injection  Commonly known as:  LOVENOX  Inject 0.4 mLs (40 mg total) into the skin daily.     ezetimibe 10 MG tablet  Commonly known as:  ZETIA  Take 10 mg by mouth at bedtime.     furosemide 40 MG tablet  Commonly known as:  LASIX  Take 40 mg by mouth daily as needed for fluid or edema (leg swelling). Reported on 06/29/2015     gabapentin 600 MG tablet  Commonly known as:  NEURONTIN  Take 600 mg by mouth 3 (three) times daily.     meclizine 25 MG tablet  Commonly known as:  ANTIVERT  Take 25 mg by mouth 3 (three) times daily as needed for dizziness (vertigo). Reported on 06/29/2015     methocarbamol 500 MG tablet  Commonly known as:  ROBAXIN  Take 1 tablet (500 mg total) by mouth every 6 (six) hours as needed for muscle spasms.     OVER THE COUNTER MEDICATION  Take 1 drop by mouth daily as needed (dry eyes). OTC lubricating eye drops     oxyCODONE 5 MG immediate release tablet  Commonly  known as:  Oxy IR/ROXICODONE  Take 1 tablet (5 mg total) by mouth every 4 (four) hours as needed for moderate pain.     oxyCODONE-acetaminophen 5-325 MG tablet  Commonly known as:  PERCOCET/ROXICET  Take 1 tablet by mouth every 4 (four) hours as needed for moderate pain.     potassium chloride SA 20 MEQ tablet  Commonly known as:  K-DUR,KLOR-CON  Take 20 mEq by mouth daily as needed (with furosemide (lasix) dose).     ramipril 10 MG capsule  Commonly known as:  ALTACE  Take 10 mg by mouth daily.     rosuvastatin 20 MG tablet  Commonly known as:  CRESTOR  Take 20 mg by mouth daily.     venlafaxine XR 150 MG 24 hr capsule  Commonly known as:  EFFEXOR-XR  Take 150 mg by mouth 2 (two) times  daily. Take 1 capsule (150 mg) by mouth daily with breakfast and lunch     vitamin C 1000 MG tablet  Take 1,000 mg by mouth daily.           Follow-up Information    Follow up with [facility], [place] In 2 weeks.   Specialty:  General Surgery   Contact information:   [address] [place] [address] [phone]       Signed: Macon Large 07/06/2015, 8:14 AM

## 2015-07-06 NOTE — Discharge Instructions (Addendum)
No lifting for 6 weeks °No vigorous activity for 6 weeks (including outdoor walks) °No driving for 4 weeks °OK to walk up stairs slowly °Stay propped up °Use incentive spirometer at home every hour while awake °No shower while drains are in place °Empty drains at least three times a day and record the amounts separately °Change drain dressings every third day if instructed to do so by Dr. Zyiere Rosemond ° Apply Bacitracin antibiotic ointment to the drain sites ° Place gauze dressing over drains ° Secure the gauze with tape °Take an over-the-counter Probiotic while on antibiotics °Take an over-the-counter stool softener (such as Colace) while on pain medication °See Dr. Syona Wroblewski in office next week °For questions call [phone] or [phone] °

## 2015-07-08 NOTE — Anesthesia Postprocedure Evaluation (Signed)
Anesthesia Post Note  Patient: LATICE SHELLENBERGER  Procedure(s) Performed: Procedure(s) (LRB): LEFT NIPPLE SPARING MASTECTOMY WITH SENTINAL LYMPH NODE BIOPSY  (Left) LEFT BREAST RECONSTRUCTION WITH PLACEMENT OF TISSUE EXPANDER AND  ACELLULAR DERMAL MATRIX  (Left)  Patient location during evaluation: PACU Anesthesia Type: General Level of consciousness: awake Pain management: pain level controlled Vital Signs Assessment: post-procedure vital signs reviewed and stable Respiratory status: spontaneous breathing Cardiovascular status: stable Postop Assessment: no signs of nausea or vomiting Anesthetic complications: no    Last Vitals:  Filed Vitals:   07/06/15 0140 07/06/15 0540  BP: 90/50 97/57  Pulse: 61 63  Temp: 36.9 C 36.4 C  Resp: 16 20    Last Pain:  Filed Vitals:   07/06/15 1059  PainSc: 2                  Lanyla Costello

## 2015-07-09 ENCOUNTER — Telehealth: Payer: Self-pay | Admitting: *Deleted

## 2015-07-09 NOTE — Telephone Encounter (Signed)
Left vm for pt to return call to provide navigation resources. Contact information provided.

## 2015-07-11 ENCOUNTER — Telehealth: Payer: Self-pay | Admitting: *Deleted

## 2015-07-11 NOTE — Telephone Encounter (Signed)
Received order per Dr. Feng for oncotype testing. Requisition sent to pathology. Received by Keisha. 

## 2015-07-11 NOTE — Progress Notes (Signed)
Quick Note:  Please let patient know that margins are negative and LN are negative. ______

## 2015-07-13 ENCOUNTER — Encounter: Payer: Self-pay | Admitting: Hematology

## 2015-07-13 NOTE — Progress Notes (Signed)
Faxed notes/labs to [phone] [facility].

## 2015-07-20 ENCOUNTER — Telehealth: Payer: Self-pay | Admitting: *Deleted

## 2015-07-20 ENCOUNTER — Other Ambulatory Visit: Payer: Self-pay | Admitting: Hematology

## 2015-07-20 NOTE — Telephone Encounter (Signed)
Received vm call from pt stating that she has an appt soon but had a test, oncotype & she is worrying about it & would like Dr Burr Medico to call her back with results if available. Note to Dr Burr Medico.

## 2015-07-20 NOTE — Telephone Encounter (Signed)
Left vm informing we have not received her oncotype score. Expected release day is on 5/1. Gave her contact information for further questions.

## 2015-07-20 NOTE — Telephone Encounter (Signed)
Called pt & informed that onco-type not ready but would have nurse navigator call her as soon as available.  Pt reports being very emotional today.

## 2015-07-24 DIAGNOSIS — C50212 Malignant neoplasm of upper-inner quadrant of left female breast: Secondary | ICD-10-CM | POA: Diagnosis not present

## 2015-07-25 ENCOUNTER — Encounter (HOSPITAL_COMMUNITY): Payer: Self-pay

## 2015-07-27 ENCOUNTER — Encounter: Payer: Self-pay | Admitting: *Deleted

## 2015-07-27 ENCOUNTER — Encounter: Payer: Self-pay | Admitting: Hematology

## 2015-07-27 ENCOUNTER — Other Ambulatory Visit (HOSPITAL_BASED_OUTPATIENT_CLINIC_OR_DEPARTMENT_OTHER): Payer: PPO

## 2015-07-27 ENCOUNTER — Ambulatory Visit (HOSPITAL_BASED_OUTPATIENT_CLINIC_OR_DEPARTMENT_OTHER): Payer: PPO | Admitting: Hematology

## 2015-07-27 VITALS — BP 158/72 | HR 82 | Temp 98.5°F | Resp 18 | Ht 63.0 in | Wt 150.1 lb

## 2015-07-27 DIAGNOSIS — M549 Dorsalgia, unspecified: Secondary | ICD-10-CM

## 2015-07-27 DIAGNOSIS — C50212 Malignant neoplasm of upper-inner quadrant of left female breast: Secondary | ICD-10-CM

## 2015-07-27 DIAGNOSIS — Z170 Estrogen receptor positive status [ER+]: Secondary | ICD-10-CM

## 2015-07-27 DIAGNOSIS — D0511 Intraductal carcinoma in situ of right breast: Secondary | ICD-10-CM

## 2015-07-27 DIAGNOSIS — M1990 Unspecified osteoarthritis, unspecified site: Secondary | ICD-10-CM | POA: Diagnosis not present

## 2015-07-27 DIAGNOSIS — N189 Chronic kidney disease, unspecified: Secondary | ICD-10-CM

## 2015-07-27 DIAGNOSIS — C50411 Malignant neoplasm of upper-outer quadrant of right female breast: Secondary | ICD-10-CM

## 2015-07-27 DIAGNOSIS — Z171 Estrogen receptor negative status [ER-]: Secondary | ICD-10-CM

## 2015-07-27 DIAGNOSIS — I10 Essential (primary) hypertension: Secondary | ICD-10-CM

## 2015-07-27 LAB — COMPREHENSIVE METABOLIC PANEL
ALT: 32 U/L (ref 0–55)
AST: 32 U/L (ref 5–34)
Albumin: 3.9 g/dL (ref 3.5–5.0)
Alkaline Phosphatase: 80 U/L (ref 40–150)
Anion Gap: 8 mEq/L (ref 3–11)
BUN: 17.3 mg/dL (ref 7.0–26.0)
CO2: 26 mEq/L (ref 22–29)
Calcium: 10.3 mg/dL (ref 8.4–10.4)
Chloride: 107 mEq/L (ref 98–109)
Creatinine: 1.3 mg/dL — ABNORMAL HIGH (ref 0.6–1.1)
EGFR: 41 mL/min/{1.73_m2} — ABNORMAL LOW (ref >90)
Glucose: 88 mg/dl (ref 70–140)
Potassium: 4.8 mEq/L (ref 3.5–5.1)
Sodium: 142 mEq/L (ref 136–145)
Total Bilirubin: <0.3 mg/dL (ref 0.20–1.20)
Total Protein: 7.1 g/dL (ref 6.4–8.3)

## 2015-07-27 LAB — CBC WITH DIFFERENTIAL/PLATELET
BASO%: 1.1 % (ref 0.0–2.0)
Basophils Absolute: 0.1 10*3/uL (ref 0.0–0.1)
EOS%: 4.2 % (ref 0.0–7.0)
Eosinophils Absolute: 0.3 10*3/uL (ref 0.0–0.5)
HCT: 32.7 % — ABNORMAL LOW (ref 34.8–46.6)
HGB: 10.6 g/dL — ABNORMAL LOW (ref 11.6–15.9)
LYMPH%: 29.5 % (ref 14.0–49.7)
MCH: 29 pg (ref 25.1–34.0)
MCHC: 32.3 g/dL (ref 31.5–36.0)
MCV: 89.7 fL (ref 79.5–101.0)
MONO#: 0.7 10*3/uL (ref 0.1–0.9)
MONO%: 8.1 % (ref 0.0–14.0)
NEUT#: 4.7 10*3/uL (ref 1.5–6.5)
NEUT%: 57.1 % (ref 38.4–76.8)
Platelets: 256 10*3/uL (ref 145–400)
RBC: 3.64 10*6/uL — ABNORMAL LOW (ref 3.70–5.45)
RDW: 15.5 % — ABNORMAL HIGH (ref 11.2–14.5)
WBC: 8.2 10*3/uL (ref 3.9–10.3)
lymph#: 2.4 10*3/uL (ref 0.9–3.3)

## 2015-07-27 MED ORDER — EXEMESTANE 25 MG PO TABS: 25.0000 mg | ORAL_TABLET | Freq: Every day | ORAL | Status: DC

## 2015-07-27 NOTE — Progress Notes (Signed)
Connie Bennett Follow up Note   Patient Care Team: Mayra Neer, MD as PCP - General (Family Medicine) Holley Bouche, NP as Nurse Practitioner (Nurse Practitioner) [name], MD as Consulting Physician (General Surgery) Truitt Merle, MD as Consulting Physician (Hematology) Sylvan Cheese, NP as Nurse Practitioner (Hematology and Oncology) Crissie Reese, MD as Consulting Physician (Plastic Surgery)  CHIEF COMPLAINTS:  Follow up bilateral breast cancer   Oncology History   Breast cancer of upper-inner quadrant of left female breast [facility])   Staging form: Breast, AJCC 7th Edition     Clinical stage from 06/20/2015: Stage IA (T1c, N0, M0) - Signed by Truitt Merle, MD on 06/29/2015     Pathologic stage from 07/05/2015: Stage IIA (T2, N0, cM0) - Signed by Truitt Merle, MD on 07/27/2015 Breast cancer of upper-outer quadrant of right female breast [facility])   Staging form: Breast, AJCC 7th Edition     Clinical stage from 09/06/2014: Stage 0 (Tis (DCIS), N0, M0) - Signed by Truitt Merle, MD on 03/03/2015     Pathologic stage from 01/25/2015: Stage 0 (Tis (DCIS), N0, cM0) - Signed by Truitt Merle, MD on 03/03/2015        Breast cancer of upper-outer quadrant of right female breast ([place])   08/23/2014 Mammogram Mammogram and ultrasound showed a 3cm cystic lesion at 8:30 of right breast, and a 7 mm and a 5 mm increased density lesion at 10:00 of the right breast.   08/29/2014 Initial Biopsy Right breast core needle bx x 2: high-grade DCIS, with focus of mildly suspicious for early stromal invasion. ER- (0%), PR- (0%).    08/29/2014 Clinical Stage Stage 0: Tis N0   09/13/2014 Surgery Right lumpectomy/SLNB ([place]): High grade DCIS with necrosis and calcfications, positive margins. 5 LN removed and negative for malignancy (0/5). Grade 3.   09/13/2014 Pathologic Stage Stage 0: Tis N0   09/29/2014 Surgery Re-excision for positive margins; multiple breast margins re-excised, high-grade DCIS, measuring 1.0 cm, 3.0  cm, 2.0 cm, some margins are still positive or very close.    01/25/2015 Definitive Surgery Right breast mastectomy [facility]): DCIS with calcifications, high grade, spans 9 cm, LCIS, fibrocystic changes with adenosis and calcifications, negative margins. 1 LN removed and negative.   03/23/2015 Survivorship Survivorship care plan completed and mailed to patient in lieu of in person visit at request of patient.    Breast cancer of upper-inner quadrant of left female breast ([place])   06/19/2015 Mammogram Diagnostic mammogram and ultrasound showed a 1.3 cm in the upper inner quadrant of left breast, and a 0.9 mm simple cyst. Axillary was negative.   06/20/2015 Receptors her2 Your 100% positive, PR negative, Ki-67 30%, HER-2 negative   06/20/2015 Initial Biopsy The left breast upper inner quadrant mass biopsy showed invasive ductal carcinoma, grade 2   06/20/2015 Initial Diagnosis Breast cancer of upper-inner quadrant of left female breast ([place])   07/05/2015 Surgery Left breast lumpectomy and sentinel lymph node biopsy   07/05/2015 Pathology Results Left breast lumpectomy showed invasive ductal carcinoma with extracellular mucin, grade 2, spanning 2.0 cm, margins were negative, lobular carcinoma in situ, 3 sentinel lymph nodes negative   07/05/2015 Oncotype testing RS 28 (intermediate risk), which predicts 10 year risk of distant recurrence 18% with tamoxifen alone    HISTORY OF PRESENTING ILLNESS (09/06/2014):  Connie Bennett 65 y.o. female is here because of recent diagnosis of right breast DCIS  On her recent follow-up with her primary care physician, a lump in  her right breat was found on exam, and she was sent for mammogram. Her diagnostic mammogram and ultrasound on 08/19/2014 showed a 3 cm cystic lesion, an additional 7 mm and a 5 mm increased density lesion at 10:00 of her right breast. She has right breast cysts before which was biopsied before. She underwent core needle biopsy on 08/29/2014, which showed  high-grade DCIS. The aspiration of the cystic lesion showed no malignant cells on cytology.  She has chronic diffuse arthritis pain, she had a back surgery twice and has a stimulator for pain control. She is able to take care of herself, and does some light housework. She walks around with a cane. She denies any recent change of her energy level, appetite or weight.  In terms of breast cancer risk profile:  She menarched at early age of 59 and went to menopause at age 65 She had 1 pregnancy, her first child was born at age 40  She did not breast-fed her child.  She received birth control pills for approximately 15 years.  She was never exposed to fertility medications, but use hormone replacement therapy for 3-5 years  She has  family history of Breast/GYN/GI cancer  INTERIM HISTORY: Connie Bennett returns for follow-up. She is accompanied to the clinic by her husband. She underwent left breast nipple sparing mastectomy and implant basement on 07/05/2015. She tolerated surgery well overall, had a moderate pain after surgery, improved significantly lately. She has chronic body pain from her RSD, 5-6/10, but she usually does not take pain medication. She has difficulty falling asleep at night, Xanax does not help. She otherwise feels well, no other new complaints.  MEDICAL HISTORY:  Past Medical History  Diagnosis Date  . Osteoporosis   . Mitral valve prolapse     MILD /   PER PT ASYMPTOMATIC  . Chronic fatigue syndrome   . Arthritis   . History of DVT of lower extremity     2008--  BILATERAL  . Hyperlipidemia     takes Zetia and Crestor  daily  . History of drug overdose     oxycontin  and oxycodone  Sept 2015--  now has narcan injection prescription  . Breast cancer [facility]) oncologist-  dr Truitt Merle--  right upper-outer quadrant     dx May 2016--- Stage 0  (Tis,N0,M0)  DCIS  Right breast---  09-13-2014  s/p  radioactive seed/ partial mastectomy / sln bx  . Fibromyalgia   . RSD (reflex  sympathetic dystrophy)     left wrist and forearm from a fx  . Bilateral lower extremity edema     takes Furosemide daily as needed  . Sjogren's disease ([place])   . Chronic low back pain   . Anxiety     takes Xanax daily as needed  . Depression     takes Effexor daily  . HTN (hypertension)     takes Amlodipine and Ramipril daily  . Muscle spasm     takes Zanaflex daily as needed  . RSD (reflex sympathetic dystrophy)   . Family history of adverse reaction to anesthesia     mom hard to wake up  . History of bronchitis > 58yr ago  . Headache(784.0)     couple of times a week  . Vertigo     takes Meclizine daily   . Peripheral neuropathy (HCC)     takes Neurontin daily  . Joint pain   . History of hiatal hernia   . History of colon polyps  benign  . Heart murmur   . Peripheral vascular disease (HCC)     hx dvt's  . Complication of anesthesia     spinal cord stimulator- to be off for surgery    SURGICAL HISTORY: Past Surgical History  Procedure Laterality Date  . Foot surgery Bilateral 1996    [facility]'S NEUROMA  . Radioactive seed guided mastectomy with axillary sentinel lymph node biopsy Right 09/13/2014    Procedure: RADIOACTIVE SEED GUIDED PARTIAL MASTECTOMY WITH AXILLARY SENTINEL LYMPH NODE BIOPSY;  Surgeon: [name], MD;  Location: [place];  Service: General;  Laterality: Right;  . Posterior lumbar fusion  01-30-2009    L4--5 Laminectmy w/ decompression/  bilateral L4--5 microdiskectomy and fusion  . Right inferior parathyroidectomy  04-07-2006  . Spinal cord stimulator implant  2013  . Tonsillectomy  as child  . Lumbar disc surgery    . Right carpal tunnel release/  trigger release right middle finger  06-18-2006  . Orif left wrist fx's/  left carpal tunnel release  1999  . Re-excision of breast lumpectomy Right 09/29/2014    Procedure: RE-EXCISION OF BREAST LUMPECTOMY;  Surgeon: [name], MD;  Location: [place];  Service:  General;  Laterality: Right;  . Nasal sinus surgery    . Inner ear surgery Right   . Colonoscopy    . Esophagogastroduodenoscopy    . Eye lid surgery Bilateral   . Mastectomy Right 01/25/2015    nipple sparing   . Simple mastectomy with axillary sentinel node biopsy Right 01/25/2015    Procedure: RIGHT NIPPLE SPARING MASTECTOMY;  Surgeon: [name], MD;  Location: [place];  Service: General;  Laterality: Right;  . Breast reconstruction with placement of tissue expander and flex hd (acellular hydrated dermis) Right 01/25/2015    Procedure: RIGHT BREAST RECONSTRUCTION WITH PLACEMENT OF IMPLANT AND ACELLULAR DERMAL MATRIX ;  Surgeon: Crissie Reese, MD;  Location: [place];  Service: Plastics;  Laterality: Right;  . Back surgery    . Nipple sparing mastectomy/sentinal lymph node biopsy/reconstruction/placement of tissue expander Left 07/05/2015    Procedure: LEFT NIPPLE SPARING MASTECTOMY WITH SENTINAL LYMPH NODE BIOPSY ;  Surgeon: [name], MD;  Location: [place];  Service: General;  Laterality: Left;  . Breast reconstruction with placement of tissue expander and flex hd (acellular hydrated dermis) Left 07/05/2015    Procedure: LEFT BREAST RECONSTRUCTION WITH PLACEMENT OF TISSUE EXPANDER AND  ACELLULAR DERMAL MATRIX ;  Surgeon: Crissie Reese, MD;  Location: [place];  Service: Plastics;  Laterality: Left;    SOCIAL HISTORY: Social History   Social History  . Marital Status: Married    Spouse Name: N/A  . Number of Children: 1  . Years of Education: N/A   Occupational History  . unemployed     hair stylist  . disabled    Social History Main Topics  . Smoking status: Former Smoker -- 1.00 packs/day for 25 years    Types: Cigarettes    Quit date: 09/29/2014  . Smokeless tobacco: Never Used     Comment: quit smoking Jan 2016  . Alcohol Use: 0.0 oz/week    0 Standard drinks or equivalent per week     Comment: occ  . Drug Use: No  . Sexual Activity: Yes    Birth Control/ Protection:  Post-menopausal   Other Topics Concern  . Not on file   Social History Narrative    FAMILY HISTORY: Family History  Problem Relation Age of Onset  .  Heart murmur Mother   . Cancer Mother 70    carcinoid tumor   . Heart murmur Sister     x2  . Cancer Maternal Aunt     lung cancer  . Cancer Paternal Aunt     breast cancer   . Cancer Maternal Aunt     gastric cancer   . Cancer Cousin     lung cancer  . Cancer Cousin     bone cancer   . Heart attack Maternal Uncle   . Heart attack Paternal Uncle   . Hypertension Mother   . Hypertension Son   . Stroke Neg Hx     ALLERGIES:  is allergic to carbocaine; penicillins; sulfa antibiotics; amlodipine; and latex.  MEDICATIONS:  Current Outpatient Prescriptions  Medication Sig Dispense Refill  . ALPRAZolam (XANAX) 0.5 MG tablet Take 0.5-1 mg by mouth 2 (two) times daily as needed for anxiety. Take 1 tablet (0.5 mg) by mouth if needed during the day, and 2 tablets (1 mg) if needed at bedtime    . Ascorbic Acid (VITAMIN C) 1000 MG tablet Take 1,000 mg by mouth daily.    . calcium carbonate (OS-CAL) 600 MG TABS tablet Take 600 mg by mouth daily with breakfast.    . ezetimibe (ZETIA) 10 MG tablet Take 10 mg by mouth at bedtime.     . furosemide (LASIX) 40 MG tablet Take 40 mg by mouth daily as needed for fluid or edema (leg swelling). Reported on 06/29/2015  1  . gabapentin (NEURONTIN) 600 MG tablet Take 600 mg by mouth 3 (three) times daily.      . Lactobacillus (PROBIOTIC ACIDOPHILUS PO) Take 1 tablet by mouth every morning.    . meclizine (ANTIVERT) 25 MG tablet Take 25 mg by mouth 3 (three) times daily as needed for dizziness (vertigo). Reported on 06/29/2015    . methocarbamol (ROBAXIN) 500 MG tablet Take 1 tablet (500 mg total) by mouth every 6 (six) hours as needed for muscle spasms. 40 tablet 1  . OVER THE COUNTER MEDICATION Take 1 drop by mouth daily as needed (dry eyes). OTC lubricating eye drops    . oxyCODONE-acetaminophen  (PERCOCET) 10-325 MG tablet Take 1 tablet by mouth every 6 (six) hours as needed for pain (one every 4-6hrs).     . potassium chloride SA (K-DUR,KLOR-CON) 20 MEQ tablet Take 20 mEq by mouth daily as needed (with furosemide (lasix) dose).    . ramipril (ALTACE) 10 MG capsule Take 10 mg by mouth daily.    . rosuvastatin (CRESTOR) 20 MG tablet Take 20 mg by mouth daily.    Marland Kitchen venlafaxine (EFFEXOR-XR) 150 MG 24 hr capsule Take 150 mg by mouth 2 (two) times daily. Take 1 capsule (150 mg) by mouth daily with breakfast and lunch    . exemestane (AROMASIN) 25 MG tablet Take 1 tablet (25 mg total) by mouth daily after breakfast. 30 tablet 1   No current facility-administered medications for this visit.    REVIEW OF SYSTEMS:   Constitutional: Denies fevers, chills or abnormal night sweats Eyes: Denies blurriness of vision, double vision or watery eyes Ears, nose, mouth, throat, and face: Denies mucositis or sore throat Respiratory: Denies cough, dyspnea or wheezes Cardiovascular: Denies palpitation, chest discomfort or lower extremity swelling Gastrointestinal:  Denies nausea, heartburn or change in bowel habits Skin: Denies abnormal skin rashes Lymphatics: Denies new lymphadenopathy or easy bruising Neurological:Denies numbness, tingling or new weaknesses Behavioral/Psych: Mood is stable, no new changes  All  other systems were reviewed with the patient and are negative.  PHYSICAL EXAMINATION: ECOG PERFORMANCE STATUS: 1 - Symptomatic but completely ambulatory  Filed Vitals:   07/27/15 1116  BP: 158/72  Pulse: 82  Temp: 98.5 F (36.9 C)  Resp: 18   Filed Weights   07/27/15 1116  Weight: 150 lb 1.6 oz (68.085 kg)    GENERAL:alert, no distress and comfortable SKIN: skin color, texture, turgor are normal, no rashes or significant lesions EYES: normal, conjunctiva are pink and non-injected, sclera clear OROPHARYNX:no exudate, no erythema and lips, buccal mucosa, and tongue normal  NECK:  supple, thyroid normal size, non-tender, without nodularity LYMPH:  no palpable lymphadenopathy in the cervical, axillary or inguinal LUNGS: clear to auscultation and percussion with normal breathing effort HEART: regular rate & rhythm and no murmurs and no lower extremity edema ABDOMEN:abdomen soft, non-tender and normal bowel sounds Musculoskeletal:no cyanosis of digits and no clubbing  PSYCH: alert & oriented x 3 with fluent speech NEURO: no focal motor/sensory deficits Breasts: s/p bilatera nipple sparing mastectomy and implant placement, surgical incision in right breast has well-healed, the interest staging in left breast is healing well. No discharge or skin erythema. Palpitation of both breasts reviewed no palpable mass or axillary adenopathy.    LABORATORY DATA:  I have reviewed the data as listed CBC Latest Ref Rng 07/27/2015 07/06/2015 06/29/2015  WBC 3.9 - 10.3 10e3/uL 8.2 10.7(H) 9.9  Hemoglobin 11.6 - 15.9 g/dL 10.6(L) 8.7(L) 11.0(L)  Hematocrit 34.8 - 46.6 % 32.7(L) 28.2(L) 35.3(L)  Platelets 145 - 400 10e3/uL 256 172 261    CMP Latest Ref Rng 07/27/2015 07/06/2015 06/29/2015  Glucose 70 - 140 mg/dl 88 141(H) 115(H)  BUN 7.0 - 26.0 mg/dL 17.3 17 18   Creatinine 0.6 - 1.1 mg/dL 1.3(H) 1.27(H) 1.31(H)  Sodium 136 - 145 mEq/L 142 136 139  Potassium 3.5 - 5.1 mEq/L 4.8 4.3 4.2  Chloride 101 - 111 mmol/L - 103 106  CO2 22 - 29 mEq/L 26 24 23   Calcium 8.4 - 10.4 mg/dL 10.3 8.7(L) 10.2  Total Protein 6.4 - 8.3 g/dL 7.1 - -  Total Bilirubin 0.20 - 1.20 mg/dL <0.30 - -  Alkaline Phos 40 - 150 U/L 80 - -  AST 5 - 34 U/L 32 - -  ALT 0 - 55 U/L 32 - -    PATHOLOGY REPORT  Diagnosis 09/13/2014 1. Breast, lumpectomy, Right - HIGH GRADE DUCTAL CARCINOMA IN SITU WITH NECROSIS AND CALCIFICATIONS, SEE COMMENT. - TUMOR IS FOCALLY PRESENT AT LATERAL MARGIN. - TUMOR IS BROADLY PRESENT AT MEDIAL MARGIN. - TUMOR IS 1 MM FROM NEAREST ANTERIOR, POSTERIOR, AND INFERIOR MARGIN. - PREVIOUS BIOPSY  SITE. - SEE TUMOR SYNOPTIC TEMPLATE BELOW. 2. Lymph node, sentinel, biopsy, Right axilla #1 - ONE LYMPH NODE, NEGATIVE FOR TUMOR (0/1). 3. Lymph node, sentinel, biopsy, Right axilla #2 - ONE LYMPH NODE, NEGATIVE FOR TUMOR (0/1). 4. Lymph node, sentinel, biopsy, Right axilla #3 - ONE LYMPH NODE, NEGATIVE FOR TUMOR (0/1). 5. Lymph node, sentinel, biopsy, Right axilla #4 - ONE LYMPH NODE, NEGATIVE FOR TUMOR (0/1). 6. Lymph node, sentinel, biopsy, Right axilla #5 - ONE LYMPH NODE, NEGATIVE FOR TUMOR (0/1).  Diagnosis 09/29/2014 1. Breast, excision, new anterior margin (right) - FIBROCYSTIC CHANGES WITH CALCIFICATIONS. - BIOPSY SITE REACTION. - NO MALIGNANCY IDENTIFIED. 2. Breast, excision, new medial margin (right) - HIGH GRADE DUCTAL CARCINOMA IN SITU, 1.0 CM IN GREATEST DIMENSION. - DUCTAL CARCINOMA IN SITU FOCALLY INVOLVES LATERAL MARGIN. - BIOPSY SITE REACTION. 3.  Breast, excision, new inferior margin (right) - HIGH GRADE DUCTAL CARCINOMA IN SITU, 3.0 CM IN GREATEST DIMENSION. - DUCTAL CARCINOMA IN SITU FOCALLY LESS THAN 0.1 CM FROM POSTERIOR, INFERIOR, AND LATERAL MARGINS. - BIOPSY SITE REACTION. - SEE ONCOLOGY TABLE BELOW. 4. Breast, excision, new posterior margin (right) - FIBROCYSTIC CHANGES WITH CALCIFICATIONS. - NO MALIGNANCY IDENTIFIED. - BIOPSY SITE REACTION. 5. Breast, excision, new lateral margin (right) - HIGH GRADE DUCTAL CARCINOMA IN SITU, 2.0 CM IN GREATEST DIMENSION. - DUCTAL CARCINOMA IN SITU FOCALLY INVOLVES LATERAL MARGIN. - DUCTAL CARCINOMA IN SITU FOCALLY LESS THAN 0.1 CM FROM POSTERIOR MARGIN.   Diagnosis Breast, simple mastectomy, right 01/25/2015  - DUCTAL CARCINOMA IN SITU WITH CALCIFICATIONS, HIGH GRADE, SPANNING 9.0 CM. - LOBULAR NEOPLASIA (LOBULAR CARCINOMA IN SITU) - FIBROCYSTIC CHANGES WITH ADENOSIS AND CALCIFICATIONS. - THE SURGICAL RESECTION MARGINS ARE NEGATIVE FOR DUCTAL CARCINOMA. - THERE IS NO EVIDENCE OF CARCINOMA IN 1 OF 1 LYMPH NODE  (0/1).  Diagnosis 06/20/2015  Breast, left, needle core biopsy - INVASIVE DUCTAL CARCINOMA WITH EXTRACELLULAR MUCIN. - SEE COMMENT. Microscopic Comment The carcinoma appears grade 2. A breast prognostic profile will be performed and the results reported separately. The results were [facility] to [facility] on 06/21/15. (JBK:kh 06/21/15) Results: IMMUNOHISTOCHEMICAL AND MORPHOMETRIC ANALYSIS PERFORMED MANUALLY Estrogen Receptor: 100%, POSITIVE, STRONG STAINING INTENSITY Progesterone Receptor: 0%, NEGATIVE Proliferation Marker Ki67: 30%  Results: HER2 - NEGATIVE RATIO OF HER2/CEP17 SIGNALS 1.08 AVERAGE HER2 COPY NUMBER PER CELL 1.30  Diagnosis 07/05/2015 1. Breast, simple mastectomy, Left INVASIVE DUCTAL CARCINOMA WITH EXTRACELLULAR MUCIN, GRADE 2, SPINNING 2.0 CM THE CARCINOMA IS BROADLY PRESENTED LESS THAN 1 MM FROM THE DEEP MARGIN LOBULAR NEOPLASIA (LOBUAR CARCINOMA IN SITU) FIBROCYSTIC CHANGES WITH ADENOSIS AND CALCIFICATIONS 2. Lymph node, sentinel, biopsy, Left axillary ONE BENIGN LYMPH NODE (0/1) 3. Breast, excision, Left additional anterior margin over tumor BENIGN BREAST TISSUE, NEGATIVE FOR CARCINOMA 4. Lymph node, sentinel, biopsy, Left axillary ONE BENIGN LYMPH NODE (0/1) 5. Lymph node, sentinel, biopsy, Left axillary ONE BENIGN LYMPH NODE (0/1)  Microscopic Comment 1. BREAST, INVASIVE TUMOR, WITH LYMPH NODES PRESENT Specimen, including laterality and lymph node sampling (sentinel, non-sentinel): Left breast, sentinel lymph nodes Procedure: Simple mastectomy Histologic type: Ductal carcinoma Grade: 2 Tubule formation: 3 Nuclear pleomorphism: 1 Mitotic:2 Tumor size (gross measurement or glass slide measurement): 2.0 cm Margins: Invasive, distance to closest margin: Less than 1 mm from the deep margin In-situ, distance to closest margin: 0.4cm from the anteriro margin If margin positive, focally or broadly: Broadly Lymphovascular invasion: Negative Ductal  carcinoma in situ: Present Grade: 2 Extensive intraductal component: moderate Lobular neoplasia: Present Tumor focality: Focal Treatment effect: Negative If present, treatment effect in breast tissue, lymph nodes or both: NA Extent of tumor: Skin: Negative Nipple: Negative Skeletal muscle: Negative Lymph nodes: Examined: 3 Sentinel 0 Non-sentinel 0 Total Lymph nodes with metastasis: 0 Isolated tumor cells (< 0.2 mm): NA Micrometastasis: (> 0.2 mm and < 2.0 mm): NA Macrometastasis: (> 2.0 mm): NA Extracapsular extension: NA Breast prognostic profile: [id number] Estrogen receptor: 100% Progesterone receptor: 0% Her 2 neu: Negative Ki-67: 30% Non-neoplastic breast: Fibrocystic changes with adenosis and calcifications TNM: pT2, pN0 Comments: PR and Her has been reorded.  1. PROGNOSTIC INDICATORS Results: IMMUNOHISTOCHEMICAL AND MORPHOMETRIC ANALYSIS PERFORMED MANUALLY Progesterone Receptor: 0%, NEGATIVE  Results: HER2 - NEGATIVE RATIO OF HER2/CEP17 SIGNALS 1.21 AVERAGE HER2 COPY NUMBER PER CELL 1.75  ONCOTYPE DX RS 28 (intermediate risk), which predicts 10 year risk of distant recurrence 18% with tamoxifen alone  RADIOGRAPHIC STUDIES: I have personally reviewed the radiological images as listed and agreed with the findings in the report.  Bone density scan 06/28/2013  Osteopenia T-score -2.1   Diagnostic mammogram and ultrasound of the left breast and axilla 06/19/2015 Impression: Suspicious of malignancy The 1.3 cm oval solid mass with an indistinct margin in the upper inner quadrant posterior depth of the left breast is suspicious of malignancy. An ultrasound-guided biopsy is recommended.  ASSESSMENT:  65 yo female   1.  Breast cancer of upper inner quadrant of left breast,  Invasive ductal carcinoma, pT2N0M0, stage IIA, G2, ER+/PR-/HER2-, ONCOTYPE RS 28  --I discussed her surgical path result in details -the Oncotype Dx result was reviewed with her in details.  She has intermedia risk based on the recurrence score, which predicts 10 year distant recurrence after 5 years of tamoxifen 18%. The benefit of chemotherapy in the intermedia risk group is small and controversial. The predicted benefit from chemotherapy to reduce her risk of distant recurrence in her case is probably around 4% in 10-years. Given her  other medical comorbidities, and her reluctance to take chemotherapy, we decided not to pursue adjuvant chemotherapy, after a long discussion with patient and her husband.  -Giving the strong ER and PR expression in her tumor and her postmenopausal status, I recommend adjuvant endocrine therapy with  aromatase inhibitor for 5-10 years. -The potential benefit and side effects, which includes but not limited to, hot flash, skin and vaginal dryness, metabolic changes ( increased blood glucose, cholesterol, weight, etc.), slightly in increased risk of cardiovascular disease, cataracts, muscular and joint discomfort, osteopenia and osteoporosis, etc, were discussed with her in great details. She is interested. -Due to chronic body pain, I recommend her to try exemestane first. Prescription was called pain to her pharmacy today, and she will start in 1 week after her stage removal. -[place] clinical trial counseling: Patients who have completed definitive therapy for breast cancer are randomized to antiestrogen therapy (5+ years) versus antiestrogen therapy plus Palbociclib (2 years). Palbociclib: If she was randomized to Palbociclib, I discussed the risks and benefits of Ibrance including myelosuppression especially neutropenia and with that risk of infection, there is risk of pulmonary embolism and mild peripheral neuropathy as well. Fatigue, nausea, diarrhea, decreased appetite as well as alopecia and thrombocytopenia are also potential side effects of Palbociclib. -We'll screen her for [place] trial, however she may not be eligible due to her kidney issue.  - we  discussed survivorship clinic, I'll refer her on next visit  -She had a mastectomy, would not need adjuvant radiation. -We also discussed breast cancer surveillance. She has had a bilateral mastectomy, no need routine mammogram. We'll follow her clinically with lab and exam.  2. extensive right breast DCIS, >10cm, high grade ER/PR negative - this is cured by mastectomy, given the ER/PR negative status, I did not recommend chemotherapy prevention.  3 Bone health Her last DEXA scan in 2015 showed osteopenia. I encouraged her to continue calcium and vitamin D replacement  4. Arthritis, chronic back pain, hypertension, CKD -She will continue follow-up with her primary care physician  -She is on Lasix, i.e. encouraged her to back off Lasix, to see if her kidney function will improve.  Plan -we decided not to do adjuvant chemo  -Exemestane was called in to her pharmacy today, she will start in 1 week  -I'll see her back in 6 weeks with lab.  -We'll set up her survivorship referral on next visit.  -will consider  her for PALLAS trial if her renal function improves   All questions were answered. The patient knows to call the clinic with any problems, questions or concerns. I spent 30 minutes counseling the patient face to face. The total time spent in the appointment was 40 minutes and more than 50% was on counseling.     Truitt Merle, MD 07/27/2015 6:08 PM

## 2015-08-01 DIAGNOSIS — F3181 Bipolar II disorder: Secondary | ICD-10-CM | POA: Diagnosis not present

## 2015-08-14 DIAGNOSIS — M545 Low back pain: Secondary | ICD-10-CM | POA: Diagnosis not present

## 2015-08-14 DIAGNOSIS — M25551 Pain in right hip: Secondary | ICD-10-CM | POA: Diagnosis not present

## 2015-08-14 DIAGNOSIS — G893 Neoplasm related pain (acute) (chronic): Secondary | ICD-10-CM | POA: Diagnosis not present

## 2015-08-14 DIAGNOSIS — M961 Postlaminectomy syndrome, not elsewhere classified: Secondary | ICD-10-CM | POA: Diagnosis not present

## 2015-08-21 ENCOUNTER — Telehealth: Payer: Self-pay

## 2015-08-21 NOTE — Telephone Encounter (Signed)
Thu,  Please call her back. It's OK to take OTC iron pill 1-2 tab a day, her anemia has improved on the last visit. If she is concerned about her fatigue, she can come in anytime this week to have her lab done before her next appointment on 6/2, and I will check her iron level.   Thanks,  Krista Blue

## 2015-08-21 NOTE — Telephone Encounter (Signed)
Pt called stating the MD told her she was anemic and might be calling in an iron or something. There is no iron supplements on her MAR. Her next appt is 6/2. She is feeling very tired. She is aware she is recovering from her surgery but feels she is extremely tired.

## 2015-08-22 NOTE — Telephone Encounter (Signed)
lvm per Dr Feng attached message 

## 2015-08-23 ENCOUNTER — Telehealth: Payer: Self-pay | Admitting: *Deleted

## 2015-08-23 NOTE — Telephone Encounter (Signed)
Pt returned nurse call.   Stated the fine rash occurred on arms and chest  2 - 3 ago;  Now rash appears on both legs, and on lower back.  Denied pain or tenderness to touch, occasional itch noted; denied pus nor fluid filled rash noted.   Stated she started Exemestane on 07/28/15.  Instructed pt to  STOP  Exemestane from  Friday  08/24/15  Until   Monday  08/27/15.  Instructed pt to call office to give nurse update on rash condition on Tuesday  08/28/15 before resuming med.   Instructions to stop Exemestane as per Dr. Burr Medico. Pt understood to go to ER over the weekend if rash worsened and/or pain develops.    Pt's   Phone        [phone].

## 2015-08-23 NOTE — Telephone Encounter (Signed)
Called pt on both home and cell phones.  Left message on voice mail requesting a call back from pt about the rash.

## 2015-08-28 ENCOUNTER — Telehealth: Payer: Self-pay | Admitting: *Deleted

## 2015-08-28 NOTE — Telephone Encounter (Signed)
Please do not restart exemestane, keep her appointment with me on Friday  Truitt Merle

## 2015-08-28 NOTE — Telephone Encounter (Signed)
Patient called.  She had called on 5/25 [id number] and was asked to call us today with an update.  She took her last dose of exemestane  on  5/25 and had not taken any more since then.  Her rash on her chest, back and left arm has cleared up.  Her rash on her legs is improved but not gone. Her itching is better - she denies drainage, pus and fever.  She describes the rash as small, red bumps.  She is due to see Dr. Burr Medico this Friday.  Please advise as to whether to restart the exemestane.  Her call back number is [phone].  Please leave message on answering machine if she does not answer.

## 2015-08-28 NOTE — Telephone Encounter (Signed)
Called and spoke with patient and let her know that Dr. Burr Medico did not want her to restart the exemestane.  She is to keep her appt. To see Dr. Burr Medico on Friday.  Patient said she would not take any exemestane until she sees Dr. Burr Medico.  She appreciated me getting back with her.

## 2015-08-31 ENCOUNTER — Encounter: Payer: Self-pay | Admitting: Hematology

## 2015-08-31 ENCOUNTER — Telehealth: Payer: Self-pay | Admitting: Hematology

## 2015-08-31 ENCOUNTER — Ambulatory Visit (HOSPITAL_BASED_OUTPATIENT_CLINIC_OR_DEPARTMENT_OTHER): Payer: PPO | Admitting: Hematology

## 2015-08-31 ENCOUNTER — Other Ambulatory Visit (HOSPITAL_BASED_OUTPATIENT_CLINIC_OR_DEPARTMENT_OTHER): Payer: PPO

## 2015-08-31 VITALS — BP 171/67 | HR 83 | Temp 98.2°F | Resp 18 | Wt 147.1 lb

## 2015-08-31 DIAGNOSIS — C50411 Malignant neoplasm of upper-outer quadrant of right female breast: Secondary | ICD-10-CM

## 2015-08-31 DIAGNOSIS — D649 Anemia, unspecified: Secondary | ICD-10-CM

## 2015-08-31 DIAGNOSIS — C50212 Malignant neoplasm of upper-inner quadrant of left female breast: Secondary | ICD-10-CM

## 2015-08-31 DIAGNOSIS — D0511 Intraductal carcinoma in situ of right breast: Secondary | ICD-10-CM | POA: Diagnosis not present

## 2015-08-31 DIAGNOSIS — M549 Dorsalgia, unspecified: Secondary | ICD-10-CM

## 2015-08-31 DIAGNOSIS — M1990 Unspecified osteoarthritis, unspecified site: Secondary | ICD-10-CM

## 2015-08-31 DIAGNOSIS — N183 Chronic kidney disease, stage 3 (moderate): Secondary | ICD-10-CM

## 2015-08-31 DIAGNOSIS — D638 Anemia in other chronic diseases classified elsewhere: Secondary | ICD-10-CM | POA: Insufficient documentation

## 2015-08-31 DIAGNOSIS — Z170 Estrogen receptor positive status [ER+]: Secondary | ICD-10-CM

## 2015-08-31 DIAGNOSIS — I10 Essential (primary) hypertension: Secondary | ICD-10-CM

## 2015-08-31 LAB — COMPREHENSIVE METABOLIC PANEL
ALT: 21 U/L (ref 0–55)
AST: 32 U/L (ref 5–34)
Albumin: 4.2 g/dL (ref 3.5–5.0)
Alkaline Phosphatase: 88 U/L (ref 40–150)
Anion Gap: 10 mEq/L (ref 3–11)
BUN: 19.9 mg/dL (ref 7.0–26.0)
CO2: 25 mEq/L (ref 22–29)
Calcium: 10.5 mg/dL — ABNORMAL HIGH (ref 8.4–10.4)
Chloride: 107 mEq/L (ref 98–109)
Creatinine: 1.5 mg/dL — ABNORMAL HIGH (ref 0.6–1.1)
EGFR: 36 mL/min/{1.73_m2} — ABNORMAL LOW (ref >90)
Glucose: 88 mg/dl (ref 70–140)
Potassium: 4.3 mEq/L (ref 3.5–5.1)
Sodium: 141 mEq/L (ref 136–145)
Total Bilirubin: 0.38 mg/dL (ref 0.20–1.20)
Total Protein: 7.8 g/dL (ref 6.4–8.3)

## 2015-08-31 LAB — CBC WITH DIFFERENTIAL/PLATELET
BASO%: 0.8 % (ref 0.0–2.0)
Basophils Absolute: 0.1 10*3/uL (ref 0.0–0.1)
EOS%: 5 % (ref 0.0–7.0)
Eosinophils Absolute: 0.4 10*3/uL (ref 0.0–0.5)
HCT: 32.9 % — ABNORMAL LOW (ref 34.8–46.6)
HGB: 10.8 g/dL — ABNORMAL LOW (ref 11.6–15.9)
LYMPH%: 28.8 % (ref 14.0–49.7)
MCH: 30 pg (ref 25.1–34.0)
MCHC: 32.8 g/dL (ref 31.5–36.0)
MCV: 91.4 fL (ref 79.5–101.0)
MONO#: 0.8 10*3/uL (ref 0.1–0.9)
MONO%: 10.7 % (ref 0.0–14.0)
NEUT#: 4.2 10*3/uL (ref 1.5–6.5)
NEUT%: 54.7 % (ref 38.4–76.8)
Platelets: 243 10*3/uL (ref 145–400)
RBC: 3.6 10*6/uL — ABNORMAL LOW (ref 3.70–5.45)
RDW: 13.8 % (ref 11.2–14.5)
WBC: 7.6 10*3/uL (ref 3.9–10.3)
lymph#: 2.2 10*3/uL (ref 0.9–3.3)

## 2015-08-31 MED ORDER — ANASTROZOLE 1 MG PO TABS: 1.0000 mg | ORAL_TABLET | Freq: Every day | ORAL | Status: DC

## 2015-08-31 NOTE — Progress Notes (Signed)
[place] Follow up Note   Patient Care Team: Connie Neer, MD as PCP - General (Family Medicine) Connie Bouche, NP as Nurse Practitioner (Nurse Practitioner) Connie Klein, MD as Consulting Physician (General Surgery) Connie Merle, MD as Consulting Physician (Hematology) Connie Cheese, NP as Nurse Practitioner (Hematology and Oncology) Connie Reese, MD as Consulting Physician (Plastic Surgery)  CHIEF COMPLAINTS:  Follow up bilateral breast cancer   Oncology History   Breast cancer of upper-inner quadrant of left female breast [facility])   Staging form: Breast, AJCC 7th Edition     Clinical stage from 06/20/2015: Stage IA (T1c, N0, M0) - Signed by Connie Merle, MD on 06/29/2015     Pathologic stage from 07/05/2015: Stage IIA (T2, N0, cM0) - Signed by Connie Merle, MD on 07/27/2015 Breast cancer of upper-outer quadrant of right female breast [facility])   Staging form: Breast, AJCC 7th Edition     Clinical stage from 09/06/2014: Stage 0 (Tis (DCIS), N0, M0) - Signed by Connie Merle, MD on 03/03/2015     Pathologic stage from 01/25/2015: Stage 0 (Tis (DCIS), N0, cM0) - Signed by Connie Merle, MD on 03/03/2015        Breast cancer of upper-outer quadrant of right female breast ([place])   08/23/2014 Mammogram Mammogram and ultrasound showed a 3cm cystic lesion at 8:30 of right breast, and a 7 mm and a 5 mm increased density lesion at 10:00 of the right breast.   08/29/2014 Initial Biopsy Right breast core needle bx x 2: high-grade DCIS, with focus of mildly suspicious for early stromal invasion. ER- (0%), PR- (0%).    08/29/2014 Clinical Stage Stage 0: Tis N0   09/13/2014 Surgery Right lumpectomy/SLNB (Connie Bennett): High grade DCIS with necrosis and calcfications, positive margins. 5 LN removed and negative for malignancy (0/5). Grade 3.   09/13/2014 Pathologic Stage Stage 0: Tis N0   09/29/2014 Surgery Re-excision for positive margins; multiple breast margins re-excised, high-grade DCIS, measuring 1.0 cm, 3.0  cm, 2.0 cm, some margins are still positive or very close.    01/25/2015 Definitive Surgery Right breast mastectomy Connie Bennett): DCIS with calcifications, high grade, spans 9 cm, LCIS, fibrocystic changes with adenosis and calcifications, negative margins. 1 LN removed and negative.   03/23/2015 Survivorship Survivorship care plan completed and mailed to patient in lieu of in person visit at request of patient.    Breast cancer of upper-inner quadrant of left female breast ([place])   06/19/2015 Mammogram Diagnostic mammogram and ultrasound showed a 1.3 cm in the upper inner quadrant of left breast, and a 0.9 mm simple cyst. Axillary was negative.   06/20/2015 Receptors her2 Your 100% positive, PR negative, Ki-67 30%, HER-2 negative   06/20/2015 Initial Biopsy The left breast upper inner quadrant mass biopsy showed invasive ductal carcinoma, grade 2   06/20/2015 Initial Diagnosis Breast cancer of upper-inner quadrant of left female breast ([place])   07/05/2015 Surgery Left breast simple mastectomy and sentinel lymph node biopsy   07/05/2015 Pathology Results Left breast lumpectomy showed invasive ductal carcinoma with extracellular mucin, grade 2, spanning 2.0 cm, margins were negative, lobular carcinoma in situ, 3 sentinel lymph nodes negative   07/05/2015 Oncotype testing RS 28 (intermediate risk), which predicts 10 year risk of distant recurrence 18% with tamoxifen alone   07/30/2015 -  Anti-estrogen oral therapy Adjuvant exemestane 25 mg once daily, stopped after one months due to skin rash. Switched to anastrozole on 09/01/2015    HISTORY OF PRESENTING ILLNESS (09/06/2014):  Connie Bennett  [name] 65 y.o. female is here because of recent diagnosis of right breast DCIS  On her recent follow-up with her primary care physician, a lump in her right breat was found on exam, and she was sent for mammogram. Her diagnostic mammogram and ultrasound on 08/19/2014 showed a 3 cm cystic lesion, an additional 7 mm and a 5 mm increased  density lesion at 10:00 of her right breast. She has right breast cysts before which was biopsied before. She underwent core needle biopsy on 08/29/2014, which showed high-grade DCIS. The aspiration of the cystic lesion showed no malignant cells on cytology.  She has chronic diffuse arthritis pain, she had a back surgery twice and has a stimulator for pain control. She is able to take care of herself, and does some light housework. She walks around with a cane. She denies any recent change of her energy level, appetite or weight.  In terms of breast cancer risk profile:  She menarched at early age of 27 and went to menopause at age 65 She had 1 pregnancy, her first child was born at age 9  She did not breast-fed her child.  She received birth control pills for approximately 15 years.  She was never exposed to fertility medications, but use hormone replacement therapy for 3-5 years  She has  family history of Breast/GYN/GI cancer  INTERIM HISTORY: Connie Bennett returns for follow-up. She presents to the clinic by herself today. She started exemestane about months ago, tolerated well overall, with mild hot flash, no worsening of her chronic joint pain. However she developed skin rash on her arm, back and upper chest about week ago, and I told her to stop exemestane. The rash has resolved since then. No other new complaints. She does have mild fatigue, but able to tolerate routine activities without much difficulty. She likes gardening, and remains to be physically active at home.   MEDICAL HISTORY:  Past Medical History  Diagnosis Date  . Osteoporosis   . Mitral valve prolapse     MILD /   PER PT ASYMPTOMATIC  . Chronic fatigue syndrome   . Arthritis   . History of DVT of lower extremity     2008--  BILATERAL  . Hyperlipidemia     takes Zetia and Crestor  daily  . History of drug overdose     oxycontin  and oxycodone  Sept 2015--  now has narcan injection prescription  . Breast cancer [facility])  oncologist-  dr Connie Bennett--  right upper-outer quadrant     dx May 2016--- Stage 0  (Tis,N0,M0)  DCIS  Right breast---  09-13-2014  s/p  radioactive seed/ partial mastectomy / sln bx  . Fibromyalgia   . RSD (reflex sympathetic dystrophy)     left wrist and forearm from a fx  . Bilateral lower extremity edema     takes Furosemide daily as needed  . Sjogren's disease ([place])   . Chronic low back pain   . Anxiety     takes Xanax daily as needed  . Depression     takes Effexor daily  . HTN (hypertension)     takes Amlodipine and Ramipril daily  . Muscle spasm     takes Zanaflex daily as needed  . RSD (reflex sympathetic dystrophy)   . Family history of adverse reaction to anesthesia     mom hard to wake up  . History of bronchitis > 67yr ago  . Headache(784.0)     couple of times  a week  . Vertigo     takes Meclizine daily   . Peripheral neuropathy (HCC)     takes Neurontin daily  . Joint pain   . History of hiatal hernia   . History of colon polyps     benign  . Heart murmur   . Peripheral vascular disease (HCC)     hx dvt's  . Complication of anesthesia     spinal cord stimulator- to be off for surgery    SURGICAL HISTORY: Past Surgical History  Procedure Laterality Date  . Foot surgery Bilateral 1996    [facility]  . Radioactive seed guided mastectomy with axillary sentinel lymph node biopsy Right 09/13/2014    Procedure: RADIOACTIVE SEED GUIDED PARTIAL MASTECTOMY WITH AXILLARY SENTINEL LYMPH NODE BIOPSY;  Surgeon: Connie Klein, MD;  Location: [place];  Service: General;  Laterality: Right;  . Posterior lumbar fusion  01-30-2009    L4--5 Laminectmy w/ decompression/  bilateral L4--5 microdiskectomy and fusion  . Right inferior parathyroidectomy  04-07-2006  . Spinal cord stimulator implant  2013  . Tonsillectomy  as child  . Lumbar disc surgery    . Right carpal tunnel release/  trigger release right middle finger  06-18-2006  . Orif left wrist  fx's/  left carpal tunnel release  1999  . Re-excision of breast lumpectomy Right 09/29/2014    Procedure: RE-EXCISION OF BREAST LUMPECTOMY;  Surgeon: Connie Klein, MD;  Location: [place];  Service: General;  Laterality: Right;  . Nasal sinus surgery    . Inner ear surgery Right   . Colonoscopy    . Esophagogastroduodenoscopy    . Eye lid surgery Bilateral   . Mastectomy Right 01/25/2015    nipple sparing   . Simple mastectomy with axillary sentinel node biopsy Right 01/25/2015    Procedure: RIGHT NIPPLE SPARING MASTECTOMY;  Surgeon: Connie Klein, MD;  Location: [place];  Service: General;  Laterality: Right;  . Breast reconstruction with placement of tissue expander and flex hd (acellular hydrated dermis) Right 01/25/2015    Procedure: RIGHT BREAST RECONSTRUCTION WITH PLACEMENT OF IMPLANT AND ACELLULAR DERMAL MATRIX ;  Surgeon: Connie Reese, MD;  Location: [place];  Service: Plastics;  Laterality: Right;  . Back surgery    . Nipple sparing mastectomy/sentinal lymph node biopsy/reconstruction/placement of tissue expander Left 07/05/2015    Procedure: LEFT NIPPLE SPARING MASTECTOMY WITH SENTINAL LYMPH NODE BIOPSY ;  Surgeon: Connie Klein, MD;  Location: [place];  Service: General;  Laterality: Left;  . Breast reconstruction with placement of tissue expander and flex hd (acellular hydrated dermis) Left 07/05/2015    Procedure: LEFT BREAST RECONSTRUCTION WITH PLACEMENT OF TISSUE EXPANDER AND  ACELLULAR DERMAL MATRIX ;  Surgeon: Connie Reese, MD;  Location: [place];  Service: Plastics;  Laterality: Left;    SOCIAL HISTORY: Social History   Social History  . Marital Status: Married    Spouse Name: N/A  . Number of Children: 1  . Years of Education: N/A   Occupational History  . unemployed     hair stylist  . disabled    Social History Main Topics  . Smoking status: Former Smoker -- 1.00 packs/day for 25 years    Types: Cigarettes    Quit date: 09/29/2014  . Smokeless tobacco:  Never Used     Comment: quit smoking Jan 2016  . Alcohol Use: 0.0 oz/week    0 Standard drinks or equivalent per week     Comment: occ  .  Drug Use: No  . Sexual Activity: Yes    Birth Control/ Protection: Post-menopausal   Other Topics Concern  . Not on file   Social History Narrative    FAMILY HISTORY: Family History  Problem Relation Age of Onset  . Heart murmur Mother   . Cancer Mother 75    carcinoid tumor   . Heart murmur Sister     x2  . Cancer Maternal Aunt     lung cancer  . Cancer Paternal Aunt     breast cancer   . Cancer Maternal Aunt     gastric cancer   . Cancer Cousin     lung cancer  . Cancer Cousin     bone cancer   . Heart attack Maternal Uncle   . Heart attack Paternal Uncle   . Hypertension Mother   . Hypertension Son   . Stroke Neg Hx     ALLERGIES:  is allergic to carbocaine; penicillins; sulfa antibiotics; amlodipine; and latex.  MEDICATIONS:  Current Outpatient Prescriptions  Medication Sig Dispense Refill  . ALPRAZolam (XANAX) 0.5 MG tablet Take 0.5-1 mg by mouth 2 (two) times daily as needed for anxiety. Take 1 tablet (0.5 mg) by mouth if needed during the day, and 2 tablets (1 mg) if needed at bedtime    . anastrozole (ARIMIDEX) 1 MG tablet Take 1 tablet (1 mg total) by mouth daily. 30 tablet 2  . Ascorbic Acid (VITAMIN C) 1000 MG tablet Take 1,000 mg by mouth daily.    . calcium carbonate (OS-CAL) 600 MG TABS tablet Take 600 mg by mouth daily with breakfast.    . exemestane (AROMASIN) 25 MG tablet Take 1 tablet (25 mg total) by mouth daily after breakfast. 30 tablet 1  . ezetimibe (ZETIA) 10 MG tablet Take 10 mg by mouth at bedtime.     . furosemide (LASIX) 40 MG tablet Take 40 mg by mouth daily as needed for fluid or edema (leg swelling). Reported on 06/29/2015  1  . gabapentin (NEURONTIN) 600 MG tablet Take 600 mg by mouth 3 (three) times daily.      . Lactobacillus (PROBIOTIC ACIDOPHILUS PO) Take 1 tablet by mouth every morning.    .  meclizine (ANTIVERT) 25 MG tablet Take 25 mg by mouth 3 (three) times daily as needed for dizziness (vertigo). Reported on 06/29/2015    . methocarbamol (ROBAXIN) 500 MG tablet Take 1 tablet (500 mg total) by mouth every 6 (six) hours as needed for muscle spasms. 40 tablet 1  . OVER THE COUNTER MEDICATION Take 1 drop by mouth daily as needed (dry eyes). OTC lubricating eye drops    . potassium chloride SA (K-DUR,KLOR-CON) 20 MEQ tablet Take 20 mEq by mouth daily as needed (with furosemide (lasix) dose).    . ramipril (ALTACE) 10 MG capsule Take 10 mg by mouth daily.    . rosuvastatin (CRESTOR) 20 MG tablet Take 20 mg by mouth daily.    Marland Kitchen venlafaxine (EFFEXOR-XR) 150 MG 24 hr capsule Take 150 mg by mouth 2 (two) times daily. Take 1 capsule (150 mg) by mouth daily with breakfast and lunch     No current facility-administered medications for this visit.    REVIEW OF SYSTEMS:   Constitutional: Denies fevers, chills or abnormal night sweats Eyes: Denies blurriness of vision, double vision or watery eyes Ears, nose, mouth, throat, and face: Denies mucositis or sore throat Respiratory: Denies cough, dyspnea or wheezes Cardiovascular: Denies palpitation, chest discomfort or lower extremity  swelling Gastrointestinal:  Denies nausea, heartburn or change in bowel habits Skin: Denies abnormal skin rashes Lymphatics: Denies new lymphadenopathy or easy bruising Neurological:Denies numbness, tingling or new weaknesses Behavioral/Psych: Mood is stable, no new changes  All other systems were reviewed with the patient and are negative.  PHYSICAL EXAMINATION: ECOG PERFORMANCE STATUS: 1 - Symptomatic but completely ambulatory  Filed Vitals:   08/31/15 1255  BP: 171/67  Pulse: 83  Temp: 98.2 F (36.8 C)  Resp: 18   Filed Weights   08/31/15 1255  Weight: 147 lb 1.6 oz (66.724 kg)    GENERAL:alert, no distress and comfortable SKIN: skin color, texture, turgor are normal, no rashes or significant  lesions EYES: normal, conjunctiva are pink and non-injected, sclera clear OROPHARYNX:no exudate, no erythema and lips, buccal mucosa, and tongue normal  NECK: supple, thyroid normal size, non-tender, without nodularity LYMPH:  no palpable lymphadenopathy in the cervical, axillary or inguinal LUNGS: clear to auscultation and percussion with normal breathing effort HEART: regular rate & rhythm and no murmurs and no lower extremity edema ABDOMEN:abdomen soft, non-tender and normal bowel sounds Musculoskeletal:no cyanosis of digits and no clubbing  PSYCH: alert & oriented x 3 with fluent speech NEURO: no focal motor/sensory deficits Breasts: s/p bilatera nipple sparing mastectomy and implant placement, surgical incision in right breast has well-healed, the interest staging in left breast is healing well. No discharge or skin erythema. Palpitation of both breasts reviewed no palpable mass or axillary adenopathy.    LABORATORY DATA:  I have reviewed the data as listed CBC Latest Ref Rng 08/31/2015 07/27/2015 07/06/2015  WBC 3.9 - 10.3 10e3/uL 7.6 8.2 10.7(H)  Hemoglobin 11.6 - 15.9 g/dL 10.8(L) 10.6(L) 8.7(L)  Hematocrit 34.8 - 46.6 % 32.9(L) 32.7(L) 28.2(L)  Platelets 145 - 400 10e3/uL 243 256 172    CMP Latest Ref Rng 07/27/2015 07/06/2015 06/29/2015  Glucose 70 - 140 mg/dl 88 141(H) 115(H)  BUN 7.0 - 26.0 mg/dL 17.3 17 18   Creatinine 0.6 - 1.1 mg/dL 1.3(H) 1.27(H) 1.31(H)  Sodium 136 - 145 mEq/L 142 136 139  Potassium 3.5 - 5.1 mEq/L 4.8 4.3 4.2  Chloride 101 - 111 mmol/L - 103 106  CO2 22 - 29 mEq/L 26 24 23   Calcium 8.4 - 10.4 mg/dL 10.3 8.7(L) 10.2  Total Protein 6.4 - 8.3 g/dL 7.1 - -  Total Bilirubin 0.20 - 1.20 mg/dL <0.30 - -  Alkaline Phos 40 - 150 U/L 80 - -  AST 5 - 34 U/L 32 - -  ALT 0 - 55 U/L 32 - -    PATHOLOGY REPORT  Diagnosis 09/13/2014 1. Breast, lumpectomy, Right - HIGH GRADE DUCTAL CARCINOMA IN SITU WITH NECROSIS AND CALCIFICATIONS, SEE COMMENT. - TUMOR IS FOCALLY  PRESENT AT LATERAL MARGIN. - TUMOR IS BROADLY PRESENT AT MEDIAL MARGIN. - TUMOR IS 1 MM FROM NEAREST ANTERIOR, POSTERIOR, AND INFERIOR MARGIN. - PREVIOUS BIOPSY SITE. - SEE TUMOR SYNOPTIC TEMPLATE BELOW. 2. Lymph node, sentinel, biopsy, Right axilla #1 - ONE LYMPH NODE, NEGATIVE FOR TUMOR (0/1). 3. Lymph node, sentinel, biopsy, Right axilla #2 - ONE LYMPH NODE, NEGATIVE FOR TUMOR (0/1). 4. Lymph node, sentinel, biopsy, Right axilla #3 - ONE LYMPH NODE, NEGATIVE FOR TUMOR (0/1). 5. Lymph node, sentinel, biopsy, Right axilla #4 - ONE LYMPH NODE, NEGATIVE FOR TUMOR (0/1). 6. Lymph node, sentinel, biopsy, Right axilla #5 - ONE LYMPH NODE, NEGATIVE FOR TUMOR (0/1).  Diagnosis 09/29/2014 1. Breast, excision, new anterior margin (right) - FIBROCYSTIC CHANGES WITH CALCIFICATIONS. - BIOPSY SITE  REACTION. - NO MALIGNANCY IDENTIFIED. 2. Breast, excision, new medial margin (right) - HIGH GRADE DUCTAL CARCINOMA IN SITU, 1.0 CM IN GREATEST DIMENSION. - DUCTAL CARCINOMA IN SITU FOCALLY INVOLVES LATERAL MARGIN. - BIOPSY SITE REACTION. 3. Breast, excision, new inferior margin (right) - HIGH GRADE DUCTAL CARCINOMA IN SITU, 3.0 CM IN GREATEST DIMENSION. - DUCTAL CARCINOMA IN SITU FOCALLY LESS THAN 0.1 CM FROM POSTERIOR, INFERIOR, AND LATERAL MARGINS. - BIOPSY SITE REACTION. - SEE ONCOLOGY TABLE BELOW. 4. Breast, excision, new posterior margin (right) - FIBROCYSTIC CHANGES WITH CALCIFICATIONS. - NO MALIGNANCY IDENTIFIED. - BIOPSY SITE REACTION. 5. Breast, excision, new lateral margin (right) - HIGH GRADE DUCTAL CARCINOMA IN SITU, 2.0 CM IN GREATEST DIMENSION. - DUCTAL CARCINOMA IN SITU FOCALLY INVOLVES LATERAL MARGIN. - DUCTAL CARCINOMA IN SITU FOCALLY LESS THAN 0.1 CM FROM POSTERIOR MARGIN.   Diagnosis Breast, simple mastectomy, right 01/25/2015  - DUCTAL CARCINOMA IN SITU WITH CALCIFICATIONS, HIGH GRADE, SPANNING 9.0 CM. - LOBULAR NEOPLASIA (LOBULAR CARCINOMA IN SITU) - FIBROCYSTIC CHANGES  WITH ADENOSIS AND CALCIFICATIONS. - THE SURGICAL RESECTION MARGINS ARE NEGATIVE FOR DUCTAL CARCINOMA. - THERE IS NO EVIDENCE OF CARCINOMA IN 1 OF 1 LYMPH NODE (0/1).  Diagnosis 06/20/2015  Breast, left, needle core biopsy - INVASIVE DUCTAL CARCINOMA WITH EXTRACELLULAR MUCIN. - SEE COMMENT. Microscopic Comment The carcinoma appears grade 2. A breast prognostic profile will be performed and the results reported separately. The results were called to [facility] on 06/21/15. (JBK:kh 06/21/15) Results: IMMUNOHISTOCHEMICAL AND MORPHOMETRIC ANALYSIS PERFORMED MANUALLY Estrogen Receptor: 100%, POSITIVE, STRONG STAINING INTENSITY Progesterone Receptor: 0%, NEGATIVE Proliferation Marker Ki67: 30%  Results: HER2 - NEGATIVE RATIO OF HER2/CEP17 SIGNALS 1.08 AVERAGE HER2 COPY NUMBER PER CELL 1.30  Diagnosis 07/05/2015 1. Breast, simple mastectomy, Left INVASIVE DUCTAL CARCINOMA WITH EXTRACELLULAR MUCIN, GRADE 2, SPINNING 2.0 CM THE CARCINOMA IS BROADLY PRESENTED LESS THAN 1 MM FROM THE DEEP MARGIN LOBULAR NEOPLASIA (LOBUAR CARCINOMA IN SITU) FIBROCYSTIC CHANGES WITH ADENOSIS AND CALCIFICATIONS 2. Lymph node, sentinel, biopsy, Left axillary ONE BENIGN LYMPH NODE (0/1) 3. Breast, excision, Left additional anterior margin over tumor BENIGN BREAST TISSUE, NEGATIVE FOR CARCINOMA 4. Lymph node, sentinel, biopsy, Left axillary ONE BENIGN LYMPH NODE (0/1) 5. Lymph node, sentinel, biopsy, Left axillary ONE BENIGN LYMPH NODE (0/1)  Microscopic Comment 1. BREAST, INVASIVE TUMOR, WITH LYMPH NODES PRESENT Specimen, including laterality and lymph node sampling (sentinel, non-sentinel): Left breast, sentinel lymph nodes Procedure: Simple mastectomy Histologic type: Ductal carcinoma Grade: 2 Tubule formation: 3 Nuclear pleomorphism: 1 Mitotic:2 Tumor size (gross measurement or glass slide measurement): 2.0 cm Margins: Invasive, distance to closest margin: Less than 1 mm from the deep  margin In-situ, distance to closest margin: 0.4cm from the anteriro margin If margin positive, focally or broadly: Broadly Lymphovascular invasion: Negative Ductal carcinoma in situ: Present Grade: 2 Extensive intraductal component: moderate Lobular neoplasia: Present Tumor focality: Focal Treatment effect: Negative If present, treatment effect in breast tissue, lymph nodes or both: NA Extent of tumor: Skin: Negative Nipple: Negative Skeletal muscle: Negative Lymph nodes: Examined: 3 Sentinel 0 Non-sentinel 0 Total Lymph nodes with metastasis: 0 Isolated tumor cells (< 0.2 mm): NA Micrometastasis: (> 0.2 mm and < 2.0 mm): NA Macrometastasis: (> 2.0 mm): NA Extracapsular extension: NA Breast prognostic profile: [id number] Estrogen receptor: 100% Progesterone receptor: 0% Her 2 neu: Negative Ki-67: 30% Non-neoplastic breast: Fibrocystic changes with adenosis and calcifications TNM: pT2, pN0 Comments: PR and Her has been reorded.  1. PROGNOSTIC INDICATORS Results: IMMUNOHISTOCHEMICAL AND MORPHOMETRIC ANALYSIS PERFORMED MANUALLY Progesterone Receptor: 0%,  NEGATIVE  Results: HER2 - NEGATIVE RATIO OF HER2/CEP17 SIGNALS 1.21 AVERAGE HER2 COPY NUMBER PER CELL 1.75  ONCOTYPE DX RS 28 (intermediate risk), which predicts 10 year risk of distant recurrence 18% with tamoxifen alone  RADIOGRAPHIC STUDIES: I have personally reviewed the radiological images as listed and agreed with the findings in the report.  Bone density scan 06/28/2013  Osteopenia T-score -2.1   Diagnostic mammogram and ultrasound of the left breast and axilla 06/19/2015 Impression: Suspicious of malignancy The 1.3 cm oval solid mass with an indistinct margin in the upper inner quadrant posterior depth of the left breast is suspicious of malignancy. An ultrasound-guided biopsy is recommended.  ASSESSMENT:  65 yo female   1.  Breast cancer of upper inner quadrant of left breast,  Invasive ductal carcinoma,  pT2N0M0, stage IIA, G2, ER+/PR-/HER2-, ONCOTYPE RS 28  --I discussed her surgical path result in details -the Oncotype Dx result was reviewed with her in details. She has intermedia risk based on the recurrence score, which predicts 10 year distant recurrence after 5 years of tamoxifen 18%. The benefit of chemotherapy in the intermedia risk group is small and controversial. The predicted benefit from chemotherapy to reduce her risk of distant recurrence in her case is probably around 4% in 10-years. Given her  other medical comorbidities, and her reluctance to take chemotherapy, we decided not to pursue adjuvant chemotherapy, after a long discussion with patient and her husband.  -Giving the strong ER and PR expression in her tumor and her postmenopausal status, I recommend adjuvant endocrine therapy with  aromatase inhibitor for 5-10 years. -She tried exemestane first, but developed skin rash after months. Skin rash has resolved after stopped exemestane. -I recommend her to switch to anastrozole 1 mg once daily. I called into her pharmacy today. She agrees to try. Burnis Medin continue breast cancer surveillance, she had bilateral mastectomy, no need mammogram. I'll continue following her with lab and exam.  -She is not eligible for clinical trial [place] study due to her renal dysfunction.  2. extensive right breast DCIS, >10cm, high grade ER/PR negative - this is cured by mastectomy, given the ER/PR negative status, I did not recommend chemotherapy prevention.  3 Bone health Her last DEXA scan in 2015 showed osteopenia. I encouraged her to continue calcium and vitamin D replacement  4. Arthritis, chronic back pain, hypertension, CKD stage III  -She will continue follow-up with her primary care physician  -She is on Lasix, i.e. encouraged her to back off Lasix, to see if her kidney function will improve.  5. Anemia -Possibly anemia of chronic disease secondary to CKD -I'll check ret, her iron  studies, Z32, folic acid, on her next visit.  Plan -Switch exemestane to anastrozole 1 mg once daily, I called in to her pharmacy today -Survivorship clinic in 6 weeks -I'll see her back in 3 months with lab   All questions were answered. The patient knows to call the clinic with any problems, questions or concerns. I spent 20 minutes counseling the patient face to face. The total time spent in the appointment was 25 minutes and more than 50% was on counseling.     Connie Merle, MD 08/31/2015 1:17 PM

## 2015-08-31 NOTE — Telephone Encounter (Signed)
Gave pt apt & avs °

## 2015-09-05 DIAGNOSIS — M8589 Other specified disorders of bone density and structure, multiple sites: Secondary | ICD-10-CM | POA: Diagnosis not present

## 2015-09-05 DIAGNOSIS — M810 Age-related osteoporosis without current pathological fracture: Secondary | ICD-10-CM | POA: Diagnosis not present

## 2015-09-28 ENCOUNTER — Emergency Department
Admission: EM | Admit: 2015-09-28 | Discharge: 2015-09-29 | Disposition: A | Discharge status: Home / Self Care | Payer: PPO | Attending: Emergency Medicine | Admitting: Emergency Medicine

## 2015-09-28 ENCOUNTER — Emergency Department: Payer: PPO

## 2015-09-28 DIAGNOSIS — Z91040 Latex allergy status: Secondary | ICD-10-CM | POA: Insufficient documentation

## 2015-09-28 DIAGNOSIS — J209 Acute bronchitis, unspecified: Secondary | ICD-10-CM

## 2015-09-28 DIAGNOSIS — Z8679 Personal history of other diseases of the circulatory system: Secondary | ICD-10-CM | POA: Diagnosis not present

## 2015-09-28 DIAGNOSIS — Z79899 Other long term (current) drug therapy: Secondary | ICD-10-CM | POA: Diagnosis not present

## 2015-09-28 DIAGNOSIS — F329 Major depressive disorder, single episode, unspecified: Secondary | ICD-10-CM | POA: Insufficient documentation

## 2015-09-28 DIAGNOSIS — M810 Age-related osteoporosis without current pathological fracture: Secondary | ICD-10-CM | POA: Diagnosis not present

## 2015-09-28 DIAGNOSIS — Z86718 Personal history of other venous thrombosis and embolism: Secondary | ICD-10-CM | POA: Insufficient documentation

## 2015-09-28 DIAGNOSIS — Z87891 Personal history of nicotine dependence: Secondary | ICD-10-CM | POA: Diagnosis not present

## 2015-09-28 DIAGNOSIS — Z853 Personal history of malignant neoplasm of breast: Secondary | ICD-10-CM | POA: Insufficient documentation

## 2015-09-28 DIAGNOSIS — R05 Cough: Secondary | ICD-10-CM | POA: Diagnosis not present

## 2015-09-28 DIAGNOSIS — I10 Essential (primary) hypertension: Secondary | ICD-10-CM | POA: Diagnosis not present

## 2015-09-28 DIAGNOSIS — E785 Hyperlipidemia, unspecified: Secondary | ICD-10-CM | POA: Insufficient documentation

## 2015-09-28 LAB — BASIC METABOLIC PANEL
Anion gap: 8 (ref 5–15)
BUN: 14 mg/dL (ref 6–20)
CO2: 28 mmol/L (ref 22–32)
Calcium: 10.1 mg/dL (ref 8.9–10.3)
Chloride: 105 mmol/L (ref 101–111)
Creatinine, Ser: 1.48 mg/dL — ABNORMAL HIGH (ref 0.44–1.00)
GFR calc Af Amer: 41 mL/min — ABNORMAL LOW (ref >60)
GFR calc non Af Amer: 36 mL/min — ABNORMAL LOW (ref >60)
Glucose, Bld: 85 mg/dL (ref 65–99)
Potassium: 4.5 mmol/L (ref 3.5–5.1)
Sodium: 141 mmol/L (ref 135–145)

## 2015-09-28 LAB — CBC WITH DIFFERENTIAL/PLATELET
Basophils Absolute: 0.1 10*3/uL (ref 0–0.1)
Basophils Relative: 1 %
Eosinophils Absolute: 0.3 10*3/uL (ref 0–0.7)
Eosinophils Relative: 4 %
HCT: 32.8 % — ABNORMAL LOW (ref 35.0–47.0)
Hemoglobin: 11.2 g/dL — ABNORMAL LOW (ref 12.0–16.0)
Lymphocytes Relative: 31 %
Lymphs Abs: 2.8 10*3/uL (ref 1.0–3.6)
MCH: 30.2 pg (ref 26.0–34.0)
MCHC: 34.1 g/dL (ref 32.0–36.0)
MCV: 88.6 fL (ref 80.0–100.0)
Monocytes Absolute: 0.6 10*3/uL (ref 0.2–0.9)
Monocytes Relative: 6 %
Neutro Abs: 5.2 10*3/uL (ref 1.4–6.5)
Neutrophils Relative %: 58 %
Platelets: 229 10*3/uL (ref 150–440)
RBC: 3.71 MIL/uL — ABNORMAL LOW (ref 3.80–5.20)
RDW: 13.9 % (ref 11.5–14.5)
WBC: 8.9 10*3/uL (ref 3.6–11.0)

## 2015-09-28 MED ORDER — AZITHROMYCIN 500 MG PO TABS
500.0000 mg | ORAL_TABLET | Freq: Once | ORAL | Status: AC
  Administered 2015-09-29: 500 mg via ORAL
  Filled 2015-09-28: qty 1

## 2015-09-28 MED ORDER — HYDROCOD POLST-CPM POLST ER 10-8 MG/5ML PO SUER
5.0000 mL | Freq: Once | ORAL | Status: AC
  Administered 2015-09-28: 5 mL via ORAL
  Filled 2015-09-28: qty 5

## 2015-09-28 MED ORDER — IPRATROPIUM-ALBUTEROL 0.5-2.5 (3) MG/3ML IN SOLN
3.0000 mL | Freq: Once | RESPIRATORY_TRACT | Status: AC
  Administered 2015-09-28: 3 mL via RESPIRATORY_TRACT

## 2015-09-28 MED ORDER — IPRATROPIUM-ALBUTEROL 0.5-2.5 (3) MG/3ML IN SOLN
RESPIRATORY_TRACT | Status: AC
  Administered 2015-09-28: 3 mL via RESPIRATORY_TRACT
  Filled 2015-09-28: qty 3

## 2015-09-28 MED ORDER — PREDNISONE 20 MG PO TABS
60.0000 mg | ORAL_TABLET | Freq: Once | ORAL | Status: AC
  Administered 2015-09-29: 60 mg via ORAL
  Filled 2015-09-28: qty 3

## 2015-09-28 NOTE — ED Notes (Signed)
Pt presents w/ c/o cough, worsening over past week. Pt is hoarse and sounds as if she is short of breath while speaking. Pt has hx of breast CA w/ bilateral mastectomies.

## 2015-09-29 MED ORDER — AZITHROMYCIN 500 MG PO TABS: 500.0000 mg | ORAL_TABLET | Freq: Every day | ORAL | Status: AC

## 2015-09-29 MED ORDER — PREDNISONE 20 MG PO TABS: 60.0000 mg | ORAL_TABLET | Freq: Every day | ORAL | Status: AC

## 2015-09-29 MED ORDER — HYDROCOD POLST-CPM POLST ER 10-8 MG/5ML PO SUER: 5.0000 mL | Freq: Two times a day (BID) | ORAL | Status: DC | PRN

## 2015-09-29 NOTE — Discharge Instructions (Signed)

## 2015-09-29 NOTE — ED Provider Notes (Signed)
[facility] Emergency Department Provider Note  ____________________________________________  Time seen: 11:45 PM  I have reviewed the triage vital signs and the nursing notes.   HISTORY  Chief Complaint Cough      HPI Connie Bennett is a 65 y.o. female with history of breast cancer status post bilateral mastectomies presents with nonproductive persistent cough 7 days. Patient denies any fever does admit to chills nasal congestion as well. She denies any auditory pain or swelling. Patient admits to previous episodes of the same and was diagnosed with bronchitis multiple times. Patient admits to previous smoking history    Past Medical History  Diagnosis Date  . Osteoporosis   . Mitral valve prolapse     MILD /   PER PT ASYMPTOMATIC  . Chronic fatigue syndrome   . Arthritis   . History of DVT of lower extremity     2008--  BILATERAL  . Hyperlipidemia     takes Zetia and Crestor  daily  . History of drug overdose     oxycontin  and oxycodone  Sept 2015--  now has narcan injection prescription  . Breast cancer [facility]) oncologist-  dr Truitt Merle--  right upper-outer quadrant     dx May 2016--- Stage 0  (Tis,N0,M0)  DCIS  Right breast---  09-13-2014  s/p  radioactive seed/ partial mastectomy / sln bx  . Fibromyalgia   . RSD (reflex sympathetic dystrophy)     left wrist and forearm from a fx  . Bilateral lower extremity edema     takes Furosemide daily as needed  . Sjogren's disease ([place])   . Chronic low back pain   . Anxiety     takes Xanax daily as needed  . Depression     takes Effexor daily  . HTN (hypertension)     takes Amlodipine and Ramipril daily  . Muscle spasm     takes Zanaflex daily as needed  . RSD (reflex sympathetic dystrophy)   . Family history of adverse reaction to anesthesia     mom hard to wake up  . History of bronchitis > 73yrs ago  . Headache(784.0)     couple of times a week  . Vertigo     takes Meclizine daily   .  Peripheral neuropathy (HCC)     takes Neurontin daily  . Joint pain   . History of hiatal hernia   . History of colon polyps     benign  . Heart murmur   . Peripheral vascular disease (HCC)     hx dvt's  . Complication of anesthesia     spinal cord stimulator- to be off for surgery    Patient Active Problem List   Diagnosis Date Noted  . Anemia of chronic disease 08/31/2015  . Breast cancer, left ([place]) 07/05/2015  . Breast cancer of upper-inner quadrant of left female breast ([place]) 06/29/2015  . Status post mastectomy 01/25/2015  . Preoperative clearance 12/15/2014  . Right bundle branch block 12/15/2014  . Essential hypertension 12/15/2014  . Mitral valve prolapse 12/15/2014  . Breast cancer of upper-outer quadrant of right female breast ([place]) 09/04/2014  . Complex sleep apnea syndrome 11/14/2010    Past Surgical History  Procedure Laterality Date  . Foot surgery Bilateral 1996    [facility]  . Radioactive seed guided mastectomy with axillary sentinel lymph node biopsy Right 09/13/2014    Procedure: RADIOACTIVE SEED GUIDED PARTIAL MASTECTOMY WITH AXILLARY SENTINEL LYMPH NODE BIOPSY;  Surgeon: Dorris Fetch  Barry Dienes, MD;  Location: [place];  Service: General;  Laterality: Right;  . Posterior lumbar fusion  01-30-2009    L4--5 Laminectmy w/ decompression/  bilateral L4--5 microdiskectomy and fusion  . Right inferior parathyroidectomy  04-07-2006  . Spinal cord stimulator implant  2013  . Tonsillectomy  as child  . Lumbar disc surgery    . Right carpal tunnel release/  trigger release right middle finger  06-18-2006  . Orif left wrist fx's/  left carpal tunnel release  1999  . Re-excision of breast lumpectomy Right 09/29/2014    Procedure: RE-EXCISION OF BREAST LUMPECTOMY;  Surgeon: [name], MD;  Location: [place];  Service: General;  Laterality: Right;  . Nasal sinus surgery    . Inner ear surgery Right   . Colonoscopy    .  Esophagogastroduodenoscopy    . Eye lid surgery Bilateral   . Mastectomy Right 01/25/2015    nipple sparing   . Simple mastectomy with axillary sentinel node biopsy Right 01/25/2015    Procedure: RIGHT NIPPLE SPARING MASTECTOMY;  Surgeon: [name], MD;  Location: [place];  Service: General;  Laterality: Right;  . Breast reconstruction with placement of tissue expander and flex hd (acellular hydrated dermis) Right 01/25/2015    Procedure: RIGHT BREAST RECONSTRUCTION WITH PLACEMENT OF IMPLANT AND ACELLULAR DERMAL MATRIX ;  Surgeon: Crissie Reese, MD;  Location: [place];  Service: Plastics;  Laterality: Right;  . Back surgery    . Nipple sparing mastectomy/sentinal lymph node biopsy/reconstruction/placement of tissue expander Left 07/05/2015    Procedure: LEFT NIPPLE SPARING MASTECTOMY WITH SENTINAL LYMPH NODE BIOPSY ;  Surgeon: [name], MD;  Location: [place];  Service: General;  Laterality: Left;  . Breast reconstruction with placement of tissue expander and flex hd (acellular hydrated dermis) Left 07/05/2015    Procedure: LEFT BREAST RECONSTRUCTION WITH PLACEMENT OF TISSUE EXPANDER AND  ACELLULAR DERMAL MATRIX ;  Surgeon: Crissie Reese, MD;  Location: [place];  Service: Plastics;  Laterality: Left;    Current Outpatient Rx  Name  Route  Sig  Dispense  Refill  . ALPRAZolam (XANAX) 0.5 MG tablet   Oral   Take 0.5-1 mg by mouth 2 (two) times daily as needed for anxiety. Take 1 tablet (0.5 mg) by mouth if needed during the day, and 2 tablets (1 mg) if needed at bedtime         . anastrozole (ARIMIDEX) 1 MG tablet   Oral   Take 1 tablet (1 mg total) by mouth daily.   30 tablet   2   . Ascorbic Acid (VITAMIN C) 1000 MG tablet   Oral   Take 1,000 mg by mouth daily.         Marland Kitchen azithromycin (ZITHROMAX) 500 MG tablet   Oral   Take 1 tablet (500 mg total) by mouth daily.   7 tablet   0   . calcium carbonate (OS-CAL) 600 MG TABS tablet   Oral   Take 600 mg by mouth daily with breakfast.          . chlorpheniramine-HYDROcodone (TUSSIONEX) 10-8 MG/5ML SUER   Oral   Take 5 mLs by mouth every 12 (twelve) hours as needed for cough.   140 mL   0   . exemestane (AROMASIN) 25 MG tablet   Oral   Take 1 tablet (25 mg total) by mouth daily after breakfast.   30 tablet   1   . ezetimibe (ZETIA) 10 MG tablet  Oral   Take 10 mg by mouth at bedtime.          . furosemide (LASIX) 40 MG tablet   Oral   Take 40 mg by mouth daily as needed for fluid or edema (leg swelling). Reported on 06/29/2015      1   . gabapentin (NEURONTIN) 600 MG tablet   Oral   Take 600 mg by mouth 3 (three) times daily.           . Lactobacillus (PROBIOTIC ACIDOPHILUS PO)   Oral   Take 1 tablet by mouth every morning.         . meclizine (ANTIVERT) 25 MG tablet   Oral   Take 25 mg by mouth 3 (three) times daily as needed for dizziness (vertigo). Reported on 06/29/2015         . methocarbamol (ROBAXIN) 500 MG tablet   Oral   Take 1 tablet (500 mg total) by mouth every 6 (six) hours as needed for muscle spasms.   40 tablet   1   . OVER THE COUNTER MEDICATION   Oral   Take 1 drop by mouth daily as needed (dry eyes). OTC lubricating eye drops         . potassium chloride SA (K-DUR,KLOR-CON) 20 MEQ tablet   Oral   Take 20 mEq by mouth daily as needed (with furosemide (lasix) dose).         . predniSONE (DELTASONE) 20 MG tablet   Oral   Take 3 tablets (60 mg total) by mouth daily with breakfast.   15 tablet   0   . ramipril (ALTACE) 10 MG capsule   Oral   Take 10 mg by mouth daily.         . rosuvastatin (CRESTOR) 20 MG tablet   Oral   Take 20 mg by mouth daily.         Marland Kitchen venlafaxine (EFFEXOR-XR) 150 MG 24 hr capsule   Oral   Take 150 mg by mouth 2 (two) times daily. Take 1 capsule (150 mg) by mouth daily with breakfast and lunch           Allergies Carbocaine; Penicillins; Sulfa antibiotics; Amlodipine; and Latex  Family History  Problem Relation Age of Onset   . Heart murmur Mother   . Cancer Mother 102    carcinoid tumor   . Heart murmur Sister     x2  . Cancer Maternal Aunt     lung cancer  . Cancer Paternal Aunt     breast cancer   . Cancer Maternal Aunt     gastric cancer   . Cancer Cousin     lung cancer  . Cancer Cousin     bone cancer   . Heart attack Maternal Uncle   . Heart attack Paternal Uncle   . Hypertension Mother   . Hypertension Son   . Stroke Neg Hx     Social History Social History  Substance Use Topics  . Smoking status: Former Smoker -- 1.00 packs/day for 25 years    Types: Cigarettes    Quit date: 09/29/2014  . Smokeless tobacco: Never Used     Comment: quit smoking Jan 2016  . Alcohol Use: 0.0 oz/week    0 Standard drinks or equivalent per week     Comment: occ    Review of Systems  Constitutional: Negative for fever. Eyes: Negative for visual changes. ENT: Negative for sore throat. Cardiovascular: Negative for chest  pain. Respiratory: Negative for shortness of breath. Positive for cough Gastrointestinal: Negative for abdominal pain, vomiting and diarrhea. Genitourinary: Negative for dysuria. Musculoskeletal: Negative for back pain. Skin: Negative for rash. Neurological: Negative for headaches, focal weakness or numbness.   10-point ROS otherwise negative.  ____________________________________________   PHYSICAL EXAM:  VITAL SIGNS: ED Triage Vitals  Enc Vitals Group     BP 09/28/15 2139 157/69 mmHg     Pulse Rate 09/28/15 2139 83     Resp 09/28/15 2139 20     Temp 09/28/15 2139 98.3 F (36.8 C)     Temp Source 09/28/15 2139 Oral     SpO2 09/28/15 2139 99 %     Weight 09/28/15 2139 145 lb (65.772 kg)     Height 09/28/15 2139 5\' 3"  (1.6 m)     Head Cir --      Peak Flow --      Pain Score 09/28/15 2140 0     Pain Loc --      Pain Edu? --      Excl. in [place]? --      Constitutional: Alert and oriented. Well appearing and in no distress. Eyes: Conjunctivae are normal. PERRL.  Normal extraocular movements. ENT   Head: Normocephalic and atraumatic.   Nose: No congestion/rhinnorhea.   Mouth/Throat: Mucous membranes are moist.   Neck: No stridor. Hematological/Lymphatic/Immunilogical: No cervical lymphadenopathy. Cardiovascular: Normal rate, regular rhythm. Normal and symmetric distal pulses are present in all extremities. No murmurs, rubs, or gallops. Respiratory: Normal respiratory effort without tachypnea nor retractions. Breath sounds are clear and equal bilaterally. Mild expiratory wheezes Gastrointestinal: Soft and nontender. No distention. There is no CVA tenderness. Genitourinary: deferred Musculoskeletal: Nontender with normal range of motion in all extremities. No joint effusions.  No lower extremity tenderness nor edema. Neurologic:  Normal speech and language. No gross focal neurologic deficits are appreciated. Speech is normal.  Skin:  Skin is warm, dry and intact. No rash noted. Psychiatric: Mood and affect are normal. Speech and behavior are normal. Patient exhibits appropriate insight and judgment.  ____________________________________________    LABS (pertinent positives/negatives)  Labs Reviewed  CBC WITH DIFFERENTIAL/PLATELET - Abnormal; Notable for the following:    RBC 3.71 (*)    Hemoglobin 11.2 (*)    HCT 32.8 (*)    All other components within normal limits  BASIC METABOLIC PANEL - Abnormal; Notable for the following:    Creatinine, Ser 1.48 (*)    GFR calc non Af Amer 36 (*)    GFR calc Af Amer 41 (*)    All other components within normal limits     ____________________________________________   EKG  ED ECG REPORT I, Baden N BROWN, the attending physician, personally viewed and interpreted this ECG.   Date: 09/29/2015  EKG Time: 9:56PM  Rate: 76  Rhythm:Normal Sinus Rhythm  Axis: Normal  Intervals:Normal  ST&T Change: None   RADIOLOGY  DG Chest 2 View (Final result) Result time: 09/28/15 22:09:49    Final result by Rad Results In Interface (09/28/15 22:09:49)   Narrative:   CLINICAL DATA: Worsening cough over the past week.  EXAM: CHEST 2 VIEW  COMPARISON: 05/30/2008  FINDINGS: The heart size and mediastinal contours are within normal limits. Both lungs are clear. The visualized skeletal structures are unremarkable.  Implanted stimulating device in the thoracic spinal canal. Bilateral axillary surgical clips.  IMPRESSION: No active cardiopulmonary disease.   Electronically Signed By: Andreas Newport M.D. On: 09/28/2015 22:09  Procedures     INITIAL IMPRESSION / ASSESSMENT AND PLAN / ED COURSE  Pertinent labs & imaging results that were available during my care of the patient were reviewed by me and considered in my medical decision making (see chart for details).  Patient received 1 DuoNeb and prednisone, azithromycin and Tussionex in the emergency department will be prescribed same for home  ____________________________________________   FINAL CLINICAL IMPRESSION(S) / ED DIAGNOSES  Final diagnoses:  Acute bronchitis, unspecified organism      Gregor Hams, MD 09/29/15 0022

## 2015-09-29 NOTE — ED Notes (Signed)
Pt discharged to home.  Family member driving.  Discharge instructions reviewed.  Verbalized understanding.  No questions or concerns at this time.  Teach back verified.  Pt in NAD.  No items left in ED.   

## 2015-10-05 DIAGNOSIS — M797 Fibromyalgia: Secondary | ICD-10-CM | POA: Diagnosis not present

## 2015-10-05 DIAGNOSIS — N183 Chronic kidney disease, stage 3 (moderate): Secondary | ICD-10-CM | POA: Diagnosis not present

## 2015-10-05 DIAGNOSIS — G9050 Complex regional pain syndrome I, unspecified: Secondary | ICD-10-CM | POA: Diagnosis not present

## 2015-10-05 DIAGNOSIS — Z0000 Encounter for general adult medical examination without abnormal findings: Secondary | ICD-10-CM | POA: Diagnosis not present

## 2015-10-05 DIAGNOSIS — J209 Acute bronchitis, unspecified: Secondary | ICD-10-CM | POA: Diagnosis not present

## 2015-10-05 DIAGNOSIS — F322 Major depressive disorder, single episode, severe without psychotic features: Secondary | ICD-10-CM | POA: Diagnosis not present

## 2015-10-05 DIAGNOSIS — I129 Hypertensive chronic kidney disease with stage 1 through stage 4 chronic kidney disease, or unspecified chronic kidney disease: Secondary | ICD-10-CM | POA: Diagnosis not present

## 2015-10-11 ENCOUNTER — Telehealth: Payer: Self-pay | Admitting: Hematology

## 2015-10-11 DIAGNOSIS — H16122 Filamentary keratitis, left eye: Secondary | ICD-10-CM | POA: Diagnosis not present

## 2015-10-11 NOTE — Telephone Encounter (Signed)
pt cld stating sick and willc all to r/s appt

## 2015-10-12 ENCOUNTER — Encounter: Payer: PPO | Admitting: Adult Health

## 2015-10-12 DIAGNOSIS — H16222 Keratoconjunctivitis sicca, not specified as Sjogren's, left eye: Secondary | ICD-10-CM | POA: Diagnosis not present

## 2015-10-15 DIAGNOSIS — H16122 Filamentary keratitis, left eye: Secondary | ICD-10-CM | POA: Diagnosis not present

## 2015-10-24 DIAGNOSIS — Z9882 Breast implant status: Secondary | ICD-10-CM | POA: Diagnosis not present

## 2015-10-24 DIAGNOSIS — R740 Nonspecific elevation of levels of transaminase and lactic acid dehydrogenase [LDH]: Secondary | ICD-10-CM | POA: Diagnosis not present

## 2015-10-24 DIAGNOSIS — R945 Abnormal results of liver function studies: Secondary | ICD-10-CM | POA: Diagnosis not present

## 2015-10-26 DIAGNOSIS — H16122 Filamentary keratitis, left eye: Secondary | ICD-10-CM | POA: Diagnosis not present

## 2015-11-06 DIAGNOSIS — M961 Postlaminectomy syndrome, not elsewhere classified: Secondary | ICD-10-CM | POA: Diagnosis not present

## 2015-11-06 DIAGNOSIS — M25551 Pain in right hip: Secondary | ICD-10-CM | POA: Diagnosis not present

## 2015-11-06 DIAGNOSIS — M5417 Radiculopathy, lumbosacral region: Secondary | ICD-10-CM | POA: Diagnosis not present

## 2015-11-06 DIAGNOSIS — G8929 Other chronic pain: Secondary | ICD-10-CM | POA: Diagnosis not present

## 2015-11-30 ENCOUNTER — Other Ambulatory Visit (HOSPITAL_BASED_OUTPATIENT_CLINIC_OR_DEPARTMENT_OTHER): Payer: PPO

## 2015-11-30 ENCOUNTER — Ambulatory Visit (HOSPITAL_BASED_OUTPATIENT_CLINIC_OR_DEPARTMENT_OTHER): Payer: PPO | Admitting: Hematology

## 2015-11-30 ENCOUNTER — Telehealth: Payer: Self-pay | Admitting: Hematology

## 2015-11-30 ENCOUNTER — Encounter: Payer: Self-pay | Admitting: Hematology

## 2015-11-30 VITALS — BP 143/69 | HR 88 | Temp 98.7°F | Resp 17 | Ht 63.0 in | Wt 147.7 lb

## 2015-11-30 DIAGNOSIS — I10 Essential (primary) hypertension: Secondary | ICD-10-CM

## 2015-11-30 DIAGNOSIS — D649 Anemia, unspecified: Secondary | ICD-10-CM

## 2015-11-30 DIAGNOSIS — C50212 Malignant neoplasm of upper-inner quadrant of left female breast: Secondary | ICD-10-CM

## 2015-11-30 DIAGNOSIS — D638 Anemia in other chronic diseases classified elsewhere: Secondary | ICD-10-CM

## 2015-11-30 DIAGNOSIS — D0512 Intraductal carcinoma in situ of left breast: Secondary | ICD-10-CM | POA: Diagnosis not present

## 2015-11-30 DIAGNOSIS — Z171 Estrogen receptor negative status [ER-]: Secondary | ICD-10-CM

## 2015-11-30 DIAGNOSIS — N183 Chronic kidney disease, stage 3 (moderate): Secondary | ICD-10-CM

## 2015-11-30 DIAGNOSIS — M1990 Unspecified osteoarthritis, unspecified site: Secondary | ICD-10-CM

## 2015-11-30 DIAGNOSIS — M8580 Other specified disorders of bone density and structure, unspecified site: Secondary | ICD-10-CM

## 2015-11-30 DIAGNOSIS — Z170 Estrogen receptor positive status [ER+]: Secondary | ICD-10-CM

## 2015-11-30 DIAGNOSIS — M549 Dorsalgia, unspecified: Secondary | ICD-10-CM

## 2015-11-30 DIAGNOSIS — C50411 Malignant neoplasm of upper-outer quadrant of right female breast: Secondary | ICD-10-CM

## 2015-11-30 LAB — CBC & DIFF AND RETIC
BASO%: 0.6 % (ref 0.0–2.0)
Basophils Absolute: 0.1 10*3/uL (ref 0.0–0.1)
EOS%: 4.6 % (ref 0.0–7.0)
Eosinophils Absolute: 0.4 10*3/uL (ref 0.0–0.5)
HCT: 33.5 % — ABNORMAL LOW (ref 34.8–46.6)
HGB: 10.9 g/dL — ABNORMAL LOW (ref 11.6–15.9)
Immature Retic Fract: 17.2 % — ABNORMAL HIGH (ref 1.60–10.00)
LYMPH%: 31 % (ref 14.0–49.7)
MCH: 29.4 pg (ref 25.1–34.0)
MCHC: 32.5 g/dL (ref 31.5–36.0)
MCV: 90.3 fL (ref 79.5–101.0)
MONO#: 0.7 10*3/uL (ref 0.1–0.9)
MONO%: 8.5 % (ref 0.0–14.0)
NEUT#: 4.7 10*3/uL (ref 1.5–6.5)
NEUT%: 55.3 % (ref 38.4–76.8)
Platelets: 208 10*3/uL (ref 145–400)
RBC: 3.71 10*6/uL (ref 3.70–5.45)
RDW: 14.2 % (ref 11.2–14.5)
Retic %: 1.57 % (ref 0.70–2.10)
Retic Ct Abs: 58.25 10*3/uL (ref 33.70–90.70)
WBC: 8.5 10*3/uL (ref 3.9–10.3)
lymph#: 2.6 10*3/uL (ref 0.9–3.3)
nRBC: 0 % (ref 0–0)

## 2015-11-30 LAB — COMPREHENSIVE METABOLIC PANEL
ALT: 18 U/L (ref 0–55)
AST: 26 U/L (ref 5–34)
Albumin: 3.7 g/dL (ref 3.5–5.0)
Alkaline Phosphatase: 71 U/L (ref 40–150)
Anion Gap: 9 mEq/L (ref 3–11)
BUN: 13.5 mg/dL (ref 7.0–26.0)
CO2: 26 mEq/L (ref 22–29)
Calcium: 9.8 mg/dL (ref 8.4–10.4)
Chloride: 109 mEq/L (ref 98–109)
Creatinine: 1.1 mg/dL (ref 0.6–1.1)
EGFR: 51 mL/min/{1.73_m2} — ABNORMAL LOW (ref >90)
Glucose: 103 mg/dl (ref 70–140)
Potassium: 4.3 mEq/L (ref 3.5–5.1)
Sodium: 144 mEq/L (ref 136–145)
Total Bilirubin: <0.3 mg/dL (ref 0.20–1.20)
Total Protein: 6.8 g/dL (ref 6.4–8.3)

## 2015-11-30 LAB — IRON AND TIBC
%SAT: 14 % — ABNORMAL LOW (ref 21–57)
Iron: 51 ug/dL (ref 41–142)
TIBC: 364 ug/dL (ref 236–444)
UIBC: 313 ug/dL (ref 120–384)

## 2015-11-30 LAB — FERRITIN: Ferritin: 26 ng/ml (ref 9–269)

## 2015-11-30 NOTE — Telephone Encounter (Signed)
Gave patient avs report and appointments for December  °

## 2015-11-30 NOTE — Progress Notes (Addendum)
[place] Follow up Note   Patient Care Team: Connie Neer, MD as PCP - General (Family Medicine) Connie Bouche, NP as Nurse Practitioner (Nurse Practitioner) Connie Klein, MD as Consulting Physician (General Surgery) Connie Merle, MD as Consulting Physician (Hematology) Connie Cheese, NP as Nurse Practitioner (Hematology and Oncology) Connie Reese, MD as Consulting Physician (Plastic Surgery)  CHIEF COMPLAINTS:  Follow up bilateral breast cancer   Oncology History   Breast cancer of upper-outer quadrant of RIGHT female breast [facility])   Staging form: Breast, AJCC 7th Edition Clinical stage from 09/06/2014: Stage 0 (Tis (DCIS), N0, M0)  Pathologic stage from 01/25/2015: Stage 0 (Tis (DCIS), N0, cM0)  ------  Breast cancer of upper-inner quadrant of LEFT female breast ([place])   Staging form: Breast, AJCC 7th Edition Clinical stage from 06/20/2015: Stage IA (T1c, N0, M0)      Pathologic stage from 07/05/2015: Stage IIA (T2, N0, cM0)      Breast cancer of upper-outer quadrant of right female breast ([place])   08/23/2014 Mammogram    Mammogram and ultrasound showed a 3cm cystic lesion at 8:30 of right breast, and a 7 mm and a 5 mm increased density lesion at 10:00 of the right breast.      08/29/2014 Initial Biopsy    Right breast core needle bx x 2: high-grade DCIS, with focus of mildly suspicious for early stromal invasion. ER- (0%), PR- (0%).       08/29/2014 Clinical Stage    Stage 0: Tis N0      09/13/2014 Surgery    Right lumpectomy/SLNB ([place]): High grade DCIS with necrosis and calcfications, positive margins. 5 LN removed and negative for malignancy (0/5). Grade 3.      09/13/2014 Pathologic Stage    Stage 0: Tis N0      09/29/2014 Surgery    Re-excision for positive margins; multiple breast margins re-excised, high-grade DCIS, measuring 1.0 cm, 3.0 cm, 2.0 cm, some margins are still positive or very close.       01/25/2015 Definitive Surgery     Right nipple-sparing mastectomy Connie Bennett): DCIS with calcifications, high grade, spans 9 cm, LCIS, fibrocystic changes with adenosis and calcifications, negative margins. 1 LN removed and negative. Connie Bennett      03/23/2015 Survivorship    Survivorship care plan completed and mailed to patient in lieu of in person visit at request of patient.       Breast cancer of upper-inner quadrant of left female breast ([place])   06/28/2013 Imaging    DEXA scan: Osteopenia Connie Bennett)      06/19/2015 Mammogram    Diagnostic mammogram and ultrasound showed a 1.3 cm in the upper inner quadrant of left breast, and a 0.9 mm simple cyst. Axilla was negative.      06/20/2015 Receptors her2    ER 100% positive, PR negative, Ki-67 30%, HER-2 negative      06/20/2015 Initial Biopsy    The left breast upper inner quadrant mass biopsy showed invasive ductal carcinoma, grade 2      06/20/2015 Initial Diagnosis    Breast cancer of upper-inner quadrant of left female breast ([place])      07/05/2015 Surgery    Left breast nipple-sparing mastectomy and sentinel lymph node biopsy with immediate Bennett Connie Bennett); Bennett with Dr. Harlow Bennett at Connie Bennett      07/05/2015 Pathology Results    Left mastectomy: Invasive ductal carcinoma with extracellular mucin, grade 2, spanning 2.0 cm, margins were negative, lobular carcinoma  in situ, 3 sentinel lymph nodes negative. PR repeated and remains negative. HER2 remains neg (ratio 1.21)      07/05/2015 Pathologic Stage    pT2, pN0: Stage IIA       07/05/2015 Oncotype testing    RS 28 (intermediate risk), which predicts 10 year risk of distant recurrence 18% with tamoxifen alone      07/30/2015 - 08/30/2015 Anti-estrogen oral therapy    Adjuvant exemestane 25 mg once daily, stopped after one month due to skin rash.       09/01/2015 -  Anti-estrogen oral therapy    Switched to anastrozole.  Planned duration of treatment : 5-10 years Connie Bennett)        HISTORY OF PRESENTING ILLNESS (09/06/2014):  Connie Bennett 65 y.o. female is here because of recent diagnosis of right breast DCIS  On her recent follow-up with her primary care physician, a lump in her right breat was found on exam, and she was sent for mammogram. Her diagnostic mammogram and ultrasound on 08/19/2014 showed a 3 cm cystic lesion, an additional 7 mm and a 5 mm increased density lesion at 10:00 of her right breast. She has right breast cysts before which was biopsied before. She underwent core needle biopsy on 08/29/2014, which showed high-grade DCIS. The aspiration of the cystic lesion showed no malignant cells on cytology.  She has chronic diffuse arthritis pain, she had a back surgery twice and has a stimulator for pain control. She is able to take care of herself, and does some light housework. She walks around with a cane. She denies any recent change of her energy level, appetite or weight.  In terms of breast cancer risk profile:  She menarched at early age of 22 and went to menopause at age 49 She had 1 pregnancy, her first child was born at age 4  She did not breast-fed her child.  She received birth control pills for approximately 15 years.  She was never exposed to fertility medications, but use hormone replacement therapy for 3-5 years  She has  family history of Breast/GYN/GI cancer  CURRENT THERAPY: Anastrozole 1 mg daily, started on 09/28/2015  INTERIM HISTORY: Connie Bennett returns for follow-up. She presents to the clinic with her husband today. She has started anastrozole about 3 months ago, has been tolerating well overall, she had him some dizziness and headaches initially with a anastrozole, resolved afterwards. No significant hot flash, or worsening of her arthralgia. She had bronchitis about 2 months ago, still has mild residual dry cough. She also had an episode of left eye infection, resolved now. No other complaints.   MEDICAL HISTORY:  Past Medical  History:  Diagnosis Date  . Anxiety    takes Xanax daily as needed  . Arthritis   . Bilateral lower extremity edema    takes Furosemide daily as needed  . Breast cancer [facility]) oncologist-  dr Connie Bennett--  right upper-outer quadrant    dx May 2016--- Stage 0  (Tis,N0,M0)  DCIS  Right breast---  09-13-2014  s/p  radioactive seed/ partial mastectomy / sln bx  . Chronic fatigue syndrome   . Chronic low back pain   . Complication of anesthesia    spinal cord stimulator- to be off for surgery  . Depression    takes Effexor daily  . Family history of adverse reaction to anesthesia    mom hard to wake up  . Fibromyalgia   . Headache(784.0)    couple of times a  week  . Heart murmur   . History of bronchitis > 45yr ago  . History of colon polyps    benign  . History of drug overdose    oxycontin  and oxycodone  Sept 2015--  now has narcan injection prescription  . History of DVT of lower extremity    2008--  BILATERAL  . History of hiatal hernia   . HTN (hypertension)    takes Amlodipine and Ramipril daily  . Hyperlipidemia    takes Zetia and Crestor  daily  . Joint pain   . Mitral valve prolapse    MILD /   PER PT ASYMPTOMATIC  . Muscle spasm    takes Zanaflex daily as needed  . Osteoporosis   . Peripheral neuropathy (HCC)    takes Neurontin daily  . Peripheral vascular disease (HCC)    hx dvt's  . RSD (reflex sympathetic dystrophy)    left wrist and forearm from a fx  . RSD (reflex sympathetic dystrophy)   . S[place]ogren's disease ([place]   . Vertigo    takes Meclizine daily     SURGICAL HISTORY: Past Surgical History:  Procedure Laterality Date  . BACK SURGERY    . BREAST Bennett WITH PLACEMENT OF TISSUE EXPANDER AND FLEX HD (ACELLULAR HYDRATED DERMIS) Right 01/25/2015   Procedure: RIGHT BREAST Bennett WITH PLACEMENT OF IMPLANT AND ACELLULAR DERMAL MATRIX ;  Surgeon: DCrissie Reese MD;  [place] [facility]  Service: Plastics;  Laterality: Right;  . BREAST  Bennett WITH PLACEMENT OF TISSUE EXPANDER AND FLEX HD (ACELLULAR HYDRATED DERMIS) Left 07/05/2015   Procedure: LEFT BREAST Bennett WITH PLACEMENT OF TISSUE EXPANDER AND  ACELLULAR DERMAL MATRIX ;  Surgeon: DCrissie Reese MD;  [place] [facility]  Service: Plastics;  Laterality: Left;  . COLONOSCOPY    . ESOPHAGOGASTRODUODENOSCOPY    . eye lid surgery Bilateral   . FOOT SURGERY Bilateral 1996   MORTON'S NEUROMA  . INNER EAR SURGERY Right   . LUMBAR DISC SURGERY    . MASTECTOMY Right 01/25/2015   nipple sparing   . NASAL SINUS SURGERY    . NIPPLE SPARING MASTECTOMY/SENTINAL LYMPH NODE BIOPSY/Bennett/PLACEMENT OF TISSUE EXPANDER Left 07/05/2015   Procedure: LEFT NIPPLE SPARING MASTECTOMY WITH SENTINAL LYMPH NODE BIOPSY ;  Surgeon: FStark Klein MD;  [place] [facility]  Service: General;  Laterality: Left;  . ORIF LEFT WRIST FX'S/  LEFT CARPAL TUNNEL RELEASE  1999  . POSTERIOR LUMBAR FUSION  01-30-2009   L4--5 Laminectmy w/ decompression/  bilateral L4--5 microdiskectomy and fusion  . RADIOACTIVE SEED GUIDED MASTECTOMY WITH AXILLARY SENTINEL LYMPH NODE BIOPSY Right 09/13/2014   Procedure: RADIOACTIVE SEED GUIDED PARTIAL MASTECTOMY WITH AXILLARY SENTINEL LYMPH NODE BIOPSY;  Surgeon: FStark Klein MD;  [place] [facility]  Service: General;  Laterality: Right;  . RE-EXCISION OF BREAST LUMPECTOMY Right 09/29/2014   Procedure: RE-EXCISION OF BREAST LUMPECTOMY;  Surgeon: FStark Klein MD;  [place] [facility]  Service: General;  Laterality: Right;  . RIGHT CARPAL TUNNEL RELEASE/  TRIGGER RELEASE RIGHT MIDDLE FINGER  06-18-2006  . RIGHT INFERIOR PARATHYROIDECTOMY  04-07-2006  . SIMPLE MASTECTOMY WITH AXILLARY SENTINEL NODE BIOPSY Right 01/25/2015   Procedure: RIGHT NIPPLE SPARING MASTECTOMY;  Surgeon: FStark Klein MD;  [place] [facility]  Service: General;  Laterality: Right;  . SPINAL CORD STIMULATOR IMPLANT  2013  . TONSILLECTOMY  as child    SOCIAL  HISTORY: Social History   Social History  . Marital status: Married    Spouse  name: N/A  . Number of children: 1  . Years of education: N/A   Occupational History  . unemployed Disabled    Probation officer  . disabled    Social History Main Topics  . Smoking status: Former Smoker    Packs/day: 1.00    Years: 25.00    Types: Cigarettes    Quit date: 09/29/2014  . Smokeless tobacco: Never Used     Comment: quit smoking Jan 2016  . Alcohol use 0.0 oz/week     Comment: occ  . Drug use: No  . Sexual activity: Yes    Birth control/ protection: Post-menopausal   Other Topics Concern  . Not on file   Social History Narrative  . No narrative on file    FAMILY HISTORY: Family History  Problem Relation Age of Onset  . Heart murmur Mother   . Cancer Mother 68    carcinoid tumor   . Heart murmur Sister     x2  . Cancer Maternal Aunt     lung cancer  . Cancer Paternal Aunt     breast cancer   . Cancer Maternal Aunt     gastric cancer   . Cancer Cousin     lung cancer  . Cancer Cousin     bone cancer   . Heart attack Maternal Uncle   . Heart attack Paternal Uncle   . Hypertension Mother   . Hypertension Son   . Stroke Neg Hx     ALLERGIES:  is allergic to carbocaine [mepivacaine hcl]; penicillins; sulfa antibiotics; amlodipine; and latex.  MEDICATIONS:  Current Outpatient Prescriptions  Medication Sig Dispense Refill  . ALPRAZolam (XANAX) 0.5 MG tablet Take 0.5-1 mg by mouth 2 (two) times daily as needed for anxiety. Take 1 tablet (0.5 mg) by mouth if needed during the day, and 2 tablets (1 mg) if needed at bedtime    . anastrozole (ARIMIDEX) 1 MG tablet Take 1 tablet (1 mg total) by mouth daily. 30 tablet 2  . Ascorbic Acid (VITAMIN C) 1000 MG tablet Take 1,000 mg by mouth daily.    . calcium carbonate (OS-CAL) 600 MG TABS tablet Take 600 mg by mouth daily with breakfast.    . chlorpheniramine-HYDROcodone (TUSSIONEX) 10-8 MG/5ML SUER Take 5 mLs by mouth every 12  (twelve) hours as needed for cough. 140 mL 0  . ezetimibe (ZETIA) 10 MG tablet Take 10 mg by mouth at bedtime.     . furosemide (LASIX) 40 MG tablet Take 40 mg by mouth daily as needed for fluid or edema (leg swelling). Reported on 06/29/2015  1  . gabapentin (NEURONTIN) 600 MG tablet Take 600 mg by mouth 3 (three) times daily.      . Lactobacillus (PROBIOTIC ACIDOPHILUS PO) Take 1 tablet by mouth every morning.    . meclizine (ANTIVERT) 25 MG tablet Take 25 mg by mouth 3 (three) times daily as needed for dizziness (vertigo). Reported on 06/29/2015    . methocarbamol (ROBAXIN) 500 MG tablet Take 1 tablet (500 mg total) by mouth every 6 (six) hours as needed for muscle spasms. 40 tablet 1  . OVER THE COUNTER MEDICATION Take 1 drop by mouth daily as needed (dry eyes). OTC lubricating eye drops    . potassium chloride SA (K-DUR,KLOR-CON) 20 MEQ tablet Take 20 mEq by mouth daily as needed (with furosemide (lasix) dose).    . ramipril (ALTACE) 10 MG capsule Take 10 mg by mouth daily.    . rosuvastatin (CRESTOR)  20 MG tablet Take 20 mg by mouth daily.    Marland Kitchen venlafaxine (EFFEXOR-XR) 150 MG 24 hr capsule Take 150 mg by mouth 2 (two) times daily. Take 1 capsule (150 mg) by mouth daily with breakfast and lunch    . alendronate (FOSAMAX) 70 MG tablet Take 70 mg by mouth every 7 (seven) days.    . traZODone (DESYREL) 100 MG tablet Take 300 mg by mouth daily as needed.     No current facility-administered medications for this visit.     REVIEW OF SYSTEMS:   Constitutional: Denies fevers, chills or abnormal night sweats Eyes: Denies blurriness of vision, double vision or watery eyes Ears, nose, mouth, throat, and face: Denies mucositis or sore throat Respiratory: Denies cough, dyspnea or wheezes Cardiovascular: Denies palpitation, chest discomfort or lower extremity swelling Gastrointestinal:  Denies nausea, heartburn or change in bowel habits Skin: Denies abnormal skin rashes Lymphatics: Denies new  lymphadenopathy or easy bruising Neurological:Denies numbness, tingling or new weaknesses Behavioral/Psych: Mood is stable, no new changes  All other systems were reviewed with the patient and are negative.  PHYSICAL EXAMINATION: ECOG PERFORMANCE STATUS: 1 - Symptomatic but completely ambulatory  Vitals:   11/30/15 1040  BP: (!) 143/69  Pulse: 88  Resp: 17  Temp: 98.7 F (37.1 C)   Filed Weights   11/30/15 1040  Weight: 147 lb 11.2 oz (67 kg)    GENERAL:alert, no distress and comfortable SKIN: skin color, texture, turgor are normal, no rashes or significant lesions EYES: normal, conjunctiva are pink and non-injected, sclera clear OROPHARYNX:no exudate, no erythema and lips, buccal mucosa, and tongue normal  NECK: supple, thyroid normal size, non-tender, without nodularity LYMPH:  no palpable lymphadenopathy in the cervical, axillary or inguinal LUNGS: clear to auscultation and percussion with normal breathing effort HEART: regular rate & rhythm and no murmurs and no lower extremity edema ABDOMEN:abdomen soft, non-tender and normal bowel sounds Musculoskeletal:no cyanosis of digits and no clubbing  PSYCH: alert & oriented x 3 with fluent speech NEURO: no focal motor/sensory deficits Breasts: s/p bilatera nipple sparing mastectomy and implant placement, surgical incision in right breast has well-healed, the interest staging in left breast is healing well. No discharge or skin erythema. Palpitation of both breasts reviewed no palpable mass or axillary adenopathy.    LABORATORY DATA:  I have reviewed the data as listed CBC Latest Ref Rng & Units 11/30/2015 09/28/2015 08/31/2015  WBC 3.9 - 10.3 10e3/uL 8.5 8.9 7.6  Hemoglobin 11.6 - 15.9 g/dL 10.9(L) 11.2(L) 10.8(L)  Hematocrit 34.8 - 46.6 % 33.5(L) 32.8(L) 32.9(L)  Platelets 145 - 400 10e3/uL 208 229 243    CMP Latest Ref Rng & Units 11/30/2015 09/28/2015 08/31/2015  Glucose 70 - 140 mg/dl 103 85 88  BUN 7.0 - 26.0 mg/dL 13.5 14 19.9   Creatinine 0.6 - 1.1 mg/dL 1.1 1.48(H) 1.5(H)  Sodium 136 - 145 mEq/L 144 141 141  Potassium 3.5 - 5.1 mEq/L 4.3 4.5 4.3  Chloride 101 - 111 mmol/L - 105 -  CO2 22 - 29 mEq/L '26 28 25  '$ Calcium 8.4 - 10.4 mg/dL 9.8 10.1 10.5(H)  Total Protein 6.4 - 8.3 g/dL 6.8 - 7.8  Total Bilirubin 0.20 - 1.20 mg/dL <0.30 - 0.38  Alkaline Phos 40 - 150 U/L 71 - 88  AST 5 - 34 U/L 26 - 32  ALT 0 - 55 U/L 18 - 21    PATHOLOGY REPORT  Diagnosis 09/13/2014 1. Breast, lumpectomy, Right - HIGH GRADE DUCTAL CARCINOMA IN  SITU WITH NECROSIS AND CALCIFICATIONS, SEE COMMENT. - TUMOR IS FOCALLY PRESENT AT LATERAL MARGIN. - TUMOR IS BROADLY PRESENT AT MEDIAL MARGIN. - TUMOR IS 1 MM FROM NEAREST ANTERIOR, POSTERIOR, AND INFERIOR MARGIN. - PREVIOUS BIOPSY SITE. - SEE TUMOR SYNOPTIC TEMPLATE BELOW. 2. Lymph node, sentinel, biopsy, Right axilla #1 - ONE LYMPH NODE, NEGATIVE FOR TUMOR (0/1). 3. Lymph node, sentinel, biopsy, Right axilla #2 - ONE LYMPH NODE, NEGATIVE FOR TUMOR (0/1). 4. Lymph node, sentinel, biopsy, Right axilla #3 - ONE LYMPH NODE, NEGATIVE FOR TUMOR (0/1). 5. Lymph node, sentinel, biopsy, Right axilla #4 - ONE LYMPH NODE, NEGATIVE FOR TUMOR (0/1). 6. Lymph node, sentinel, biopsy, Right axilla #5 - ONE LYMPH NODE, NEGATIVE FOR TUMOR (0/1).  Diagnosis 09/29/2014 1. Breast, excision, new anterior margin (right) - FIBROCYSTIC CHANGES WITH CALCIFICATIONS. - BIOPSY SITE REACTION. - NO MALIGNANCY IDENTIFIED. 2. Breast, excision, new medial margin (right) - HIGH GRADE DUCTAL CARCINOMA IN SITU, 1.0 CM IN GREATEST DIMENSION. - DUCTAL CARCINOMA IN SITU FOCALLY INVOLVES LATERAL MARGIN. - BIOPSY SITE REACTION. 3. Breast, excision, new inferior margin (right) - HIGH GRADE DUCTAL CARCINOMA IN SITU, 3.0 CM IN GREATEST DIMENSION. - DUCTAL CARCINOMA IN SITU FOCALLY LESS THAN 0.1 CM FROM POSTERIOR, INFERIOR, AND LATERAL MARGINS. - BIOPSY SITE REACTION. - SEE ONCOLOGY TABLE BELOW. 4. Breast, excision, new  posterior margin (right) - FIBROCYSTIC CHANGES WITH CALCIFICATIONS. - NO MALIGNANCY IDENTIFIED. - BIOPSY SITE REACTION. 5. Breast, excision, new lateral margin (right) - HIGH GRADE DUCTAL CARCINOMA IN SITU, 2.0 CM IN GREATEST DIMENSION. - DUCTAL CARCINOMA IN SITU FOCALLY INVOLVES LATERAL MARGIN. - DUCTAL CARCINOMA IN SITU FOCALLY LESS THAN 0.1 CM FROM POSTERIOR MARGIN.   Diagnosis Breast, simple mastectomy, right 01/25/2015  - DUCTAL CARCINOMA IN SITU WITH CALCIFICATIONS, HIGH GRADE, SPANNING 9.0 CM. - LOBULAR NEOPLASIA (LOBULAR CARCINOMA IN SITU) - FIBROCYSTIC CHANGES WITH ADENOSIS AND CALCIFICATIONS. - THE SURGICAL RESECTION MARGINS ARE NEGATIVE FOR DUCTAL CARCINOMA. - THERE IS NO EVIDENCE OF CARCINOMA IN 1 OF 1 LYMPH NODE (0/1).  Diagnosis 06/20/2015  Breast, left, needle core biopsy - INVASIVE DUCTAL CARCINOMA WITH EXTRACELLULAR MUCIN. - SEE COMMENT. Microscopic Comment The carcinoma appears grade 2. A breast prognostic profile will be performed and the results reported separately. The results were called to [facility] on 06/21/15. (JBK:kh 06/21/15) Results: IMMUNOHISTOCHEMICAL AND MORPHOMETRIC ANALYSIS PERFORMED MANUALLY Estrogen Receptor: 100%, POSITIVE, STRONG STAINING INTENSITY Progesterone Receptor: 0%, NEGATIVE Proliferation Marker Ki67: 30%  Results: HER2 - NEGATIVE RATIO OF HER2/CEP17 SIGNALS 1.08 AVERAGE HER2 COPY NUMBER PER CELL 1.30  Diagnosis 07/05/2015 1. Breast, simple mastectomy, Left INVASIVE DUCTAL CARCINOMA WITH EXTRACELLULAR MUCIN, GRADE 2, SPINNING 2.0 CM THE CARCINOMA IS BROADLY PRESENTED LESS THAN 1 MM FROM THE DEEP MARGIN LOBULAR NEOPLASIA (LOBUAR CARCINOMA IN SITU) FIBROCYSTIC CHANGES WITH ADENOSIS AND CALCIFICATIONS 2. Lymph node, sentinel, biopsy, Left axillary ONE BENIGN LYMPH NODE (0/1) 3. Breast, excision, Left additional anterior margin over tumor BENIGN BREAST TISSUE, NEGATIVE FOR CARCINOMA 4. Lymph node, sentinel, biopsy, Left  axillary ONE BENIGN LYMPH NODE (0/1) 5. Lymph node, sentinel, biopsy, Left axillary ONE BENIGN LYMPH NODE (0/1)  Microscopic Comment 1. BREAST, INVASIVE TUMOR, WITH LYMPH NODES PRESENT Specimen, including laterality and lymph node sampling (sentinel, non-sentinel): Left breast, sentinel lymph nodes Procedure: Simple mastectomy Histologic type: Ductal carcinoma Grade: 2 Tubule formation: 3 Nuclear pleomorphism: 1 Mitotic:2 Tumor size (gross measurement or glass slide measurement): 2.0 cm Margins: Invasive, distance to closest margin: Less than 1 mm from the deep margin In-situ, distance to closest margin:  0.4cm from the anteriro margin If margin positive, focally or broadly: Broadly Lymphovascular invasion: Negative Ductal carcinoma in situ: Present Grade: 2 Extensive intraductal component: moderate Lobular neoplasia: Present Tumor focality: Focal Treatment effect: Negative If present, treatment effect in breast tissue, lymph nodes or both: NA Extent of tumor: Skin: Negative Nipple: Negative Skeletal muscle: Negative Lymph nodes: Examined: 3 Sentinel 0 Non-sentinel 0 Total Lymph nodes with metastasis: 0 Isolated tumor cells (< 0.2 mm): NA Micrometastasis: (> 0.2 mm and < 2.0 mm): NA Macrometastasis: (> 2.0 mm): NA Extracapsular extension: NA Breast prognostic profile: [id number] Estrogen receptor: 100% Progesterone receptor: 0% Her 2 neu: Negative Ki-67: 30% Non-neoplastic breast: Fibrocystic changes with adenosis and calcifications TNM: pT2, pN0 Comments: PR and Her has been reorded.  1. PROGNOSTIC INDICATORS Results: IMMUNOHISTOCHEMICAL AND MORPHOMETRIC ANALYSIS PERFORMED MANUALLY Progesterone Receptor: 0%, NEGATIVE  Results: HER2 - NEGATIVE RATIO OF HER2/CEP17 SIGNALS 1.21 AVERAGE HER2 COPY NUMBER PER CELL 1.75  ONCOTYPE DX RS 28 (intermediate risk), which predicts 10 year risk of distant recurrence 18% with tamoxifen alone  RADIOGRAPHIC STUDIES: I have  personally reviewed the radiological images as listed and agreed with the findings in the report.  Bone density scan 06/28/2013  Osteopenia T-score -2.1   Diagnostic mammogram and ultrasound of the left breast and axilla 06/19/2015 Impression: Suspicious of malignancy The 1.3 cm oval solid mass with an indistinct margin in the upper inner quadrant posterior depth of the left breast is suspicious of malignancy. An ultrasound-guided biopsy is recommended.  ASSESSMENT:  65 yo female   1.  Breast cancer of upper inner quadrant of left breast,  Invasive ductal carcinoma, pT2N0M0, stage IIA, G2, ER+/PR-/HER2-, ONCOTYPE RS 28  --I discussed her surgical path result in details -the Oncotype Dx result was reviewed with her in details. She has intermedia risk based on the recurrence score, which predicts 10 year distant recurrence after 5 years of tamoxifen 18%. The benefit of chemotherapy in the intermedia risk group is small and controversial. The predicted benefit from chemotherapy to reduce her risk of distant recurrence in her case is probably around 4% in 10-years. Given her  other medical comorbidities, and her reluctance to take chemotherapy, we decided not to pursue adjuvant chemotherapy, after a long discussion with patient and her husband.  -Giving the strong ER and PR expression in her tumor and her postmenopausal status, I recommend adjuvant endocrine therapy with  aromatase inhibitor for 5-10 years. -She tried exemestane first, but developed skin rash after months. Skin rash has resolved after stopped exemestane. -She has been tolerating anastrozole well, we'll continue, plan for 5-10 years.  -We'll continue breast cancer surveillance, she had bilateral mastectomy, no need mammogram. I'll continue following her with lab and exam.  -Lab results reviewed with her, her renal issue has much improved. Anemia stable. -She is clinically doing well, exam was unremarkable today, no concerns of  recurrence.  2. extensive right breast DCIS, >10cm, high grade ER/PR negative - this is cured by mastectomy.   3 Bone health Her last DEXA scan in 2015 showed osteopenia. -She has repeated DEXA scan at her primary care physician's office earlier this year, I'll try to get a copy -I encouraged her to continue calcium and vitamin D replacement  4. Arthritis, chronic back pain, hypertension, CKD stage III  -She will continue follow-up with her primary care physician  -Her renal function has improved lately.  5. Anemia -Possibly anemia of chronic disease secondary to CKD -I'll check ret, her iron studies, B12,  folic acid today, results pending   Plan -Continue anastrozole 1 mg once daily -I'll see her back in 3 months with lab -I'll get a copy of her recent bone density scan from Dr. Brigitte Pulse    All questions were answered. The patient knows to call the clinic with any problems, questions or concerns.  I spent 20 minutes counseling the patient face to face. The total time spent in the appointment was 25 minutes and more than 50% was on counseling.     Connie Merle, MD 11/30/15 11:21 AM   Addendum Received bone density scan from Dr. Raul Del office, done on 09/05/2015 Diagnosis, osteopenia, T score of -2.2 at left femoral neck 10 year probability of fracture: Major osteoporotic fracture 12.3%, hip fracture 2.3% No significant change from 05/2013

## 2015-12-01 LAB — VITAMIN B12: Vitamin B12: 1541 pg/mL — ABNORMAL HIGH (ref 211–946)

## 2015-12-03 LAB — FOLATE RBC
Folate, Hemolysate: 447.7 ng/mL
Folate, RBC: 1340 ng/mL (ref >498)
Hematocrit: 33.4 % — ABNORMAL LOW (ref 34.0–46.6)

## 2015-12-22 ENCOUNTER — Other Ambulatory Visit: Payer: Self-pay | Admitting: Hematology

## 2015-12-22 DIAGNOSIS — C50411 Malignant neoplasm of upper-outer quadrant of right female breast: Secondary | ICD-10-CM

## 2016-01-29 DIAGNOSIS — M5136 Other intervertebral disc degeneration, lumbar region: Secondary | ICD-10-CM | POA: Diagnosis not present

## 2016-01-29 DIAGNOSIS — G894 Chronic pain syndrome: Secondary | ICD-10-CM | POA: Diagnosis not present

## 2016-01-29 DIAGNOSIS — Z79899 Other long term (current) drug therapy: Secondary | ICD-10-CM | POA: Diagnosis not present

## 2016-01-29 DIAGNOSIS — Z9689 Presence of other specified functional implants: Secondary | ICD-10-CM | POA: Diagnosis not present

## 2016-01-29 DIAGNOSIS — M961 Postlaminectomy syndrome, not elsewhere classified: Secondary | ICD-10-CM | POA: Diagnosis not present

## 2016-01-29 DIAGNOSIS — Z5181 Encounter for therapeutic drug level monitoring: Secondary | ICD-10-CM | POA: Diagnosis not present

## 2016-02-26 DIAGNOSIS — H109 Unspecified conjunctivitis: Secondary | ICD-10-CM | POA: Diagnosis not present

## 2016-02-28 DIAGNOSIS — H109 Unspecified conjunctivitis: Secondary | ICD-10-CM | POA: Diagnosis not present

## 2016-02-29 DIAGNOSIS — H109 Unspecified conjunctivitis: Secondary | ICD-10-CM | POA: Diagnosis not present

## 2016-03-03 DIAGNOSIS — H109 Unspecified conjunctivitis: Secondary | ICD-10-CM | POA: Diagnosis not present

## 2016-03-07 DIAGNOSIS — C50911 Malignant neoplasm of unspecified site of right female breast: Secondary | ICD-10-CM | POA: Diagnosis not present

## 2016-03-07 DIAGNOSIS — C50212 Malignant neoplasm of upper-inner quadrant of left female breast: Secondary | ICD-10-CM | POA: Diagnosis not present

## 2016-03-10 DIAGNOSIS — H109 Unspecified conjunctivitis: Secondary | ICD-10-CM | POA: Diagnosis not present

## 2016-03-14 ENCOUNTER — Ambulatory Visit (HOSPITAL_BASED_OUTPATIENT_CLINIC_OR_DEPARTMENT_OTHER): Payer: PPO | Admitting: Hematology

## 2016-03-14 ENCOUNTER — Other Ambulatory Visit (HOSPITAL_BASED_OUTPATIENT_CLINIC_OR_DEPARTMENT_OTHER): Payer: PPO

## 2016-03-14 ENCOUNTER — Telehealth: Payer: Self-pay | Admitting: Hematology

## 2016-03-14 VITALS — BP 169/66 | HR 87 | Temp 98.0°F | Resp 18 | Ht 63.0 in | Wt 146.1 lb

## 2016-03-14 DIAGNOSIS — I10 Essential (primary) hypertension: Secondary | ICD-10-CM

## 2016-03-14 DIAGNOSIS — C50212 Malignant neoplasm of upper-inner quadrant of left female breast: Secondary | ICD-10-CM

## 2016-03-14 DIAGNOSIS — M1990 Unspecified osteoarthritis, unspecified site: Secondary | ICD-10-CM

## 2016-03-14 DIAGNOSIS — Z853 Personal history of malignant neoplasm of breast: Secondary | ICD-10-CM | POA: Diagnosis not present

## 2016-03-14 DIAGNOSIS — M549 Dorsalgia, unspecified: Secondary | ICD-10-CM

## 2016-03-14 DIAGNOSIS — N183 Chronic kidney disease, stage 3 (moderate): Secondary | ICD-10-CM

## 2016-03-14 DIAGNOSIS — C50411 Malignant neoplasm of upper-outer quadrant of right female breast: Secondary | ICD-10-CM

## 2016-03-14 DIAGNOSIS — D649 Anemia, unspecified: Secondary | ICD-10-CM | POA: Diagnosis not present

## 2016-03-14 DIAGNOSIS — Z170 Estrogen receptor positive status [ER+]: Secondary | ICD-10-CM

## 2016-03-14 LAB — COMPREHENSIVE METABOLIC PANEL
ALT: 22 U/L (ref 0–55)
AST: 28 U/L (ref 5–34)
Albumin: 3.7 g/dL (ref 3.5–5.0)
Alkaline Phosphatase: 82 U/L (ref 40–150)
Anion Gap: 8 mEq/L (ref 3–11)
BUN: 19.6 mg/dL (ref 7.0–26.0)
CO2: 25 mEq/L (ref 22–29)
Calcium: 10.1 mg/dL (ref 8.4–10.4)
Chloride: 110 mEq/L — ABNORMAL HIGH (ref 98–109)
Creatinine: 1.3 mg/dL — ABNORMAL HIGH (ref 0.6–1.1)
EGFR: 43 mL/min/{1.73_m2} — ABNORMAL LOW (ref >90)
Glucose: 118 mg/dl (ref 70–140)
Potassium: 4.2 mEq/L (ref 3.5–5.1)
Sodium: 144 mEq/L (ref 136–145)
Total Bilirubin: 0.3 mg/dL (ref 0.20–1.20)
Total Protein: 7.1 g/dL (ref 6.4–8.3)

## 2016-03-14 LAB — CBC WITH DIFFERENTIAL/PLATELET
BASO%: 0.3 % (ref 0.0–2.0)
Basophils Absolute: 0 10*3/uL (ref 0.0–0.1)
EOS%: 2.5 % (ref 0.0–7.0)
Eosinophils Absolute: 0.3 10*3/uL (ref 0.0–0.5)
HCT: 34 % — ABNORMAL LOW (ref 34.8–46.6)
HGB: 11.2 g/dL — ABNORMAL LOW (ref 11.6–15.9)
LYMPH%: 19.3 % (ref 14.0–49.7)
MCH: 30.1 pg (ref 25.1–34.0)
MCHC: 32.9 g/dL (ref 31.5–36.0)
MCV: 91.4 fL (ref 79.5–101.0)
MONO#: 0.7 10*3/uL (ref 0.1–0.9)
MONO%: 7.1 % (ref 0.0–14.0)
NEUT#: 7.1 10*3/uL — ABNORMAL HIGH (ref 1.5–6.5)
NEUT%: 70.8 % (ref 38.4–76.8)
Platelets: 217 10*3/uL (ref 145–400)
RBC: 3.72 10*6/uL (ref 3.70–5.45)
RDW: 13.4 % (ref 11.2–14.5)
WBC: 10 10*3/uL (ref 3.9–10.3)
lymph#: 1.9 10*3/uL (ref 0.9–3.3)

## 2016-03-14 NOTE — Progress Notes (Signed)
[place] Follow up Note   Patient Care Team: Mayra Neer, MD as PCP - General (Family Medicine) Holley Bouche, NP as Nurse Practitioner (Nurse Practitioner) [name], MD as Consulting Physician (General Surgery) Truitt Merle, MD as Consulting Physician (Hematology) Sylvan Cheese, NP as Nurse Practitioner (Hematology and Oncology) Crissie Reese, MD as Consulting Physician (Plastic Surgery)  CHIEF COMPLAINTS:  Follow up bilateral breast cancer   Oncology History   Breast cancer of upper-outer quadrant of RIGHT female breast [facility])   Staging form: Breast, AJCC 7th Edition Clinical stage from 09/06/2014: Stage 0 (Tis (DCIS), N0, M0)  Pathologic stage from 01/25/2015: Stage 0 (Tis (DCIS), N0, cM0)  ------  Breast cancer of upper-inner quadrant of LEFT female breast ([place])   Staging form: Breast, AJCC 7th Edition Clinical stage from 06/20/2015: Stage IA (T1c, N0, M0)      Pathologic stage from 07/05/2015: Stage IIA (T2, N0, cM0)      Breast cancer of upper-outer quadrant of right female breast ([place])   08/23/2014 Mammogram    Mammogram and ultrasound showed a 3cm cystic lesion at 8:30 of right breast, and a 7 mm and a 5 mm increased density lesion at 10:00 of the right breast.      08/29/2014 Initial Biopsy    Right breast core needle bx x 2: high-grade DCIS, with focus of mildly suspicious for early stromal invasion. ER- (0%), PR- (0%).       08/29/2014 Clinical Stage    Stage 0: Tis N0      09/13/2014 Surgery    Right lumpectomy/SLNB ([place]): High grade DCIS with necrosis and calcfications, positive margins. 5 LN removed and negative for malignancy (0/5). Grade 3.      09/13/2014 Pathologic Stage    Stage 0: Tis N0      09/29/2014 Surgery    Re-excision for positive margins; multiple breast margins re-excised, high-grade DCIS, measuring 1.0 cm, 3.0 cm, 2.0 cm, some margins are still positive or very close.       01/25/2015 Definitive Surgery     Right nipple-sparing mastectomy Barry Dienes): DCIS with calcifications, high grade, spans 9 cm, LCIS, fibrocystic changes with adenosis and calcifications, negative margins. 1 LN removed and negative. Imeediate reconstruction      03/23/2015 Survivorship    Survivorship care plan completed and mailed to patient in lieu of in person visit at request of patient.       Breast cancer of upper-inner quadrant of left female breast ([place])   06/28/2013 Imaging    DEXA scan: Osteopenia [name])      06/19/2015 Mammogram    Diagnostic mammogram and ultrasound showed a 1.3 cm in the upper inner quadrant of left breast, and a 0.9 mm simple cyst. Axilla was negative.      06/20/2015 Receptors her2    ER 100% positive, PR negative, Ki-67 30%, HER-2 negative      06/20/2015 Initial Biopsy    The left breast upper inner quadrant mass biopsy showed invasive ductal carcinoma, grade 2      06/20/2015 Initial Diagnosis    Breast cancer of upper-inner quadrant of left female breast ([place])      07/05/2015 Surgery    Left breast nipple-sparing mastectomy and sentinel lymph node biopsy with immediate reconstruction Barry Dienes); reconstruction with Dr. Harlow Mares at [facility]      07/05/2015 Pathology Results    Left mastectomy: Invasive ductal carcinoma with extracellular mucin, grade 2, spanning 2.0 cm, margins were negative, lobular carcinoma  in situ, 3 sentinel lymph nodes negative. PR repeated and remains negative. HER2 remains neg (ratio 1.21)      07/05/2015 Pathologic Stage    pT2, pN0: Stage IIA       07/05/2015 Oncotype testing    RS 28 (intermediate risk), which predicts 10 year risk of distant recurrence 18% with tamoxifen alone      07/30/2015 - 08/30/2015 Anti-estrogen oral therapy    Adjuvant exemestane 25 mg once daily, stopped after one month due to skin rash.       09/01/2015 -  Anti-estrogen oral therapy    Switched to anastrozole.  Planned duration of treatment : 5-10 years [facility])        HISTORY OF PRESENTING ILLNESS (09/06/2014):  Connie Bennett 65 y.o. female is here because of recent diagnosis of right breast DCIS  On her recent follow-up with her primary care physician, a lump in her right breat was found on exam, and she was sent for mammogram. Her diagnostic mammogram and ultrasound on 08/19/2014 showed a 3 cm cystic lesion, an additional 7 mm and a 5 mm increased density lesion at 10:00 of her right breast. She has right breast cysts before which was biopsied before. She underwent core needle biopsy on 08/29/2014, which showed high-grade DCIS. The aspiration of the cystic lesion showed no malignant cells on cytology.  She has chronic diffuse arthritis pain, she had a back surgery twice and has a stimulator for pain control. She is able to take care of herself, and does some light housework. She walks around with a cane. She denies any recent change of her energy level, appetite or weight.  In terms of breast cancer risk profile:  She menarched at early age of 52 and went to menopause at age 65 She had 1 pregnancy, her first child was born at age 38  She did not breast-fed her child.  She received birth control pills for approximately 15 years.  She was never exposed to fertility medications, but use hormone replacement therapy for 3-5 years  She has  family history of Breast/GYN/GI cancer  CURRENT THERAPY: Anastrozole 1 mg daily, started on 09/28/2015  INTERIM HISTORY: Connie Bennett returns for follow-up. She is doing very well overall. She is compliant with Anastrozole and tolerates it well. She has no significant hot flash, or arthralgia. She reports intermittent headaches for the past 3-4 days, no vision change or other neurological symptoms. She has not tried any medication for headaches. She otherwise doing very well without other complaints.    MEDICAL HISTORY:  Past Medical History:  Diagnosis Date  . Anxiety    takes Xanax daily as needed  . Arthritis   .  Bilateral lower extremity edema    takes Furosemide daily as needed  . Breast cancer [facility]) oncologist-  dr Truitt Merle--  right upper-outer quadrant    dx May 2016--- Stage 0  (Tis,N0,M0)  DCIS  Right breast---  09-13-2014  s/p  radioactive seed/ partial mastectomy / sln bx  . Chronic fatigue syndrome   . Chronic low back pain   . Complication of anesthesia    spinal cord stimulator- to be off for surgery  . Depression    takes Effexor daily  . Family history of adverse reaction to anesthesia    mom hard to wake up  . Fibromyalgia   . Headache(784.0)    couple of times a week  . Heart murmur   . History of bronchitis > 80yr ago  .  History of colon polyps    benign  . History of drug overdose    oxycontin  and oxycodone  Sept 2015--  now has narcan injection prescription  . History of DVT of lower extremity    2008--  BILATERAL  . History of hiatal hernia   . HTN (hypertension)    takes Amlodipine and Ramipril daily  . Hyperlipidemia    takes Zetia and Crestor  daily  . Joint pain   . Mitral valve prolapse    MILD /   PER PT ASYMPTOMATIC  . Muscle spasm    takes Zanaflex daily as needed  . Osteoporosis   . Peripheral neuropathy (HCC)    takes Neurontin daily  . Peripheral vascular disease (HCC)    hx dvt's  . RSD (reflex sympathetic dystrophy)    left wrist and forearm from a fx  . RSD (reflex sympathetic dystrophy)   . Sjogren's disease ([place])   . Vertigo    takes Meclizine daily     SURGICAL HISTORY: Past Surgical History:  Procedure Laterality Date  . BACK SURGERY    . BREAST RECONSTRUCTION WITH PLACEMENT OF TISSUE EXPANDER AND FLEX HD (ACELLULAR HYDRATED DERMIS) Right 01/25/2015   Procedure: RIGHT BREAST RECONSTRUCTION WITH PLACEMENT OF IMPLANT AND ACELLULAR DERMAL MATRIX ;  Surgeon: Crissie Reese, MD;  Location: [place];  Service: Plastics;  Laterality: Right;  . BREAST RECONSTRUCTION WITH PLACEMENT OF TISSUE EXPANDER AND FLEX HD (ACELLULAR HYDRATED DERMIS) Left  07/05/2015   Procedure: LEFT BREAST RECONSTRUCTION WITH PLACEMENT OF TISSUE EXPANDER AND  ACELLULAR DERMAL MATRIX ;  Surgeon: Crissie Reese, MD;  Location: [place];  Service: Plastics;  Laterality: Left;  . COLONOSCOPY    . ESOPHAGOGASTRODUODENOSCOPY    . eye lid surgery Bilateral   . FOOT SURGERY Bilateral 1996   MORTON'S NEUROMA  . INNER EAR SURGERY Right   . LUMBAR DISC SURGERY    . MASTECTOMY Right 01/25/2015   nipple sparing   . NASAL SINUS SURGERY    . NIPPLE SPARING MASTECTOMY/SENTINAL LYMPH NODE BIOPSY/RECONSTRUCTION/PLACEMENT OF TISSUE EXPANDER Left 07/05/2015   Procedure: LEFT NIPPLE SPARING MASTECTOMY WITH SENTINAL LYMPH NODE BIOPSY ;  Surgeon: [name], MD;  Location: [place];  Service: General;  Laterality: Left;  . ORIF LEFT WRIST FX'S/  LEFT CARPAL TUNNEL RELEASE  1999  . POSTERIOR LUMBAR FUSION  01-30-2009   L4--5 Laminectmy w/ decompression/  bilateral L4--5 microdiskectomy and fusion  . RADIOACTIVE SEED GUIDED MASTECTOMY WITH AXILLARY SENTINEL LYMPH NODE BIOPSY Right 09/13/2014   Procedure: RADIOACTIVE SEED GUIDED PARTIAL MASTECTOMY WITH AXILLARY SENTINEL LYMPH NODE BIOPSY;  Surgeon: [name], MD;  Location: [place];  Service: General;  Laterality: Right;  . RE-EXCISION OF BREAST LUMPECTOMY Right 09/29/2014   Procedure: RE-EXCISION OF BREAST LUMPECTOMY;  Surgeon: [name], MD;  Location: [place];  Service: General;  Laterality: Right;  . RIGHT CARPAL TUNNEL RELEASE/  TRIGGER RELEASE RIGHT MIDDLE FINGER  06-18-2006  . RIGHT INFERIOR PARATHYROIDECTOMY  04-07-2006  . SIMPLE MASTECTOMY WITH AXILLARY SENTINEL NODE BIOPSY Right 01/25/2015   Procedure: RIGHT NIPPLE SPARING MASTECTOMY;  Surgeon: [name], MD;  Location: [place];  Service: General;  Laterality: Right;  . SPINAL CORD STIMULATOR IMPLANT  2013  . TONSILLECTOMY  as child    SOCIAL HISTORY: Social History   Social History  . Marital status: Married    Spouse name: N/A  .  Number of children: 1  . Years of education: N/A  Occupational History  . unemployed Disabled    Probation officer  . disabled    Social History Main Topics  . Smoking status: Former Smoker    Packs/day: 1.00    Years: 25.00    Types: Cigarettes    Quit date: 09/29/2014  . Smokeless tobacco: Never Used     Comment: quit smoking Jan 2016  . Alcohol use 0.0 oz/week     Comment: occ  . Drug use: No  . Sexual activity: Yes    Birth control/ protection: Post-menopausal   Other Topics Concern  . Not on file   Social History Narrative  . No narrative on file    FAMILY HISTORY: Family History  Problem Relation Age of Onset  . Heart murmur Mother   . Cancer Mother 69    carcinoid tumor   . Heart murmur Sister     x2  . Cancer Maternal Aunt     lung cancer  . Cancer Paternal Aunt     breast cancer   . Cancer Maternal Aunt     gastric cancer   . Cancer Cousin     lung cancer  . Cancer Cousin     bone cancer   . Heart attack Maternal Uncle   . Heart attack Paternal Uncle   . Hypertension Mother   . Hypertension Son   . Stroke Neg Hx     ALLERGIES:  is allergic to carbocaine [mepivacaine hcl]; penicillins; sulfa antibiotics; amlodipine; and latex.  MEDICATIONS:  Current Outpatient Prescriptions  Medication Sig Dispense Refill  . alendronate (FOSAMAX) 70 MG tablet Take 70 mg by mouth every 7 (seven) days.    . ALPRAZolam (XANAX) 0.5 MG tablet Take 0.5-1 mg by mouth 2 (two) times daily as needed for anxiety. Take 1 tablet (0.5 mg) by mouth if needed during the day, and 2 tablets (1 mg) if needed at bedtime    . anastrozole (ARIMIDEX) 1 MG tablet TAKE 1 TABLET (1 MG TOTAL) BY MOUTH DAILY. 30 tablet 2  . Ascorbic Acid (VITAMIN C) 1000 MG tablet Take 1,000 mg by mouth daily.    . calcium carbonate (OS-CAL) 600 MG TABS tablet Take 600 mg by mouth daily with breakfast.    . chlorpheniramine-HYDROcodone (TUSSIONEX) 10-8 MG/5ML SUER Take 5 mLs by mouth every 12 (twelve) hours as  needed for cough. 140 mL 0  . ezetimibe (ZETIA) 10 MG tablet Take 10 mg by mouth at bedtime.     . furosemide (LASIX) 40 MG tablet Take 40 mg by mouth daily as needed for fluid or edema (leg swelling). Reported on 06/29/2015  1  . gabapentin (NEURONTIN) 600 MG tablet Take 600 mg by mouth 3 (three) times daily.      . Lactobacillus (PROBIOTIC ACIDOPHILUS PO) Take 1 tablet by mouth every morning.    . meclizine (ANTIVERT) 25 MG tablet Take 25 mg by mouth 3 (three) times daily as needed for dizziness (vertigo). Reported on 06/29/2015    . methocarbamol (ROBAXIN) 500 MG tablet Take 1 tablet (500 mg total) by mouth every 6 (six) hours as needed for muscle spasms. 40 tablet 1  . OVER THE COUNTER MEDICATION Take 1 drop by mouth daily as needed (dry eyes). OTC lubricating eye drops    . potassium chloride SA (K-DUR,KLOR-CON) 20 MEQ tablet Take 20 mEq by mouth daily as needed (with furosemide (lasix) dose).    . ramipril (ALTACE) 10 MG capsule Take 10 mg by mouth daily.    Marland Kitchen  rosuvastatin (CRESTOR) 20 MG tablet Take 20 mg by mouth daily.    . traZODone (DESYREL) 100 MG tablet Take 300 mg by mouth daily as needed.    . venlafaxine (EFFEXOR-XR) 150 MG 24 hr capsule Take 150 mg by mouth 2 (two) times daily. Take 1 capsule (150 mg) by mouth daily with breakfast and lunch     No current facility-administered medications for this visit.     REVIEW OF SYSTEMS:   Constitutional: Denies fevers, chills or abnormal night sweats Eyes: Denies blurriness of vision, double vision or watery eyes Ears, nose, mouth, throat, and face: Denies mucositis or sore throat Respiratory: Denies cough, dyspnea or wheezes Cardiovascular: Denies palpitation, chest discomfort or lower extremity swelling Gastrointestinal:  Denies nausea, heartburn or change in bowel habits Skin: Denies abnormal skin rashes Lymphatics: Denies new lymphadenopathy or easy bruising Neurological:Denies numbness, tingling or new  weaknesses Behavioral/Psych: Mood is stable, no new changes  All other systems were reviewed with the patient and are negative.  PHYSICAL EXAMINATION: ECOG PERFORMANCE STATUS: 1 - Symptomatic but completely ambulatory  Vitals:   03/14/16 1352  BP: (!) 169/66  Pulse: 87  Resp: 18  Temp: 98 F (36.7 C)   Filed Weights   03/14/16 1352  Weight: 146 lb 1.6 oz (66.3 kg)    GENERAL:alert, no distress and comfortable SKIN: skin color, texture, turgor are normal, no rashes or significant lesions EYES: normal, conjunctiva are pink and non-injected, sclera clear OROPHARYNX:no exudate, no erythema and lips, buccal mucosa, and tongue normal  NECK: supple, thyroid normal size, non-tender, without nodularity LYMPH:  no palpable lymphadenopathy in the cervical, axillary or inguinal LUNGS: clear to auscultation and percussion with normal breathing effort HEART: regular rate & rhythm and no murmurs and no lower extremity edema ABDOMEN:abdomen soft, non-tender and normal bowel sounds Musculoskeletal:no cyanosis of digits and no clubbing  PSYCH: alert & oriented x 3 with fluent speech NEURO: no focal motor/sensory deficits Breasts: s/p bilatera nipple sparing mastectomy and implant placement, surgical incision in right breast has well-healed, the interest staging in left breast is healing well. No discharge or skin erythema. Palpitation of both breasts reviewed no palpable mass or axillary adenopathy.    LABORATORY DATA:  I have reviewed the data as listed CBC Latest Ref Rng & Units 03/14/2016 11/30/2015 11/30/2015  WBC 3.9 - 10.3 10e3/uL 10.0 8.5 -  Hemoglobin 11.6 - 15.9 g/dL 11.2(L) 10.9(L) -  Hematocrit 34.8 - 46.6 % 34.0(L) 33.5(L) 33.4(L)  Platelets 145 - 400 10e3/uL 217 208 -    CMP Latest Ref Rng & Units 03/14/2016 11/30/2015 09/28/2015  Glucose 70 - 140 mg/dl 118 103 85  BUN 7.0 - 26.0 mg/dL 19.6 13.5 14  Creatinine 0.6 - 1.1 mg/dL 1.3(H) 1.1 1.48(H)  Sodium 136 - 145 mEq/L 144 144 141   Potassium 3.5 - 5.1 mEq/L 4.2 4.3 4.5  Chloride 101 - 111 mmol/L - - 105  CO2 22 - 29 mEq/L _0 Calcium 8.4 - 10.4 mg/dL 10.1 9.8 10.1  Total Protein 6.4 - 8.3 g/dL 7.1 6.8 -  Total Bilirubin 0.20 - 1.20 mg/dL 0.30 <0.30 -  Alkaline Phos 40 - 150 U/L 82 71 -  AST 5 - 34 U/L 28 26 -  ALT 0 - 55 U/L 22 18 -    PATHOLOGY REPORT  Diagnosis 09/13/2014 1. Breast, lumpectomy, Right - HIGH GRADE DUCTAL CARCINOMA IN SITU WITH NECROSIS AND CALCIFICATIONS, SEE COMMENT. - TUMOR IS FOCALLY PRESENT AT LATERAL MARGIN. -  TUMOR IS BROADLY PRESENT AT MEDIAL MARGIN. - TUMOR IS 1 MM FROM NEAREST ANTERIOR, POSTERIOR, AND INFERIOR MARGIN. - PREVIOUS BIOPSY SITE. - SEE TUMOR SYNOPTIC TEMPLATE BELOW. 2. Lymph node, sentinel, biopsy, Right axilla #1 - ONE LYMPH NODE, NEGATIVE FOR TUMOR (0/1). 3. Lymph node, sentinel, biopsy, Right axilla #2 - ONE LYMPH NODE, NEGATIVE FOR TUMOR (0/1). 4. Lymph node, sentinel, biopsy, Right axilla #3 - ONE LYMPH NODE, NEGATIVE FOR TUMOR (0/1). 5. Lymph node, sentinel, biopsy, Right axilla #4 - ONE LYMPH NODE, NEGATIVE FOR TUMOR (0/1). 6. Lymph node, sentinel, biopsy, Right axilla #5 - ONE LYMPH NODE, NEGATIVE FOR TUMOR (0/1).  Diagnosis 09/29/2014 1. Breast, excision, new anterior margin (right) - FIBROCYSTIC CHANGES WITH CALCIFICATIONS. - BIOPSY SITE REACTION. - NO MALIGNANCY IDENTIFIED. 2. Breast, excision, new medial margin (right) - HIGH GRADE DUCTAL CARCINOMA IN SITU, 1.0 CM IN GREATEST DIMENSION. - DUCTAL CARCINOMA IN SITU FOCALLY INVOLVES LATERAL MARGIN. - BIOPSY SITE REACTION. 3. Breast, excision, new inferior margin (right) - HIGH GRADE DUCTAL CARCINOMA IN SITU, 3.0 CM IN GREATEST DIMENSION. - DUCTAL CARCINOMA IN SITU FOCALLY LESS THAN 0.1 CM FROM POSTERIOR, INFERIOR, AND LATERAL MARGINS. - BIOPSY SITE REACTION. - SEE ONCOLOGY TABLE BELOW. 4. Breast, excision, new posterior margin (right) - FIBROCYSTIC CHANGES WITH CALCIFICATIONS. - NO MALIGNANCY  IDENTIFIED. - BIOPSY SITE REACTION. 5. Breast, excision, new lateral margin (right) - HIGH GRADE DUCTAL CARCINOMA IN SITU, 2.0 CM IN GREATEST DIMENSION. - DUCTAL CARCINOMA IN SITU FOCALLY INVOLVES LATERAL MARGIN. - DUCTAL CARCINOMA IN SITU FOCALLY LESS THAN 0.1 CM FROM POSTERIOR MARGIN.   Diagnosis Breast, simple mastectomy, right 01/25/2015  - DUCTAL CARCINOMA IN SITU WITH CALCIFICATIONS, HIGH GRADE, SPANNING 9.0 CM. - LOBULAR NEOPLASIA (LOBULAR CARCINOMA IN SITU) - FIBROCYSTIC CHANGES WITH ADENOSIS AND CALCIFICATIONS. - THE SURGICAL RESECTION MARGINS ARE NEGATIVE FOR DUCTAL CARCINOMA. - THERE IS NO EVIDENCE OF CARCINOMA IN 1 OF 1 LYMPH NODE (0/1).  Diagnosis 06/20/2015  Breast, left, needle core biopsy - INVASIVE DUCTAL CARCINOMA WITH EXTRACELLULAR MUCIN. - SEE COMMENT. Microscopic Comment The carcinoma appears grade 2. A breast prognostic profile will be performed and the results reported separately. The results were called to [facility] on 06/21/15. (JBK:kh 06/21/15) Results: IMMUNOHISTOCHEMICAL AND MORPHOMETRIC ANALYSIS PERFORMED MANUALLY Estrogen Receptor: 100%, POSITIVE, STRONG STAINING INTENSITY Progesterone Receptor: 0%, NEGATIVE Proliferation Marker Ki67: 30%  Results: HER2 - NEGATIVE RATIO OF HER2/CEP17 SIGNALS 1.08 AVERAGE HER2 COPY NUMBER PER CELL 1.30  Diagnosis 07/05/2015 1. Breast, simple mastectomy, Left INVASIVE DUCTAL CARCINOMA WITH EXTRACELLULAR MUCIN, GRADE 2, SPINNING 2.0 CM THE CARCINOMA IS BROADLY PRESENTED LESS THAN 1 MM FROM THE DEEP MARGIN LOBULAR NEOPLASIA (LOBUAR CARCINOMA IN SITU) FIBROCYSTIC CHANGES WITH ADENOSIS AND CALCIFICATIONS 2. Lymph node, sentinel, biopsy, Left axillary ONE BENIGN LYMPH NODE (0/1) 3. Breast, excision, Left additional anterior margin over tumor BENIGN BREAST TISSUE, NEGATIVE FOR CARCINOMA 4. Lymph node, sentinel, biopsy, Left axillary ONE BENIGN LYMPH NODE (0/1) 5. Lymph node, sentinel, biopsy, Left  axillary ONE BENIGN LYMPH NODE (0/1)  Microscopic Comment 1. BREAST, INVASIVE TUMOR, WITH LYMPH NODES PRESENT Specimen, including laterality and lymph node sampling (sentinel, non-sentinel): Left breast, sentinel lymph nodes Procedure: Simple mastectomy Histologic type: Ductal carcinoma Grade: 2 Tubule formation: 3 Nuclear pleomorphism: 1 Mitotic:2 Tumor size (gross measurement or glass slide measurement): 2.0 cm Margins: Invasive, distance to closest margin: Less than 1 mm from the deep margin In-situ, distance to closest margin: 0.4cm from the anteriro margin If margin positive, focally or broadly: Broadly Lymphovascular invasion: Negative Ductal  carcinoma in situ: Present Grade: 2 Extensive intraductal component: moderate Lobular neoplasia: Present Tumor focality: Focal Treatment effect: Negative If present, treatment effect in breast tissue, lymph nodes or both: NA Extent of tumor: Skin: Negative Nipple: Negative Skeletal muscle: Negative Lymph nodes: Examined: 3 Sentinel 0 Non-sentinel 0 Total Lymph nodes with metastasis: 0 Isolated tumor cells (< 0.2 mm): NA Micrometastasis: (> 0.2 mm and < 2.0 mm): NA Macrometastasis: (> 2.0 mm): NA Extracapsular extension: NA Breast prognostic profile: [id number] Estrogen receptor: 100% Progesterone receptor: 0% Her 2 neu: Negative Ki-67: 30% Non-neoplastic breast: Fibrocystic changes with adenosis and calcifications TNM: pT2, pN0 Comments: PR and Her has been reorded.  1. PROGNOSTIC INDICATORS Results: IMMUNOHISTOCHEMICAL AND MORPHOMETRIC ANALYSIS PERFORMED MANUALLY Progesterone Receptor: 0%, NEGATIVE  Results: HER2 - NEGATIVE RATIO OF HER2/CEP17 SIGNALS 1.21 AVERAGE HER2 COPY NUMBER PER CELL 1.75  ONCOTYPE DX RS 28 (intermediate risk), which predicts 10 year risk of distant recurrence 18% with tamoxifen alone  RADIOGRAPHIC STUDIES: I have personally reviewed the radiological images as listed and agreed with the  findings in the report.  Bone density scan 06/28/2013  Osteopenia T-score -2.1   Diagnostic mammogram and ultrasound of the left breast and axilla 06/19/2015 Impression: Suspicious of malignancy The 1.3 cm oval solid mass with an indistinct margin in the upper inner quadrant posterior depth of the left breast is suspicious of malignancy. An ultrasound-guided biopsy is recommended.  ASSESSMENT:  65 y.o.  female   1.  Breast cancer of upper inner quadrant of left breast,  Invasive ductal carcinoma, pT2N0M0, stage IIA, G2, ER+/PR-/HER2-, ONCOTYPE RS 28  --I previously discussed her surgical path result in details -the Oncotype Dx result was reviewed with her in details. She has intermedia risk based on the recurrence score, which predicts 10 year distant recurrence after 5 years of tamoxifen 18%. The benefit of chemotherapy in the intermedia risk group is small and controversial. The predicted benefit from chemotherapy to reduce her risk of distant recurrence in her case is probably around 4% in 10-years. Given her  other medical comorbidities, and her reluctance to take chemotherapy, we decided not to pursue adjuvant chemotherapy, after a long discussion with patient and her husband.  -Giving the strong ER and PR expression in her tumor and her postmenopausal status, I recommend adjuvant endocrine therapy with  aromatase inhibitor for 5-10 years. -She tried exemestane first, but developed skin rash after months. Skin rash has resolved after stopped exemestane. -She has been tolerating anastrozole well, we'll continue, plan for 5-10 years.  -We'll continue breast cancer surveillance, she had bilateral mastectomy, no need mammogram. I'll continue following her with lab and exam.  -Lab results reviewed with her, mild and stable anemia and CKD  -She is clinically doing well, exam was unremarkable today, no concerns of recurrence.  2. extensive right breast DCIS, >10cm, high grade ER/PR negative -  this is cured by mastectomy.   3 Bone health Her last DEXA scan in 2015 showed osteopenia. -She has repeated DEXA scan at her primary care physician's office earlier this year, I'll try to get a copy -I encouraged her to continue calcium and vitamin D replacement  4. Arthritis, chronic back pain, hypertension, CKD stage III  -She will continue follow-up with her primary care physician  -Her renal function is stable overall  5. Anemia -Possibly anemia of chronic disease secondary to CKD -Her iron study, folic acid, and B55 levels were normal in September 2017.  Plan -Continue anastrozole 1 mg once daily -I'll  see her back in 4 months with lab, she will see Dr. Barry Dienes in 8 months   All questions were answered. The patient knows to call the clinic with any problems, questions or concerns.  I spent 20 minutes counseling the patient face to face. The total time spent in the appointment was 25 minutes and more than 50% was on counseling.     Truitt Merle, MD 03/14/16 7:54 AM   Addendum Received bone density scan from Dr. Raul Del office, done on 09/05/2015 Diagnosis, osteopenia, T score of -2.2 at left femoral neck 10 year probability of fracture: Major osteoporotic fracture 12.3%, hip fracture 2.3% No significant change from 05/2013

## 2016-03-14 NOTE — Telephone Encounter (Signed)
Appointments scheduled per 12/15 LOS. Patient given AVS report and calendars with future scheduled appointments. °

## 2016-03-16 ENCOUNTER — Encounter: Payer: Self-pay | Admitting: Hematology

## 2016-03-28 ENCOUNTER — Other Ambulatory Visit: Payer: Self-pay | Admitting: Hematology

## 2016-03-28 DIAGNOSIS — C50411 Malignant neoplasm of upper-outer quadrant of right female breast: Secondary | ICD-10-CM

## 2016-04-09 DIAGNOSIS — I129 Hypertensive chronic kidney disease with stage 1 through stage 4 chronic kidney disease, or unspecified chronic kidney disease: Secondary | ICD-10-CM | POA: Diagnosis not present

## 2016-04-09 DIAGNOSIS — F322 Major depressive disorder, single episode, severe without psychotic features: Secondary | ICD-10-CM | POA: Diagnosis not present

## 2016-04-09 DIAGNOSIS — M797 Fibromyalgia: Secondary | ICD-10-CM | POA: Diagnosis not present

## 2016-04-09 DIAGNOSIS — N183 Chronic kidney disease, stage 3 (moderate): Secondary | ICD-10-CM | POA: Diagnosis not present

## 2016-04-09 DIAGNOSIS — E782 Mixed hyperlipidemia: Secondary | ICD-10-CM | POA: Diagnosis not present

## 2016-04-09 DIAGNOSIS — C50919 Malignant neoplasm of unspecified site of unspecified female breast: Secondary | ICD-10-CM | POA: Diagnosis not present

## 2016-04-09 DIAGNOSIS — G9050 Complex regional pain syndrome I, unspecified: Secondary | ICD-10-CM | POA: Diagnosis not present

## 2016-04-22 DIAGNOSIS — R809 Proteinuria, unspecified: Secondary | ICD-10-CM | POA: Diagnosis not present

## 2016-04-22 DIAGNOSIS — M961 Postlaminectomy syndrome, not elsewhere classified: Secondary | ICD-10-CM | POA: Diagnosis not present

## 2016-04-22 DIAGNOSIS — Z9689 Presence of other specified functional implants: Secondary | ICD-10-CM | POA: Diagnosis not present

## 2016-04-22 DIAGNOSIS — M5136 Other intervertebral disc degeneration, lumbar region: Secondary | ICD-10-CM | POA: Diagnosis not present

## 2016-05-14 DIAGNOSIS — F320 Major depressive disorder, single episode, mild: Secondary | ICD-10-CM | POA: Diagnosis not present

## 2016-06-05 ENCOUNTER — Other Ambulatory Visit: Payer: Self-pay | Admitting: *Deleted

## 2016-06-05 DIAGNOSIS — C50411 Malignant neoplasm of upper-outer quadrant of right female breast: Secondary | ICD-10-CM

## 2016-06-05 MED ORDER — ANASTROZOLE 1 MG PO TABS: 1.0000 mg | ORAL_TABLET | Freq: Every day | ORAL | 3 refills | Status: DC

## 2016-07-18 ENCOUNTER — Encounter: Payer: PPO | Admitting: Hematology

## 2016-07-18 ENCOUNTER — Other Ambulatory Visit: Payer: PPO

## 2016-07-20 NOTE — Progress Notes (Signed)
This encounter was created in error - please disregard.

## 2016-07-22 DIAGNOSIS — M5417 Radiculopathy, lumbosacral region: Secondary | ICD-10-CM | POA: Diagnosis not present

## 2016-07-22 DIAGNOSIS — G894 Chronic pain syndrome: Secondary | ICD-10-CM | POA: Diagnosis not present

## 2016-07-22 DIAGNOSIS — Z79899 Other long term (current) drug therapy: Secondary | ICD-10-CM | POA: Diagnosis not present

## 2016-07-22 DIAGNOSIS — Z5181 Encounter for therapeutic drug level monitoring: Secondary | ICD-10-CM | POA: Diagnosis not present

## 2016-07-22 DIAGNOSIS — M961 Postlaminectomy syndrome, not elsewhere classified: Secondary | ICD-10-CM | POA: Diagnosis not present

## 2016-07-22 DIAGNOSIS — M25551 Pain in right hip: Secondary | ICD-10-CM | POA: Diagnosis not present

## 2016-07-24 ENCOUNTER — Telehealth: Payer: Self-pay | Admitting: Hematology

## 2016-07-24 NOTE — Telephone Encounter (Signed)
Confirmed r/s appt for 5/17 at 130 per LOS

## 2016-08-13 ENCOUNTER — Telehealth: Payer: Self-pay | Admitting: Hematology

## 2016-08-13 NOTE — Telephone Encounter (Signed)
Connie Bennett called to cancel her appointment on 5/17 due to have server diarrhea and upset stomach I rescheduled her for 6/4 for labs and Dr at [facility]

## 2016-08-14 ENCOUNTER — Ambulatory Visit: Payer: PPO | Admitting: Hematology

## 2016-08-14 ENCOUNTER — Other Ambulatory Visit: Payer: PPO

## 2016-08-28 NOTE — Progress Notes (Signed)
[place] Follow up Note   Patient Care Team: Mayra Neer, MD as PCP - General (Family Medicine) Holley Bouche, NP as Nurse Practitioner (Nurse Practitioner) [name], MD as Consulting Physician (General Surgery) Truitt Merle, MD as Consulting Physician (Hematology) Sylvan Cheese, NP as Nurse Practitioner (Hematology and Oncology) Crissie Reese, MD as Consulting Physician (Plastic Surgery)  CHIEF COMPLAINTS:  Follow up bilateral breast cancer   Oncology History   Breast cancer of upper-outer quadrant of RIGHT female breast [facility])   Staging form: Breast, AJCC 7th Edition Clinical stage from 09/06/2014: Stage 0 (Tis (DCIS), N0, M0)  Pathologic stage from 01/25/2015: Stage 0 (Tis (DCIS), N0, cM0)  ------  Breast cancer of upper-inner quadrant of LEFT female breast ([place])   Staging form: Breast, AJCC 7th Edition Clinical stage from 06/20/2015: Stage IA (T1c, N0, M0)      Pathologic stage from 07/05/2015: Stage IIA (T2, N0, cM0)      Breast cancer of upper-outer quadrant of right female breast ([place])   08/23/2014 Mammogram    Mammogram and ultrasound showed a 3cm cystic lesion at 8:30 of right breast, and a 7 mm and a 5 mm increased density lesion at 10:00 of the right breast.      08/29/2014 Initial Biopsy    Right breast core needle bx x 2: high-grade DCIS, with focus of mildly suspicious for early stromal invasion. ER- (0%), PR- (0%).       08/29/2014 Clinical Stage    Stage 0: Tis N0      09/13/2014 Surgery    Right lumpectomy/SLNB ([place]): High grade DCIS with necrosis and calcfications, positive margins. 5 LN removed and negative for malignancy (0/5). Grade 3.      09/13/2014 Pathologic Stage    Stage 0: Tis N0      09/29/2014 Surgery    Re-excision for positive margins; multiple breast margins re-excised, high-grade DCIS, measuring 1.0 cm, 3.0 cm, 2.0 cm, some margins are still positive or very close.       01/25/2015 Definitive Surgery     Right nipple-sparing mastectomy Barry Dienes): DCIS with calcifications, high grade, spans 9 cm, LCIS, fibrocystic changes with adenosis and calcifications, negative margins. 1 LN removed and negative. Imeediate reconstruction      03/23/2015 Survivorship    Survivorship care plan completed and mailed to patient in lieu of in person visit at request of patient.       Breast cancer of upper-inner quadrant of left female breast ([place])   06/28/2013 Imaging    DEXA scan: Osteopenia [name])      06/19/2015 Mammogram    Diagnostic mammogram and ultrasound showed a 1.3 cm in the upper inner quadrant of left breast, and a 0.9 mm simple cyst. Axilla was negative.      06/20/2015 Receptors her2    ER 100% positive, PR negative, Ki-67 30%, HER-2 negative      06/20/2015 Initial Biopsy    The left breast upper inner quadrant mass biopsy showed invasive ductal carcinoma, grade 2      06/20/2015 Initial Diagnosis    Breast cancer of upper-inner quadrant of left female breast ([place])      07/05/2015 Surgery    Left breast nipple-sparing mastectomy and sentinel lymph node biopsy with immediate reconstruction Barry Dienes); [facility] with [facility] Harlow [facility]ares [facility]      07/05/2015 Pathology Results    Left mastectomy: Invasive ductal carcinoma with extracellular mucin, grade 2, spanning 2.0 cm, margins were negative, lobular carcinoma  in situ, 3 sentinel lymph nodes negative. PR repeated and remains negative. HER2 remains neg (ratio 1.21)      07/05/2015 Pathologic Stage    pT2, pN0: Stage IIA       07/05/2015 Oncotype testing    RS 28 (intermediate risk), which predicts 10 year risk of distant recurrence 18% with tamoxifen alone      07/30/2015 - 08/30/2015 Anti-estrogen oral therapy    Adjuvant exemestane 25 mg once daily, stopped after one month due to skin rash.       09/01/2015 -  Anti-estrogen oral therapy    Switched to anastrozole.  Planned duration of treatment : 5-10 years [facility])        HISTORY OF PRESENTING ILLNESS (09/06/2014):  Connie Bennett 66 y.o. female is here because of recent diagnosis of right breast DCIS  On her recent follow-up with her primary care physician, a lump in her right breat was found on exam, and she was sent for mammogram. Her diagnostic mammogram and ultrasound on 08/19/2014 showed a 3 cm cystic lesion, an additional 7 mm and a 5 mm increased density lesion at 10:00 of her right breast. She has right breast cysts before which was biopsied before. She underwent core needle biopsy on 08/29/2014, which showed high-grade DCIS. The aspiration of the cystic lesion showed no malignant cells on cytology.  She has chronic diffuse arthritis pain, she had a back surgery twice and has a stimulator for pain control. She is able to take care of herself, and does some light housework. She walks around with a cane. She denies any recent change of her energy level, appetite or weight.  In terms of breast cancer risk profile:  She menarched at early age of 18 and went to menopause at age 32 She had 1 pregnancy, her first child was born at age 73  She did not breast-fed her child.  She received birth control pills for approximately 15 years.  She was never exposed to fertility medications, but use hormone replacement therapy for 3-5 years  She has  family history of Breast/GYN/GI cancer  CURRENT THERAPY: Anastrozole 1 mg daily, started on 09/28/2015  INTERIM HISTORY: Harshika returns for follow-up. She presents to the clinic today with her husband. She reports that she has a pain on her left side rib cage area but has been improving. It has been going on for 3-4 weeks. Her husband reports at first it was bad to where she would scream form the pain. She takes a muscle relaxer and oxycodone. She has a pain stimulator in her back that her husband wonders if that may be causing her side pain. She had a bilateral mastectomy so she does not need a mammogram screening but  she does not remember her last bone density scan. She feels less nauseated when taking her anastrozole at night. She reports to have checked herself at home.  She reports to no change in urination.   MEDICAL HISTORY:  Past Medical History:  Diagnosis Date  . Anxiety    takes Xanax daily as needed  . Arthritis   . Bilateral lower extremity edema    takes Furosemide daily as needed  . Breast cancer [facility]) oncologist-  dr Truitt Merle--  right upper-outer quadrant    dx May 2016--- Stage 0  (Tis,N0,M0)  DCIS  Right breast---  09-13-2014  s/p  radioactive seed/ partial mastectomy / sln bx  . Chronic fatigue syndrome   . Chronic low back pain   .  Complication of anesthesia    spinal cord stimulator- to be off for surgery  . Depression    takes Effexor daily  . Family history of adverse reaction to anesthesia    mom hard to wake up  . Fibromyalgia   . Headache(784.0)    couple of times a week  . Heart murmur   . History of bronchitis > 18yr ago  . History of colon polyps    benign  . History of drug overdose    oxycontin  and oxycodone  Sept 2015--  now has narcan injection prescription  . History of DVT of lower extremity    2008--  BILATERAL  . History of hiatal hernia   . HTN (hypertension)    takes Amlodipine and Ramipril daily  . Hyperlipidemia    takes Zetia and Crestor  daily  . Joint pain   . Mitral valve prolapse    MILD /   PER PT ASYMPTOMATIC  . Muscle spasm    takes Zanaflex daily as needed  . Osteoporosis   . Peripheral neuropathy    takes Neurontin daily  . Peripheral vascular disease (HCC)    hx dvt's  . RSD (reflex sympathetic dystrophy)    left wrist and forearm from a fx  . RSD (reflex sympathetic dystrophy)   . Sjogren's disease ([place]   . Vertigo    takes Meclizine daily     SURGICAL HISTORY: Past Surgical History:  Procedure Laterality Date  . BACK SURGERY    . BREAST RECONSTRUCTION WITH PLACEMENT OF TISSUE EXPANDER AND FLEX HD (ACELLULAR HYDRATED  DERMIS) Right 01/25/2015   Procedure: RIGHT BREAST RECONSTRUCTION WITH PLACEMENT OF IMPLANT AND ACELLULAR DERMAL MATRIX ;  Surgeon: DCrissie Reese MD;  [place] [facility]  Service: Plastics;  Laterality: Right;  . BREAST RECONSTRUCTION WITH PLACEMENT OF TISSUE EXPANDER AND FLEX HD (ACELLULAR HYDRATED DERMIS) Left 07/05/2015   Procedure: LEFT BREAST RECONSTRUCTION WITH PLACEMENT OF TISSUE EXPANDER AND  ACELLULAR DERMAL MATRIX ;  Surgeon: DCrissie Reese MD;  [place] [facility]  Service: Plastics;  Laterality: Left;  . COLONOSCOPY    . ESOPHAGOGASTRODUODENOSCOPY    . eye lid surgery Bilateral   . FOOT SURGERY Bilateral 1996   MORTON'S NEUROMA  . INNER EAR SURGERY Right   . LUMBAR DISC SURGERY    . MASTECTOMY Right 01/25/2015   nipple sparing   . NASAL SINUS SURGERY    . NIPPLE SPARING MASTECTOMY/SENTINAL LYMPH NODE BIOPSY/RECONSTRUCTION/PLACEMENT OF TISSUE EXPANDER Left 07/05/2015   Procedure: LEFT NIPPLE SPARING MASTECTOMY WITH SENTINAL LYMPH NODE BIOPSY ;  Surgeon: FStark Klein MD;  [place] [facility]  Service: General;  Laterality: Left;  . ORIF LEFT WRIST FX'S/  LEFT CARPAL TUNNEL RELEASE  1999  . POSTERIOR LUMBAR FUSION  01-30-2009   L4--5 Laminectmy w/ decompression/  bilateral L4--5 microdiskectomy and fusion  . RADIOACTIVE SEED GUIDED MASTECTOMY WITH AXILLARY SENTINEL LYMPH NODE BIOPSY Right 09/13/2014   Procedure: RADIOACTIVE SEED GUIDED PARTIAL MASTECTOMY WITH AXILLARY SENTINEL LYMPH NODE BIOPSY;  Surgeon: FStark Klein MD;  [place] [facility]  Service: General;  Laterality: Right;  . RE-EXCISION OF BREAST LUMPECTOMY Right 09/29/2014   Procedure: RE-EXCISION OF BREAST LUMPECTOMY;  Surgeon: FStark Klein MD;  [place] [facility]  Service: General;  Laterality: Right;  . RIGHT CARPAL TUNNEL RELEASE/  TRIGGER RELEASE RIGHT MIDDLE FINGER  06-18-2006  . RIGHT INFERIOR PARATHYROIDECTOMY  04-07-2006  . SIMPLE MASTECTOMY WITH AXILLARY SENTINEL NODE BIOPSY Right  01/25/2015   Procedure:  RIGHT NIPPLE SPARING MASTECTOMY;  Surgeon: [name], MD;  Location: [place];  Service: General;  Laterality: Right;  . SPINAL CORD STIMULATOR IMPLANT  2013  . TONSILLECTOMY  as child    SOCIAL HISTORY: Social History   Social History  . Marital status: Married    Spouse name: N/A  . Number of children: 1  . Years of education: N/A   Occupational History  . unemployed Disabled    Probation officer  . disabled    Social History Main Topics  . Smoking status: Former Smoker    Packs/day: 1.00    Years: 25.00    Types: Cigarettes    Quit date: 09/29/2014  . Smokeless tobacco: Never Used     Comment: quit smoking Jan 2016  . Alcohol use 0.0 oz/week     Comment: occ  . Drug use: No  . Sexual activity: Yes    Birth control/ protection: Post-menopausal   Other Topics Concern  . Not on file   Social History Narrative  . No narrative on file    FAMILY HISTORY: Family History  Problem Relation Age of Onset  . Heart murmur Mother   . Cancer Mother 30       carcinoid tumor   . Heart murmur Sister        x2  . Cancer Maternal Aunt        lung cancer  . Cancer Paternal Aunt        breast cancer   . Cancer Maternal Aunt        gastric cancer   . Cancer Cousin        lung cancer  . Cancer Cousin        bone cancer   . Heart attack Maternal Uncle   . Heart attack Paternal Uncle   . Hypertension Mother   . Hypertension Son   . Stroke Neg Hx     ALLERGIES:  is allergic to carbocaine [mepivacaine hcl]; penicillins; sulfa antibiotics; amlodipine; and latex.  MEDICATIONS:  Current Outpatient Prescriptions  Medication Sig Dispense Refill  . ALPRAZolam (XANAX) 0.5 MG tablet Take 0.5-1 mg by mouth 2 (two) times daily as needed for anxiety. Take 1 tablet (0.5 mg) by mouth if needed during the day, and 2 tablets (1 mg) if needed at bedtime    . anastrozole (ARIMIDEX) 1 MG tablet Take 1 tablet (1 mg total) by mouth daily. 90 tablet 3  . Ascorbic Acid  (VITAMIN C) 1000 MG tablet Take 1,000 mg by mouth daily.    . calcium carbonate (OS-CAL) 600 MG TABS tablet Take 600 mg by mouth daily with breakfast.    . chlorpheniramine-HYDROcodone (TUSSIONEX) 10-8 MG/5ML SUER Take 5 mLs by mouth every 12 (twelve) hours as needed for cough. 140 mL 0  . ezetimibe (ZETIA) 10 MG tablet Take 10 mg by mouth at bedtime.     . furosemide (LASIX) 40 MG tablet Take 40 mg by mouth daily as needed for fluid or edema (leg swelling). Reported on 06/29/2015  1  . gabapentin (NEURONTIN) 600 MG tablet Take 600 mg by mouth 3 (three) times daily.      . Lactobacillus (PROBIOTIC ACIDOPHILUS PO) Take 1 tablet by mouth every morning.    . meclizine (ANTIVERT) 25 MG tablet Take 25 mg by mouth 3 (three) times daily as needed for dizziness (vertigo). Reported on 06/29/2015    . methocarbamol (ROBAXIN) 500 MG tablet Take 1 tablet (500 mg total) by mouth every 6 (  six) hours as needed for muscle spasms. 40 tablet 1  . OVER THE COUNTER MEDICATION Take 1 drop by mouth daily as needed (dry eyes). OTC lubricating eye drops    . potassium chloride SA (K-DUR,KLOR-CON) 20 MEQ tablet Take 20 mEq by mouth daily as needed (with furosemide (lasix) dose).    Marland Kitchen PROAIR HFA 108 (90 Base) MCG/ACT inhaler Inhale 2 puffs into the lungs every 4 (four) hours as needed.  2  . ramipril (ALTACE) 10 MG capsule Take 10 mg by mouth daily.    . rosuvastatin (CRESTOR) 20 MG tablet Take 20 mg by mouth daily.    Marland Kitchen venlafaxine (EFFEXOR-XR) 150 MG 24 hr capsule Take 150 mg by mouth 2 (two) times daily. Take 1 capsule (150 mg) by mouth daily with breakfast and lunch     No current facility-administered medications for this visit.     REVIEW OF SYSTEMS:   Constitutional: Denies fevers, chills (+) hot flashes Eyes: Denies blurriness of vision, double vision or watery eyes Ears, nose, mouth, throat, and face: Denies mucositis or sore throat Respiratory: Denies cough, dyspnea or wheezes Cardiovascular: Denies  palpitation, chest discomfort or lower extremity swelling Gastrointestinal:  Denies nausea, heartburn or change in bowel habits Skin: Denies abnormal skin rashes Lymphatics: Denies new lymphadenopathy or easy bruising Neurological:Denies numbness, tingling or new weaknesses MSK: (+) Left abdominal muscle pain and back pain Behavioral/Psych: Mood is stable, no new changes  All other systems were reviewed with the patient and are negative.  PHYSICAL EXAMINATION: ECOG PERFORMANCE STATUS: 1 - Symptomatic but completely ambulatory  Vitals:   09/01/16 1054  BP: (!) 120/50  Pulse: 76  Resp: 18  Temp: 98 F (36.7 C)   Filed Weights   09/01/16 1054  Weight: 140 lb 12.8 oz (63.9 kg)    GENERAL:alert, no distress and comfortable SKIN: skin color, texture, turgor are normal, no rashes or significant lesions EYES: normal, conjunctiva are pink and non-injected, sclera clear OROPHARYNX:no exudate, no erythema and lips, buccal mucosa, and tongue normal  NECK: supple, thyroid normal size, non-tender, without nodularity LYMPH:  no palpable lymphadenopathy in the cervical, axillary or inguinal LUNGS: clear to auscultation and percussion with normal breathing effort HEART: regular rate and no lower extremity edema (+) slight heart murmur  ABDOMEN:abdomen soft, non-tender and normal bowel sounds  (+) slight tenderness on the end of the left rib cage.   Musculoskeletal:no cyanosis of digits and no clubbing  PSYCH: alert & oriented x 3 with fluent speech NEURO: no focal motor/sensory deficits Breasts: s/p bilatera nipple sparing mastectomy and implant placement, surgical incision in right breast has well-healed, the interest staging in left breast has healed well.  Palpitation of both breasts reviewed no palpable mass or axillary adenopathy.    LABORATORY DATA:  I have reviewed the data as listed CBC Latest Ref Rng & Units 09/01/2016 03/14/2016 11/30/2015  WBC 3.9 - 10.3 10e3/uL 7.0 10.0 8.5   Hemoglobin 11.6 - 15.9 g/dL 10.4(L) 11.2(L) 10.9(L)  Hematocrit 34.8 - 46.6 % 31.6(L) 34.0(L) 33.5(L)  Platelets 145 - 400 10e3/uL 240 217 208    CMP Latest Ref Rng & Units 09/01/2016 03/14/2016 11/30/2015  Glucose 70 - 140 mg/dl 96 118 103  BUN 7.0 - 26.0 mg/dL 16.8 19.6 13.5  Creatinine 0.6 - 1.1 mg/dL 1.3(H) 1.3(H) 1.1  Sodium 136 - 145 mEq/L 146(H) 144 144  Potassium 3.5 - 5.1 mEq/L 4.2 4.2 4.3  Chloride 101 - 111 mmol/L - - -  CO2 22 -  29 mEq/L 28 25 26   Calcium 8.4 - 10.4 mg/dL 9.9 10.1 9.8  Total Protein 6.4 - 8.3 g/dL 6.7 7.1 6.8  Total Bilirubin 0.20 - 1.20 mg/dL <0.22 0.30 <0.30  Alkaline Phos 40 - 150 U/L 63 82 71  AST 5 - 34 U/L 29 28 26   ALT 0 - 55 U/L 23 22 18     PATHOLOGY REPORT  Diagnosis 09/13/2014 1. Breast, lumpectomy, Right - HIGH GRADE DUCTAL CARCINOMA IN SITU WITH NECROSIS AND CALCIFICATIONS, SEE COMMENT. - TUMOR IS FOCALLY PRESENT AT LATERAL MARGIN. - TUMOR IS BROADLY PRESENT AT MEDIAL MARGIN. - TUMOR IS 1 MM FROM NEAREST ANTERIOR, POSTERIOR, AND INFERIOR MARGIN. - PREVIOUS BIOPSY SITE. - SEE TUMOR SYNOPTIC TEMPLATE BELOW. 2. Lymph node, sentinel, biopsy, Right axilla #1 - ONE LYMPH NODE, NEGATIVE FOR TUMOR (0/1). 3. Lymph node, sentinel, biopsy, Right axilla #2 - ONE LYMPH NODE, NEGATIVE FOR TUMOR (0/1). 4. Lymph node, sentinel, biopsy, Right axilla #3 - ONE LYMPH NODE, NEGATIVE FOR TUMOR (0/1). 5. Lymph node, sentinel, biopsy, Right axilla #4 - ONE LYMPH NODE, NEGATIVE FOR TUMOR (0/1). 6. Lymph node, sentinel, biopsy, Right axilla #5 - ONE LYMPH NODE, NEGATIVE FOR TUMOR (0/1).  Diagnosis 09/29/2014 1. Breast, excision, new anterior margin (right) - FIBROCYSTIC CHANGES WITH CALCIFICATIONS. - BIOPSY SITE REACTION. - NO MALIGNANCY IDENTIFIED. 2. Breast, excision, new medial margin (right) - HIGH GRADE DUCTAL CARCINOMA IN SITU, 1.0 CM IN GREATEST DIMENSION. - DUCTAL CARCINOMA IN SITU FOCALLY INVOLVES LATERAL MARGIN. - BIOPSY SITE REACTION. 3. Breast,  excision, new inferior margin (right) - HIGH GRADE DUCTAL CARCINOMA IN SITU, 3.0 CM IN GREATEST DIMENSION. - DUCTAL CARCINOMA IN SITU FOCALLY LESS THAN 0.1 CM FROM POSTERIOR, INFERIOR, AND LATERAL MARGINS. - BIOPSY SITE REACTION. - SEE ONCOLOGY TABLE BELOW. 4. Breast, excision, new posterior margin (right) - FIBROCYSTIC CHANGES WITH CALCIFICATIONS. - NO MALIGNANCY IDENTIFIED. - BIOPSY SITE REACTION. 5. Breast, excision, new lateral margin (right) - HIGH GRADE DUCTAL CARCINOMA IN SITU, 2.0 CM IN GREATEST DIMENSION. - DUCTAL CARCINOMA IN SITU FOCALLY INVOLVES LATERAL MARGIN. - DUCTAL CARCINOMA IN SITU FOCALLY LESS THAN 0.1 CM FROM POSTERIOR MARGIN.   Diagnosis Breast, simple mastectomy, right 01/25/2015  - DUCTAL CARCINOMA IN SITU WITH CALCIFICATIONS, HIGH GRADE, SPANNING 9.0 CM. - LOBULAR NEOPLASIA (LOBULAR CARCINOMA IN SITU) - FIBROCYSTIC CHANGES WITH ADENOSIS AND CALCIFICATIONS. - THE SURGICAL RESECTION MARGINS ARE NEGATIVE FOR DUCTAL CARCINOMA. - THERE IS NO EVIDENCE OF CARCINOMA IN 1 OF 1 LYMPH NODE (0/1).  Diagnosis 06/20/2015  Breast, left, needle core biopsy - INVASIVE DUCTAL CARCINOMA WITH EXTRACELLULAR MUCIN. - SEE COMMENT. Microscopic Comment The carcinoma appears grade 2. A breast prognostic profile will be performed and the results reported separately. The results were called to [facility] on 06/21/15. (JBK:kh 06/21/15) Results: IMMUNOHISTOCHEMICAL AND MORPHOMETRIC ANALYSIS PERFORMED MANUALLY Estrogen Receptor: 100%, POSITIVE, STRONG STAINING INTENSITY Progesterone Receptor: 0%, NEGATIVE Proliferation Marker Ki67: 30%  Results: HER2 - NEGATIVE RATIO OF HER2/CEP17 SIGNALS 1.08 AVERAGE HER2 COPY NUMBER PER CELL 1.30  Diagnosis 07/05/2015 1. Breast, simple mastectomy, Left INVASIVE DUCTAL CARCINOMA WITH EXTRACELLULAR MUCIN, GRADE 2, SPINNING 2.0 CM THE CARCINOMA IS BROADLY PRESENTED LESS THAN 1 MM FROM THE DEEP MARGIN LOBULAR NEOPLASIA (LOBUAR CARCINOMA IN  SITU) FIBROCYSTIC CHANGES WITH ADENOSIS AND CALCIFICATIONS 2. Lymph node, sentinel, biopsy, Left axillary ONE BENIGN LYMPH NODE (0/1) 3. Breast, excision, Left additional anterior margin over tumor BENIGN BREAST TISSUE, NEGATIVE FOR CARCINOMA 4. Lymph node, sentinel, biopsy, Left axillary ONE BENIGN LYMPH NODE (0/1) 5. Lymph  node, sentinel, biopsy, Left axillary ONE BENIGN LYMPH NODE (0/1)  Microscopic Comment 1. BREAST, INVASIVE TUMOR, WITH LYMPH NODES PRESENT Specimen, including laterality and lymph node sampling (sentinel, non-sentinel): Left breast, sentinel lymph nodes Procedure: Simple mastectomy Histologic type: Ductal carcinoma Grade: 2 Tubule formation: 3 Nuclear pleomorphism: 1 Mitotic:2 Tumor size (gross measurement or glass slide measurement): 2.0 cm Margins: Invasive, distance to closest margin: Less than 1 mm from the deep margin In-situ, distance to closest margin: 0.4cm from the anteriro margin If margin positive, focally or broadly: Broadly Lymphovascular invasion: Negative Ductal carcinoma in situ: Present Grade: 2 Extensive intraductal component: moderate Lobular neoplasia: Present Tumor focality: Focal Treatment effect: Negative If present, treatment effect in breast tissue, lymph nodes or both: NA Extent of tumor: Skin: Negative Nipple: Negative Skeletal muscle: Negative Lymph nodes: Examined: 3 Sentinel 0 Non-sentinel 0 Total Lymph nodes with metastasis: 0 Isolated tumor cells (< 0.2 mm): NA Micrometastasis: (> 0.2 mm and < 2.0 mm): NA Macrometastasis: (> 2.0 mm): NA Extracapsular extension: NA Breast prognostic profile: [id number] Estrogen receptor: 100% Progesterone receptor: 0% Her 2 neu: Negative Ki-67: 30% Non-neoplastic breast: Fibrocystic changes with adenosis and calcifications TNM: pT2, pN0 Comments: PR and Her has been reorded.  1. PROGNOSTIC INDICATORS Results: IMMUNOHISTOCHEMICAL AND MORPHOMETRIC ANALYSIS PERFORMED  MANUALLY Progesterone Receptor: 0%, NEGATIVE  Results: HER2 - NEGATIVE RATIO OF HER2/CEP17 SIGNALS 1.21 AVERAGE HER2 COPY NUMBER PER CELL 1.75  ONCOTYPE DX RS 28 (intermediate risk), which predicts 10 year risk of distant recurrence 18% with tamoxifen alone  RADIOGRAPHIC STUDIES: I have personally reviewed the radiological images as listed and agreed with the findings in the report.  Bone density scan 09/05/2015 Osteopenia, T score of -2.2 at left femoral neck 10 year probability of fracture: Major osteoporotic fracture 12.3%, hip fracture 2.3% No significant change from 05/2013 (T score -2.1)  Diagnostic mammogram and ultrasound of the left breast and axilla 06/19/2015 Impression: Suspicious of malignancy The 1.3 cm oval solid mass with an indistinct margin in the upper inner quadrant posterior depth of the left breast is suspicious of malignancy. An ultrasound-guided biopsy is recommended.  ASSESSMENT:  66 y.o.  female   1.  Breast cancer of upper inner quadrant of left breast,  Invasive ductal carcinoma, pT2N0M0, stage IIA, G2, ER+/PR-/HER2-, ONCOTYPE RS 28  --I previously discussed her surgical path result in details -the Oncotype Dx result was reviewed with her in details. She has intermedia risk based on the recurrence score, which predicts 10 year distant recurrence after 5 years of tamoxifen 18%. The benefit of chemotherapy in the intermedia risk group is small and controversial. The predicted benefit from chemotherapy to reduce her risk of distant recurrence in her case is probably around 4% in 10-years. Given her  other medical comorbidities, and her reluctance to take chemotherapy, we decided not to pursue adjuvant chemotherapy, after a long discussion with patient and her husband.  -Giving the strong ER and PR expression in her tumor and her postmenopausal status, I recommend adjuvant endocrine therapy with  aromatase inhibitor for 5-10 years. -She tried exemestane first, but  developed skin rash after months. Skin rash has resolved after stopped exemestane. -She has been tolerating anastrozole well, we'll continue, plan for 5-10 years.  -We'll continue breast cancer surveillance, she had bilateral mastectomy, no need mammogram. I'll continue following her with lab and exam.  -Lab results reviewed with her, mild and stable anemia and CKD  -She is clinically doing well, exam was unremarkable today, no concerns of recurrence. -  She is scheduled to see Dr. Barry Dienes in 3-4 months, I will see her in in 8 months    2. extensive right breast DCIS, >10cm, high grade ER/PR negative - this is cured by mastectomy.   3 Bone health Her last DEXA scan in 08/2015 showed osteopenia, stable comparing to 2015 -she previously received biphosphonate, per pt  -I previously encouraged her to continue calcium and vitamin D replacement -We discussed that aromatase inhibitor may weak her bone -If she has significant decrease in her bone density, I would consider switching anastrozole to tamoxifen -She does take calcium and I encouraged her to take vitamins like centrum  silver.    4. Arthritis, chronic back pain, hypertension, CKD stage III  -She will continue follow-up with her primary care physician  -Her renal function is stable overall  5. Anemia -Possibly anemia of chronic disease secondary to CKD -Her iron study, folic acid, and V74 levels were normal in September 2017.  6. Pain in left rib cage -she has had this pain for a month but improving -She is taking Tylenol and occasional oxycodone  She has a pain stimulate in her left back for her chronic back pain, not sure if it's related  -Will monitor    Plan -Continue anastrozole 1 mg once daily -I'll see her back in 8 months with lab, she will see Dr. Barry Dienes in 3-4 months    All questions were answered. The patient knows to call the clinic with any problems, questions or concerns.  I spent 20 minutes counseling the  patient face to face. The total time spent in the appointment was 25 minutes and more than 50% was on counseling.  This document serves as a record of services personally performed by Truitt Merle, MD. It was created on her behalf by Joslyn Devon, a trained medical scribe. The creation of this record is based on the scribe's personal observations and the provider's statements to them. This document has been checked and approved by the attending provider.      Truitt Merle, MD 09/01/2016

## 2016-09-01 ENCOUNTER — Encounter: Payer: Self-pay | Admitting: Hematology

## 2016-09-01 ENCOUNTER — Other Ambulatory Visit (HOSPITAL_BASED_OUTPATIENT_CLINIC_OR_DEPARTMENT_OTHER): Payer: PPO

## 2016-09-01 ENCOUNTER — Ambulatory Visit (HOSPITAL_BASED_OUTPATIENT_CLINIC_OR_DEPARTMENT_OTHER): Payer: PPO | Admitting: Hematology

## 2016-09-01 ENCOUNTER — Telehealth: Payer: Self-pay | Admitting: Hematology

## 2016-09-01 VITALS — BP 120/50 | HR 76 | Temp 98.0°F | Resp 18 | Ht 63.0 in | Wt 140.8 lb

## 2016-09-01 DIAGNOSIS — M549 Dorsalgia, unspecified: Secondary | ICD-10-CM | POA: Diagnosis not present

## 2016-09-01 DIAGNOSIS — D649 Anemia, unspecified: Secondary | ICD-10-CM

## 2016-09-01 DIAGNOSIS — M1990 Unspecified osteoarthritis, unspecified site: Secondary | ICD-10-CM

## 2016-09-01 DIAGNOSIS — C50212 Malignant neoplasm of upper-inner quadrant of left female breast: Secondary | ICD-10-CM

## 2016-09-01 DIAGNOSIS — R0781 Pleurodynia: Secondary | ICD-10-CM

## 2016-09-01 DIAGNOSIS — C50411 Malignant neoplasm of upper-outer quadrant of right female breast: Secondary | ICD-10-CM

## 2016-09-01 DIAGNOSIS — N183 Chronic kidney disease, stage 3 (moderate): Secondary | ICD-10-CM

## 2016-09-01 DIAGNOSIS — Z170 Estrogen receptor positive status [ER+]: Secondary | ICD-10-CM | POA: Diagnosis not present

## 2016-09-01 DIAGNOSIS — Z79811 Long term (current) use of aromatase inhibitors: Secondary | ICD-10-CM | POA: Diagnosis not present

## 2016-09-01 DIAGNOSIS — Z853 Personal history of malignant neoplasm of breast: Secondary | ICD-10-CM | POA: Diagnosis not present

## 2016-09-01 DIAGNOSIS — M8580 Other specified disorders of bone density and structure, unspecified site: Secondary | ICD-10-CM | POA: Diagnosis not present

## 2016-09-01 LAB — COMPREHENSIVE METABOLIC PANEL
ALT: 23 U/L (ref 0–55)
AST: 29 U/L (ref 5–34)
Albumin: 3.8 g/dL (ref 3.5–5.0)
Alkaline Phosphatase: 63 U/L (ref 40–150)
Anion Gap: 11 mEq/L (ref 3–11)
BUN: 16.8 mg/dL (ref 7.0–26.0)
CO2: 28 mEq/L (ref 22–29)
Calcium: 9.9 mg/dL (ref 8.4–10.4)
Chloride: 108 mEq/L (ref 98–109)
Creatinine: 1.3 mg/dL — ABNORMAL HIGH (ref 0.6–1.1)
EGFR: 42 mL/min/{1.73_m2} — ABNORMAL LOW (ref >90)
Glucose: 96 mg/dl (ref 70–140)
Potassium: 4.2 mEq/L (ref 3.5–5.1)
Sodium: 146 mEq/L — ABNORMAL HIGH (ref 136–145)
Total Bilirubin: <0.22 mg/dL (ref 0.20–1.20)
Total Protein: 6.7 g/dL (ref 6.4–8.3)

## 2016-09-01 LAB — CBC WITH DIFFERENTIAL/PLATELET
BASO%: 0.9 % (ref 0.0–2.0)
Basophils Absolute: 0.1 10*3/uL (ref 0.0–0.1)
EOS%: 5.7 % (ref 0.0–7.0)
Eosinophils Absolute: 0.4 10*3/uL (ref 0.0–0.5)
HCT: 31.6 % — ABNORMAL LOW (ref 34.8–46.6)
HGB: 10.4 g/dL — ABNORMAL LOW (ref 11.6–15.9)
LYMPH%: 38.2 % (ref 14.0–49.7)
MCH: 29.9 pg (ref 25.1–34.0)
MCHC: 32.9 g/dL (ref 31.5–36.0)
MCV: 90.6 fL (ref 79.5–101.0)
MONO#: 0.7 10*3/uL (ref 0.1–0.9)
MONO%: 10.2 % (ref 0.0–14.0)
NEUT#: 3.1 10*3/uL (ref 1.5–6.5)
NEUT%: 45 % (ref 38.4–76.8)
Platelets: 240 10*3/uL (ref 145–400)
RBC: 3.48 10*6/uL — ABNORMAL LOW (ref 3.70–5.45)
RDW: 13.6 % (ref 11.2–14.5)
WBC: 7 10*3/uL (ref 3.9–10.3)
lymph#: 2.7 10*3/uL (ref 0.9–3.3)

## 2016-09-01 NOTE — Telephone Encounter (Signed)
Appointments scheduled per 09/01/16 los. Patient was given a copy of the AVS report and appointment schedule, per 09/01/16 los.

## 2016-09-03 IMAGING — NM NM MYOCAR MULTI W/ SPECT
3 series · 18 of 18 positions shown · non-contrast
Comparison: none

[Series 1: rest · 6.51mm/px · 6 of 64 frames shown]
[frame 6/64]
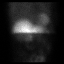
[frame 16/64]
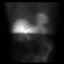
[frame 27/64]
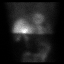
[frame 38/64]
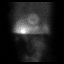
[frame 48/64]
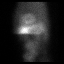
[frame 59/64]
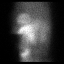

[Series 2: stress · 6.51mm/px · 6 of 512 frames shown (1 of 2)]
[frame 43/512]
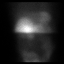
[frame 128/512]
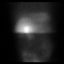
[frame 214/512]
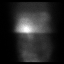
[frame 299/512]
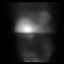
[frame 384/512]
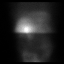
[frame 470/512]
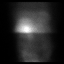

[Series 2: stress · 6.51mm/px · 6 of 64 frames shown (2 of 2)]
[frame 6/64]
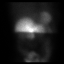
[frame 16/64]
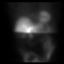
[frame 27/64]
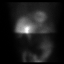
[frame 38/64]
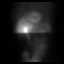
[frame 48/64]
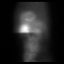
[frame 59/64]
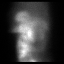

[18 of 18 positions shown; findings below may reference images not displayed]

Canned report from images found in remote index.

Refer to host system for actual result text.

## 2016-09-15 DIAGNOSIS — C50911 Malignant neoplasm of unspecified site of right female breast: Secondary | ICD-10-CM | POA: Diagnosis not present

## 2016-09-15 DIAGNOSIS — C50212 Malignant neoplasm of upper-inner quadrant of left female breast: Secondary | ICD-10-CM | POA: Diagnosis not present

## 2016-09-17 DIAGNOSIS — D126 Benign neoplasm of colon, unspecified: Secondary | ICD-10-CM | POA: Diagnosis not present

## 2016-09-17 DIAGNOSIS — B001 Herpesviral vesicular dermatitis: Secondary | ICD-10-CM | POA: Diagnosis not present

## 2016-09-17 DIAGNOSIS — J029 Acute pharyngitis, unspecified: Secondary | ICD-10-CM | POA: Diagnosis not present

## 2016-09-17 DIAGNOSIS — K219 Gastro-esophageal reflux disease without esophagitis: Secondary | ICD-10-CM | POA: Diagnosis not present

## 2016-10-27 DIAGNOSIS — R634 Abnormal weight loss: Secondary | ICD-10-CM | POA: Diagnosis not present

## 2016-10-27 DIAGNOSIS — N183 Chronic kidney disease, stage 3 (moderate): Secondary | ICD-10-CM | POA: Diagnosis not present

## 2016-10-27 DIAGNOSIS — G893 Neoplasm related pain (acute) (chronic): Secondary | ICD-10-CM | POA: Diagnosis not present

## 2016-10-27 DIAGNOSIS — J301 Allergic rhinitis due to pollen: Secondary | ICD-10-CM | POA: Diagnosis not present

## 2016-10-27 DIAGNOSIS — M545 Low back pain: Secondary | ICD-10-CM | POA: Diagnosis not present

## 2016-10-27 DIAGNOSIS — M961 Postlaminectomy syndrome, not elsewhere classified: Secondary | ICD-10-CM | POA: Diagnosis not present

## 2016-10-27 DIAGNOSIS — M797 Fibromyalgia: Secondary | ICD-10-CM | POA: Diagnosis not present

## 2016-10-27 DIAGNOSIS — I129 Hypertensive chronic kidney disease with stage 1 through stage 4 chronic kidney disease, or unspecified chronic kidney disease: Secondary | ICD-10-CM | POA: Diagnosis not present

## 2016-10-27 DIAGNOSIS — G9050 Complex regional pain syndrome I, unspecified: Secondary | ICD-10-CM | POA: Diagnosis not present

## 2016-10-27 DIAGNOSIS — Z9689 Presence of other specified functional implants: Secondary | ICD-10-CM | POA: Diagnosis not present

## 2016-10-27 DIAGNOSIS — B001 Herpesviral vesicular dermatitis: Secondary | ICD-10-CM | POA: Diagnosis not present

## 2016-10-27 DIAGNOSIS — Z0000 Encounter for general adult medical examination without abnormal findings: Secondary | ICD-10-CM | POA: Diagnosis not present

## 2016-10-27 DIAGNOSIS — F322 Major depressive disorder, single episode, severe without psychotic features: Secondary | ICD-10-CM | POA: Diagnosis not present

## 2016-10-27 DIAGNOSIS — C50919 Malignant neoplasm of unspecified site of unspecified female breast: Secondary | ICD-10-CM | POA: Diagnosis not present

## 2016-10-27 DIAGNOSIS — I341 Nonrheumatic mitral (valve) prolapse: Secondary | ICD-10-CM | POA: Diagnosis not present

## 2016-10-27 DIAGNOSIS — E782 Mixed hyperlipidemia: Secondary | ICD-10-CM | POA: Diagnosis not present

## 2016-10-27 DIAGNOSIS — Z1159 Encounter for screening for other viral diseases: Secondary | ICD-10-CM | POA: Diagnosis not present

## 2016-10-28 ENCOUNTER — Other Ambulatory Visit: Payer: Self-pay

## 2016-10-28 NOTE — Patient Outreach (Signed)
    Unsuccessful attempt made to contact patient via telephone for HTA Screening.  Plan: Send printed HTN literature describing programs offered by [facility] and contact information for use by patient if needed.

## 2016-12-09 DIAGNOSIS — D649 Anemia, unspecified: Secondary | ICD-10-CM | POA: Diagnosis not present

## 2016-12-09 DIAGNOSIS — K589 Irritable bowel syndrome without diarrhea: Secondary | ICD-10-CM | POA: Diagnosis not present

## 2016-12-09 DIAGNOSIS — C50919 Malignant neoplasm of unspecified site of unspecified female breast: Secondary | ICD-10-CM | POA: Diagnosis not present

## 2016-12-09 DIAGNOSIS — R112 Nausea with vomiting, unspecified: Secondary | ICD-10-CM | POA: Diagnosis not present

## 2017-01-14 DIAGNOSIS — G8929 Other chronic pain: Secondary | ICD-10-CM | POA: Diagnosis not present

## 2017-01-14 DIAGNOSIS — Z79899 Other long term (current) drug therapy: Secondary | ICD-10-CM | POA: Diagnosis not present

## 2017-01-14 DIAGNOSIS — G894 Chronic pain syndrome: Secondary | ICD-10-CM | POA: Diagnosis not present

## 2017-01-14 DIAGNOSIS — Z5181 Encounter for therapeutic drug level monitoring: Secondary | ICD-10-CM | POA: Diagnosis not present

## 2017-01-14 DIAGNOSIS — M545 Low back pain: Secondary | ICD-10-CM | POA: Diagnosis not present

## 2017-02-04 DIAGNOSIS — Z86010 Personal history of colonic polyps: Secondary | ICD-10-CM | POA: Diagnosis not present

## 2017-02-17 DIAGNOSIS — C50212 Malignant neoplasm of upper-inner quadrant of left female breast: Secondary | ICD-10-CM | POA: Diagnosis not present

## 2017-02-17 DIAGNOSIS — C50911 Malignant neoplasm of unspecified site of right female breast: Secondary | ICD-10-CM | POA: Diagnosis not present

## 2017-04-03 DIAGNOSIS — D126 Benign neoplasm of colon, unspecified: Secondary | ICD-10-CM | POA: Diagnosis not present

## 2017-04-03 DIAGNOSIS — K640 First degree hemorrhoids: Secondary | ICD-10-CM | POA: Diagnosis not present

## 2017-04-03 DIAGNOSIS — Z86010 Personal history of colonic polyps: Secondary | ICD-10-CM | POA: Diagnosis not present

## 2017-04-17 ENCOUNTER — Telehealth: Payer: Self-pay | Admitting: Hematology

## 2017-04-17 NOTE — Telephone Encounter (Signed)
lvm advising appt chgd from 2/4 to 2/20 @ 11am.

## 2017-04-22 DIAGNOSIS — G894 Chronic pain syndrome: Secondary | ICD-10-CM | POA: Diagnosis not present

## 2017-04-22 DIAGNOSIS — M961 Postlaminectomy syndrome, not elsewhere classified: Secondary | ICD-10-CM | POA: Diagnosis not present

## 2017-04-22 DIAGNOSIS — G479 Sleep disorder, unspecified: Secondary | ICD-10-CM | POA: Diagnosis not present

## 2017-04-22 DIAGNOSIS — M545 Low back pain: Secondary | ICD-10-CM | POA: Diagnosis not present

## 2017-04-29 DIAGNOSIS — M797 Fibromyalgia: Secondary | ICD-10-CM | POA: Diagnosis not present

## 2017-04-29 DIAGNOSIS — I129 Hypertensive chronic kidney disease with stage 1 through stage 4 chronic kidney disease, or unspecified chronic kidney disease: Secondary | ICD-10-CM | POA: Diagnosis not present

## 2017-04-29 DIAGNOSIS — F322 Major depressive disorder, single episode, severe without psychotic features: Secondary | ICD-10-CM | POA: Diagnosis not present

## 2017-04-29 DIAGNOSIS — G9050 Complex regional pain syndrome I, unspecified: Secondary | ICD-10-CM | POA: Diagnosis not present

## 2017-04-29 DIAGNOSIS — N183 Chronic kidney disease, stage 3 (moderate): Secondary | ICD-10-CM | POA: Diagnosis not present

## 2017-04-29 DIAGNOSIS — C50919 Malignant neoplasm of unspecified site of unspecified female breast: Secondary | ICD-10-CM | POA: Diagnosis not present

## 2017-04-29 DIAGNOSIS — E782 Mixed hyperlipidemia: Secondary | ICD-10-CM | POA: Diagnosis not present

## 2017-05-04 ENCOUNTER — Ambulatory Visit: Payer: PPO | Admitting: Hematology

## 2017-05-04 ENCOUNTER — Other Ambulatory Visit: Payer: PPO

## 2017-05-12 DIAGNOSIS — N183 Chronic kidney disease, stage 3 (moderate): Secondary | ICD-10-CM | POA: Diagnosis not present

## 2017-05-12 DIAGNOSIS — N179 Acute kidney failure, unspecified: Secondary | ICD-10-CM | POA: Diagnosis not present

## 2017-05-20 ENCOUNTER — Other Ambulatory Visit: Payer: PPO

## 2017-05-20 ENCOUNTER — Ambulatory Visit: Payer: PPO | Admitting: Hematology

## 2017-05-27 DIAGNOSIS — F3181 Bipolar II disorder: Secondary | ICD-10-CM | POA: Diagnosis not present

## 2017-06-14 IMAGING — CR DG CHEST 2V
2 series · 2 of 2 positions shown · non-contrast
Comparison: 05/30/2008

CLINICAL DATA: Worsening cough over the past week.

EXAM:
CHEST  2 VIEW

[chest pa]
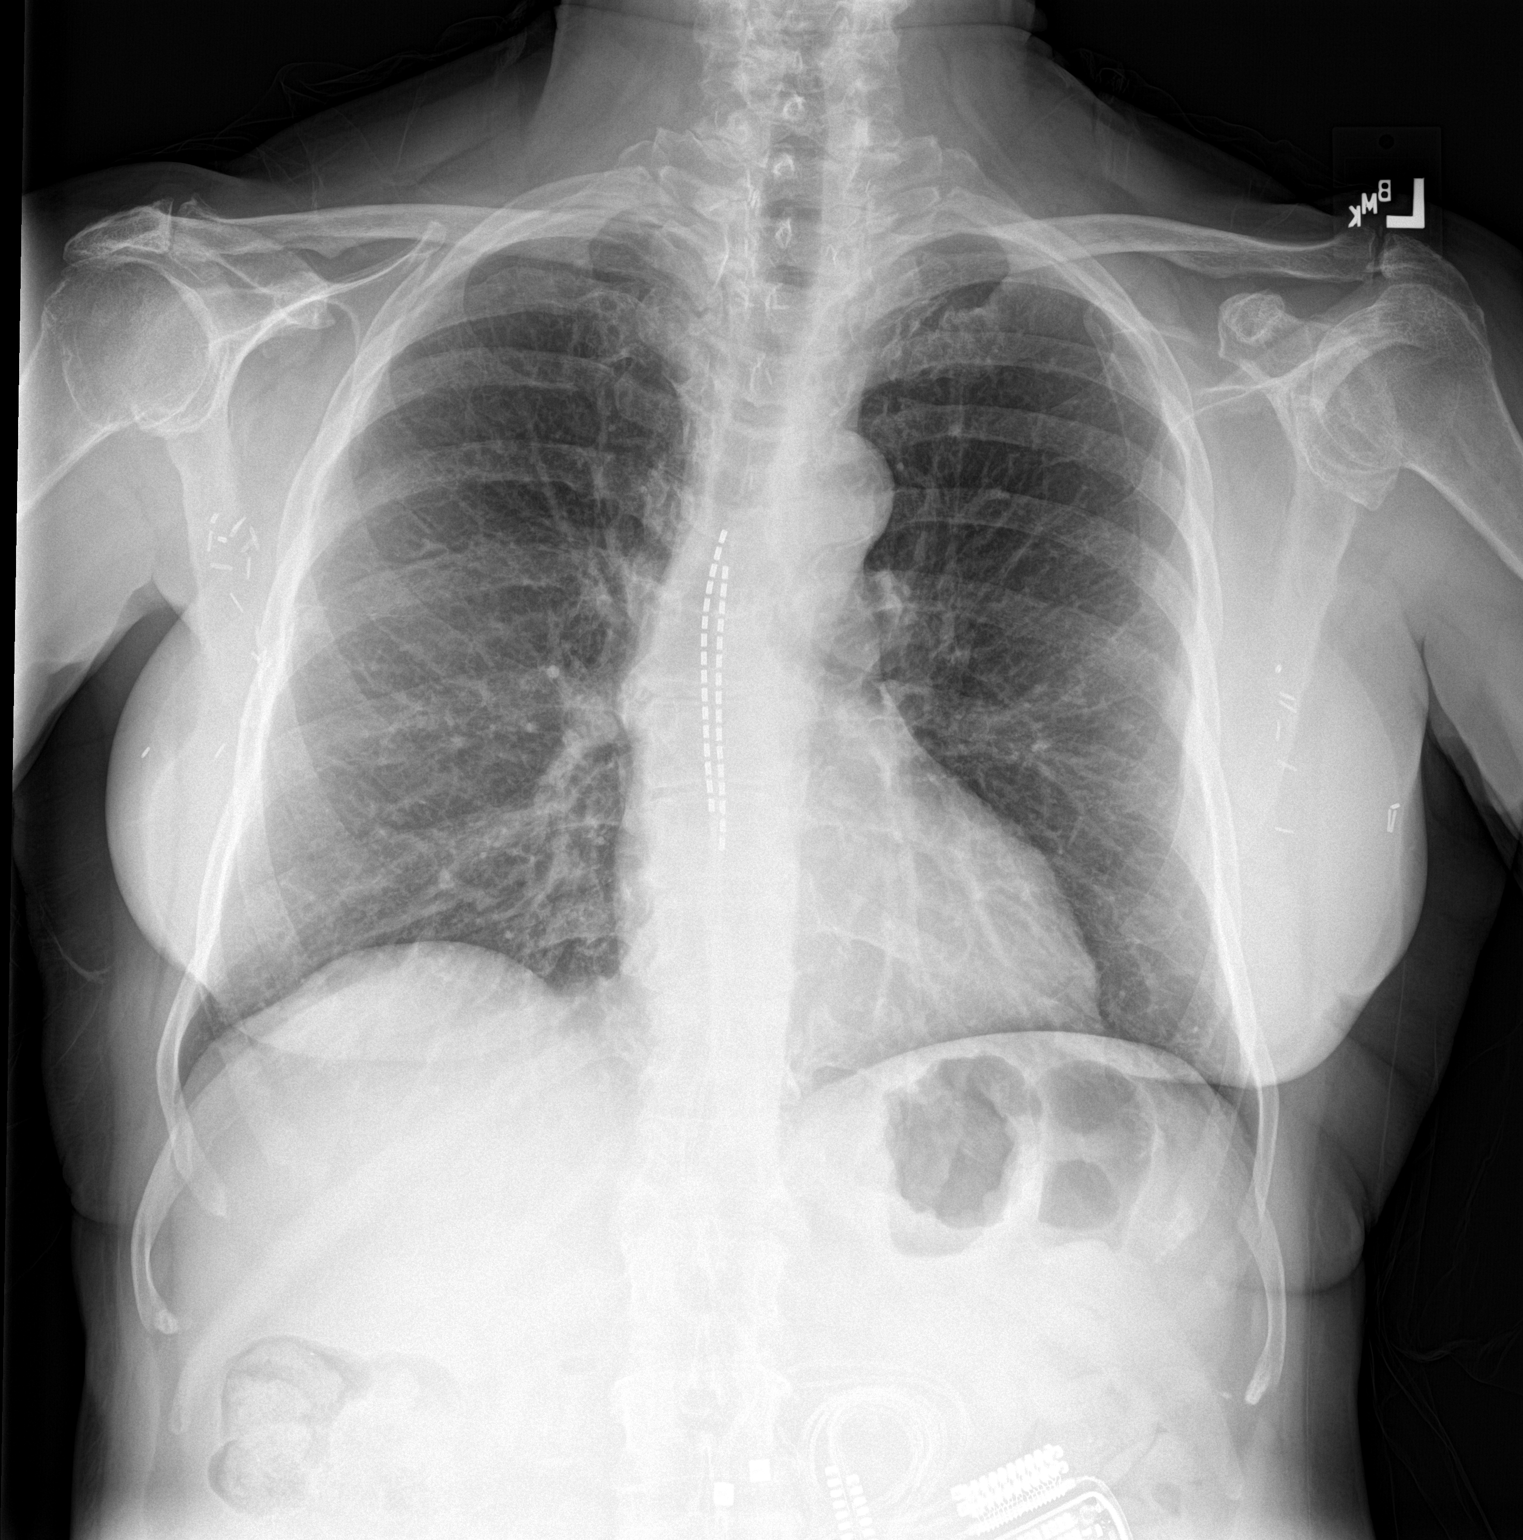

[chest lat]
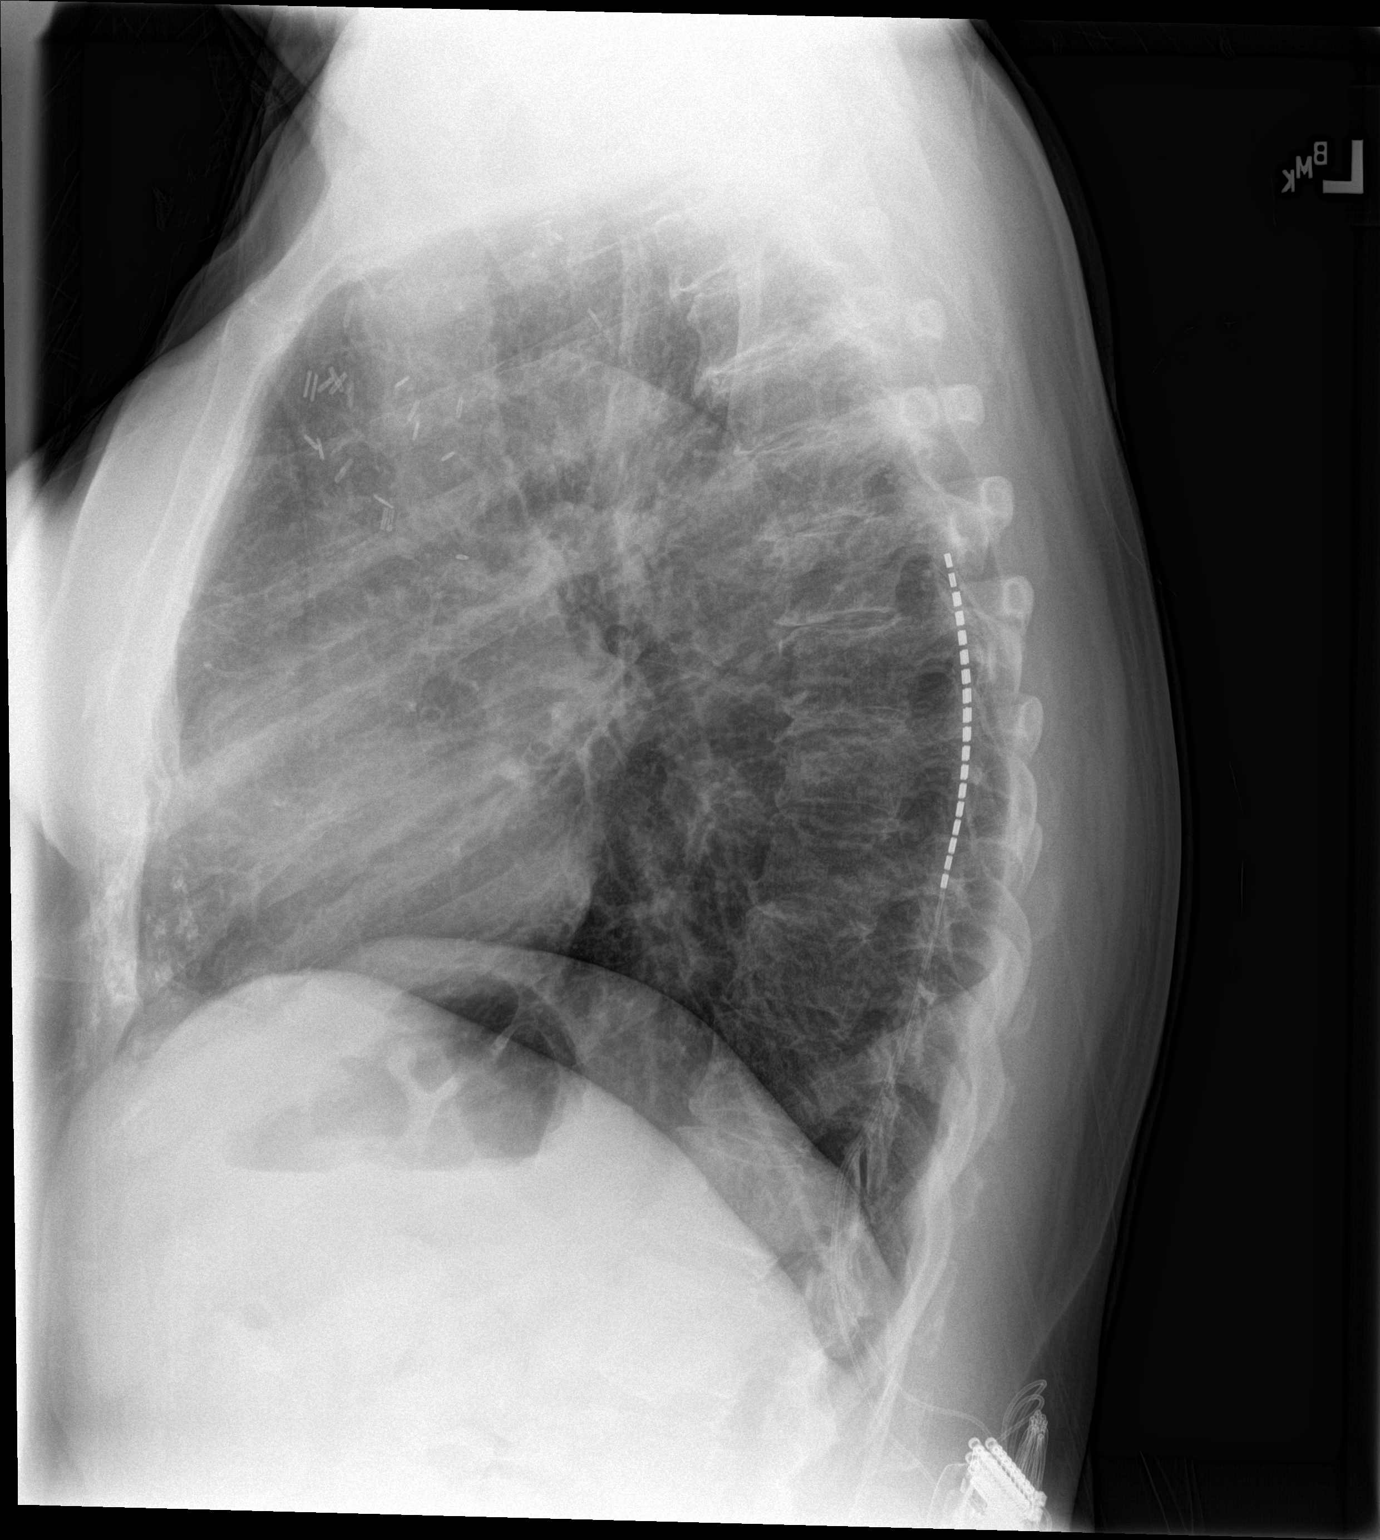

[2 of 2 positions shown; findings below may reference images not displayed]

FINDINGS: The heart size and mediastinal contours are within normal limits.
Both lungs are clear. The visualized skeletal structures are
unremarkable.

Implanted stimulating device in the thoracic spinal canal. Bilateral
axillary surgical clips.
IMPRESSION: No active cardiopulmonary disease.

## 2017-06-27 NOTE — Progress Notes (Signed)
Dictation #1 [id number]  [place] Follow up Note   Patient Care Team: Mayra Neer, MD as PCP - General (Family Medicine) Holley Bouche, NP as Nurse Practitioner (Nurse Practitioner) [name], MD as Consulting Physician (General Surgery) Truitt Merle, MD as Consulting Physician (Hematology) Sylvan Cheese, NP as Nurse Practitioner (Hematology and Oncology) Crissie Reese, MD as Consulting Physician (Plastic Surgery)  CHIEF COMPLAINTS:  Follow up bilateral breast cancer   Oncology History   Breast cancer of upper-outer quadrant of RIGHT female breast [facility])   Staging form: Breast, AJCC 7th Edition Clinical stage from 09/06/2014: Stage 0 (Tis (DCIS), N0, M0)  Pathologic stage from 01/25/2015: Stage 0 (Tis (DCIS), N0, cM0)  ------  Breast cancer of upper-inner quadrant of LEFT female breast ([place])   Staging form: Breast, AJCC 7th Edition Clinical stage from 06/20/2015: Stage IA (T1c, N0, M0)      Pathologic stage from 07/05/2015: Stage IIA (T2, N0, cM0)      Breast cancer of upper-outer quadrant of right female breast ([place])   08/23/2014 Mammogram    Mammogram and ultrasound showed a 3cm cystic lesion at 8:30 of right breast, and a 7 mm and a 5 mm increased density lesion at 10:00 of the right breast.      08/29/2014 Initial Biopsy    Right breast core needle bx x 2: high-grade DCIS, with focus of mildly suspicious for early stromal invasion. ER- (0%), PR- (0%).       08/29/2014 Clinical Stage    Stage 0: Tis N0      09/13/2014 Surgery    Right lumpectomy/SLNB ([place]): High grade DCIS with necrosis and calcfications, positive margins. 5 LN removed and negative for malignancy (0/5). Grade 3.      09/13/2014 Pathologic Stage    Stage 0: Tis N0      09/29/2014 Surgery    Re-excision for positive margins; multiple breast margins re-excised, high-grade DCIS, measuring 1.0 cm, 3.0 cm, 2.0 cm, some margins are still positive or very  close.       01/25/2015 Definitive Surgery    Right nipple-sparing mastectomy Barry Dienes): DCIS with calcifications, high grade, spans 9 cm, LCIS, fibrocystic changes with adenosis and calcifications, negative margins. 1 LN removed and negative. Imeediate reconstruction      03/23/2015 Survivorship    Survivorship care plan completed and mailed to patient in lieu of in person visit at request of patient.       Breast cancer of upper-inner quadrant of left female breast ([place])   06/28/2013 Imaging    DEXA scan: Osteopenia [name])      06/19/2015 Mammogram    Diagnostic mammogram and ultrasound showed a 1.3 cm in the upper inner quadrant of left breast, and a 0.9 mm simple cyst. Axilla was negative.      06/20/2015 Receptors her2    ER 100% positive, PR negative, Ki-67 30%, HER-2 negative      06/20/2015 Initial Biopsy    The left breast upper inner quadrant mass biopsy showed invasive ductal carcinoma, grade 2      06/20/2015 Initial Diagnosis    Breast cancer of upper-inner quadrant of left female breast ([place])      07/05/2015 Surgery    Left breast nipple-sparing mastectomy and sentinel lymph node biopsy with immediate reconstruction Barry Dienes); reconstruction with Dr. Harlow Mares at [facility]      07/05/2015 Pathology Results    Left mastectomy: Invasive ductal carcinoma with extracellular mucin, grade 2, spanning 2.0 cm,  margins were negative, lobular carcinoma in situ, 3 sentinel lymph nodes negative. PR repeated and remains negative. HER2 remains neg (ratio 1.21)      07/05/2015 Pathologic Stage    pT2, pN0: Stage IIA       07/05/2015 Oncotype testing    RS 28 (intermediate risk), which predicts 10 year risk of distant recurrence 18% with tamoxifen alone      07/30/2015 - 08/30/2015 Anti-estrogen oral therapy    Adjuvant exemestane 25 mg once daily, stopped after one month due to skin rash.       09/01/2015 -  Anti-estrogen oral therapy    Switched to anastrozole.  Planned duration  of treatment : 5-10 years [facility])       HISTORY OF PRESENTING ILLNESS (09/06/2014):  Connie Bennett 67 y.o. female is here because of recent diagnosis of right breast DCIS  On her recent follow-up with her primary care physician, a lump in her right breat was found on exam, and she was sent for mammogram. Her diagnostic mammogram and ultrasound on 08/19/2014 showed a 3 cm cystic lesion, an additional 7 mm and a 5 mm increased density lesion at 10:00 of her right breast. She has right breast cysts before which was biopsied before. She underwent core needle biopsy on 08/29/2014, which showed high-grade DCIS. The aspiration of the cystic lesion showed no malignant cells on cytology.  She has chronic diffuse arthritis pain, she had a back surgery twice and has a stimulator for pain control. She is able to take care of herself, and does some light housework. She walks around with a cane. She denies any recent change of her energy level, appetite or weight.  In terms of breast cancer risk profile:  She menarched at early age of 64 and went to menopause at age 56 She had 1 pregnancy, her first child was born at age 67  She did not breast-fed her child.  She received birth control pills for approximately 15 years.  She was never exposed to fertility medications, but use hormone replacement therapy for 3-5 years  She has  family history of Breast/GYN/GI cancer  CURRENT THERAPY: Anastrozole 1 mg daily, started on 09/28/2015  INTERIM HISTORY: Connie Bennett returns for follow-up of her breast cancer. She presents to the clinic today with her husband. She reports she is doing well overall. She endorses a good appetite. She states she is trying to be as active as her body will let her. She is compliant with Anastrozole with no complaints. She is is tolerating it well. She saw Dr. Barry Dienes 3-4 months ago for a normal check up.   On review of systems, pt denies chest discomfort, headache or any other  complaints at this time. Pertinent positives are listed and detailed within the above HPI.   MEDICAL HISTORY:  Past Medical History:  Diagnosis Date  . Anxiety    takes Xanax daily as needed  . Arthritis   . Bilateral lower extremity edema    takes Furosemide daily as needed  . Breast cancer [facility]) oncologist-  dr Truitt Merle--  right upper-outer quadrant    dx May 2016--- Stage 0  (Tis,N0,M0)  DCIS  Right breast---  09-13-2014  s/p  radioactive seed/ partial mastectomy / sln bx  . Chronic fatigue syndrome   . Chronic low back pain   . Complication of anesthesia    spinal cord stimulator- to be off for surgery  . Depression    takes Effexor daily  .  Family history of adverse reaction to anesthesia    mom hard to wake up  . Fibromyalgia   . Headache(784.0)    couple of times a week  . Heart murmur   . History of bronchitis > 35yr ago  . History of colon polyps    benign  . History of drug overdose    oxycontin  and oxycodone  Sept 2015--  now has narcan injection prescription  . History of DVT of lower extremity    2008--  BILATERAL  . History of hiatal hernia   . HTN (hypertension)    takes Amlodipine and Ramipril daily  . Hyperlipidemia    takes Zetia and Crestor  daily  . Joint pain   . Mitral valve prolapse    MILD /   PER PT ASYMPTOMATIC  . Muscle spasm    takes Zanaflex daily as needed  . Osteoporosis   . Peripheral neuropathy    takes Neurontin daily  . Peripheral vascular disease (HCC)    hx dvt's  . RSD (reflex sympathetic dystrophy)    left wrist and forearm from a fx  . RSD (reflex sympathetic dystrophy)   . [place]   . Vertigo    takes Meclizine daily     SURGICAL HISTORY: Past Surgical History:  Procedure Laterality Date  . BACK SURGERY    . BREAST RECONSTRUCTION WITH PLACEMENT OF TISSUE EXPANDER AND FLEX HD (ACELLULAR HYDRATED DERMIS) Right 01/25/2015   Procedure: RIGHT BREAST RECONSTRUCTION WITH PLACEMENT OF IMPLANT AND ACELLULAR  DERMAL MATRIX ;  Surgeon: DCrissie Reese MD;  [place]  Service: Plastics;  Laterality: Right;  . BREAST RECONSTRUCTION WITH PLACEMENT OF TISSUE EXPANDER AND FLEX HD (ACELLULAR HYDRATED DERMIS) Left 07/05/2015   Procedure: LEFT BREAST RECONSTRUCTION WITH PLACEMENT OF TISSUE EXPANDER AND  ACELLULAR DERMAL MATRIX ;  Surgeon: DCrissie Reese MD;  [place]  Service: Plastics;  Laterality: Left;  . COLONOSCOPY    . ESOPHAGOGASTRODUODENOSCOPY    . eye lid surgery Bilateral   . FOOT SURGERY Bilateral 1996   MORTON'S NEUROMA  . INNER EAR SURGERY Right   . LUMBAR DISC SURGERY    . MASTECTOMY Right 01/25/2015   nipple sparing   . NASAL SINUS SURGERY    . NIPPLE SPARING MASTECTOMY/SENTINAL LYMPH NODE BIOPSY/RECONSTRUCTION/PLACEMENT OF TISSUE EXPANDER Left 07/05/2015   Procedure: LEFT NIPPLE SPARING MASTECTOMY WITH SENTINAL LYMPH NODE BIOPSY ;  Surgeon: FStark Klein MD;  [place]  Service: General;  Laterality: Left;  . ORIF LEFT WRIST FX'S/  LEFT CARPAL TUNNEL RELEASE  1999  . POSTERIOR LUMBAR FUSION  01-30-2009   L4--5 Laminectmy w/ decompression/  bilateral L4--5 microdiskectomy and fusion  . RADIOACTIVE SEED GUIDED PARTIAL MASTECTOMY WITH AXILLARY SENTINEL LYMPH NODE BIOPSY Right 09/13/2014   Procedure: RADIOACTIVE SEED GUIDED PARTIAL MASTECTOMY WITH AXILLARY SENTINEL LYMPH NODE BIOPSY;  Surgeon: FStark Klein MD;  [place]  Service: General;  Laterality: Right;  . RE-EXCISION OF BREAST LUMPECTOMY Right 09/29/2014   Procedure: RE-EXCISION OF BREAST LUMPECTOMY;  Surgeon: FStark Klein MD;  [place]  Service: General;  Laterality: Right;  . RIGHT CARPAL TUNNEL RELEASE/  TRIGGER RELEASE RIGHT MIDDLE FINGER  06-18-2006  . RIGHT INFERIOR PARATHYROIDECTOMY  04-07-2006  . SIMPLE MASTECTOMY WITH AXILLARY SENTINEL NODE BIOPSY Right 01/25/2015   Procedure: RIGHT NIPPLE SPARING MASTECTOMY;  Surgeon: FStark Klein MD;  [place]   Service: General;  Laterality: Right;  . SPINAL CORD STIMULATOR  IMPLANT  2013  . TONSILLECTOMY  as child    SOCIAL HISTORY: Social History   Socioeconomic History  . Marital status: Married    Spouse name: Not on file  . Number of children: 1  . Years of education: Not on file  . Highest education level: Not on file  Occupational History  . Occupation: unemployed    Employer: DISABLED    Comment: Probation officer  . Occupation: disabled  Social Needs  . Financial resource strain: Not on file  . Food insecurity:    Worry: Not on file    Inability: Not on file  . Transportation needs:    Medical: Not on file    Non-medical: Not on file  Tobacco Use  . Smoking status: Former Smoker    Packs/day: 1.00    Years: 25.00    Pack years: 25.00    Types: Cigarettes    Last attempt to quit: 09/29/2014    Years since quitting: 2.7  . Smokeless tobacco: Never Used  . Tobacco comment: quit smoking Jan 2016  Substance and Sexual Activity  . Alcohol use: Yes    Alcohol/week: 0.0 oz    Comment: occ  . Drug use: No  . Sexual activity: Yes    Birth control/protection: Post-menopausal  Lifestyle  . Physical activity:    Days per week: Not on file    Minutes per session: Not on file  . Stress: Not on file  Relationships  . Social connections:    Talks on phone: Not on file    Gets together: Not on file    Attends religious service: Not on file    Active member of club or organization: Not on file    Attends meetings of clubs or organizations: Not on file    Relationship status: Not on file  . Intimate partner violence:    Fear of current or ex partner: Not on file    Emotionally abused: Not on file    Physically abused: Not on file    Forced sexual activity: Not on file  Other Topics Concern  . Not on file  Social History Narrative  . Not on file    FAMILY HISTORY: Family History  Problem Relation Age of Onset  . Heart murmur Mother   . Cancer Mother 32       carcinoid  tumor   . Heart murmur Sister        x2  . Cancer Maternal Aunt        lung cancer  . Cancer Paternal Aunt        breast cancer   . Cancer Maternal Aunt        gastric cancer   . Cancer Cousin        lung cancer  . Cancer Cousin        bone cancer   . Heart attack Maternal Uncle   . Heart attack Paternal Uncle   . Hypertension Mother   . Hypertension Son   . Stroke Neg Hx     ALLERGIES:  is allergic to carbocaine [mepivacaine hcl]; penicillins; sulfa antibiotics; amlodipine; and latex.  MEDICATIONS:  Current Outpatient Medications  Medication Sig Dispense Refill  . ALPRAZolam (XANAX) 0.5 MG tablet Take 0.5-1 mg by mouth 2 (two) times daily as needed for anxiety. Take 1 tablet (0.5 mg) by mouth if needed during the day, and 2 tablets (1 mg) if needed at bedtime    . anastrozole (ARIMIDEX) 1 MG tablet  Take 1 tablet (1 mg total) by mouth daily. 90 tablet 3  . Ascorbic Acid (VITAMIN C) 1000 MG tablet Take 1,000 mg by mouth daily.    . calcium carbonate (OS-CAL) 600 MG TABS tablet Take 600 mg by mouth daily with breakfast.    . chlorpheniramine-HYDROcodone (TUSSIONEX) 10-8 MG/5ML SUER Take 5 mLs by mouth every 12 (twelve) hours as needed for cough. 140 mL 0  . ezetimibe (ZETIA) 10 MG tablet Take 10 mg by mouth at bedtime.     . furosemide (LASIX) 40 MG tablet Take 40 mg by mouth daily as needed for fluid or edema (leg swelling). Reported on 06/29/2015  1  . gabapentin (NEURONTIN) 600 MG tablet Take 600 mg by mouth 3 (three) times daily.      . Lactobacillus (PROBIOTIC ACIDOPHILUS PO) Take 1 tablet by mouth every morning.    . meclizine (ANTIVERT) 25 MG tablet Take 25 mg by mouth 3 (three) times daily as needed for dizziness (vertigo). Reported on 06/29/2015    . methocarbamol (ROBAXIN) 500 MG tablet Take 1 tablet (500 mg total) by mouth every 6 (six) hours as needed for muscle spasms. 40 tablet 1  . OVER THE COUNTER MEDICATION Take 1 drop by mouth daily as needed (dry eyes). OTC  lubricating eye drops    . potassium chloride SA (K-DUR,KLOR-CON) 20 MEQ tablet Take 20 mEq by mouth daily as needed (with furosemide (lasix) dose).    Marland Kitchen PROAIR HFA 108 (90 Base) MCG/ACT inhaler Inhale 2 puffs into the lungs every 4 (four) hours as needed.  2  . ramipril (ALTACE) 10 MG capsule Take 10 mg by mouth daily.    . rosuvastatin (CRESTOR) 20 MG tablet Take 20 mg by mouth daily.    Marland Kitchen venlafaxine (EFFEXOR-XR) 150 MG 24 hr capsule Take 150 mg by mouth 2 (two) times daily. Take 1 capsule (150 mg) by mouth daily with breakfast and lunch     No current facility-administered medications for this visit.     REVIEW OF SYSTEMS:   Constitutional: Denies fevers, chills (+) good appetite  Eyes: Denies blurriness of vision, double vision or watery eyes Ears, nose, mouth, throat, and face: Denies mucositis or sore throat Respiratory: Denies cough, dyspnea or wheezes Cardiovascular: Denies palpitation, chest discomfort or lower extremity swelling Gastrointestinal:  Denies nausea, heartburn or change in bowel habits Skin: Denies abnormal skin rashes Lymphatics: Denies new lymphadenopathy or easy bruising Neurological:Denies numbness, tingling or new weaknesses MSK: (+) Left abdominal muscle pain and back pain, stable Behavioral/Psych: Mood is stable, no new changes  All other systems were reviewed with the patient and are negative.  PHYSICAL EXAMINATION: ECOG PERFORMANCE STATUS: 1 - Symptomatic but completely ambulatory  Vitals:   06/29/17 1348 06/29/17 1406  BP: (!) 182/88 (!) 141/74  Pulse: 86 69  Resp: 17   Temp: 98 F (36.7 C)   SpO2: 99%    Filed Weights   06/29/17 1348  Weight: 136 lb 6.4 oz (61.9 kg)    GENERAL:alert, no distress and comfortable SKIN: skin color, texture, turgor are normal, no rashes or significant lesions EYES: normal, conjunctiva are pink and non-injected, sclera clear OROPHARYNX:no exudate, no erythema and lips, buccal mucosa, and tongue normal  NECK:  supple, thyroid normal size, non-tender, without nodularity LYMPH:  no palpable lymphadenopathy in the cervical, axillary or inguinal LUNGS: clear to auscultation and percussion with normal breathing effort HEART: regular rate and no lower extremity edema (+) slight heart murmur  ABDOMEN:abdomen soft, non-tender  and normal bowel sounds  (+) slight tenderness on the end of the left rib cage.   Musculoskeletal:no cyanosis of digits and no clubbing  PSYCH: alert & oriented x 3 with fluent speech NEURO: no focal motor/sensory deficits Breasts: s/p bilateral nipple sparing mastectomy and implant placement, surgical incision in right breast has well-healed, the interest staging in left breast has healed well.  Palpitation of both breasts reviewed no palpable mass or axillary adenopathy.    LABORATORY DATA:  I have reviewed the data as listed CBC Latest Ref Rng & Units 06/29/2017 09/01/2016 03/14/2016  WBC 3.9 - 10.3 K/uL 8.6 7.0 10.0  Hemoglobin 11.6 - 15.9 g/dL 11.1(L) 10.4(L) 11.2(L)  Hematocrit 34.8 - 46.6 % 34.1(L) 31.6(L) 34.0(L)  Platelets 145 - 400 K/uL 194 240 217    CMP Latest Ref Rng & Units 06/29/2017 09/01/2016 03/14/2016  Glucose 70 - 140 mg/dL 95 96 118  BUN 7 - 26 mg/dL 13 16.8 19.6  Creatinine 0.60 - 1.10 mg/dL 1.05 1.3(H) 1.3(H)  Sodium 136 - 145 mmol/L 140 146(H) 144  Potassium 3.5 - 5.1 mmol/L 4.3 4.2 4.2  Chloride 98 - 109 mmol/L 104 - -  CO2 22 - 29 mmol/L _0 Calcium 8.4 - 10.4 mg/dL 10.4 9.9 10.1  Total Protein 6.4 - 8.3 g/dL 6.7 6.7 7.1  Total Bilirubin 0.2 - 1.2 mg/dL 0.3 <0.22 0.30  Alkaline Phos 40 - 150 U/L 70 63 82  AST 5 - 34 U/L _1 ALT 0 - 55 U/L _2 PATHOLOGY REPORT  Diagnosis 09/13/2014 1. Breast, lumpectomy, Right - HIGH GRADE DUCTAL CARCINOMA IN SITU WITH NECROSIS AND CALCIFICATIONS, SEE COMMENT. - TUMOR IS FOCALLY PRESENT AT LATERAL MARGIN. - TUMOR IS BROADLY PRESENT AT MEDIAL MARGIN. - TUMOR IS 1 MM FROM NEAREST ANTERIOR, POSTERIOR,  AND INFERIOR MARGIN. - PREVIOUS BIOPSY SITE. - SEE TUMOR SYNOPTIC TEMPLATE BELOW. 2. Lymph node, sentinel, biopsy, Right axilla #1 - ONE LYMPH NODE, NEGATIVE FOR TUMOR (0/1). 3. Lymph node, sentinel, biopsy, Right axilla #2 - ONE LYMPH NODE, NEGATIVE FOR TUMOR (0/1). 4. Lymph node, sentinel, biopsy, Right axilla #3 - ONE LYMPH NODE, NEGATIVE FOR TUMOR (0/1). 5. Lymph node, sentinel, biopsy, Right axilla #4 - ONE LYMPH NODE, NEGATIVE FOR TUMOR (0/1). 6. Lymph node, sentinel, biopsy, Right axilla #5 - ONE LYMPH NODE, NEGATIVE FOR TUMOR (0/1).  Diagnosis 09/29/2014 1. Breast, excision, new anterior margin (right) - FIBROCYSTIC CHANGES WITH CALCIFICATIONS. - BIOPSY SITE REACTION. - NO MALIGNANCY IDENTIFIED. 2. Breast, excision, new medial margin (right) - HIGH GRADE DUCTAL CARCINOMA IN SITU, 1.0 CM IN GREATEST DIMENSION. - DUCTAL CARCINOMA IN SITU FOCALLY INVOLVES LATERAL MARGIN. - BIOPSY SITE REACTION. 3. Breast, excision, new inferior margin (right) - HIGH GRADE DUCTAL CARCINOMA IN SITU, 3.0 CM IN GREATEST DIMENSION. - DUCTAL CARCINOMA IN SITU FOCALLY LESS THAN 0.1 CM FROM POSTERIOR, INFERIOR, AND LATERAL MARGINS. - BIOPSY SITE REACTION. - SEE ONCOLOGY TABLE BELOW. 4. Breast, excision, new posterior margin (right) - FIBROCYSTIC CHANGES WITH CALCIFICATIONS. - NO MALIGNANCY IDENTIFIED. - BIOPSY SITE REACTION. 5. Breast, excision, new lateral margin (right) - HIGH GRADE DUCTAL CARCINOMA IN SITU, 2.0 CM IN GREATEST DIMENSION. - DUCTAL CARCINOMA IN SITU FOCALLY INVOLVES LATERAL MARGIN. - DUCTAL CARCINOMA IN SITU FOCALLY LESS THAN 0.1 CM FROM POSTERIOR MARGIN.   Diagnosis Breast, simple mastectomy, right 01/25/2015  - DUCTAL CARCINOMA IN SITU WITH CALCIFICATIONS, HIGH GRADE, SPANNING 9.0 CM. - LOBULAR NEOPLASIA (LOBULAR CARCINOMA IN SITU) -  FIBROCYSTIC CHANGES WITH ADENOSIS AND CALCIFICATIONS. - THE SURGICAL RESECTION MARGINS ARE NEGATIVE FOR DUCTAL CARCINOMA. - THERE IS NO EVIDENCE  OF CARCINOMA IN 1 OF 1 LYMPH NODE (0/1).  Diagnosis 06/20/2015  Breast, left, needle core biopsy - INVASIVE DUCTAL CARCINOMA WITH EXTRACELLULAR MUCIN. - SEE COMMENT. Microscopic Comment The carcinoma appears grade 2. A breast prognostic profile will be performed and the results reported separately. The results were called to [facility] on 06/21/15. (JBK:kh 06/21/15) Results: IMMUNOHISTOCHEMICAL AND MORPHOMETRIC ANALYSIS PERFORMED MANUALLY Estrogen Receptor: 100%, POSITIVE, STRONG STAINING INTENSITY Progesterone Receptor: 0%, NEGATIVE Proliferation Marker Ki67: 30%  Results: HER2 - NEGATIVE RATIO OF HER2/CEP17 SIGNALS 1.08 AVERAGE HER2 COPY NUMBER PER CELL 1.30  Diagnosis 07/05/2015 1. Breast, simple mastectomy, Left INVASIVE DUCTAL CARCINOMA WITH EXTRACELLULAR MUCIN, GRADE 2, SPINNING 2.0 CM THE CARCINOMA IS BROADLY PRESENTED LESS THAN 1 MM FROM THE DEEP MARGIN LOBULAR NEOPLASIA (LOBUAR CARCINOMA IN SITU) FIBROCYSTIC CHANGES WITH ADENOSIS AND CALCIFICATIONS 2. Lymph node, sentinel, biopsy, Left axillary ONE BENIGN LYMPH NODE (0/1) 3. Breast, excision, Left additional anterior margin over tumor BENIGN BREAST TISSUE, NEGATIVE FOR CARCINOMA 4. Lymph node, sentinel, biopsy, Left axillary ONE BENIGN LYMPH NODE (0/1) 5. Lymph node, sentinel, biopsy, Left axillary ONE BENIGN LYMPH NODE (0/1)  Microscopic Comment 1. BREAST, INVASIVE TUMOR, WITH LYMPH NODES PRESENT Specimen, including laterality and lymph node sampling (sentinel, non-sentinel): Left breast, sentinel lymph nodes Procedure: Simple mastectomy Histologic type: Ductal carcinoma Grade: 2 Tubule formation: 3 Nuclear pleomorphism: 1 Mitotic:2 Tumor size (gross measurement or glass slide measurement): 2.0 cm Margins: Invasive, distance to closest margin: Less than 1 mm from the deep margin In-situ, distance to closest margin: 0.4cm from the anteriro margin If margin positive, focally or broadly:  Broadly Lymphovascular invasion: Negative Ductal carcinoma in situ: Present Grade: 2 Extensive intraductal component: moderate Lobular neoplasia: Present Tumor focality: Focal Treatment effect: Negative If present, treatment effect in breast tissue, lymph nodes or both: NA Extent of tumor: Skin: Negative Nipple: Negative Skeletal muscle: Negative Lymph nodes: Examined: 3 Sentinel 0 Non-sentinel 0 Total Lymph nodes with metastasis: 0 Isolated tumor cells (< 0.2 mm): NA Micrometastasis: (> 0.2 mm and < 2.0 mm): NA Macrometastasis: (> 2.0 mm): NA Extracapsular extension: NA Breast prognostic profile: [id number] Estrogen receptor: 100% Progesterone receptor: 0% Her 2 neu: Negative Ki-67: 30% Non-neoplastic breast: Fibrocystic changes with adenosis and calcifications TNM: pT2, pN0 Comments: PR and Her has been reorded.  1. PROGNOSTIC INDICATORS Results: IMMUNOHISTOCHEMICAL AND MORPHOMETRIC ANALYSIS PERFORMED MANUALLY Progesterone Receptor: 0%, NEGATIVE  Results: HER2 - NEGATIVE RATIO OF HER2/CEP17 SIGNALS 1.21 AVERAGE HER2 COPY NUMBER PER CELL 1.75  ONCOTYPE DX RS 28 (intermediate risk), which predicts 10 year risk of distant recurrence 18% with tamoxifen alone  RADIOGRAPHIC STUDIES: I have personally reviewed the radiological images as listed and agreed with the findings in the report.  Bone density scan 09/05/2015 Osteopenia, T score of -2.2 at left femoral neck 10 year probability of fracture: Major osteoporotic fracture 12.3%, hip fracture 2.3% No significant change from 05/2013 (T score -2.1)  Diagnostic mammogram and ultrasound of the left breast and axilla 06/19/2015 Impression: Suspicious of malignancy The 1.3 cm oval solid mass with an indistinct margin in the upper inner quadrant posterior depth of the left breast is suspicious of malignancy. An ultrasound-guided biopsy is recommended.  ASSESSMENT:  67 y.o.  female with   1.  Breast cancer of upper inner  quadrant of left breast,  Invasive ductal carcinoma, pT2N0M0, stage IIA, G2, ER+/PR-/HER2-, ONCOTYPE RS 28  --  I previously discussed her surgical path result in details -the Oncotype Dx result was reviewed with her in details. She has intermedia risk based on the recurrence score, which predicts 10 year distant recurrence after 5 years of tamoxifen 18%. The benefit of chemotherapy in the intermedia risk group is small and controversial. The predicted benefit from chemotherapy to reduce her risk of distant recurrence in her case is probably around 4% in 10-years. Given her  other medical comorbidities, and her reluctance to take chemotherapy, we decided not to pursue adjuvant chemotherapy, after a long discussion with patient and her husband.  -Giving the strong ER and PR expression in her tumor and her postmenopausal status, I recommended adjuvant endocrine therapy with aromatase inhibitor for 7-10 years. -She tried exemestane first, but developed skin rash after months. Skin rash has resolved after stopped exemestane. -She has been tolerating anastrozole well, we'll continue, plan for 7-10 years.  -We'll continue breast cancer surveillance, she had bilateral mastectomy, no need mammogram. I'll continue following her with lab and exam.  -Lab results reviewed with her, very mild anemia, CMP is otherwise normal -She is clinically doing well, breast exam was unremarkable today. There is no concerns of recurrence. -F/u in 8 months, she will f/u with Dr. Barry Dienes this summer.   2. 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   3 Bone health Her last DEXA scan in 08/2015 showed osteopenia, stable comparing to 2015 -she previously received biphosphonate, per pt  -I previously encouraged her to continue calcium and vitamin D replacement -We previously discussed that aromatase inhibitor may weak her bone -If she has significant decrease in her  bone density, I would consider switching anastrozole to tamoxifen -She does take calcium and I encouraged her to take vitamins like centrum silver.  -She will have DEXA this summer, her PCP with order  4. Arthritis, chronic back pain, hypertension, CKD stage III  -She will continue follow-up with her primary care physician  -Her renal function is stable overall. She follows up with Dr. Brigitte Pulse every 6 months  -Cr is 1.05 today, she is doing well, drinking plenty of fluids  -BP elevated today, re-checked and it decreased. I advised her to f/u with her PCP. She states it is WNL at home  5. Anemia -Possibly anemia of chronic disease secondary to CKD -Her iron study, folic acid, and N05 levels were normal in September 2017. -I recommended she take a multivitamin with iron in it  6. Cancer Screening -She states she had a colonoscopy in Jan 2019, recommendation was f/u in 5 years    Plan -Continue anastrozole 1 mg once daily -Start multivitamin with iron  -I'll see her back in 8 months with lab, she will see Dr. Barry Dienes in 3-4 months  -DEXA in June 2019, her PCP will order   All questions were answered. The patient knows to call the clinic with any problems, questions or concerns.  I spent 20 minutes counseling the patient face to face. The total time spent in the appointment was 25 minutes and more than 50% was on counseling.  This document serves as a record of services personally performed by Truitt Merle, MD. It was created on her behalf by Theresia Bough, a trained medical scribe. The creation of this record is based on the scribe's personal observations and the provider's statements to them.   I have reviewed the above documentation for accuracy and completeness, and I  agree with the above.    Truitt Merle, MD 06/29/2017

## 2017-06-29 ENCOUNTER — Encounter: Payer: Self-pay | Admitting: Hematology

## 2017-06-29 ENCOUNTER — Ambulatory Visit: Payer: PPO | Admitting: Hematology

## 2017-06-29 ENCOUNTER — Inpatient Hospital Stay (HOSPITAL_BASED_OUTPATIENT_CLINIC_OR_DEPARTMENT_OTHER): Payer: PPO | Admitting: Hematology

## 2017-06-29 ENCOUNTER — Inpatient Hospital Stay: Payer: PPO | Attending: Hematology

## 2017-06-29 ENCOUNTER — Telehealth: Payer: Self-pay

## 2017-06-29 ENCOUNTER — Other Ambulatory Visit: Payer: PPO

## 2017-06-29 VITALS — BP 141/74 | HR 69 | Temp 98.0°F | Resp 17 | Ht 63.0 in | Wt 136.4 lb

## 2017-06-29 DIAGNOSIS — Z79811 Long term (current) use of aromatase inhibitors: Secondary | ICD-10-CM | POA: Diagnosis not present

## 2017-06-29 DIAGNOSIS — I10 Essential (primary) hypertension: Secondary | ICD-10-CM | POA: Diagnosis not present

## 2017-06-29 DIAGNOSIS — M1990 Unspecified osteoarthritis, unspecified site: Secondary | ICD-10-CM

## 2017-06-29 DIAGNOSIS — C50411 Malignant neoplasm of upper-outer quadrant of right female breast: Secondary | ICD-10-CM

## 2017-06-29 DIAGNOSIS — Z9013 Acquired absence of bilateral breasts and nipples: Secondary | ICD-10-CM | POA: Insufficient documentation

## 2017-06-29 DIAGNOSIS — N183 Chronic kidney disease, stage 3 (moderate): Secondary | ICD-10-CM | POA: Insufficient documentation

## 2017-06-29 DIAGNOSIS — C50212 Malignant neoplasm of upper-inner quadrant of left female breast: Secondary | ICD-10-CM | POA: Diagnosis not present

## 2017-06-29 DIAGNOSIS — G8929 Other chronic pain: Secondary | ICD-10-CM

## 2017-06-29 DIAGNOSIS — Z853 Personal history of malignant neoplasm of breast: Secondary | ICD-10-CM

## 2017-06-29 DIAGNOSIS — M549 Dorsalgia, unspecified: Secondary | ICD-10-CM | POA: Diagnosis not present

## 2017-06-29 DIAGNOSIS — D631 Anemia in chronic kidney disease: Secondary | ICD-10-CM | POA: Diagnosis not present

## 2017-06-29 DIAGNOSIS — Z170 Estrogen receptor positive status [ER+]: Secondary | ICD-10-CM | POA: Insufficient documentation

## 2017-06-29 DIAGNOSIS — M8580 Other specified disorders of bone density and structure, unspecified site: Secondary | ICD-10-CM

## 2017-06-29 LAB — CBC WITH DIFFERENTIAL/PLATELET
Basophils Absolute: 0.1 10*3/uL (ref 0.0–0.1)
Basophils Relative: 1 %
Eosinophils Absolute: 0.3 10*3/uL (ref 0.0–0.5)
Eosinophils Relative: 3 %
HCT: 34.1 % — ABNORMAL LOW (ref 34.8–46.6)
Hemoglobin: 11.1 g/dL — ABNORMAL LOW (ref 11.6–15.9)
Lymphocytes Relative: 26 %
Lymphs Abs: 2.2 10*3/uL (ref 0.9–3.3)
MCH: 30.2 pg (ref 25.1–34.0)
MCHC: 32.6 g/dL (ref 31.5–36.0)
MCV: 92.9 fL (ref 79.5–101.0)
Monocytes Absolute: 0.5 10*3/uL (ref 0.1–0.9)
Monocytes Relative: 6 %
Neutro Abs: 5.5 10*3/uL (ref 1.5–6.5)
Neutrophils Relative %: 64 %
Platelets: 194 10*3/uL (ref 145–400)
RBC: 3.67 MIL/uL — ABNORMAL LOW (ref 3.70–5.45)
RDW: 13.2 % (ref 11.2–14.5)
WBC: 8.6 10*3/uL (ref 3.9–10.3)

## 2017-06-29 LAB — COMPREHENSIVE METABOLIC PANEL
ALT: 23 U/L (ref 0–55)
AST: 29 U/L (ref 5–34)
Albumin: 3.9 g/dL (ref 3.5–5.0)
Alkaline Phosphatase: 70 U/L (ref 40–150)
Anion gap: 8 (ref 3–11)
BUN: 13 mg/dL (ref 7–26)
CO2: 28 mmol/L (ref 22–29)
Calcium: 10.4 mg/dL (ref 8.4–10.4)
Chloride: 104 mmol/L (ref 98–109)
Creatinine, Ser: 1.05 mg/dL (ref 0.60–1.10)
GFR calc Af Amer: >60 mL/min (ref >60)
GFR calc non Af Amer: 53 mL/min — ABNORMAL LOW (ref >60)
Glucose, Bld: 95 mg/dL (ref 70–140)
Potassium: 4.3 mmol/L (ref 3.5–5.1)
Sodium: 140 mmol/L (ref 136–145)
Total Bilirubin: 0.3 mg/dL (ref 0.2–1.2)
Total Protein: 6.7 g/dL (ref 6.4–8.3)

## 2017-06-29 NOTE — Telephone Encounter (Signed)
Printed avs and calender of upcoming appointment. Per 4/1 los

## 2017-07-03 ENCOUNTER — Other Ambulatory Visit: Payer: Self-pay

## 2017-07-03 DIAGNOSIS — C50411 Malignant neoplasm of upper-outer quadrant of right female breast: Secondary | ICD-10-CM

## 2017-07-03 MED ORDER — ANASTROZOLE 1 MG PO TABS: 1.0000 mg | ORAL_TABLET | Freq: Every day | ORAL | 3 refills | Status: DC

## 2017-07-21 DIAGNOSIS — M7061 Trochanteric bursitis, right hip: Secondary | ICD-10-CM | POA: Diagnosis not present

## 2017-07-21 DIAGNOSIS — Z5181 Encounter for therapeutic drug level monitoring: Secondary | ICD-10-CM | POA: Diagnosis not present

## 2017-07-21 DIAGNOSIS — Z79899 Other long term (current) drug therapy: Secondary | ICD-10-CM | POA: Diagnosis not present

## 2017-07-21 DIAGNOSIS — M5417 Radiculopathy, lumbosacral region: Secondary | ICD-10-CM | POA: Diagnosis not present

## 2017-07-21 DIAGNOSIS — M961 Postlaminectomy syndrome, not elsewhere classified: Secondary | ICD-10-CM | POA: Diagnosis not present

## 2017-07-21 DIAGNOSIS — G894 Chronic pain syndrome: Secondary | ICD-10-CM | POA: Diagnosis not present

## 2017-09-07 DIAGNOSIS — H3554 Dystrophies primarily involving the retinal pigment epithelium: Secondary | ICD-10-CM | POA: Diagnosis not present

## 2017-09-18 ENCOUNTER — Other Ambulatory Visit: Payer: Self-pay | Admitting: Family Medicine

## 2017-09-18 ENCOUNTER — Ambulatory Visit
Admission: RE | Admit: 2017-09-18 | Discharge: 2017-09-18 | Disposition: A | Discharge status: Home / Self Care | Payer: PPO | Source: Ambulatory Visit | Attending: Family Medicine | Admitting: Family Medicine

## 2017-09-18 DIAGNOSIS — S99911A Unspecified injury of right ankle, initial encounter: Secondary | ICD-10-CM | POA: Diagnosis not present

## 2017-09-18 DIAGNOSIS — R52 Pain, unspecified: Secondary | ICD-10-CM

## 2017-09-18 DIAGNOSIS — M7989 Other specified soft tissue disorders: Secondary | ICD-10-CM | POA: Diagnosis not present

## 2017-09-18 DIAGNOSIS — M25571 Pain in right ankle and joints of right foot: Secondary | ICD-10-CM | POA: Diagnosis not present

## 2017-09-21 DIAGNOSIS — S93401A Sprain of unspecified ligament of right ankle, initial encounter: Secondary | ICD-10-CM | POA: Diagnosis not present

## 2017-09-21 DIAGNOSIS — Z853 Personal history of malignant neoplasm of breast: Secondary | ICD-10-CM | POA: Diagnosis not present

## 2017-10-13 DIAGNOSIS — M961 Postlaminectomy syndrome, not elsewhere classified: Secondary | ICD-10-CM | POA: Diagnosis not present

## 2017-10-13 DIAGNOSIS — M545 Low back pain: Secondary | ICD-10-CM | POA: Diagnosis not present

## 2017-10-13 DIAGNOSIS — G894 Chronic pain syndrome: Secondary | ICD-10-CM | POA: Diagnosis not present

## 2017-10-13 DIAGNOSIS — M7918 Myalgia, other site: Secondary | ICD-10-CM | POA: Diagnosis not present

## 2017-10-14 DIAGNOSIS — W57XXXA Bitten or stung by nonvenomous insect and other nonvenomous arthropods, initial encounter: Secondary | ICD-10-CM | POA: Diagnosis not present

## 2017-10-14 DIAGNOSIS — R5383 Other fatigue: Secondary | ICD-10-CM | POA: Diagnosis not present

## 2017-10-14 DIAGNOSIS — S30860A Insect bite (nonvenomous) of lower back and pelvis, initial encounter: Secondary | ICD-10-CM | POA: Diagnosis not present

## 2017-10-14 DIAGNOSIS — R634 Abnormal weight loss: Secondary | ICD-10-CM | POA: Diagnosis not present

## 2017-10-19 DIAGNOSIS — H3554 Dystrophies primarily involving the retinal pigment epithelium: Secondary | ICD-10-CM | POA: Diagnosis not present

## 2017-10-19 DIAGNOSIS — H2513 Age-related nuclear cataract, bilateral: Secondary | ICD-10-CM | POA: Diagnosis not present

## 2017-11-02 DIAGNOSIS — Z0000 Encounter for general adult medical examination without abnormal findings: Secondary | ICD-10-CM | POA: Diagnosis not present

## 2017-11-02 DIAGNOSIS — E782 Mixed hyperlipidemia: Secondary | ICD-10-CM | POA: Diagnosis not present

## 2017-11-02 DIAGNOSIS — C50919 Malignant neoplasm of unspecified site of unspecified female breast: Secondary | ICD-10-CM | POA: Diagnosis not present

## 2017-11-02 DIAGNOSIS — M797 Fibromyalgia: Secondary | ICD-10-CM | POA: Diagnosis not present

## 2017-11-02 DIAGNOSIS — R634 Abnormal weight loss: Secondary | ICD-10-CM | POA: Diagnosis not present

## 2017-11-02 DIAGNOSIS — N183 Chronic kidney disease, stage 3 (moderate): Secondary | ICD-10-CM | POA: Diagnosis not present

## 2017-11-02 DIAGNOSIS — K219 Gastro-esophageal reflux disease without esophagitis: Secondary | ICD-10-CM | POA: Diagnosis not present

## 2017-11-02 DIAGNOSIS — D72829 Elevated white blood cell count, unspecified: Secondary | ICD-10-CM | POA: Diagnosis not present

## 2017-11-02 DIAGNOSIS — G9050 Complex regional pain syndrome I, unspecified: Secondary | ICD-10-CM | POA: Diagnosis not present

## 2017-11-02 DIAGNOSIS — I129 Hypertensive chronic kidney disease with stage 1 through stage 4 chronic kidney disease, or unspecified chronic kidney disease: Secondary | ICD-10-CM | POA: Diagnosis not present

## 2017-11-02 DIAGNOSIS — F322 Major depressive disorder, single episode, severe without psychotic features: Secondary | ICD-10-CM | POA: Diagnosis not present

## 2017-11-02 DIAGNOSIS — M810 Age-related osteoporosis without current pathological fracture: Secondary | ICD-10-CM | POA: Diagnosis not present

## 2017-12-16 ENCOUNTER — Encounter: Payer: Self-pay | Admitting: *Deleted

## 2017-12-16 DIAGNOSIS — M8588 Other specified disorders of bone density and structure, other site: Secondary | ICD-10-CM | POA: Diagnosis not present

## 2017-12-16 DIAGNOSIS — M810 Age-related osteoporosis without current pathological fracture: Secondary | ICD-10-CM | POA: Diagnosis not present

## 2017-12-31 DIAGNOSIS — F3181 Bipolar II disorder: Secondary | ICD-10-CM | POA: Diagnosis not present

## 2018-01-05 DIAGNOSIS — Z5181 Encounter for therapeutic drug level monitoring: Secondary | ICD-10-CM | POA: Diagnosis not present

## 2018-01-05 DIAGNOSIS — M961 Postlaminectomy syndrome, not elsewhere classified: Secondary | ICD-10-CM | POA: Diagnosis not present

## 2018-01-05 DIAGNOSIS — G894 Chronic pain syndrome: Secondary | ICD-10-CM | POA: Diagnosis not present

## 2018-01-05 DIAGNOSIS — Z79899 Other long term (current) drug therapy: Secondary | ICD-10-CM | POA: Diagnosis not present

## 2018-02-26 ENCOUNTER — Telehealth: Payer: Self-pay | Admitting: Hematology

## 2018-02-26 ENCOUNTER — Telehealth: Payer: Self-pay

## 2018-02-26 NOTE — Telephone Encounter (Signed)
Patient calls to reschedule her appointments scheduled for Monday 03/01/18, high priority schedule message was sent.

## 2018-02-26 NOTE — Telephone Encounter (Signed)
Tried to reach regarding 12/10 left a message

## 2018-03-01 ENCOUNTER — Ambulatory Visit: Payer: PPO | Admitting: Hematology

## 2018-03-01 ENCOUNTER — Other Ambulatory Visit: Payer: PPO

## 2018-03-08 ENCOUNTER — Telehealth: Payer: Self-pay | Admitting: Hematology

## 2018-03-08 NOTE — Telephone Encounter (Signed)
Called patient per 12/9 sch message - left message for patient to call back if still wanting to r/s

## 2018-03-09 ENCOUNTER — Other Ambulatory Visit: Payer: PPO

## 2018-03-09 ENCOUNTER — Ambulatory Visit: Payer: PPO | Admitting: Hematology

## 2018-03-10 ENCOUNTER — Telehealth: Payer: Self-pay | Admitting: Hematology

## 2018-03-10 NOTE — Telephone Encounter (Signed)
Called patient per 12/10 sch message - left message for patient to call back to r./s

## 2018-03-16 DIAGNOSIS — M5417 Radiculopathy, lumbosacral region: Secondary | ICD-10-CM | POA: Diagnosis not present

## 2018-03-16 DIAGNOSIS — G894 Chronic pain syndrome: Secondary | ICD-10-CM | POA: Diagnosis not present

## 2018-03-16 DIAGNOSIS — M545 Low back pain: Secondary | ICD-10-CM | POA: Diagnosis not present

## 2018-03-16 DIAGNOSIS — M961 Postlaminectomy syndrome, not elsewhere classified: Secondary | ICD-10-CM | POA: Diagnosis not present

## 2018-03-25 ENCOUNTER — Telehealth: Payer: Self-pay | Admitting: Hematology

## 2018-03-25 NOTE — Telephone Encounter (Signed)
Tried calling patient per 12/6 voicemail log to reschedule patient appointment for Dr. Burr Medico.   Called three separate times on three different days.

## 2018-04-20 ENCOUNTER — Telehealth: Payer: Self-pay | Admitting: Hematology

## 2018-04-20 DIAGNOSIS — H3554 Dystrophies primarily involving the retinal pigment epithelium: Secondary | ICD-10-CM | POA: Diagnosis not present

## 2018-04-20 NOTE — Telephone Encounter (Signed)
Tried to reach regarding 1/31 I did leave a message

## 2018-04-28 NOTE — Progress Notes (Signed)
[name]   [phone] [phone]   Clinic Follow up Note   Patient Care Team: Mayra Neer, MD as PCP - General (Family Medicine) Holley Bouche, NP (Inactive) as Nurse Practitioner (Nurse Practitioner) [name], MD as Consulting Physician (General Surgery) Truitt Merle, MD as Consulting Physician (Hematology) Sylvan Cheese, NP as Nurse Practitioner (Hematology and Oncology) Crissie Reese, MD as Consulting Physician (Plastic Surgery)  Date of Service:  04/30/2018  CHIEF COMPLAINT: Follow up bilateral breast cancer   SUMMARY OF ONCOLOGIC HISTORY: Oncology History   Breast cancer of upper-outer quadrant of RIGHT female breast [facility])   Staging form: Breast, AJCC 7th Edition Clinical stage from 09/06/2014: Stage 0 (Tis (DCIS), N0, M0)  Pathologic stage from 01/25/2015: Stage 0 (Tis (DCIS), N0, cM0)  ------  Breast cancer of upper-inner quadrant of LEFT female breast ([place])   Staging form: Breast, AJCC 7th Edition Clinical stage from 06/20/2015: Stage IA (T1c, N0, M0)      Pathologic stage from 07/05/2015: Stage IIA (T2, N0, cM0)      Breast cancer of upper-outer quadrant of right female breast ([place])   08/23/2014 Mammogram    Mammogram and ultrasound showed a 3cm cystic lesion at 8:30 of right breast, and a 7 mm and a 5 mm increased density lesion at 10:00 of the right breast.    08/29/2014 Initial Biopsy    Right breast core needle bx x 2: high-grade DCIS, with focus of mildly suspicious for early stromal invasion. ER- (0%), PR- (0%).     08/29/2014 Clinical Stage    Stage 0: Tis N0    09/13/2014 Surgery    Right lumpectomy/SLNB ([place]): High grade DCIS with necrosis and calcfications, positive margins. 5 LN removed and negative for malignancy (0/5). Grade 3.    09/13/2014 Pathologic Stage    Stage 0: Tis N0    09/29/2014 Surgery    Re-excision for positive margins; multiple breast margins re-excised, high-grade DCIS,  measuring 1.0 cm, 3.0 cm, 2.0 cm, some margins are still positive or very close.     01/25/2015 Definitive Surgery    Right nipple-sparing mastectomy Barry Dienes): DCIS with calcifications, high grade, spans 9 cm, LCIS, fibrocystic changes with adenosis and calcifications, negative margins. 1 LN removed and negative. Imeediate reconstruction    03/23/2015 Survivorship    Survivorship care plan completed and mailed to patient in lieu of in person visit at request of patient.     Breast cancer of upper-inner quadrant of left female breast ([place])   06/28/2013 Imaging    DEXA scan: Osteopenia [facility])    06/19/2015 Mammogram    Diagnostic mammogram and ultrasound showed a 1.3 cm in the upper inner quadrant of left breast, and a 0.9 mm simple cyst. Axilla was negative.    06/20/2015 Receptors her2    ER 100% positive, PR negative, Ki-67 30%, HER-2 negative    06/20/2015 Initial Biopsy    The left breast upper inner quadrant mass biopsy showed invasive ductal carcinoma, grade 2    06/20/2015 Initial Diagnosis    Breast cancer of upper-inner quadrant of left female breast ([place])    07/05/2015 Surgery    Left breast nipple-sparing mastectomy and sentinel lymph node biopsy with immediate reconstruction Barry Dienes); reconstruction with Dr. Harlow Mares at [facility]    07/05/2015 Pathology Results    Left mastectomy: Invasive ductal carcinoma with extracellular mucin, grade 2, spanning 2.0 cm, margins were negative, lobular carcinoma in situ, 3 sentinel lymph nodes negative. PR repeated  and remains negative. HER2 remains neg (ratio 1.21)    07/05/2015 Pathologic Stage    pT2, pN0: Stage IIA     07/05/2015 Oncotype testing    RS 28 (intermediate risk), which predicts 10 year risk of distant recurrence 18% with tamoxifen alone    07/30/2015 - 08/30/2015 Anti-estrogen oral therapy    Adjuvant exemestane 25 mg once daily, stopped after one month due to skin rash.     09/01/2015 -  Anti-estrogen oral therapy     Switched to anastrozole 67m daily.  Planned duration of treatment : 5-10 years (Burr Medico      CURRENT THERAPY:  Anastrozole 1 mg daily, started on 09/28/2015  INTERVAL HISTORY:  Connie KILGOURis here for a follow up of b/l breast cancer. She was last seen by me 9 months ago. She presents to the clinic today with her husband. She notes she is doing well and her appetite has improved. She has gained 5 pound since 10/2018. She denies. She notes she is tolerating Anastrozole. She does have OA pian mainly in her hands. She takes Gabapentin for her nerve pain. She takes Lasix for her LE swelling occasionally. She plans to follow up with Dr. BBarry Dienesin between each visit.    REVIEW OF SYSTEMS:   Constitutional: Denies fevers, chills or abnormal weight loss (+) adequate appetite, weight gain Eyes: Denies blurriness of vision Ears, nose, mouth, throat, and face: Denies mucositis or sore throat Respiratory: Denies cough, dyspnea or wheezes Cardiovascular: Denies palpitation, chest discomfort or lower extremity swelling Gastrointestinal:  Denies nausea, heartburn or change in bowel habits Skin: Denies abnormal skin rashes MSK: (+) OA mainly in hands Lymphatics: Denies new lymphadenopathy or easy bruising Neurological:Denies numbness, tingling or new weaknesses Behavioral/Psych: Mood is stable, no new changes  All other systems were reviewed with the patient and are negative.  MEDICAL HISTORY:  Past Medical History:  Diagnosis Date  . Anxiety    takes Xanax daily as needed  . Arthritis   . Bilateral lower extremity edema    takes Furosemide daily as needed  . Breast cancer ([facility] oncologist-  dr YTruitt Merle-  right upper-outer quadrant    dx May 2016--- Stage 0  (Tis,N0,M0)  DCIS  Right breast---  09-13-2014  s/p  radioactive seed/ partial mastectomy / sln bx  . Chronic fatigue syndrome   . Chronic low back pain   . Complication of anesthesia    spinal cord stimulator- to be off for surgery    . Depression    takes Effexor daily  . Family history of adverse reaction to anesthesia    mom hard to wake up  . Fibromyalgia   . Headache(784.0)    couple of times a week  . Heart murmur   . History of bronchitis > 89yr[age]  . History of colon polyps    benign  . History of drug overdose    oxycontin  and oxycodone  Sept 2015--  now has narcan injection prescription  . History of DVT of lower extremity    2008--  BILATERAL  . History of hiatal hernia   . HTN (hypertension)    takes Amlodipine and Ramipril daily  . Hyperlipidemia    takes Zetia and Crestor  daily  . Joint pain   . Mitral valve prolapse    MILD /   PER PT ASYMPTOMATIC  . Muscle spasm    takes Zanaflex daily as needed  . Osteoporosis   . Peripheral neuropathy  takes Neurontin daily  . Peripheral vascular disease (HCC)    hx dvt's  . RSD (reflex sympathetic dystrophy)    left wrist and forearm from a fx  . RSD (reflex sympathetic dystrophy)   . Sjogren's disease ([place])   . Vertigo    takes Meclizine daily     SURGICAL HISTORY: Past Surgical History:  Procedure Laterality Date  . BACK SURGERY    . BREAST RECONSTRUCTION WITH PLACEMENT OF TISSUE EXPANDER AND FLEX HD (ACELLULAR HYDRATED DERMIS) Right 01/25/2015   Procedure: RIGHT BREAST RECONSTRUCTION WITH PLACEMENT OF IMPLANT AND ACELLULAR DERMAL MATRIX ;  Surgeon: Crissie Reese, MD;  Location: [place];  Service: Plastics;  Laterality: Right;  . BREAST RECONSTRUCTION WITH PLACEMENT OF TISSUE EXPANDER AND FLEX HD (ACELLULAR HYDRATED DERMIS) Left 07/05/2015   Procedure: LEFT BREAST RECONSTRUCTION WITH PLACEMENT OF TISSUE EXPANDER AND  ACELLULAR DERMAL MATRIX ;  Surgeon: Crissie Reese, MD;  Location: [place];  Service: Plastics;  Laterality: Left;  . COLONOSCOPY    . ESOPHAGOGASTRODUODENOSCOPY    . eye lid surgery Bilateral   . FOOT SURGERY Bilateral 1996   MORTON'S NEUROMA  . INNER EAR SURGERY Right   . LUMBAR DISC SURGERY    . MASTECTOMY Right 01/25/2015    nipple sparing   . NASAL SINUS SURGERY    . NIPPLE SPARING MASTECTOMY/SENTINAL LYMPH NODE BIOPSY/RECONSTRUCTION/PLACEMENT OF TISSUE EXPANDER Left 07/05/2015   Procedure: LEFT NIPPLE SPARING MASTECTOMY WITH SENTINAL LYMPH NODE BIOPSY ;  Surgeon: [name], MD;  Location: [place];  Service: General;  Laterality: Left;  . ORIF LEFT WRIST FX'S/  LEFT CARPAL TUNNEL RELEASE  1999  . POSTERIOR LUMBAR FUSION  01-30-2009   L4--5 Laminectmy w/ decompression/  bilateral L4--5 microdiskectomy and fusion  . RADIOACTIVE SEED GUIDED PARTIAL MASTECTOMY WITH AXILLARY SENTINEL LYMPH NODE BIOPSY Right 09/13/2014   Procedure: RADIOACTIVE SEED GUIDED PARTIAL MASTECTOMY WITH AXILLARY SENTINEL LYMPH NODE BIOPSY;  Surgeon: [name], MD;  Location: [place];  Service: General;  Laterality: Right;  . RE-EXCISION OF BREAST LUMPECTOMY Right 09/29/2014   Procedure: RE-EXCISION OF BREAST LUMPECTOMY;  Surgeon: [name], MD;  Location: [place];  Service: General;  Laterality: Right;  . RIGHT CARPAL TUNNEL RELEASE/  TRIGGER RELEASE RIGHT MIDDLE FINGER  06-18-2006  . RIGHT INFERIOR PARATHYROIDECTOMY  04-07-2006  . SIMPLE MASTECTOMY WITH AXILLARY SENTINEL NODE BIOPSY Right 01/25/2015   Procedure: RIGHT NIPPLE SPARING MASTECTOMY;  Surgeon: [name], MD;  Location: [place];  Service: General;  Laterality: Right;  . SPINAL CORD STIMULATOR IMPLANT  2013  . TONSILLECTOMY  as child    I have reviewed the social history and family history with the patient and they are unchanged from previous note.  ALLERGIES:  is allergic to carbocaine [mepivacaine hcl]; penicillins; sulfa antibiotics; amlodipine; and latex.  MEDICATIONS:  Current Outpatient Medications  Medication Sig Dispense Refill  . ALPRAZolam (XANAX) 0.5 MG tablet Take 0.5-1 mg by mouth 2 (two) times daily as needed for anxiety. Take 1 tablet (0.5 mg) by mouth if needed during the day, and 2 tablets (1 mg) if needed at bedtime    .  anastrozole (ARIMIDEX) 1 MG tablet Take 1 tablet (1 mg total) by mouth daily. 90 tablet 3  . Ascorbic Acid (VITAMIN C) 1000 MG tablet Take 1,000 mg by mouth daily.    . calcium carbonate (OS-CAL) 600 MG TABS tablet Take 600 mg by mouth daily with breakfast.    . ezetimibe (ZETIA) 10 MG tablet Take  10 mg by mouth at bedtime.     . furosemide (LASIX) 40 MG tablet Take 40 mg by mouth daily as needed for fluid or edema (leg swelling). Reported on 06/29/2015  1  . gabapentin (NEURONTIN) 600 MG tablet Take 600 mg by mouth 3 (three) times daily.      . Lactobacillus (PROBIOTIC ACIDOPHILUS PO) Take 1 tablet by mouth every morning.    . methocarbamol (ROBAXIN) 500 MG tablet Take 1 tablet (500 mg total) by mouth every 6 (six) hours as needed for muscle spasms. 40 tablet 1  . OVER THE COUNTER MEDICATION Take 1 drop by mouth daily as needed (dry eyes). OTC lubricating eye drops    . potassium chloride SA (K-DUR,KLOR-CON) 20 MEQ tablet Take 20 mEq by mouth daily as needed (with furosemide (lasix) dose).    Marland Kitchen PROAIR HFA 108 (90 Base) MCG/ACT inhaler Inhale 2 puffs into the lungs every 4 (four) hours as needed.  2  . ramipril (ALTACE) 10 MG capsule Take 10 mg by mouth daily.    . rosuvastatin (CRESTOR) 20 MG tablet Take 20 mg by mouth daily.    Marland Kitchen venlafaxine (EFFEXOR-XR) 150 MG 24 hr capsule Take 150 mg by mouth 2 (two) times daily. Take 1 capsule (150 mg) by mouth daily with breakfast and lunch     No current facility-administered medications for this visit.     PHYSICAL EXAMINATION: ECOG PERFORMANCE STATUS: 1 - Symptomatic but completely ambulatory  Vitals:   04/30/18 1348 04/30/18 1406  BP: (!) 143/51 (!) 159/70  Pulse: 73   Resp: 16   Temp: 98 F (36.7 C)   SpO2: 100%    Filed Weights   04/30/18 1348  Weight: 131 lb 14.4 oz (59.8 kg)    GENERAL:alert, no distress and comfortable SKIN: skin color, texture, turgor are normal, no rashes or significant lesions EYES: normal, Conjunctiva are pink  and non-injected, sclera clear OROPHARYNX:no exudate, no erythema and lips, buccal mucosa, and tongue normal  NECK: supple, thyroid normal size, non-tender, without nodularity LYMPH:  no palpable lymphadenopathy in the cervical, axillary or inguinal LUNGS: clear to auscultation and percussion with normal breathing effort HEART: regular rate & rhythm and no murmurs and no lower extremity edema ABDOMEN:abdomen soft, non-tender and normal bowel sounds Musculoskeletal:no cyanosis of digits and no clubbing  NEURO: alert & oriented x 3 with fluent speech, no focal motor/sensory deficits BREAST: (+) S/p b/l mastectomy and reconstruction with implants: Surgical incisions healed well, no palpable mass or adenopathy.   LABORATORY DATA:  I have reviewed the data as listed CBC Latest Ref Rng & Units 04/30/2018 06/29/2017 09/01/2016  WBC 4.0 - 10.5 K/uL 8.3 8.6 7.0  Hemoglobin 12.0 - 15.0 g/dL 10.1(L) 11.1(L) 10.4(L)  Hematocrit 36.0 - 46.0 % 31.8(L) 34.1(L) 31.6(L)  Platelets 150 - 400 K/uL 191 194 240     CMP Latest Ref Rng & Units 04/30/2018 06/29/2017 09/01/2016  Glucose 70 - 99 mg/dL 81 95 96  BUN 8 - 23 mg/dL 19 13 16.8  Creatinine 0.44 - 1.00 mg/dL 1.24(H) 1.05 1.3(H)  Sodium 135 - 145 mmol/L 140 140 146(H)  Potassium 3.5 - 5.1 mmol/L 4.4 4.3 4.2  Chloride 98 - 111 mmol/L 103 104 -  CO2 22 - 32 mmol/L 29 28 28   Calcium 8.9 - 10.3 mg/dL 10.1 10.4 9.9  Total Protein 6.5 - 8.1 g/dL 7.0 6.7 6.7  Total Bilirubin 0.3 - 1.2 mg/dL 0.4 0.3 <0.22  Alkaline Phos 38 - 126 U/L 83  70 63  AST 15 - 41 U/L 35 29 29  ALT 0 - 44 U/L 25 23 23       RADIOGRAPHIC STUDIES: I have personally reviewed the radiological images as listed and agreed with the findings in the report. No results found.   ASSESSMENT & PLAN:  Connie Bennett is a 68 y.o. female with   1.  Breast cancer of upper inner quadrant of left breast,  Invasive ductal carcinoma, pT2N0M0, stage IIA, G2, ER+/PR-/HER2-, ONCOTYPE RS 28  -She was  diagnosed in 05/2015. She is s/p left breast mastectomy.  -Oncotype result and adjuvant chemo was discussed but decided not to pursue  -She started anti-estrogen therapy with exemestane in 07/2015, but stopped after 1 month use due to skin rash. She started anastrozole in 08/2015, tolerating well, plan for 7 years.  -She is clinically doing well. Lab reviewed, her CBC and CMP are within normal limits except Hg at 10.2 and cr at 1.24, GFR at 11. Her physical exam was unremarkable. There is no clinical concern for recurrence. -Her mild anemia is likely related to her CKD.  -Her last colonoscopy was in 03/2017.  -We'll continue breast cancer surveillance, she had bilateral mastectomy, no need mammogram. -continue Anastrozole  -F/u in 8 months and she will f/u with Dr. Barry Dienes in interim.     2. 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 She is s/p right lumpectomy and multiple re-excision which resulted in her ultimately having a right breast mastectomy which cured her.   3. Bone health Her last DEXA scan in 08/2015 showed osteopenia, stable comparing to 2015 -she previously received bisphosphonate, per pt  -She had DEXA through her PCP, she will have her PCP send copy of results to me.  -I encouraged her to take Multivitamin, calcium and Vitamin D.   4. Arthritis, chronic back pain, hypertension, CKD stage III  -She will continue follow-up with her primary care physician  -She follows up with Dr. Brigitte Pulse every 6 months  -I advised her to f/u with her PCP. -Stable OA, Cr at 1.24 today. BP at 159/70 today (04/30/18). I encouraged her to continue medications, limit Lasix use and drink plenty of water.   5. Anemia -Possibly anemia of chronic disease secondary to CKD -Her iron study, folic acid, and Y56 levels were normal in September 2017. -Hg at 10.1 today (04/30/18), does not require treatment at this time.    6. Cancer Screening -She states  she had a colonoscopy in Jan 2019, recommendation was f/u in 5 years    Plan -Continue anastrozole 1 mg once daily -Lab and f/u 8 months, she will see Dr. Barry Dienes in 4 months    No problem-specific Assessment & Plan notes found for this encounter.   No orders of the defined types were placed in this encounter.  All questions were answered. The patient knows to call the clinic with any problems, questions or concerns. No barriers to learning was detected. I spent 15 minutes counseling the patient face to face. The total time spent in the appointment was 20 minutes and more than 50% was on counseling and review of test results     Truitt Merle, MD 04/30/2018   I, Joslyn Devon, am acting as scribe for Truitt Merle, MD.   I have reviewed the above documentation for accuracy and completeness, and I agree with the above.

## 2018-04-29 ENCOUNTER — Telehealth: Payer: Self-pay | Admitting: Hematology

## 2018-04-29 NOTE — Telephone Encounter (Signed)
Left message - called patient per 1/28 sch message -

## 2018-04-30 ENCOUNTER — Inpatient Hospital Stay (HOSPITAL_BASED_OUTPATIENT_CLINIC_OR_DEPARTMENT_OTHER): Payer: PPO | Admitting: Hematology

## 2018-04-30 ENCOUNTER — Encounter: Payer: Self-pay | Admitting: Hematology

## 2018-04-30 ENCOUNTER — Inpatient Hospital Stay: Payer: PPO | Attending: Family Medicine

## 2018-04-30 VITALS — BP 159/70 | HR 73 | Temp 98.0°F | Resp 16 | Ht 61.0 in | Wt 131.9 lb

## 2018-04-30 DIAGNOSIS — Z170 Estrogen receptor positive status [ER+]: Secondary | ICD-10-CM | POA: Insufficient documentation

## 2018-04-30 DIAGNOSIS — Z86718 Personal history of other venous thrombosis and embolism: Secondary | ICD-10-CM

## 2018-04-30 DIAGNOSIS — D649 Anemia, unspecified: Secondary | ICD-10-CM | POA: Insufficient documentation

## 2018-04-30 DIAGNOSIS — C50212 Malignant neoplasm of upper-inner quadrant of left female breast: Secondary | ICD-10-CM | POA: Diagnosis not present

## 2018-04-30 DIAGNOSIS — N183 Chronic kidney disease, stage 3 (moderate): Secondary | ICD-10-CM | POA: Insufficient documentation

## 2018-04-30 DIAGNOSIS — I10 Essential (primary) hypertension: Secondary | ICD-10-CM | POA: Insufficient documentation

## 2018-04-30 DIAGNOSIS — C50411 Malignant neoplasm of upper-outer quadrant of right female breast: Secondary | ICD-10-CM | POA: Insufficient documentation

## 2018-04-30 DIAGNOSIS — Z79811 Long term (current) use of aromatase inhibitors: Secondary | ICD-10-CM | POA: Diagnosis not present

## 2018-04-30 DIAGNOSIS — Z79899 Other long term (current) drug therapy: Secondary | ICD-10-CM | POA: Diagnosis not present

## 2018-04-30 DIAGNOSIS — Z9013 Acquired absence of bilateral breasts and nipples: Secondary | ICD-10-CM | POA: Insufficient documentation

## 2018-04-30 LAB — COMPREHENSIVE METABOLIC PANEL
ALT: 25 U/L (ref 0–44)
AST: 35 U/L (ref 15–41)
Albumin: 3.9 g/dL (ref 3.5–5.0)
Alkaline Phosphatase: 83 U/L (ref 38–126)
Anion gap: 8 (ref 5–15)
BUN: 19 mg/dL (ref 8–23)
CO2: 29 mmol/L (ref 22–32)
Calcium: 10.1 mg/dL (ref 8.9–10.3)
Chloride: 103 mmol/L (ref 98–111)
Creatinine, Ser: 1.24 mg/dL — ABNORMAL HIGH (ref 0.44–1.00)
GFR calc Af Amer: 51 mL/min — ABNORMAL LOW (ref >60)
GFR calc non Af Amer: 44 mL/min — ABNORMAL LOW (ref >60)
Glucose, Bld: 81 mg/dL (ref 70–99)
Potassium: 4.4 mmol/L (ref 3.5–5.1)
Sodium: 140 mmol/L (ref 135–145)
Total Bilirubin: 0.4 mg/dL (ref 0.3–1.2)
Total Protein: 7 g/dL (ref 6.5–8.1)

## 2018-04-30 LAB — CBC WITH DIFFERENTIAL/PLATELET
Abs Immature Granulocytes: 0.03 10*3/uL (ref 0.00–0.07)
Basophils Absolute: 0.1 10*3/uL (ref 0.0–0.1)
Basophils Relative: 1 %
Eosinophils Absolute: 0.3 10*3/uL (ref 0.0–0.5)
Eosinophils Relative: 4 %
HCT: 31.8 % — ABNORMAL LOW (ref 36.0–46.0)
Hemoglobin: 10.1 g/dL — ABNORMAL LOW (ref 12.0–15.0)
Immature Granulocytes: 0 %
Lymphocytes Relative: 24 %
Lymphs Abs: 2 10*3/uL (ref 0.7–4.0)
MCH: 30.4 pg (ref 26.0–34.0)
MCHC: 31.8 g/dL (ref 30.0–36.0)
MCV: 95.8 fL (ref 80.0–100.0)
Monocytes Absolute: 0.7 10*3/uL (ref 0.1–1.0)
Monocytes Relative: 9 %
Neutro Abs: 5.2 10*3/uL (ref 1.7–7.7)
Neutrophils Relative %: 62 %
Platelets: 191 10*3/uL (ref 150–400)
RBC: 3.32 MIL/uL — ABNORMAL LOW (ref 3.87–5.11)
RDW: 13.2 % (ref 11.5–15.5)
WBC: 8.3 10*3/uL (ref 4.0–10.5)
nRBC: 0 % (ref 0.0–0.2)

## 2018-04-30 MED ORDER — ANASTROZOLE 1 MG PO TABS: 1.0000 mg | ORAL_TABLET | Freq: Every day | ORAL | 3 refills | Status: DC

## 2018-05-31 DIAGNOSIS — E782 Mixed hyperlipidemia: Secondary | ICD-10-CM | POA: Diagnosis not present

## 2018-05-31 DIAGNOSIS — C50919 Malignant neoplasm of unspecified site of unspecified female breast: Secondary | ICD-10-CM | POA: Diagnosis not present

## 2018-05-31 DIAGNOSIS — G9050 Complex regional pain syndrome I, unspecified: Secondary | ICD-10-CM | POA: Diagnosis not present

## 2018-05-31 DIAGNOSIS — I129 Hypertensive chronic kidney disease with stage 1 through stage 4 chronic kidney disease, or unspecified chronic kidney disease: Secondary | ICD-10-CM | POA: Diagnosis not present

## 2018-05-31 DIAGNOSIS — D72829 Elevated white blood cell count, unspecified: Secondary | ICD-10-CM | POA: Diagnosis not present

## 2018-05-31 DIAGNOSIS — N183 Chronic kidney disease, stage 3 (moderate): Secondary | ICD-10-CM | POA: Diagnosis not present

## 2018-05-31 DIAGNOSIS — F322 Major depressive disorder, single episode, severe without psychotic features: Secondary | ICD-10-CM | POA: Diagnosis not present

## 2018-06-08 DIAGNOSIS — G894 Chronic pain syndrome: Secondary | ICD-10-CM | POA: Diagnosis not present

## 2018-06-08 DIAGNOSIS — M47816 Spondylosis without myelopathy or radiculopathy, lumbar region: Secondary | ICD-10-CM | POA: Diagnosis not present

## 2018-06-08 DIAGNOSIS — M7061 Trochanteric bursitis, right hip: Secondary | ICD-10-CM | POA: Diagnosis not present

## 2018-06-08 DIAGNOSIS — M7918 Myalgia, other site: Secondary | ICD-10-CM | POA: Diagnosis not present

## 2018-07-15 DIAGNOSIS — S52551A Other extraarticular fracture of lower end of right radius, initial encounter for closed fracture: Secondary | ICD-10-CM | POA: Diagnosis not present

## 2018-07-16 ENCOUNTER — Other Ambulatory Visit: Payer: Self-pay | Admitting: Orthopedic Surgery

## 2018-07-16 ENCOUNTER — Ambulatory Visit
Admission: RE | Admit: 2018-07-16 | Discharge: 2018-07-16 | Disposition: A | Discharge status: Home / Self Care | Payer: PPO | Source: Ambulatory Visit | Attending: Orthopedic Surgery | Admitting: Orthopedic Surgery

## 2018-07-16 ENCOUNTER — Other Ambulatory Visit: Payer: Self-pay

## 2018-07-16 DIAGNOSIS — M25531 Pain in right wrist: Secondary | ICD-10-CM

## 2018-07-20 ENCOUNTER — Other Ambulatory Visit: Payer: Self-pay | Admitting: Orthopedic Surgery

## 2018-07-20 DIAGNOSIS — S52551A Other extraarticular fracture of lower end of right radius, initial encounter for closed fracture: Secondary | ICD-10-CM | POA: Diagnosis not present

## 2018-07-20 DIAGNOSIS — S62001A Unspecified fracture of navicular [scaphoid] bone of right wrist, initial encounter for closed fracture: Secondary | ICD-10-CM

## 2018-07-21 DIAGNOSIS — F3181 Bipolar II disorder: Secondary | ICD-10-CM | POA: Diagnosis not present

## 2018-08-03 DIAGNOSIS — S52551A Other extraarticular fracture of lower end of right radius, initial encounter for closed fracture: Secondary | ICD-10-CM | POA: Diagnosis not present

## 2018-08-04 ENCOUNTER — Other Ambulatory Visit: Payer: Self-pay | Admitting: Orthopedic Surgery

## 2018-08-04 ENCOUNTER — Other Ambulatory Visit: Payer: Self-pay

## 2018-08-04 ENCOUNTER — Encounter (HOSPITAL_BASED_OUTPATIENT_CLINIC_OR_DEPARTMENT_OTHER): Payer: Self-pay | Admitting: *Deleted

## 2018-08-05 ENCOUNTER — Other Ambulatory Visit (HOSPITAL_COMMUNITY)
Admission: RE | Admit: 2018-08-05 | Discharge: 2018-08-05 | Disposition: A | Discharge status: Home / Self Care | Payer: PPO | Source: Ambulatory Visit | Attending: Orthopedic Surgery | Admitting: Orthopedic Surgery

## 2018-08-05 ENCOUNTER — Encounter (HOSPITAL_BASED_OUTPATIENT_CLINIC_OR_DEPARTMENT_OTHER)
Admission: RE | Admit: 2018-08-05 | Discharge: 2018-08-05 | Disposition: A | Discharge status: Home / Self Care | Payer: PPO | Source: Ambulatory Visit | Attending: Orthopedic Surgery | Admitting: Orthopedic Surgery

## 2018-08-05 ENCOUNTER — Other Ambulatory Visit: Payer: Self-pay

## 2018-08-05 DIAGNOSIS — Z01818 Encounter for other preprocedural examination: Secondary | ICD-10-CM | POA: Diagnosis not present

## 2018-08-05 DIAGNOSIS — I4510 Unspecified right bundle-branch block: Secondary | ICD-10-CM | POA: Insufficient documentation

## 2018-08-05 DIAGNOSIS — Z1159 Encounter for screening for other viral diseases: Secondary | ICD-10-CM | POA: Insufficient documentation

## 2018-08-05 DIAGNOSIS — R9431 Abnormal electrocardiogram [ECG] [EKG]: Secondary | ICD-10-CM | POA: Diagnosis not present

## 2018-08-05 LAB — BASIC METABOLIC PANEL
Anion gap: 11 (ref 5–15)
BUN: 24 mg/dL — ABNORMAL HIGH (ref 8–23)
CO2: 28 mmol/L (ref 22–32)
Calcium: 10.2 mg/dL (ref 8.9–10.3)
Chloride: 101 mmol/L (ref 98–111)
Creatinine, Ser: 1.58 mg/dL — ABNORMAL HIGH (ref 0.44–1.00)
GFR calc Af Amer: 38 mL/min — ABNORMAL LOW (ref >60)
GFR calc non Af Amer: 33 mL/min — ABNORMAL LOW (ref >60)
Glucose, Bld: 95 mg/dL (ref 70–99)
Potassium: 4.5 mmol/L (ref 3.5–5.1)
Sodium: 140 mmol/L (ref 135–145)

## 2018-08-05 NOTE — H&P (Signed)
Connie Bennett is an 68 y.o. female.   CC / Reason for Visit: Right wrist injury HPI: This patient presents for reevaluation, specifically for x-ray evaluation of her potentially unstable distal radius fracture in a cast.  She reports that she is continue to have pain in the cast, but finds it slightly expected.  HPI 07-20-18: This patient returns for early reevaluation, having had pain around her elbow in the middle of the night and having removed her sugar tong splint Sunday night.  She reconfirms for me previous history of RSD following treatment of her left wrist fracture a long time ago.  Since last visit, she did have a CT scan revealing the distal radius fracture, but affirming no scaphoid fracture.  HPI 07-15-18: This patient is a 68 year old female, previous patient of Dr. Sammuel Hines, with chronic pain management involvement, who presents for evaluation of right wrist injury that occurred when she fell onto an outstretched hand earlier this morning.  She sustained pain and swelling, worsened with finger movement.  She has a history of a left distal radius fracture that was treated with external fixation a number of years ago, and is my understanding it was quite comminuted.  She has taken oxycodone prior to evaluation today.  Past Medical History:  Diagnosis Date  . Anxiety    takes Xanax daily as needed  . Arthritis   . Bilateral lower extremity edema    takes Furosemide daily as needed  . Breast cancer [facility]) oncologist-  dr Truitt Merle--  right upper-outer quadrant    dx May 2016--- Stage 0  (Tis,N0,M0)  DCIS  Right breast---  09-13-2014  s/p  radioactive seed/ partial mastectomy / sln bx  . Chronic fatigue syndrome   . Chronic low back pain   . Complication of anesthesia    spinal cord stimulator- to be off for surgery  . Depression    takes Effexor daily  . Family history of adverse reaction to anesthesia    mom hard to wake up  . Fibromyalgia   . Headache(784.0)    couple of times  a week  . Heart murmur   . History of bronchitis > 60yrs ago  . History of colon polyps    benign  . History of drug overdose    oxycontin  and oxycodone  Sept 2015--  now has narcan injection prescription  . History of DVT of lower extremity    2008--  BILATERAL  . History of hiatal hernia   . HTN (hypertension)    takes Amlodipine and Ramipril daily  . Hyperlipidemia    takes Zetia and Crestor  daily  . Joint pain   . Mitral valve prolapse    MILD /   PER PT ASYMPTOMATIC  . Muscle spasm    takes Zanaflex daily as needed  . Osteoporosis   . Peripheral neuropathy    takes Neurontin daily  . Peripheral vascular disease (HCC)    hx dvt's  . RSD (reflex sympathetic dystrophy)    left wrist and forearm from a fx  . RSD (reflex sympathetic dystrophy)   . Sjogren's disease ([place])   . Vertigo    takes Meclizine daily     Past Surgical History:  Procedure Laterality Date  . BACK SURGERY    . BREAST RECONSTRUCTION WITH PLACEMENT OF TISSUE EXPANDER AND FLEX HD (ACELLULAR HYDRATED DERMIS) Right 01/25/2015   Procedure: RIGHT BREAST RECONSTRUCTION WITH PLACEMENT OF IMPLANT AND ACELLULAR DERMAL MATRIX ;  Surgeon: Crissie Reese,  MD;  Location: [place];  Service: Plastics;  Laterality: Right;  . BREAST RECONSTRUCTION WITH PLACEMENT OF TISSUE EXPANDER AND FLEX HD (ACELLULAR HYDRATED DERMIS) Left 07/05/2015   Procedure: LEFT BREAST RECONSTRUCTION WITH PLACEMENT OF TISSUE EXPANDER AND  ACELLULAR DERMAL MATRIX ;  Surgeon: Crissie Reese, MD;  Location: [place];  Service: Plastics;  Laterality: Left;  . COLONOSCOPY    . ESOPHAGOGASTRODUODENOSCOPY    . eye lid surgery Bilateral   . FOOT SURGERY Bilateral 1996   [facility]  . INNER EAR SURGERY Right   . LUMBAR DISC SURGERY    . MASTECTOMY Right 01/25/2015   nipple sparing   . NASAL SINUS SURGERY    . NIPPLE SPARING MASTECTOMY/SENTINAL LYMPH NODE BIOPSY/RECONSTRUCTION/PLACEMENT OF TISSUE EXPANDER Left 07/05/2015   Procedure: LEFT NIPPLE SPARING  MASTECTOMY WITH SENTINAL LYMPH NODE BIOPSY ;  Surgeon: [name], MD;  Location: [place];  Service: General;  Laterality: Left;  . ORIF LEFT WRIST FX'S/  LEFT CARPAL TUNNEL RELEASE  1999  . POSTERIOR LUMBAR FUSION  01-30-2009   L4--5 Laminectmy w/ decompression/  bilateral L4--5 microdiskectomy and fusion  . RADIOACTIVE SEED GUIDED PARTIAL MASTECTOMY WITH AXILLARY SENTINEL LYMPH NODE BIOPSY Right 09/13/2014   Procedure: RADIOACTIVE SEED GUIDED PARTIAL MASTECTOMY WITH AXILLARY SENTINEL LYMPH NODE BIOPSY;  Surgeon: [name], MD;  Location: [place];  Service: General;  Laterality: Right;  . RE-EXCISION OF BREAST LUMPECTOMY Right 09/29/2014   Procedure: RE-EXCISION OF BREAST LUMPECTOMY;  Surgeon: [name], MD;  Location: [place];  Service: General;  Laterality: Right;  . RIGHT CARPAL TUNNEL RELEASE/  TRIGGER RELEASE RIGHT MIDDLE FINGER  06-18-2006  . RIGHT INFERIOR PARATHYROIDECTOMY  04-07-2006  . SIMPLE MASTECTOMY WITH AXILLARY SENTINEL NODE BIOPSY Right 01/25/2015   Procedure: RIGHT NIPPLE SPARING MASTECTOMY;  Surgeon: [name], MD;  Location: [place];  Service: General;  Laterality: Right;  . SPINAL CORD STIMULATOR IMPLANT  2013  . TONSILLECTOMY  as child    Family History  Problem Relation Age of Onset  . Heart murmur Mother   . Cancer Mother 10       carcinoid tumor   . Hypertension Mother   . Heart murmur Sister        x2  . Cancer Maternal Aunt        lung cancer  . Cancer Paternal Aunt        breast cancer   . Cancer Maternal Aunt        gastric cancer   . Cancer Cousin        lung cancer  . Cancer Cousin        bone cancer   . Heart attack Maternal Uncle   . Heart attack Paternal Uncle   . Hypertension Son   . Stroke Neg Hx    Social History:  reports that she has been smoking cigarettes. She has a 6.25 pack-year smoking history. She has never used smokeless tobacco. She reports current alcohol use. She reports that she does not  use drugs.  Allergies:  Allergies  Allergen Reactions  . Carbocaine [Mepivacaine Hcl] Hives and Swelling  . Penicillins Nausea And Vomiting, Swelling and Rash    Has patient had a PCN reaction causing immediate rash, facial/tongue/throat swelling, SOB or lightheadedness with hypotension: Yes Has patient had a PCN reaction causing severe rash involving mucus membranes or skin necrosis: No Has patient had a PCN reaction that required hospitalization No Has patient had a PCN reaction occurring within  the last 10 years: Yes If all of the above answers are "NO", then may proceed with Cephalosporin use.  . Sulfa Antibiotics Nausea And Vomiting, Swelling, Rash and Other (See Comments)    Headache   . Amlodipine Other (See Comments)    Unspecified  . Latex Rash    Reaction to gloves and bandaids (paper tape is ok)    No medications prior to admission.    Results for orders placed or performed during the hospital encounter of 08/09/18 (from the past 48 hour(s))  Basic metabolic panel     Status: Abnormal   Collection Time: 08/05/18 12:22 PM  Result Value Ref Range   Sodium 140 135 - 145 mmol/L   Potassium 4.5 3.5 - 5.1 mmol/L   Chloride 101 98 - 111 mmol/L   CO2 28 22 - 32 mmol/L   Glucose, Bld 95 70 - 99 mg/dL   BUN 24 (H) 8 - 23 mg/dL   Creatinine, Ser 1.58 (H) 0.44 - 1.00 mg/dL   Calcium 10.2 8.9 - 10.3 mg/dL   GFR calc non Af Amer 33 (L) >60 mL/min   GFR calc Af Amer 38 (L) >60 mL/min   Anion gap 11 5 - 15    Comment: Performed at [facility], [address], [place], [place] [address]   No results found.  Review of Systems  All other systems reviewed and are negative.   Height 5\' 3"  (1.6 m), weight 58.1 kg. Physical Exam  Constitutional:  WD, WN, NAD HEENT:  NCAT, EOMI Neuro/Psych:  Alert & oriented to person, place, and time; appropriate mood & affect Lymphatic: No generalized UE edema or lymphadenopathy Extremities / MSK:  Both UE are normal with respect to  appearance, ranges of motion, joint stability, muscle strength/tone, sensation, & perfusion except as otherwise noted:  Short arm cast is intact, no ulcerations at the margin, intact light touch sensibility on the radial, ulnar, and median nerve distributions.  Fingers warm, wrist CR  Labs / Xrays:  4 views of the right wrist ordered and obtained today reveals a largely transverse metaphyseal fracture of the distal radius with increased dorsal angulation, measuring 21 on the lateral, 31 on the inclined lateral.  Assessment: 1.  Dorsally displaced and tilted largely extra-articular right distal radius fracture  Plan:  I discussed these findings with her.  I discussed the x-ray changes and recommend operative treatment to obtain and maintain a more acceptable alignment.  We can proceed on Monday at [facility] Day.  The details of the operative procedure were discussed with the patient.  Questions were invited and answered.  In addition to the goal of the procedure, the risks of the procedure to include but not limited to bleeding; infection; damage to the nerves or blood vessels that could result in bleeding, numbness, weakness, chronic pain, and the need for additional procedures; stiffness; the need for revision surgery; and anesthetic risks were reviewed.  No specific outcome was guaranteed or implied.  Informed consent was obtained.  [name], MD 08/05/2018, 3:02 PM

## 2018-08-05 NOTE — Progress Notes (Signed)
Ensure pre surgery drink given with instructions to complete by 0700 dos, pt verbalized understanding. 

## 2018-08-06 LAB — NOVEL CORONAVIRUS, NAA (HOSP ORDER, SEND-OUT TO REF LAB; TAT 18-24 HRS): SARS-CoV-2, NAA: NOT DETECTED

## 2018-08-09 ENCOUNTER — Ambulatory Visit (HOSPITAL_BASED_OUTPATIENT_CLINIC_OR_DEPARTMENT_OTHER): Payer: PPO | Admitting: Anesthesiology

## 2018-08-09 ENCOUNTER — Ambulatory Visit (HOSPITAL_BASED_OUTPATIENT_CLINIC_OR_DEPARTMENT_OTHER)
Admission: RE | Admit: 2018-08-09 | Discharge: 2018-08-09 | Disposition: A | Discharge status: Home / Self Care | Payer: PPO | Attending: Orthopedic Surgery | Admitting: Orthopedic Surgery

## 2018-08-09 ENCOUNTER — Other Ambulatory Visit: Payer: Self-pay

## 2018-08-09 ENCOUNTER — Ambulatory Visit (HOSPITAL_COMMUNITY): Payer: PPO

## 2018-08-09 ENCOUNTER — Encounter (HOSPITAL_BASED_OUTPATIENT_CLINIC_OR_DEPARTMENT_OTHER)
Admission: RE | Disposition: A | Discharge status: Home / Self Care | Payer: Self-pay | Source: Home / Self Care | Attending: Orthopedic Surgery

## 2018-08-09 ENCOUNTER — Encounter (HOSPITAL_BASED_OUTPATIENT_CLINIC_OR_DEPARTMENT_OTHER): Payer: Self-pay | Admitting: *Deleted

## 2018-08-09 DIAGNOSIS — K449 Diaphragmatic hernia without obstruction or gangrene: Secondary | ICD-10-CM | POA: Diagnosis not present

## 2018-08-09 DIAGNOSIS — M810 Age-related osteoporosis without current pathological fracture: Secondary | ICD-10-CM | POA: Insufficient documentation

## 2018-08-09 DIAGNOSIS — M1990 Unspecified osteoarthritis, unspecified site: Secondary | ICD-10-CM | POA: Insufficient documentation

## 2018-08-09 DIAGNOSIS — Z91040 Latex allergy status: Secondary | ICD-10-CM | POA: Insufficient documentation

## 2018-08-09 DIAGNOSIS — E892 Postprocedural hypoparathyroidism: Secondary | ICD-10-CM | POA: Diagnosis not present

## 2018-08-09 DIAGNOSIS — E785 Hyperlipidemia, unspecified: Secondary | ICD-10-CM | POA: Diagnosis not present

## 2018-08-09 DIAGNOSIS — I341 Nonrheumatic mitral (valve) prolapse: Secondary | ICD-10-CM | POA: Diagnosis not present

## 2018-08-09 DIAGNOSIS — F329 Major depressive disorder, single episode, unspecified: Secondary | ICD-10-CM | POA: Diagnosis not present

## 2018-08-09 DIAGNOSIS — Z800 Family history of malignant neoplasm of digestive organs: Secondary | ICD-10-CM | POA: Insufficient documentation

## 2018-08-09 DIAGNOSIS — R51 Headache: Secondary | ICD-10-CM | POA: Diagnosis not present

## 2018-08-09 DIAGNOSIS — G8918 Other acute postprocedural pain: Secondary | ICD-10-CM | POA: Diagnosis not present

## 2018-08-09 DIAGNOSIS — S52501D Unspecified fracture of the lower end of right radius, subsequent encounter for closed fracture with routine healing: Secondary | ICD-10-CM | POA: Diagnosis not present

## 2018-08-09 DIAGNOSIS — M545 Low back pain: Secondary | ICD-10-CM | POA: Diagnosis not present

## 2018-08-09 DIAGNOSIS — Z801 Family history of malignant neoplasm of trachea, bronchus and lung: Secondary | ICD-10-CM | POA: Insufficient documentation

## 2018-08-09 DIAGNOSIS — Z8249 Family history of ischemic heart disease and other diseases of the circulatory system: Secondary | ICD-10-CM | POA: Insufficient documentation

## 2018-08-09 DIAGNOSIS — Z419 Encounter for procedure for purposes other than remedying health state, unspecified: Secondary | ICD-10-CM

## 2018-08-09 DIAGNOSIS — Z86718 Personal history of other venous thrombosis and embolism: Secondary | ICD-10-CM | POA: Insufficient documentation

## 2018-08-09 DIAGNOSIS — W19XXXA Unspecified fall, initial encounter: Secondary | ICD-10-CM | POA: Insufficient documentation

## 2018-08-09 DIAGNOSIS — Z86010 Personal history of colonic polyps: Secondary | ICD-10-CM | POA: Insufficient documentation

## 2018-08-09 DIAGNOSIS — M797 Fibromyalgia: Secondary | ICD-10-CM | POA: Insufficient documentation

## 2018-08-09 DIAGNOSIS — S52551A Other extraarticular fracture of lower end of right radius, initial encounter for closed fracture: Secondary | ICD-10-CM | POA: Insufficient documentation

## 2018-08-09 DIAGNOSIS — C50411 Malignant neoplasm of upper-outer quadrant of right female breast: Secondary | ICD-10-CM | POA: Diagnosis not present

## 2018-08-09 DIAGNOSIS — Z9013 Acquired absence of bilateral breasts and nipples: Secondary | ICD-10-CM | POA: Insufficient documentation

## 2018-08-09 DIAGNOSIS — Z888 Allergy status to other drugs, medicaments and biological substances status: Secondary | ICD-10-CM | POA: Insufficient documentation

## 2018-08-09 DIAGNOSIS — R42 Dizziness and giddiness: Secondary | ICD-10-CM | POA: Diagnosis not present

## 2018-08-09 DIAGNOSIS — I739 Peripheral vascular disease, unspecified: Secondary | ICD-10-CM | POA: Insufficient documentation

## 2018-08-09 DIAGNOSIS — Z882 Allergy status to sulfonamides status: Secondary | ICD-10-CM | POA: Insufficient documentation

## 2018-08-09 DIAGNOSIS — Z808 Family history of malignant neoplasm of other organs or systems: Secondary | ICD-10-CM | POA: Insufficient documentation

## 2018-08-09 DIAGNOSIS — F419 Anxiety disorder, unspecified: Secondary | ICD-10-CM | POA: Diagnosis not present

## 2018-08-09 DIAGNOSIS — I4510 Unspecified right bundle-branch block: Secondary | ICD-10-CM | POA: Insufficient documentation

## 2018-08-09 DIAGNOSIS — R5382 Chronic fatigue, unspecified: Secondary | ICD-10-CM | POA: Insufficient documentation

## 2018-08-09 DIAGNOSIS — S52571A Other intraarticular fracture of lower end of right radius, initial encounter for closed fracture: Secondary | ICD-10-CM | POA: Diagnosis not present

## 2018-08-09 DIAGNOSIS — I10 Essential (primary) hypertension: Secondary | ICD-10-CM | POA: Insufficient documentation

## 2018-08-09 DIAGNOSIS — F17210 Nicotine dependence, cigarettes, uncomplicated: Secondary | ICD-10-CM | POA: Insufficient documentation

## 2018-08-09 DIAGNOSIS — Z884 Allergy status to anesthetic agent status: Secondary | ICD-10-CM | POA: Insufficient documentation

## 2018-08-09 DIAGNOSIS — Z803 Family history of malignant neoplasm of breast: Secondary | ICD-10-CM | POA: Insufficient documentation

## 2018-08-09 DIAGNOSIS — M3500 Sicca syndrome, unspecified: Secondary | ICD-10-CM | POA: Insufficient documentation

## 2018-08-09 DIAGNOSIS — Z880 Allergy status to penicillin: Secondary | ICD-10-CM | POA: Insufficient documentation

## 2018-08-09 DIAGNOSIS — C50211 Malignant neoplasm of upper-inner quadrant of right female breast: Secondary | ICD-10-CM | POA: Diagnosis not present

## 2018-08-09 DIAGNOSIS — G9050 Complex regional pain syndrome I, unspecified: Secondary | ICD-10-CM | POA: Insufficient documentation

## 2018-08-09 DIAGNOSIS — G629 Polyneuropathy, unspecified: Secondary | ICD-10-CM | POA: Diagnosis not present

## 2018-08-09 DIAGNOSIS — S52551D Other extraarticular fracture of lower end of right radius, subsequent encounter for closed fracture with routine healing: Secondary | ICD-10-CM | POA: Diagnosis not present

## 2018-08-09 DIAGNOSIS — Z853 Personal history of malignant neoplasm of breast: Secondary | ICD-10-CM | POA: Insufficient documentation

## 2018-08-09 HISTORY — PX: OPEN REDUCTION INTERNAL FIXATION (ORIF) DISTAL RADIAL FRACTURE: SHX5989

## 2018-08-09 SURGERY — OPEN REDUCTION INTERNAL FIXATION (ORIF) DISTAL RADIUS FRACTURE
Anesthesia: General | Site: Wrist | Laterality: Right

## 2018-08-09 MED ORDER — FENTANYL CITRATE (PF) 100 MCG/2ML IJ SOLN: 25.0000 ug | INTRAMUSCULAR | Status: DC | PRN

## 2018-08-09 MED ORDER — VANCOMYCIN HCL IN DEXTROSE 1-5 GM/200ML-% IV SOLN
1000.0000 mg | INTRAVENOUS | Status: AC
  Administered 2018-08-09: 1000 mg via INTRAVENOUS

## 2018-08-09 MED ORDER — ONDANSETRON HCL 4 MG/2ML IJ SOLN: 4.0000 mg | Freq: Once | INTRAMUSCULAR | Status: DC | PRN

## 2018-08-09 MED ORDER — PHENYLEPHRINE HCL (PRESSORS) 10 MG/ML IV SOLN
INTRAVENOUS | Status: DC | PRN
  Administered 2018-08-09: 120 ug via INTRAVENOUS
  Administered 2018-08-09: 80 ug via INTRAVENOUS
  Administered 2018-08-09 (×2): 40 ug via INTRAVENOUS

## 2018-08-09 MED ORDER — BUPIVACAINE LIPOSOME 1.3 % IJ SUSP
INTRAMUSCULAR | Status: DC | PRN
  Administered 2018-08-09: 10 mL via PERINEURAL

## 2018-08-09 MED ORDER — OXYCODONE HCL 5 MG/5ML PO SOLN: 5.0000 mg | Freq: Once | ORAL | Status: DC | PRN

## 2018-08-09 MED ORDER — FENTANYL CITRATE (PF) 100 MCG/2ML IJ SOLN
50.0000 ug | INTRAMUSCULAR | Status: DC | PRN
  Administered 2018-08-09: 100 ug via INTRAVENOUS

## 2018-08-09 MED ORDER — IBUPROFEN 200 MG PO TABS: 600.0000 mg | ORAL_TABLET | Freq: Four times a day (QID) | ORAL | 0 refills | Status: DC

## 2018-08-09 MED ORDER — LIDOCAINE HCL (CARDIAC) PF 100 MG/5ML IV SOSY
PREFILLED_SYRINGE | INTRAVENOUS | Status: DC | PRN
  Administered 2018-08-09: 30 mg via INTRAVENOUS

## 2018-08-09 MED ORDER — OXYCODONE HCL 5 MG PO TABS: 5.0000 mg | ORAL_TABLET | Freq: Once | ORAL | Status: DC | PRN

## 2018-08-09 MED ORDER — MIDAZOLAM HCL 2 MG/2ML IJ SOLN
INTRAMUSCULAR | Status: AC
  Filled 2018-08-09: qty 2

## 2018-08-09 MED ORDER — ACETAMINOPHEN 160 MG/5ML PO SOLN: 325.0000 mg | ORAL | Status: DC | PRN

## 2018-08-09 MED ORDER — MEPERIDINE HCL 25 MG/ML IJ SOLN: 6.2500 mg | INTRAMUSCULAR | Status: DC | PRN

## 2018-08-09 MED ORDER — VANCOMYCIN HCL IN DEXTROSE 1-5 GM/200ML-% IV SOLN
INTRAVENOUS | Status: AC
  Filled 2018-08-09: qty 200

## 2018-08-09 MED ORDER — ONDANSETRON HCL 4 MG/2ML IJ SOLN
INTRAMUSCULAR | Status: DC | PRN
  Administered 2018-08-09: 4 mg via INTRAVENOUS

## 2018-08-09 MED ORDER — POVIDONE-IODINE 10 % EX SWAB: 2.0000 "application " | Freq: Once | CUTANEOUS | Status: DC

## 2018-08-09 MED ORDER — EPHEDRINE SULFATE 50 MG/ML IJ SOLN
INTRAMUSCULAR | Status: DC | PRN
  Administered 2018-08-09: 15 mg via INTRAVENOUS
  Administered 2018-08-09 (×3): 10 mg via INTRAVENOUS

## 2018-08-09 MED ORDER — MIDAZOLAM HCL 2 MG/2ML IJ SOLN
1.0000 mg | INTRAMUSCULAR | Status: DC | PRN
  Administered 2018-08-09: 2 mg via INTRAVENOUS

## 2018-08-09 MED ORDER — KETOROLAC TROMETHAMINE 0.5 % OP SOLN
OPHTHALMIC | Status: AC
  Filled 2018-08-09: qty 5

## 2018-08-09 MED ORDER — ACETAMINOPHEN 325 MG PO TABS: 325.0000 mg | ORAL_TABLET | Freq: Four times a day (QID) | ORAL | Status: DC

## 2018-08-09 MED ORDER — FENTANYL CITRATE (PF) 100 MCG/2ML IJ SOLN
INTRAMUSCULAR | Status: AC
  Filled 2018-08-09: qty 2

## 2018-08-09 MED ORDER — BUPIVACAINE-EPINEPHRINE (PF) 0.5% -1:200000 IJ SOLN
INTRAMUSCULAR | Status: DC | PRN
  Administered 2018-08-09: 15 mL via PERINEURAL

## 2018-08-09 MED ORDER — BSS IO SOLN
INTRAOCULAR | Status: AC
  Filled 2018-08-09: qty 15

## 2018-08-09 MED ORDER — ACETAMINOPHEN 325 MG PO TABS: 325.0000 mg | ORAL_TABLET | ORAL | Status: DC | PRN

## 2018-08-09 MED ORDER — PROPOFOL 500 MG/50ML IV EMUL
INTRAVENOUS | Status: DC | PRN
  Administered 2018-08-09: 75 ug/kg/min via INTRAVENOUS

## 2018-08-09 MED ORDER — 0.9 % SODIUM CHLORIDE (POUR BTL) OPTIME
TOPICAL | Status: DC | PRN
  Administered 2018-08-09: 300 mL

## 2018-08-09 MED ORDER — SCOPOLAMINE 1 MG/3DAYS TD PT72: 1.0000 | MEDICATED_PATCH | Freq: Once | TRANSDERMAL | Status: DC | PRN

## 2018-08-09 MED ORDER — LACTATED RINGERS IV SOLN
INTRAVENOUS | Status: DC
  Administered 2018-08-09 (×2): via INTRAVENOUS

## 2018-08-09 SURGICAL SUPPLY — 83 items
APL PRP STRL LF DISP 70% ISPRP (MISCELLANEOUS) ×1
BIT DRILL 2 FAST STEP (BIT) ×1 IMPLANT
BIT DRILL 2.5X4 QC (BIT) ×1 IMPLANT
BLADE MINI RND TIP GREEN BEAV (BLADE) IMPLANT
BLADE SURG 15 STRL LF DISP TIS (BLADE) ×1 IMPLANT
BLADE SURG 15 STRL SS (BLADE) ×4
BNDG CMPR 9X4 STRL LF SNTH (GAUZE/BANDAGES/DRESSINGS) ×1
BNDG COHESIVE 2X5 TAN STRL LF (GAUZE/BANDAGES/DRESSINGS) IMPLANT
BNDG COHESIVE 4X5 TAN STRL (GAUZE/BANDAGES/DRESSINGS) ×2 IMPLANT
BNDG ESMARK 4X9 LF (GAUZE/BANDAGES/DRESSINGS) ×2 IMPLANT
BNDG GAUZE ELAST 4 BULKY (GAUZE/BANDAGES/DRESSINGS) ×2 IMPLANT
BRUSH SCRUB EZ PLAIN DRY (MISCELLANEOUS) ×1 IMPLANT
CANISTER SUCT 1200ML W/VALVE (MISCELLANEOUS) ×2 IMPLANT
CHLORAPREP W/TINT 26 (MISCELLANEOUS) ×2 IMPLANT
CORD BIPOLAR FORCEPS 12FT (ELECTRODE) ×2 IMPLANT
COVER BACK TABLE REUSABLE LG (DRAPES) ×2 IMPLANT
COVER MAYO STAND REUSABLE (DRAPES) ×2 IMPLANT
COVER WAND RF STERILE (DRAPES) IMPLANT
CUFF TOURN SGL QUICK 18X4 (TOURNIQUET CUFF) ×1 IMPLANT
CUFF TOURN SGL QUICK 24 (TOURNIQUET CUFF)
CUFF TRNQT CYL 24X4X16.5-23 (TOURNIQUET CUFF) IMPLANT
DRAPE C-ARM 42X72 X-RAY (DRAPES) ×2 IMPLANT
DRAPE EXTREMITY T 121X128X90 (DISPOSABLE) ×2 IMPLANT
DRAPE SURG 17X23 STRL (DRAPES) ×2 IMPLANT
DRSG ADAPTIC 3X8 NADH LF (GAUZE/BANDAGES/DRESSINGS) ×2 IMPLANT
DRSG EMULSION OIL 3X3 NADH (GAUZE/BANDAGES/DRESSINGS) ×1 IMPLANT
ELECT REM PT RETURN 9FT ADLT (ELECTROSURGICAL) ×2
ELECTRODE REM PT RTRN 9FT ADLT (ELECTROSURGICAL) ×1 IMPLANT
GAUZE SPONGE 4X4 12PLY STRL LF (GAUZE/BANDAGES/DRESSINGS) ×2 IMPLANT
GLOVE BIO SURGEON STRL SZ7.5 (GLOVE) ×1 IMPLANT
GLOVE BIOGEL PI IND STRL 7.0 (GLOVE) ×1 IMPLANT
GLOVE BIOGEL PI IND STRL 7.5 (GLOVE) IMPLANT
GLOVE BIOGEL PI IND STRL 8 (GLOVE) ×1 IMPLANT
GLOVE BIOGEL PI INDICATOR 7.0 (GLOVE) ×1
GLOVE BIOGEL PI INDICATOR 7.5 (GLOVE) ×1
GLOVE BIOGEL PI INDICATOR 8 (GLOVE) ×1
GLOVE ECLIPSE 6.5 STRL STRAW (GLOVE) ×1 IMPLANT
GLOVE SURG SS PI 6.5 STRL IVOR (GLOVE) ×1 IMPLANT
GLOVE SURG SS PI 7.5 STRL IVOR (GLOVE) ×1 IMPLANT
GLOVE SURG SYN 7.5  E (GLOVE) ×1
GLOVE SURG SYN 7.5 E (GLOVE) ×1 IMPLANT
GLOVE SURG SYN 7.5 PF PI (GLOVE) IMPLANT
GOWN STRL REUS W/ TWL LRG LVL3 (GOWN DISPOSABLE) ×2 IMPLANT
GOWN STRL REUS W/TWL LRG LVL3 (GOWN DISPOSABLE) ×2
GOWN STRL REUS W/TWL XL LVL3 (GOWN DISPOSABLE) ×3 IMPLANT
K-WIRE 1.6 (WIRE) ×8
K-WIRE FX5X1.6XNS BN SS (WIRE) ×4
KWIRE FX5X1.6XNS BN SS (WIRE) IMPLANT
NDL HYPO 25X1 1.5 SAFETY (NEEDLE) IMPLANT
NEEDLE HYPO 25X1 1.5 SAFETY (NEEDLE) IMPLANT
NS IRRIG 1000ML POUR BTL (IV SOLUTION) ×2 IMPLANT
PACK BASIN DAY SURGERY FS (CUSTOM PROCEDURE TRAY) ×2 IMPLANT
PADDING CAST ABS 4INX4YD NS (CAST SUPPLIES) ×1
PADDING CAST ABS COTTON 4X4 ST (CAST SUPPLIES) IMPLANT
PEG SUBCHONDRAL SMOOTH 2.0X18 (Peg) ×1 IMPLANT
PEG SUBCHONDRAL SMOOTH 2.0X20 (Peg) ×1 IMPLANT
PEG SUBCHONDRAL SMOOTH 2.0X22 (Peg) ×2 IMPLANT
PEG SUBCHONDRAL SMOOTH 2.0X24 (Peg) ×2 IMPLANT
PENCIL BUTTON HOLSTER BLD 10FT (ELECTRODE) ×2 IMPLANT
PLATE SHORT 21.6X48.9 NRRW RT (Plate) ×1 IMPLANT
RUBBERBAND STERILE (MISCELLANEOUS) IMPLANT
SCREW BN 12X3.5XNS CORT TI (Screw) IMPLANT
SCREW BN 13X3.5XNS CORT TI (Screw) IMPLANT
SCREW CORT 3.5X12 (Screw) ×2 IMPLANT
SCREW CORT 3.5X13 (Screw) ×2 IMPLANT
SCREW CORT 3.5X14 LNG (Screw) ×1 IMPLANT
SLEEVE SCD COMPRESS KNEE MED (MISCELLANEOUS) ×2 IMPLANT
SLING ARM FOAM STRAP LRG (SOFTGOODS) ×1 IMPLANT
SPLINT PLASTER CAST XFAST 3X15 (CAST SUPPLIES) IMPLANT
SPLINT PLASTER XTRA FASTSET 3X (CAST SUPPLIES) ×8
STOCKINETTE 6  STRL (DRAPES) ×1
STOCKINETTE 6 STRL (DRAPES) ×1 IMPLANT
SUCTION FRAZIER HANDLE 10FR (MISCELLANEOUS) ×1
SUCTION TUBE FRAZIER 10FR DISP (MISCELLANEOUS) ×1 IMPLANT
SUT VIC AB 2-0 PS2 27 (SUTURE) ×2 IMPLANT
SUT VICRYL 4-0 PS2 18IN ABS (SUTURE) IMPLANT
SUT VICRYL RAPIDE 4-0 (SUTURE) IMPLANT
SUT VICRYL RAPIDE 4/0 PS 2 (SUTURE) ×2 IMPLANT
SYR 10ML LL (SYRINGE) IMPLANT
SYR BULB 3OZ (MISCELLANEOUS) ×2 IMPLANT
TOWEL GREEN STERILE FF (TOWEL DISPOSABLE) ×2 IMPLANT
TUBE CONNECTING 20X1/4 (TUBING) ×2 IMPLANT
UNDERPAD 30X30 (UNDERPADS AND DIAPERS) ×2 IMPLANT

## 2018-08-09 NOTE — Transfer of Care (Signed)
Immediate Anesthesia Transfer of Care Note  Patient: Connie Bennett  Procedure(s) Performed: OPEN REDUCTION INTERNAL FIXATION (ORIF) DISTAL RADIAL FRACTURE (Right Wrist)  Patient Location: PACU  Anesthesia Type:MAC combined with regional for post-op pain  Level of Consciousness: awake, sedated and patient cooperative  Airway & Oxygen Therapy: Patient Spontanous Breathing and Patient connected to nasal cannula oxygen  Post-op Assessment: Report given to RN and Post -op Vital signs reviewed and stable  Post vital signs: Reviewed and stable  Last Vitals:  Vitals Value Taken Time  BP 98/66 08/09/2018 12:47 PM  Temp    Pulse 78 08/09/2018 12:50 PM  Resp 16 08/09/2018 12:50 PM  SpO2 98 % 08/09/2018 12:50 PM  Vitals shown include unvalidated device data.  Last Pain:  Vitals:   08/09/18 0948  TempSrc: Oral  PainSc: 5          Complications: No apparent anesthesia complications

## 2018-08-09 NOTE — Anesthesia Procedure Notes (Signed)
Procedure Name: MAC Date/Time: 08/09/2018 11:48 AM Performed by: Signe Colt, CRNA Pre-anesthesia Checklist: Patient identified, Emergency Drugs available, Suction available, Patient being monitored and Timeout performed Patient Re-evaluated:Patient Re-evaluated prior to induction Oxygen Delivery Method: Simple face mask

## 2018-08-09 NOTE — Anesthesia Preprocedure Evaluation (Signed)
Anesthesia Evaluation  Patient identified by MRN, date of birth, ID band Patient awake    Reviewed: Allergy & Precautions, NPO status , Patient's Chart, lab work & pertinent test results  Airway Mallampati: II  TM Distance: >3 FB Neck ROM: Full    Dental   Pulmonary sleep apnea , Current Smoker, former smoker,    Pulmonary exam normal        Cardiovascular hypertension, Pt. on medications + Peripheral Vascular Disease  Normal cardiovascular exam+ dysrhythmias + Valvular Problems/Murmurs MVP      Neuro/Psych  Headaches, Anxiety Depression  Neuromuscular disease    GI/Hepatic Neg liver ROS, hiatal hernia,   Endo/Other  negative endocrine ROS  Renal/GU negative Renal ROS  negative genitourinary   Musculoskeletal negative musculoskeletal ROS (+) Arthritis , Fibromyalgia -  Abdominal   Peds  Hematology negative hematology ROS (+)   Anesthesia Other Findings   Reproductive/Obstetrics negative OB ROS                             Anesthesia Physical  Anesthesia Plan  ASA: III  Anesthesia Plan: General   Post-op Pain Management: GA combined w/ Regional for post-op pain   Induction: Intravenous  PONV Risk Score and Plan: 3 and Ondansetron, Treatment may vary due to age or medical condition and Midazolam  Airway Management Planned: Oral ETT and LMA  Additional Equipment:   Intra-op Plan:   Post-operative Plan: Extubation in OR  Informed Consent: I have reviewed the patients History and Physical, chart, labs and discussed the procedure including the risks, benefits and alternatives for the proposed anesthesia with the patient or authorized representative who has indicated his/her understanding and acceptance.     Dental advisory given  Plan Discussed with: CRNA, Anesthesiologist and Surgeon  Anesthesia Plan Comments: (Discussed both nerve block for pain relief post-op and GA;  including NV, sore throat, dental injury, and pulmonary complications)        Anesthesia Quick Evaluation

## 2018-08-09 NOTE — Anesthesia Postprocedure Evaluation (Signed)
Anesthesia Post Note  Patient: Connie Bennett  Procedure(s) Performed: OPEN REDUCTION INTERNAL FIXATION (ORIF) DISTAL RADIAL FRACTURE (Right Wrist)     Patient location during evaluation: PACU Anesthesia Type: General Level of consciousness: awake and alert Pain management: pain level controlled Vital Signs Assessment: post-procedure vital signs reviewed and stable Respiratory status: spontaneous breathing, nonlabored ventilation, respiratory function stable and patient connected to nasal cannula oxygen Cardiovascular status: blood pressure returned to baseline and stable Postop Assessment: no apparent nausea or vomiting Anesthetic complications: no    Last Vitals:  Vitals:   08/09/18 1300 08/09/18 1315  BP: (!) 115/58 (!) 96/47  Pulse: 75 77  Resp: 11 18  Temp:    SpO2: 97% 94%    Last Pain:  Vitals:   08/09/18 1315  TempSrc:   PainSc: 0-No pain                 Norvella Loscalzo

## 2018-08-09 NOTE — Anesthesia Procedure Notes (Signed)
Anesthesia Regional Block: Supraclavicular block   Pre-Anesthetic Checklist: ,, timeout performed, Correct Patient, Correct Site, Correct Laterality, Correct Procedure, Correct Position, site marked, Risks and benefits discussed,  Surgical consent,  Pre-op evaluation,  At surgeon's request and post-op pain management  Laterality: Right  Prep: chloraprep       Needles:  Injection technique: Single-shot  Needle Type: Echogenic Stimulator Needle     Needle Length: 5cm  Needle Gauge: 22     Additional Needles:   Procedures:, nerve stimulator,,, ultrasound used (permanent image in chart),,,,   Nerve Stimulator or Paresthesia:  Response: hand, 0.45 mA,   Additional Responses:   Narrative:  Injection made incrementally with aspirations every 5 mL.  Performed by: Personally  Anesthesiologist: Janeece Riggers, MD  Additional Notes: Functioning IV was confirmed and monitors were applied.  A 31mm 22ga Arrow echogenic stimulator needle was used. Sterile prep and drape,hand hygiene and sterile gloves were used. Ultrasound guidance: relevant anatomy identified, needle position confirmed, local anesthetic spread visualized around nerve(s)., vascular puncture avoided.  Image printed for medical record. Negative aspiration and negative test dose prior to incremental administration of local anesthetic. The patient tolerated the procedure well.

## 2018-08-09 NOTE — Interval H&P Note (Signed)
History and Physical Interval Note:  08/09/2018 9:22 AM  Connie Bennett  has presented today for surgery, with the diagnosis of RIGHT DISTAL RADIUS FRACTURE.  The various methods of treatment have been discussed with the patient and family. After consideration of risks, benefits and other options for treatment, the patient has consented to  Procedure(s): OPEN REDUCTION INTERNAL FIXATION (ORIF) DISTAL RADIAL FRACTURE (Right) as a surgical intervention.  The patient's history has been reviewed, patient examined, no change in status, stable for surgery.  I have reviewed the patient's chart and labs.  Questions were answered to the patient's satisfaction.     [name]

## 2018-08-09 NOTE — Op Note (Signed)
08/09/2018  9:23 AM  PATIENT:  Connie Bennett  68 y.o. female  PRE-OPERATIVE DIAGNOSIS: Displaced right extra-articular distal radius fracture  POST-OPERATIVE DIAGNOSIS:  Same  PROCEDURE: ORIF right extra-articular distal radius fracture  SURGEON: Rayvon Char. Grandville Silos, MD  PHYSICIAN ASSISTANT: Morley Kos, OPA-C  ANESTHESIA:  regional and MAC  SPECIMENS:  None  DRAINS: None  EBL:  less than 50 mL  PREOPERATIVE INDICATIONS:  Connie Bennett is a  68 y.o. female with displaced right extra-articular distal radius fracture that has developed progressive dorsal tilt through the course of early nonoperative treatment, now exceeding 25 degrees with resultant hand-carpus-forearm axis malalignment  The risks benefits and alternatives were discussed with the patient preoperatively including but not limited to the risks of infection, bleeding, nerve injury, cardiopulmonary complications, the need for revision surgery, among others, and the patient verbalized understanding and consented to proceed.  OPERATIVE IMPLANTS: Biomet DVR pegs/screws  OPERATIVE PROCEDURE: After receiving prophylactic antibiotics and a regional block, the patient was escorted to the operative theatre and placed in a supine position.   A surgical "time-out" was performed during which the planned procedure, proposed operative site, and the correct patient identity were compared to the operative consent and agreement confirmed by the circulating nurse according to current facility policy. Following application of a tourniquet to the operative extremity, the exposed skin was pre-scrubbed with Hibiclens scrub brush and then was prepped with Chloraprep and draped in the usual sterile fashion. The limb was exsanguinated with an Esmarch bandage and the tourniquet inflated to approximately 172mHg higher than systolic BP.   A sinusoidal-shaped incision was marked and made over the FCR axis and the distal forearm. The skin was  incised sharply with scalpel, subcutaneous tissues with blunt and spreading dissection. The FCR axis was exploited deeply. The pronator quadratus was reflected in an L-shaped ulnarly and the brachioradialis was split in a Z-plasty fashion for later reapproximation. The fracture was inspected and provisionally reduced.  This was confirmed fluoroscopically. The appropriately sized plate was selected and found to fit well. It was placed in its provisional alignment of the radius and this was confirmed fluoroscopically.  It was secured to the radius with a screw through the slotted hole.  Additional adjustments were made as necessary, and the distal holes were all drilled and filled.  Peg/screw length distally was selected on the shorter side of measurements to minimize the risk for dorsal cortical penetration. The remainder of the proximal holes were drilled and filled.   Final images were obtained and the DRUJ was examined for stability. It was found to be sufficiently stable. The wound was then copiously irrigated and the brachioradialis repaired with 2-0 Vicryl Rapide suture followed by repair of the pronator quadratus with the same suture type. Tourniquet was released and additional hemostasis obtained and the skin was closed with 2-0 Vicryl deep dermal buried sutures followed by running 4-0 Vicryl Rapide horizontal mattress suture in the skin. A bulky dressing with a volar plaster component was applied and the patient was taken to the recovery room in stable condition.  DISPOSITION: The patient will be discharged home today with typical post-op instructions, returning in 10-15 days for reevaluation with new x-rays of the affected wrist out of the splint to include an inclined lateral and then transition to therapy to have a custom splint constructed and begin rehabilitation.

## 2018-08-09 NOTE — Progress Notes (Signed)
Assisted Dr. Oddono with right, ultrasound guided, supraclavicular block. Side rails up, monitors on throughout procedure. See vital signs in flow sheet. Tolerated Procedure well. 

## 2018-08-09 NOTE — Discharge Instructions (Signed)
Corneal Abrasion    The cornea is the clear covering at the front and center of the eye. When looking at the colored portion of the eye (iris), you are looking through the cornea. This very thin tissue is made up of many layers. The surface layer is a single layer of cells (corneal epithelium) and is one of the most sensitive tissues in the body. If a scratch or injury causes the corneal epithelium to come off, it is called a corneal abrasion. If the injury extends to the tissues below the epithelium, the condition is called a corneal ulcer.  CAUSES  Scratches.  Trauma.  Foreign body in the eye. Some people have recurrences of abrasions in the area of the original injury even after it has healed (recurrent erosion syndrome). Recurrent erosion syndrome generally improves and goes away with time.  SYMPTOMS  Eye pain.  Difficulty or inability to keep the injured eye open.  The eye becomes very sensitive to light.  Recurrent erosions tend to happen suddenly, first thing in the morning, usually after waking up and opening the eye. DIAGNOSIS  Your health care provider can diagnose a corneal abrasion during an eye exam. Dye is usually placed in the eye using a drop or a small paper strip moistened by your tears. When the eye is examined with a special light, the abrasion shows up clearly because of the dye.  TREATMENT  Small abrasions may be treated with antibiotic drops or ointment alone.  A pressure patch may be put over the eye. If this is done, follow your doctor's instructions for when to remove the patch. Do not drive or use machines while the eye patch is on. Judging distances is hard to do with a patch on. If the abrasion becomes infected and spreads to the deeper tissues of the cornea, a corneal ulcer can result. This is serious because it can cause corneal scarring. Corneal scars interfere with light passing through the cornea and cause a loss of vision in the involved eye.  HOME CARE  INSTRUCTIONS  Use medicine or ointment as directed. Only take over-the-counter or prescription medicines for pain, discomfort, or fever as directed by your health care provider.  Do not drive or operate machinery if your eye is patched. Your ability to judge distances is impaired.  If your health care provider has given you a follow-up appointment, it is very important to keep that appointment. Not keeping the appointment could result in a severe eye infection or permanent loss of vision. If there is any problem keeping the appointment, let your health care provider know. SEEK MEDICAL CARE IF:  You have pain, light sensitivity, and a scratchy feeling in one eye or both eyes.  Your pressure patch keeps loosening up, and you can blink your eye under the patch after treatment.  Any kind of discharge develops from the eye after treatment or if the lids stick together in the morning.  You have the same symptoms in the morning as you did with the original abrasion days, weeks, or months after the abrasion healed.      Discharge Instructions   You have a dressing with a plaster splint incorporated in it. Move your fingers as much as possible, making a full fist and fully opening the fist. Elevate your hand to reduce pain & swelling of the digits.  Ice over the operative site may be helpful to reduce pain & swelling.  DO NOT USE HEAT. Take the pain medicine that you  currently have on hand as prescribed.  Take ibuprofen 600 mg and Tylenol 325 mg additionally every 6 hours together. Leave the dressing in place until you return to our office.  You may shower, but keep the bandage clean & dry.  You may drive a car when you are off of prescription pain medications and can safely control your vehicle with both hands. Our office will call you to arrange follow-up   Please call [phone] during normal business hours or [phone] after hours for any problems. Including the following:  - excessive  redness of the incisions - drainage for more than 4 days - fever of more than 101.5 F  *Please note that pain medications will not be refilled after hours or on weekends.  Work Status: No lifting, gripping, or grasping greater than pencil and paper tasks until your first post operative visit.        Information for Discharge Teaching: EXPAREL (bupivacaine liposome injectable suspension)   Your surgeon or anesthesiologist gave you EXPAREL(bupivacaine) to help control your pain after surgery.   EXPAREL is a local anesthetic that provides pain relief by numbing the tissue around the surgical site.  EXPAREL is designed to release pain medication over time and can control pain for up to 72 hours.  Depending on how you respond to EXPAREL, you may require less pain medication during your recovery.  Possible side effects:  Temporary loss of sensation or ability to move in the area where bupivacaine was injected.  Nausea, vomiting, constipation  Rarely, numbness and tingling in your mouth or lips, lightheadedness, or anxiety may occur.  Call your doctor right away if you think you may be experiencing any of these sensations, or if you have other questions regarding possible side effects.  Follow all other discharge instructions given to you by your surgeon or nurse. Eat a healthy diet and drink plenty of water or other fluids.  If you return to the hospital for any reason within 96 hours following the administration of EXPAREL, it is important for health care providers to know that you have received this anesthetic. A teal colored band has been placed on your arm with the date, time and amount of EXPAREL you have received in order to alert and inform your health care providers. Please leave this armband in place for the full 96 hours following administration, and then you may remove the band.      Regional Anesthesia Blocks  1. Numbness or the inability to move the "blocked"  extremity may last from 3-48 hours after placement. The length of time depends on the medication injected and your individual response to the medication. If the numbness is not going away after 48 hours, call your surgeon.  2. The extremity that is blocked will need to be protected until the numbness is gone and the  Strength has returned. Because you cannot feel it, you will need to take extra care to avoid injury. Because it may be weak, you may have difficulty moving it or using it. You may not know what position it is in without looking at it while the block is in effect.  3. For blocks in the legs and feet, returning to weight bearing and walking needs to be done carefully. You will need to wait until the numbness is entirely gone and the strength has returned. You should be able to move your leg and foot normally before you try and bear weight or walk. You will need someone to be  with you when you first try to ensure you do not fall and possibly risk injury.  4. Bruising and tenderness at the needle site are common side effects and will resolve in a few days.  5. Persistent numbness or new problems with movement should be communicated to the surgeon or the [facility] [phone] [place] [phone]).        Post Anesthesia Home Care Instructions  Activity: Get plenty of rest for the remainder of the day. A responsible individual must stay with you for 24 hours following the procedure.  For the next 24 hours, DO NOT: -Drive a car -Paediatric nurse -Drink alcoholic beverages -Take any medication unless instructed by your physician -Make any legal decisions or sign important papers.  Meals: Start with liquid foods such as gelatin or soup. Progress to regular foods as tolerated. Avoid greasy, spicy, heavy foods. If nausea and/or vomiting occur, drink only clear liquids until the nausea and/or vomiting subsides. Call your physician if vomiting  continues.  Special Instructions/Symptoms: Your throat may feel dry or sore from the anesthesia or the breathing tube placed in your throat during surgery. If this causes discomfort, gargle with warm salt water. The discomfort should disappear within 24 hours.  If you had a scopolamine patch placed behind your ear for the management of post- operative nausea and/or vomiting:  1. The medication in the patch is effective for 72 hours, after which it should be removed.  Wrap patch in a tissue and discard in the trash. Wash hands thoroughly with soap and water. 2. You may remove the patch earlier than 72 hours if you experience unpleasant side effects which may include dry mouth, dizziness or visual disturbances. 3. Avoid touching the patch. Wash your hands with soap and water after contact with the patch.

## 2018-08-10 ENCOUNTER — Encounter (HOSPITAL_BASED_OUTPATIENT_CLINIC_OR_DEPARTMENT_OTHER): Payer: Self-pay | Admitting: Orthopedic Surgery

## 2018-08-24 DIAGNOSIS — S52501G Unspecified fracture of the lower end of right radius, subsequent encounter for closed fracture with delayed healing: Secondary | ICD-10-CM | POA: Diagnosis not present

## 2018-08-24 DIAGNOSIS — S52551A Other extraarticular fracture of lower end of right radius, initial encounter for closed fracture: Secondary | ICD-10-CM | POA: Diagnosis not present

## 2018-08-30 DIAGNOSIS — S52501G Unspecified fracture of the lower end of right radius, subsequent encounter for closed fracture with delayed healing: Secondary | ICD-10-CM | POA: Diagnosis not present

## 2018-08-31 DIAGNOSIS — G894 Chronic pain syndrome: Secondary | ICD-10-CM | POA: Diagnosis not present

## 2018-08-31 DIAGNOSIS — M47816 Spondylosis without myelopathy or radiculopathy, lumbar region: Secondary | ICD-10-CM | POA: Diagnosis not present

## 2018-08-31 DIAGNOSIS — M961 Postlaminectomy syndrome, not elsewhere classified: Secondary | ICD-10-CM | POA: Diagnosis not present

## 2018-09-14 DIAGNOSIS — S52501G Unspecified fracture of the lower end of right radius, subsequent encounter for closed fracture with delayed healing: Secondary | ICD-10-CM | POA: Diagnosis not present

## 2018-09-21 DIAGNOSIS — S52501G Unspecified fracture of the lower end of right radius, subsequent encounter for closed fracture with delayed healing: Secondary | ICD-10-CM | POA: Diagnosis not present

## 2018-10-08 DIAGNOSIS — S52501G Unspecified fracture of the lower end of right radius, subsequent encounter for closed fracture with delayed healing: Secondary | ICD-10-CM | POA: Diagnosis not present

## 2018-10-19 DIAGNOSIS — S52501G Unspecified fracture of the lower end of right radius, subsequent encounter for closed fracture with delayed healing: Secondary | ICD-10-CM | POA: Diagnosis not present

## 2018-10-25 DIAGNOSIS — H3554 Dystrophies primarily involving the retinal pigment epithelium: Secondary | ICD-10-CM | POA: Diagnosis not present

## 2018-10-26 DIAGNOSIS — G8929 Other chronic pain: Secondary | ICD-10-CM | POA: Diagnosis not present

## 2018-10-26 DIAGNOSIS — G894 Chronic pain syndrome: Secondary | ICD-10-CM | POA: Diagnosis not present

## 2018-10-26 DIAGNOSIS — M961 Postlaminectomy syndrome, not elsewhere classified: Secondary | ICD-10-CM | POA: Diagnosis not present

## 2018-10-26 DIAGNOSIS — M545 Low back pain: Secondary | ICD-10-CM | POA: Diagnosis not present

## 2018-10-29 DIAGNOSIS — S52501G Unspecified fracture of the lower end of right radius, subsequent encounter for closed fracture with delayed healing: Secondary | ICD-10-CM | POA: Diagnosis not present

## 2018-11-02 DIAGNOSIS — S52501G Unspecified fracture of the lower end of right radius, subsequent encounter for closed fracture with delayed healing: Secondary | ICD-10-CM | POA: Diagnosis not present

## 2018-11-05 DIAGNOSIS — M810 Age-related osteoporosis without current pathological fracture: Secondary | ICD-10-CM | POA: Diagnosis not present

## 2018-11-05 DIAGNOSIS — I341 Nonrheumatic mitral (valve) prolapse: Secondary | ICD-10-CM | POA: Diagnosis not present

## 2018-11-05 DIAGNOSIS — Z0000 Encounter for general adult medical examination without abnormal findings: Secondary | ICD-10-CM | POA: Diagnosis not present

## 2018-11-05 DIAGNOSIS — C50919 Malignant neoplasm of unspecified site of unspecified female breast: Secondary | ICD-10-CM | POA: Diagnosis not present

## 2018-11-05 DIAGNOSIS — M797 Fibromyalgia: Secondary | ICD-10-CM | POA: Diagnosis not present

## 2018-11-05 DIAGNOSIS — K219 Gastro-esophageal reflux disease without esophagitis: Secondary | ICD-10-CM | POA: Diagnosis not present

## 2018-11-05 DIAGNOSIS — I129 Hypertensive chronic kidney disease with stage 1 through stage 4 chronic kidney disease, or unspecified chronic kidney disease: Secondary | ICD-10-CM | POA: Diagnosis not present

## 2018-11-05 DIAGNOSIS — N183 Chronic kidney disease, stage 3 (moderate): Secondary | ICD-10-CM | POA: Diagnosis not present

## 2018-11-05 DIAGNOSIS — D649 Anemia, unspecified: Secondary | ICD-10-CM | POA: Diagnosis not present

## 2018-11-05 DIAGNOSIS — G9050 Complex regional pain syndrome I, unspecified: Secondary | ICD-10-CM | POA: Diagnosis not present

## 2018-11-05 DIAGNOSIS — E782 Mixed hyperlipidemia: Secondary | ICD-10-CM | POA: Diagnosis not present

## 2018-11-05 DIAGNOSIS — F322 Major depressive disorder, single episode, severe without psychotic features: Secondary | ICD-10-CM | POA: Diagnosis not present

## 2018-11-16 DIAGNOSIS — E782 Mixed hyperlipidemia: Secondary | ICD-10-CM | POA: Diagnosis not present

## 2018-11-16 DIAGNOSIS — Z23 Encounter for immunization: Secondary | ICD-10-CM | POA: Diagnosis not present

## 2018-11-16 DIAGNOSIS — D649 Anemia, unspecified: Secondary | ICD-10-CM | POA: Diagnosis not present

## 2018-11-16 DIAGNOSIS — I129 Hypertensive chronic kidney disease with stage 1 through stage 4 chronic kidney disease, or unspecified chronic kidney disease: Secondary | ICD-10-CM | POA: Diagnosis not present

## 2018-11-16 DIAGNOSIS — R8290 Unspecified abnormal findings in urine: Secondary | ICD-10-CM | POA: Diagnosis not present

## 2018-11-16 DIAGNOSIS — S52501G Unspecified fracture of the lower end of right radius, subsequent encounter for closed fracture with delayed healing: Secondary | ICD-10-CM | POA: Diagnosis not present

## 2018-11-16 DIAGNOSIS — N183 Chronic kidney disease, stage 3 (moderate): Secondary | ICD-10-CM | POA: Diagnosis not present

## 2018-12-21 DIAGNOSIS — M961 Postlaminectomy syndrome, not elsewhere classified: Secondary | ICD-10-CM | POA: Diagnosis not present

## 2018-12-21 DIAGNOSIS — F1190 Opioid use, unspecified, uncomplicated: Secondary | ICD-10-CM | POA: Diagnosis not present

## 2018-12-21 DIAGNOSIS — M545 Low back pain: Secondary | ICD-10-CM | POA: Diagnosis not present

## 2018-12-21 DIAGNOSIS — G894 Chronic pain syndrome: Secondary | ICD-10-CM | POA: Diagnosis not present

## 2018-12-24 DIAGNOSIS — Z79899 Other long term (current) drug therapy: Secondary | ICD-10-CM | POA: Diagnosis not present

## 2018-12-24 DIAGNOSIS — Z5181 Encounter for therapeutic drug level monitoring: Secondary | ICD-10-CM | POA: Diagnosis not present

## 2018-12-28 NOTE — Progress Notes (Signed)
[place]   [phone]) [phone] [phone]   Clinic Follow up Note   Patient Care Team: Mayra Neer, MD as PCP - General (Family Medicine) Holley Bouche, NP (Inactive) as Nurse Practitioner (Nurse Practitioner) [name], MD as Consulting Physician (General Surgery) Truitt Merle, MD as Consulting Physician (Hematology) Sylvan Cheese, NP as Nurse Practitioner (Hematology and Oncology) Crissie Reese, MD as Consulting Physician (Plastic Surgery)  Date of Service:  12/31/2018  CHIEF COMPLAINT: Follow up bilateral breast cancer  SUMMARY OF ONCOLOGIC HISTORY: Oncology History Overview Note  Breast cancer of upper-outer quadrant of RIGHT female breast [facility])   Staging form: Breast, AJCC 7th Edition Clinical stage from 09/06/2014: Stage 0 (Tis (DCIS), N0, M0)  Pathologic stage from 01/25/2015: Stage 0 (Tis (DCIS), N0, cM0)  ------  Breast cancer of upper-inner quadrant of LEFT female breast ([place])   Staging form: Breast, AJCC 7th Edition Clinical stage from 06/20/2015: Stage IA (T1c, N0, M0)      Pathologic stage from 07/05/2015: Stage IIA (T2, N0, cM0)    Breast cancer of upper-outer quadrant of right female breast ([place])  08/23/2014 Mammogram   Mammogram and ultrasound showed a 3cm cystic lesion at 8:30 of right breast, and a 7 mm and a 5 mm increased density lesion at 10:00 of the right breast.   08/29/2014 Initial Biopsy   Right breast core needle bx x 2: high-grade DCIS, with focus of mildly suspicious for early stromal invasion. ER- (0%), PR- (0%).    08/29/2014 Clinical Stage   Stage 0: Tis N0   09/13/2014 Surgery   Right lumpectomy/SLNB ([place]): High grade DCIS with necrosis and calcfications, positive margins. 5 LN removed and negative for malignancy (0/5). Grade 3.   09/13/2014 Pathologic Stage   Stage 0: Tis N0   09/29/2014 Surgery   Re-excision for positive margins; multiple breast margins re-excised, high-grade DCIS, measuring  1.0 cm, 3.0 cm, 2.0 cm, some margins are still positive or very close.    01/25/2015 Definitive Surgery   Right nipple-sparing mastectomy Barry Dienes): DCIS with calcifications, high grade, spans 9 cm, LCIS, fibrocystic changes with adenosis and calcifications, negative margins. 1 LN removed and negative. Imeediate reconstruction   03/23/2015 Survivorship   Survivorship care plan completed and mailed to patient in lieu of in person visit at request of patient.   Breast cancer of upper-inner quadrant of left female breast ([place])  06/28/2013 Imaging   DEXA scan: Osteopenia [name])   06/19/2015 Mammogram   Diagnostic mammogram and ultrasound showed a 1.3 cm in the upper inner quadrant of left breast, and a 0.9 mm simple cyst. Axilla was negative.   06/20/2015 Receptors her2   ER 100% positive, PR negative, Ki-67 30%, HER-2 negative   06/20/2015 Initial Biopsy   The left breast upper inner quadrant mass biopsy showed invasive ductal carcinoma, grade 2   06/20/2015 Initial Diagnosis   Breast cancer of upper-inner quadrant of left female breast ([place])   07/05/2015 Surgery   Left breast nipple-sparing mastectomy and sentinel lymph node biopsy with immediate reconstruction Barry Dienes); reconstruction with Dr. Harlow Mares at [facility]   07/05/2015 Pathology Results   Left mastectomy: Invasive ductal carcinoma with extracellular mucin, grade 2, spanning 2.0 cm, margins were negative, lobular carcinoma in situ, 3 sentinel lymph nodes negative. PR repeated and remains negative. HER2 remains neg (ratio 1.21)   07/05/2015 Pathologic Stage   pT2, pN0: Stage IIA    07/05/2015 Oncotype testing   RS 28 (intermediate risk), which predicts 10  year risk of distant recurrence 18% with tamoxifen alone   07/30/2015 - 08/30/2015 Anti-estrogen oral therapy   Adjuvant exemestane 25 mg once daily, stopped after one month due to skin rash.    09/01/2015 -  Anti-estrogen oral therapy   Switched to anastrozole 75m daily.  Planned  duration of treatment : 5-10 years (Burr Medico      CURRENT THERAPY:  Anastrozole 1 mg daily, started on 09/28/2015  INTERVAL HISTORY:  Connie PUELLOis here for a follow up and b/l breast cancer. She was last seen by me 9 months ago. She presents to the clinic alone. She notes she is doing well. She is taking anastrozole. She has been having vomiting and diarrhea. She does have IBS and not sure if it is related. She feels her weight has been declined. She also thinks it may be related to one of her many medications. She feels these symptoms sometimes after her breast cancer diagnosis. She notes she eats adequately and normal diet.  She notes she has cyst on her left kidney and was seen [facility] for this. She was getting scans yearly about it. But lost f/u after he left the practice. She notes at night her LE swells, L>R, at night.  She notes Dr. BMarlowe AschoffOffice has not contacted her about being seen again.    REVIEW OF SYSTEMS:   Constitutional: Denies fevers, chills (+) mild weight loss Eyes: Denies blurriness of vision Ears, nose, mouth, throat, and face: Denies mucositis or sore throat Respiratory: Denies cough, dyspnea or wheezes Cardiovascular: Denies palpitation, chest discomfort (+) occasional Lower extremity swelling Gastrointestinal: (+) Occasional N&V (+) Diarrhea  Skin: Denies abnormal skin rashes Lymphatics: Denies new lymphadenopathy or easy bruising Neurological:Denies numbness, tingling or new weaknesses Behavioral/Psych: Mood is stable, no new changes  All other systems were reviewed with the patient and are negative.  MEDICAL HISTORY:  Past Medical History:  Diagnosis Date   Anxiety    takes Xanax daily as needed   Arthritis    Bilateral lower extremity edema    takes Furosemide daily as needed   Breast cancer ([facility] oncologist-  dr YTruitt Merle-  right upper-outer quadrant    dx May 2016--- Stage 0  (Tis,N0,M0)  DCIS  Right breast---  09-13-2014  s/p   radioactive seed/ partial mastectomy / sln bx   Chronic fatigue syndrome    Chronic low back pain    Complication of anesthesia    spinal cord stimulator- to be off for surgery   Depression    takes Effexor daily   Family history of adverse reaction to anesthesia    mom hard to wake up   Fibromyalgia    Headache(784.0)    couple of times a week   Heart murmur    History of bronchitis > 66yrago   History of colon polyps    benign   History of drug overdose    oxycontin  and oxycodone  Sept 2015--  now has narcan injection prescription   History of DVT of lower extremity    2008--  BILATERAL   History of hiatal hernia    HTN (hypertension)    takes Amlodipine and Ramipril daily   Hyperlipidemia    takes Zetia and Crestor  daily   Joint pain    Mitral valve prolapse    MILD /   PER PT ASYMPTOMATIC   Muscle spasm    takes Zanaflex daily as needed   Osteoporosis    Peripheral  neuropathy    takes Neurontin daily   Peripheral vascular disease (HCC)    hx dvt's   RSD (reflex sympathetic dystrophy)    left wrist and forearm from a fx   RSD (reflex sympathetic dystrophy)    Sjogren's disease ([place])    Vertigo    takes Meclizine daily     SURGICAL HISTORY: Past Surgical History:  Procedure Laterality Date   BACK SURGERY     BREAST RECONSTRUCTION WITH PLACEMENT OF TISSUE EXPANDER AND FLEX HD (ACELLULAR HYDRATED DERMIS) Right 01/25/2015   Procedure: RIGHT BREAST RECONSTRUCTION WITH PLACEMENT OF IMPLANT AND ACELLULAR DERMAL MATRIX ;  Surgeon: Crissie Reese, MD;  Location: [place];  Service: Plastics;  Laterality: Right;   BREAST RECONSTRUCTION WITH PLACEMENT OF TISSUE EXPANDER AND FLEX HD (ACELLULAR HYDRATED DERMIS) Left 07/05/2015   Procedure: LEFT BREAST RECONSTRUCTION WITH PLACEMENT OF TISSUE EXPANDER AND  ACELLULAR DERMAL MATRIX ;  Surgeon: Crissie Reese, MD;  Location: [place];  Service: Plastics;  Laterality: Left;   COLONOSCOPY      ESOPHAGOGASTRODUODENOSCOPY     eye lid surgery Bilateral    FOOT SURGERY Bilateral 1996   [facility]'S NEUROMA   INNER EAR SURGERY Right    LUMBAR [place] SURGERY     MASTECTOMY Right 01/25/2015   nipple sparing    NASAL SINUS SURGERY     NIPPLE SPARING MASTECTOMY/SENTINAL LYMPH NODE BIOPSY/RECONSTRUCTION/PLACEMENT OF TISSUE EXPANDER Left 07/05/2015   Procedure: LEFT NIPPLE SPARING MASTECTOMY WITH SENTINAL LYMPH NODE BIOPSY ;  Surgeon: [name], MD;  Location: [place];  Service: General;  Laterality: Left;   OPEN REDUCTION INTERNAL FIXATION (ORIF) DISTAL RADIAL FRACTURE Right 08/09/2018   Procedure: OPEN REDUCTION INTERNAL FIXATION (ORIF) DISTAL RADIAL FRACTURE;  Surgeon: Milly Jakob, MD;  Location: [place];  Service: Orthopedics;  Laterality: Right;   ORIF LEFT WRIST FX'S/  LEFT CARPAL TUNNEL RELEASE  1999   POSTERIOR LUMBAR FUSION  01-30-2009   L4--5 Laminectmy w/ decompression/  bilateral L4--5 microdiskectomy and fusion   RADIOACTIVE SEED GUIDED PARTIAL MASTECTOMY WITH AXILLARY SENTINEL LYMPH NODE BIOPSY Right 09/13/2014   Procedure: RADIOACTIVE SEED GUIDED PARTIAL MASTECTOMY WITH AXILLARY SENTINEL LYMPH NODE BIOPSY;  Surgeon: [name], MD;  Location: [place];  Service: General;  Laterality: Right;   RE-EXCISION OF BREAST LUMPECTOMY Right 09/29/2014   Procedure: RE-EXCISION OF BREAST LUMPECTOMY;  Surgeon: [name], MD;  Location: [place];  Service: General;  Laterality: Right;   RIGHT CARPAL TUNNEL RELEASE/  TRIGGER RELEASE RIGHT MIDDLE FINGER  06-18-2006   RIGHT INFERIOR PARATHYROIDECTOMY  04-07-2006   SIMPLE MASTECTOMY WITH AXILLARY SENTINEL NODE BIOPSY Right 01/25/2015   Procedure: RIGHT NIPPLE [place];  Surgeon: [name], MD;  Location: [place];  Service: General;  Laterality: Right;   SPINAL CORD STIMULATOR IMPLANT  2013   TONSILLECTOMY  as child    I have reviewed the social history and family  history with the patient and they are unchanged from previous note.  ALLERGIES:  is allergic to carbocaine [mepivacaine hcl]; penicillins; sulfa antibiotics; amlodipine; and latex.  MEDICATIONS:  Current Outpatient Medications  Medication Sig Dispense Refill   ALPRAZolam (XANAX) 0.5 MG tablet Take 0.5-1 mg by mouth 2 (two) times daily as needed for anxiety. Take 1 tablet (0.5 mg) by mouth if needed during the day, and 2 tablets (1 mg) if needed at bedtime     anastrozole (ARIMIDEX) 1 MG tablet Take 1 tablet (1 mg total) by mouth daily. 90 tablet 3  Ascorbic Acid (VITAMIN C) 1000 MG tablet Take 1,000 mg by mouth daily.     calcium carbonate (OS-CAL) 600 MG TABS tablet Take 600 mg by mouth daily with breakfast.     Cholecalciferol (VITAMIN D) 50 MCG (2000 UT) tablet Take 2,000 Units by mouth daily.     cyclobenzaprine (FLEXERIL) 10 MG tablet Take 10 mg by mouth 3 (three) times daily as needed for muscle spasms.     ezetimibe (ZETIA) 10 MG tablet Take 10 mg by mouth at bedtime.      Flaxseed, Linseed, (FLAX SEED OIL) 1000 MG CAPS Take by mouth.     furosemide (LASIX) 40 MG tablet Take 40 mg by mouth daily as needed for fluid or edema (leg swelling). Reported on 06/29/2015  1   gabapentin (NEURONTIN) 600 MG tablet Take 600 mg by mouth 3 (three) times daily.       Lactobacillus (PROBIOTIC ACIDOPHILUS PO) Take 1 tablet by mouth every morning.     Omega-3 Fatty Acids (FISH OIL) 1000 MG CAPS Take by mouth.     OVER THE COUNTER MEDICATION Take 1 drop by mouth daily as needed (dry eyes). OTC lubricating eye drops     oxyCODONE-acetaminophen (PERCOCET) 10-325 MG tablet Take 1 tablet by mouth every 4 (four) hours as needed for pain.     PROAIR HFA 108 (90 Base) MCG/ACT inhaler Inhale 2 puffs into the lungs every 4 (four) hours as needed.  2   ramipril (ALTACE) 10 MG capsule Take 10 mg by mouth daily.     rosuvastatin (CRESTOR) 20 MG tablet Take 20 mg by mouth daily.     tiZANidine  (ZANAFLEX) 4 MG tablet Take 4 mg by mouth every 6 (six) hours as needed for muscle spasms.     venlafaxine (EFFEXOR-XR) 150 MG 24 hr capsule Take 150 mg by mouth 2 (two) times daily. Take 1 capsule (150 mg) by mouth daily with breakfast and lunch     No current facility-administered medications for this visit.     PHYSICAL EXAMINATION: ECOG PERFORMANCE STATUS: 1 - Symptomatic but completely ambulatory  Vitals:   12/31/18 1147  BP: (!) 166/62  Pulse: 91  Resp: 18  Temp: 99.1 F (37.3 C)  SpO2: 95%   Filed Weights   12/31/18 1147  Weight: 127 lb 12.8 oz (58 kg)    GENERAL:alert, no distress and comfortable SKIN: skin color, texture, turgor are normal, no rashes or significant lesions EYES: normal, Conjunctiva are pink and non-injected, sclera clear  NECK: supple, thyroid normal size, non-tender, without nodularity LYMPH:  no palpable lymphadenopathy in the cervical, axillary  LUNGS: clear to auscultation and percussion with normal breathing effort HEART: regular rate & rhythm and no murmurs and no lower extremity edema ABDOMEN:abdomen soft, non-tender and normal bowel sounds Musculoskeletal:no cyanosis of digits and no clubbing  NEURO: alert & oriented x 3 with fluent speech, no focal motor/sensory deficits BREAST: S/p b/l mastectomy and reconstruction. No palpable mass, nodules or adenopathy bilaterally. Breast exam benign.   LABORATORY DATA:  I have reviewed the data as listed CBC Latest Ref Rng & Units 12/31/2018 04/30/2018 06/29/2017  WBC 4.0 - 10.5 K/uL 8.4 8.3 8.6  Hemoglobin 12.0 - 15.0 g/dL 10.3(L) 10.1(L) 11.1(L)  Hematocrit 36.0 - 46.0 % 32.3(L) 31.8(L) 34.1(L)  Platelets 150 - 400 K/uL 207 191 194     CMP Latest Ref Rng & Units 12/31/2018 08/05/2018 04/30/2018  Glucose 70 - 99 mg/dL 105(H) 95 81  BUN 8 - 23  mg/dL 13 24(H) 19  Creatinine 0.44 - 1.00 mg/dL 1.17(H) 1.58(H) 1.24(H)  Sodium 135 - 145 mmol/L 143 140 140  Potassium 3.5 - 5.1 mmol/L 4.3 4.5 4.4  Chloride  98 - 111 mmol/L 106 101 103  CO2 22 - 32 mmol/L 29 28 29   Calcium 8.9 - 10.3 mg/dL 10.0 10.2 10.1  Total Protein 6.5 - 8.1 g/dL 6.9 - 7.0  Total Bilirubin 0.3 - 1.2 mg/dL 0.3 - 0.4  Alkaline Phos 38 - 126 U/L 66 - 83  AST 15 - 41 U/L 32 - 35  ALT 0 - 44 U/L 19 - 25      RADIOGRAPHIC STUDIES: I have personally reviewed the radiological images as listed and agreed with the findings in the report. No results found.   ASSESSMENT & PLAN:  AMBERLIN UTKE is a 68 y.o. female with   1. Breast cancer of upper inner quadrant of left breast, Invasive ductal carcinoma, pT2N0M0, stage IIA, G2, ER+/PR-/HER2-, ONCOTYPE RS 28  -She was diagnosed in 05/2015. She is s/p left breast mastectomy.  -Oncotype result and adjuvant chemo was discussed but decided not to pursue  -She started anti-estrogen therapy with exemestane in 07/2015, but stopped after 1 month use due to skin rash. She started anastrozole in 08/2015, tolerating well, plan for 7 years.  -From a breast cancer standpoint she is clinically stable. Labs reviewed, CBC and CMP WNL except Hg 10.3, Cr 1.17. Her physical exam unremarkable. There is no clinical concern for recurrence. -Her mild anemia is likely related to her CKD.  -We'll continue breast cancer surveillance, she had bilateral mastectomy, no need mammogram. -continue Anastrozole  -She has been having recent nausea with some vomiting and occasional diarrhea. Given she has been on Anastrozole with no issues, I do not think this is related. She does have IBS. I encouraged her to continue to have this followed.  -F/u in 6 months    2.Extensive right breast DCIS, >10cm, high grade ER/PR negative -She was diagnosed in 07/2014. She is s/p right lumpectomy and multiple re-excision which resulted in her ultimately having a right breast mastectomy which cured her.   3. Osteopenia Her last DEXA scan in 08/2015 showed osteopenia, stable comparing to 2015 -she previously received  bisphosphonate, per pt  -11/2017 DEXA shows Osteopenia with lowest T-score -2.2 at left femur neck. Stable from 2017.  -I encouraged her to take calcium and Vitamin D.   4. Arthritis, chronic back pain, hypertension,  -She will continue follow-up with her primary care physician  -She follows up with Dr. Brigitte Pulse every6 months  -I advised her to f/u with her PCP. -Stable OA. BP at 166/62 today (12/31/18). -Her Cr at 1.17 today (12/31/18), improved  -She also notes renal cyst. She was previously being followed by [facility], but previously lost f/u. I encouraged her to return to them.   5. Anemia,  -Possibly anemia of chronic disease secondary to CKD -Her iron study, folic acid, and M01 levels were normal in September 2017. -Hg at 10.3 today (12/31/18), slightly worse, I encourage him to take prenatal vitamins  -will repeat iron study next time    6. Cancer Screening -She states she had a colonoscopy in Jan 2019, recommendation was f/u in 5 years.    Plan -Continue anastrozole 1 mg once daily -Lab and f/u 6 months -Will send message to Dr Barry Dienes about any future follow ups    No problem-specific Assessment & Plan notes found for this encounter.  No orders of the defined types were placed in this encounter.  All questions were answered. The patient knows to call the clinic with any problems, questions or concerns. No barriers to learning was detected. I spent 20 minutes counseling the patient face to face. The total time spent in the appointment was 25 minutes and more than 50% was on counseling and review of test results     Truitt Merle, MD 12/31/2018   I, Joslyn Devon, am acting as scribe for Truitt Merle, MD.   I have reviewed the above documentation for accuracy and completeness, and I agree with the above.

## 2018-12-31 ENCOUNTER — Telehealth: Payer: Self-pay | Admitting: Hematology

## 2018-12-31 ENCOUNTER — Encounter: Payer: Self-pay | Admitting: Hematology

## 2018-12-31 ENCOUNTER — Inpatient Hospital Stay: Payer: PPO | Attending: Hematology | Admitting: Hematology

## 2018-12-31 ENCOUNTER — Other Ambulatory Visit: Payer: Self-pay

## 2018-12-31 ENCOUNTER — Inpatient Hospital Stay: Payer: PPO

## 2018-12-31 VITALS — BP 166/62 | HR 91 | Temp 99.1°F | Resp 18 | Ht 63.0 in | Wt 127.8 lb

## 2018-12-31 DIAGNOSIS — N189 Chronic kidney disease, unspecified: Secondary | ICD-10-CM | POA: Insufficient documentation

## 2018-12-31 DIAGNOSIS — Z170 Estrogen receptor positive status [ER+]: Secondary | ICD-10-CM | POA: Diagnosis not present

## 2018-12-31 DIAGNOSIS — K589 Irritable bowel syndrome without diarrhea: Secondary | ICD-10-CM | POA: Insufficient documentation

## 2018-12-31 DIAGNOSIS — Z79899 Other long term (current) drug therapy: Secondary | ICD-10-CM | POA: Diagnosis not present

## 2018-12-31 DIAGNOSIS — D649 Anemia, unspecified: Secondary | ICD-10-CM | POA: Insufficient documentation

## 2018-12-31 DIAGNOSIS — D638 Anemia in other chronic diseases classified elsewhere: Secondary | ICD-10-CM | POA: Diagnosis not present

## 2018-12-31 DIAGNOSIS — C50411 Malignant neoplasm of upper-outer quadrant of right female breast: Secondary | ICD-10-CM | POA: Diagnosis not present

## 2018-12-31 DIAGNOSIS — R5382 Chronic fatigue, unspecified: Secondary | ICD-10-CM | POA: Insufficient documentation

## 2018-12-31 DIAGNOSIS — C50212 Malignant neoplasm of upper-inner quadrant of left female breast: Secondary | ICD-10-CM

## 2018-12-31 DIAGNOSIS — F419 Anxiety disorder, unspecified: Secondary | ICD-10-CM | POA: Diagnosis not present

## 2018-12-31 DIAGNOSIS — R634 Abnormal weight loss: Secondary | ICD-10-CM | POA: Diagnosis not present

## 2018-12-31 DIAGNOSIS — Z86718 Personal history of other venous thrombosis and embolism: Secondary | ICD-10-CM | POA: Diagnosis not present

## 2018-12-31 DIAGNOSIS — E785 Hyperlipidemia, unspecified: Secondary | ICD-10-CM | POA: Diagnosis not present

## 2018-12-31 DIAGNOSIS — R197 Diarrhea, unspecified: Secondary | ICD-10-CM | POA: Diagnosis not present

## 2018-12-31 DIAGNOSIS — R1110 Vomiting, unspecified: Secondary | ICD-10-CM | POA: Insufficient documentation

## 2018-12-31 DIAGNOSIS — G8929 Other chronic pain: Secondary | ICD-10-CM | POA: Insufficient documentation

## 2018-12-31 DIAGNOSIS — I129 Hypertensive chronic kidney disease with stage 1 through stage 4 chronic kidney disease, or unspecified chronic kidney disease: Secondary | ICD-10-CM | POA: Diagnosis not present

## 2018-12-31 DIAGNOSIS — F329 Major depressive disorder, single episode, unspecified: Secondary | ICD-10-CM | POA: Diagnosis not present

## 2018-12-31 DIAGNOSIS — Z79811 Long term (current) use of aromatase inhibitors: Secondary | ICD-10-CM | POA: Insufficient documentation

## 2018-12-31 LAB — COMPREHENSIVE METABOLIC PANEL
ALT: 19 U/L (ref 0–44)
AST: 32 U/L (ref 15–41)
Albumin: 4 g/dL (ref 3.5–5.0)
Alkaline Phosphatase: 66 U/L (ref 38–126)
Anion gap: 8 (ref 5–15)
BUN: 13 mg/dL (ref 8–23)
CO2: 29 mmol/L (ref 22–32)
Calcium: 10 mg/dL (ref 8.9–10.3)
Chloride: 106 mmol/L (ref 98–111)
Creatinine, Ser: 1.17 mg/dL — ABNORMAL HIGH (ref 0.44–1.00)
GFR calc Af Amer: 55 mL/min — ABNORMAL LOW (ref >60)
GFR calc non Af Amer: 48 mL/min — ABNORMAL LOW (ref >60)
Glucose, Bld: 105 mg/dL — ABNORMAL HIGH (ref 70–99)
Potassium: 4.3 mmol/L (ref 3.5–5.1)
Sodium: 143 mmol/L (ref 135–145)
Total Bilirubin: 0.3 mg/dL (ref 0.3–1.2)
Total Protein: 6.9 g/dL (ref 6.5–8.1)

## 2018-12-31 LAB — CBC WITH DIFFERENTIAL/PLATELET
Abs Immature Granulocytes: 0.02 10*3/uL (ref 0.00–0.07)
Basophils Absolute: 0.1 10*3/uL (ref 0.0–0.1)
Basophils Relative: 1 %
Eosinophils Absolute: 0.4 10*3/uL (ref 0.0–0.5)
Eosinophils Relative: 4 %
HCT: 32.3 % — ABNORMAL LOW (ref 36.0–46.0)
Hemoglobin: 10.3 g/dL — ABNORMAL LOW (ref 12.0–15.0)
Immature Granulocytes: 0 %
Lymphocytes Relative: 26 %
Lymphs Abs: 2.2 10*3/uL (ref 0.7–4.0)
MCH: 30.1 pg (ref 26.0–34.0)
MCHC: 31.9 g/dL (ref 30.0–36.0)
MCV: 94.4 fL (ref 80.0–100.0)
Monocytes Absolute: 0.6 10*3/uL (ref 0.1–1.0)
Monocytes Relative: 7 %
Neutro Abs: 5.2 10*3/uL (ref 1.7–7.7)
Neutrophils Relative %: 62 %
Platelets: 207 10*3/uL (ref 150–400)
RBC: 3.42 MIL/uL — ABNORMAL LOW (ref 3.87–5.11)
RDW: 13.7 % (ref 11.5–15.5)
WBC: 8.4 10*3/uL (ref 4.0–10.5)
nRBC: 0 % (ref 0.0–0.2)

## 2018-12-31 NOTE — Telephone Encounter (Signed)
Scheduled per 10/02 los, patient received after vist summary and calender.

## 2019-01-04 ENCOUNTER — Telehealth: Payer: Self-pay

## 2019-01-04 NOTE — Telephone Encounter (Signed)
Left voice message for patient that Dr. Burr Medico had spoken with Dr. Barry Dienes and she is going to see you in f/u in 6 months, Dr. Burr Medico will see you in one year.  Encouraged her to call back if she has questions.  Scheduling message has been sent.

## 2019-01-05 ENCOUNTER — Telehealth: Payer: Self-pay | Admitting: Hematology

## 2019-01-05 NOTE — Telephone Encounter (Signed)
R/s appt per 10/6 sch message - unable to reach pt . Left message with appt date and time

## 2019-01-12 DIAGNOSIS — G8929 Other chronic pain: Secondary | ICD-10-CM | POA: Diagnosis not present

## 2019-01-12 DIAGNOSIS — M961 Postlaminectomy syndrome, not elsewhere classified: Secondary | ICD-10-CM | POA: Diagnosis not present

## 2019-01-12 DIAGNOSIS — M545 Low back pain: Secondary | ICD-10-CM | POA: Diagnosis not present

## 2019-01-12 DIAGNOSIS — G894 Chronic pain syndrome: Secondary | ICD-10-CM | POA: Diagnosis not present

## 2019-01-19 DIAGNOSIS — F3181 Bipolar II disorder: Secondary | ICD-10-CM | POA: Diagnosis not present

## 2019-02-05 ENCOUNTER — Other Ambulatory Visit: Payer: Self-pay | Admitting: Family Medicine

## 2019-02-05 DIAGNOSIS — Z20822 Contact with and (suspected) exposure to covid-19: Secondary | ICD-10-CM

## 2019-02-07 LAB — NOVEL CORONAVIRUS, NAA: SARS-CoV-2, NAA: NOT DETECTED

## 2019-02-11 DIAGNOSIS — M169 Osteoarthritis of hip, unspecified: Secondary | ICD-10-CM | POA: Diagnosis not present

## 2019-02-11 DIAGNOSIS — I10 Essential (primary) hypertension: Secondary | ICD-10-CM | POA: Diagnosis not present

## 2019-02-11 DIAGNOSIS — F419 Anxiety disorder, unspecified: Secondary | ICD-10-CM | POA: Diagnosis not present

## 2019-02-11 DIAGNOSIS — G894 Chronic pain syndrome: Secondary | ICD-10-CM | POA: Diagnosis not present

## 2019-02-11 DIAGNOSIS — G8929 Other chronic pain: Secondary | ICD-10-CM | POA: Diagnosis not present

## 2019-02-11 DIAGNOSIS — Z79811 Long term (current) use of aromatase inhibitors: Secondary | ICD-10-CM | POA: Diagnosis not present

## 2019-02-11 DIAGNOSIS — Z884 Allergy status to anesthetic agent status: Secondary | ICD-10-CM | POA: Diagnosis not present

## 2019-02-11 DIAGNOSIS — M797 Fibromyalgia: Secondary | ICD-10-CM | POA: Diagnosis not present

## 2019-02-11 DIAGNOSIS — Z880 Allergy status to penicillin: Secondary | ICD-10-CM | POA: Diagnosis not present

## 2019-02-11 DIAGNOSIS — I499 Cardiac arrhythmia, unspecified: Secondary | ICD-10-CM | POA: Diagnosis not present

## 2019-02-11 DIAGNOSIS — Z79899 Other long term (current) drug therapy: Secondary | ICD-10-CM | POA: Diagnosis not present

## 2019-02-11 DIAGNOSIS — Z9682 Presence of neurostimulator: Secondary | ICD-10-CM | POA: Diagnosis not present

## 2019-02-11 DIAGNOSIS — Z888 Allergy status to other drugs, medicaments and biological substances status: Secondary | ICD-10-CM | POA: Diagnosis not present

## 2019-02-11 DIAGNOSIS — F17200 Nicotine dependence, unspecified, uncomplicated: Secondary | ICD-10-CM | POA: Diagnosis not present

## 2019-02-11 DIAGNOSIS — E785 Hyperlipidemia, unspecified: Secondary | ICD-10-CM | POA: Diagnosis not present

## 2019-02-11 DIAGNOSIS — I341 Nonrheumatic mitral (valve) prolapse: Secondary | ICD-10-CM | POA: Diagnosis not present

## 2019-02-11 DIAGNOSIS — Z91040 Latex allergy status: Secondary | ICD-10-CM | POA: Diagnosis not present

## 2019-02-11 DIAGNOSIS — M5417 Radiculopathy, lumbosacral region: Secondary | ICD-10-CM | POA: Diagnosis not present

## 2019-02-11 DIAGNOSIS — Z7982 Long term (current) use of aspirin: Secondary | ICD-10-CM | POA: Diagnosis not present

## 2019-02-11 DIAGNOSIS — F329 Major depressive disorder, single episode, unspecified: Secondary | ICD-10-CM | POA: Diagnosis not present

## 2019-02-11 DIAGNOSIS — Z882 Allergy status to sulfonamides status: Secondary | ICD-10-CM | POA: Diagnosis not present

## 2019-02-11 DIAGNOSIS — M5136 Other intervertebral disc degeneration, lumbar region: Secondary | ICD-10-CM | POA: Diagnosis not present

## 2019-03-21 DIAGNOSIS — Z7189 Other specified counseling: Secondary | ICD-10-CM | POA: Diagnosis not present

## 2019-03-21 DIAGNOSIS — J069 Acute upper respiratory infection, unspecified: Secondary | ICD-10-CM | POA: Diagnosis not present

## 2019-04-06 DIAGNOSIS — Z9689 Presence of other specified functional implants: Secondary | ICD-10-CM | POA: Diagnosis not present

## 2019-04-06 DIAGNOSIS — G894 Chronic pain syndrome: Secondary | ICD-10-CM | POA: Diagnosis not present

## 2019-04-06 DIAGNOSIS — M7061 Trochanteric bursitis, right hip: Secondary | ICD-10-CM | POA: Diagnosis not present

## 2019-04-06 DIAGNOSIS — M545 Low back pain: Secondary | ICD-10-CM | POA: Diagnosis not present

## 2019-04-26 DIAGNOSIS — H3554 Dystrophies primarily involving the retinal pigment epithelium: Secondary | ICD-10-CM | POA: Diagnosis not present

## 2019-04-29 ENCOUNTER — Ambulatory Visit: Payer: PPO

## 2019-05-05 ENCOUNTER — Ambulatory Visit: Payer: PPO | Attending: Internal Medicine

## 2019-05-05 DIAGNOSIS — Z23 Encounter for immunization: Secondary | ICD-10-CM | POA: Insufficient documentation

## 2019-05-05 NOTE — Progress Notes (Signed)
   Covid-19 Vaccination Clinic  Name:  Connie Bennett    MRN: [id number] DOB: Jul 30, 1950  05/05/2019  [name] was observed post Covid-19 immunization for 15 minutes without incidence. She was provided with Vaccine Information Sheet and instruction to access the V-Safe system.   [name] was instructed to call 911 with any severe reactions post vaccine: Marland Kitchen Difficulty breathing  . Swelling of your face and throat  . A fast heartbeat  . A bad rash all over your body  . Dizziness and weakness    Immunizations Administered    Name Date Dose VIS Date Route   Pfizer COVID-19 Vaccine 05/05/2019  9:32 AM 0.3 mL 03/11/2019 Intramuscular   Manufacturer: [place]   Lot: [id number]   [place]: [id number]

## 2019-05-10 ENCOUNTER — Ambulatory Visit: Payer: PPO

## 2019-05-13 DIAGNOSIS — N183 Chronic kidney disease, stage 3 unspecified: Secondary | ICD-10-CM | POA: Diagnosis not present

## 2019-05-13 DIAGNOSIS — I129 Hypertensive chronic kidney disease with stage 1 through stage 4 chronic kidney disease, or unspecified chronic kidney disease: Secondary | ICD-10-CM | POA: Diagnosis not present

## 2019-05-13 DIAGNOSIS — E782 Mixed hyperlipidemia: Secondary | ICD-10-CM | POA: Diagnosis not present

## 2019-05-13 DIAGNOSIS — F322 Major depressive disorder, single episode, severe without psychotic features: Secondary | ICD-10-CM | POA: Diagnosis not present

## 2019-05-13 DIAGNOSIS — Z7189 Other specified counseling: Secondary | ICD-10-CM | POA: Diagnosis not present

## 2019-05-13 DIAGNOSIS — C50919 Malignant neoplasm of unspecified site of unspecified female breast: Secondary | ICD-10-CM | POA: Diagnosis not present

## 2019-05-26 DIAGNOSIS — N183 Chronic kidney disease, stage 3 unspecified: Secondary | ICD-10-CM | POA: Diagnosis not present

## 2019-05-26 DIAGNOSIS — E782 Mixed hyperlipidemia: Secondary | ICD-10-CM | POA: Diagnosis not present

## 2019-05-26 DIAGNOSIS — I129 Hypertensive chronic kidney disease with stage 1 through stage 4 chronic kidney disease, or unspecified chronic kidney disease: Secondary | ICD-10-CM | POA: Diagnosis not present

## 2019-05-30 ENCOUNTER — Ambulatory Visit: Payer: PPO | Attending: Internal Medicine

## 2019-05-30 DIAGNOSIS — Z23 Encounter for immunization: Secondary | ICD-10-CM | POA: Insufficient documentation

## 2019-05-30 NOTE — Progress Notes (Signed)
   Covid-19 Vaccination Clinic  Name:  Connie Bennett    MRN: [id number] DOB: Feb 12, 1951  05/30/2019  [name] was observed post Covid-19 immunization for 15 minutes without incidence. She was provided with Vaccine Information Sheet and instruction to access the V-Safe system.   [name] was instructed to call 911 with any severe reactions post vaccine: Marland Kitchen Difficulty breathing  . Swelling of your face and throat  . A fast heartbeat  . A bad rash all over your body  . Dizziness and weakness    Immunizations Administered    Name Date Dose VIS Date Route   Pfizer COVID-19 Vaccine 05/30/2019  1:54 PM 0.3 mL 03/11/2019 Intramuscular   Manufacturer: [place]   Lot: [id number]   [place]: [id number]

## 2019-06-16 ENCOUNTER — Other Ambulatory Visit: Payer: Self-pay | Admitting: Hematology

## 2019-06-16 DIAGNOSIS — C50411 Malignant neoplasm of upper-outer quadrant of right female breast: Secondary | ICD-10-CM

## 2019-06-29 DIAGNOSIS — Z5181 Encounter for therapeutic drug level monitoring: Secondary | ICD-10-CM | POA: Diagnosis not present

## 2019-06-29 DIAGNOSIS — G894 Chronic pain syndrome: Secondary | ICD-10-CM | POA: Diagnosis not present

## 2019-06-29 DIAGNOSIS — Z79899 Other long term (current) drug therapy: Secondary | ICD-10-CM | POA: Diagnosis not present

## 2019-06-29 DIAGNOSIS — M961 Postlaminectomy syndrome, not elsewhere classified: Secondary | ICD-10-CM | POA: Diagnosis not present

## 2019-06-29 DIAGNOSIS — M7061 Trochanteric bursitis, right hip: Secondary | ICD-10-CM | POA: Diagnosis not present

## 2019-07-01 ENCOUNTER — Other Ambulatory Visit: Payer: PPO

## 2019-07-01 ENCOUNTER — Ambulatory Visit: Payer: PPO | Admitting: Hematology

## 2019-09-21 DIAGNOSIS — G8929 Other chronic pain: Secondary | ICD-10-CM | POA: Diagnosis not present

## 2019-09-21 DIAGNOSIS — M545 Low back pain: Secondary | ICD-10-CM | POA: Diagnosis not present

## 2019-09-21 DIAGNOSIS — G894 Chronic pain syndrome: Secondary | ICD-10-CM | POA: Diagnosis not present

## 2019-09-27 DIAGNOSIS — F3181 Bipolar II disorder: Secondary | ICD-10-CM | POA: Diagnosis not present

## 2019-09-30 ENCOUNTER — Telehealth: Payer: Self-pay | Admitting: Hematology

## 2019-09-30 NOTE — Telephone Encounter (Signed)
R/s due to changes in template. Unable to reach pt. Left voicemail with appt time and date. 

## 2019-10-13 NOTE — Progress Notes (Signed)
[facility]   [phone] [phone]   Clinic Follow up Note   Patient Care Team: Lupita Raider, MD as PCP - General (Family Medicine) Hubbard Hartshorn, NP (Inactive) as Nurse Practitioner (Nurse Practitioner) Almond Lint, MD as Consulting Physician (General Surgery) Malachy Mood, MD as Consulting Physician (Hematology) Salomon Fick, NP as Nurse Practitioner (Hematology and Oncology) Etter Sjogren, MD as Consulting Physician (Plastic Surgery)  Date of Service:  10/14/2019  CHIEF COMPLAINT: Follow up bilateral breast cancer  SUMMARY OF ONCOLOGIC HISTORY: Oncology History Overview Note  Breast cancer of upper-outer quadrant of RIGHT female breast [facility])   Staging form: Breast, AJCC 7th Edition Clinical stage from 09/06/2014: Stage 0 (Tis (DCIS), N0, M0)  Pathologic stage from 01/25/2015: Stage 0 (Tis (DCIS), N0, cM0)  ------  Breast cancer of upper-inner quadrant of LEFT female breast (HCC)   Staging form: Breast, AJCC 7th Edition Clinical stage from 06/20/2015: Stage IA (T1c, N0, M0)      Pathologic stage from 07/05/2015: Stage IIA (T2, N0, cM0)    Breast cancer of upper-outer quadrant of right female breast (HCC)  08/23/2014 Mammogram   Mammogram and ultrasound showed a 3cm cystic lesion at 8:30 of right breast, and a 7 mm and a 5 mm increased density lesion at 10:00 of the right breast.   08/29/2014 Initial Biopsy   Right breast core needle bx x 2: high-grade DCIS, with focus of mildly suspicious for early stromal invasion. ER- (0%), PR- (0%).    08/29/2014 Clinical Stage   Stage 0: Tis N0   09/13/2014 Surgery   Right lumpectomy/SLNB ([place]): High grade DCIS with necrosis and calcfications, positive margins. 5 LN removed and negative for malignancy (0/5). Grade 3.   09/13/2014 Pathologic Stage   Stage 0: Tis N0   09/29/2014 Surgery   Re-excision for positive margins; multiple breast margins re-excised, high-grade DCIS, measuring  1.0 cm, 3.0 cm, 2.0 cm, some margins are still positive or very close.    01/25/2015 Definitive Surgery   Right nipple-sparing mastectomy Donell [facility]ers): DCIS with calcifications, high grade, spans 9 cm, LCIS, fibrocystic changes with adenosis and calcifications, negative margins. 1 LN removed and negative. Imeediate reconstruction   03/23/2015 Survivorship   Survivorship care plan completed and mailed to patient in lieu of in person visit at request of patient.   Breast cancer of upper-inner quadrant of left female breast (HCC)  06/28/2013 Imaging   DEXA scan: Osteopenia [facility])   06/19/2015 Mammogram   Diagnostic mammogram and ultrasound showed a 1.3 cm in the upper inner quadrant of left breast, and a 0.9 mm simple cyst. Axilla was negative.   06/20/2015 Receptors her2   ER 100% positive, PR negative, Ki-67 30%, HER-2 negative   06/20/2015 Initial Biopsy   The left breast upper inner quadrant mass biopsy showed invasive ductal carcinoma, grade 2   06/20/2015 Initial Diagnosis   Breast cancer of upper-inner quadrant of left female breast (HCC)   07/05/2015 Surgery   Left breast nipple-sparing mastectomy and sentinel lymph node biopsy with immediate reconstruction [facility]ers); reconstruction with Dr. Odis Luster at [facility]   07/05/2015 Pathology Results   Left mastectomy: Invasive ductal carcinoma with extracellular mucin, grade 2, spanning 2.0 cm, margins were negative, lobular carcinoma in situ, 3 sentinel lymph nodes negative. PR repeated and remains negative. HER2 remains neg (ratio 1.21)   07/05/2015 Pathologic Stage   pT2, pN0: Stage IIA    07/05/2015 Oncotype testing   RS 28 (intermediate risk), which predicts 10  year risk of distant recurrence 18% with tamoxifen alone   07/30/2015 - 08/30/2015 Anti-estrogen oral therapy   Adjuvant exemestane 25 mg once daily, stopped after one month due to skin rash.    09/01/2015 -  Anti-estrogen oral therapy   Switched to anastrozole '1mg'$  daily.  Planned  duration of treatment : 5-10 years [facility])      CURRENT THERAPY:  Anastrozole 1 mg daily, started on 09/28/2015  INTERVAL HISTORY:  Connie Bennett is here for a follow up of b/l breast cancer. She was last seen by me 9 months ago. She presents to the clinic with her family. She notes having shooting pain in right medial axilla which started 6 days ago. This pain is intermittent and lasts a few minutes. It is exacerbated by certain positions. She notes chronic left hip and back pain. This is stable. She notes her appetite is low and fair. She notes she has been having more dental work recently. She is on Anastrozole and tolerating well. She denies hot flashes. She notes she has not been able to f/u with Dr Barry Dienes in a while. She does not have to f/u with her but would like a final visit is possible.    REVIEW OF SYSTEMS:   Constitutional: Denies fevers, chills or abnormal weight loss Eyes: Denies blurriness of vision Ears, nose, mouth, throat, and face: Denies mucositis or sore throat Respiratory: Denies cough, dyspnea or wheezes Cardiovascular: Denies palpitation, chest discomfort or lower extremity swelling Gastrointestinal:  Denies nausea, heartburn or change in bowel habits Skin: Denies abnormal skin rashes MSK: (+) Left hip and back pain, chronic and stable  Lymphatics: Denies new lymphadenopathy or easy bruising Neurological:Denies numbness, tingling or new weaknesses Behavioral/Psych: Mood is stable, no new changes  Breast: (+) Right axillary shooting pain  All other systems were reviewed with the patient and are negative.  MEDICAL HISTORY:  Past Medical History:  Diagnosis Date  . Anxiety    takes Xanax daily as needed  . Arthritis   . Bilateral lower extremity edema    takes Furosemide daily as needed  . Breast cancer [facility]) oncologist-  dr Truitt Merle--  right upper-outer quadrant    dx May 2016--- Stage 0  (Tis,N0,M0)  DCIS  Right breast---  09-13-2014  s/p  radioactive  seed/ partial mastectomy / sln bx  . Chronic fatigue syndrome   . Chronic low back pain   . Complication of anesthesia    spinal cord stimulator- to be off for surgery  . Depression    takes Effexor daily  . Family history of adverse reaction to anesthesia    mom hard to wake up  . Fibromyalgia   . Headache(784.0)    couple of times a week  . Heart murmur   . History of bronchitis > 50yr ago  . History of colon polyps    benign  . History of drug overdose    oxycontin  and oxycodone  Sept 2015--  now has narcan injection prescription  . History of DVT of lower extremity    2008--  BILATERAL  . History of hiatal hernia   . HTN (hypertension)    takes Amlodipine and Ramipril daily  . Hyperlipidemia    takes Zetia and Crestor  daily  . Joint pain   . Mitral valve prolapse    MILD /   PER PT ASYMPTOMATIC  . Muscle spasm    takes Zanaflex daily as needed  . Osteoporosis   . Peripheral  neuropathy    takes Neurontin daily  . Peripheral vascular disease (HCC)    hx dvt's  . RSD (reflex sympathetic dystrophy)    left wrist and forearm from a fx  . RSD (reflex sympathetic dystrophy)   . Sjogren's disease ([place])   . Vertigo    takes Meclizine daily     SURGICAL HISTORY: Past Surgical History:  Procedure Laterality Date  . BACK SURGERY    . BREAST RECONSTRUCTION WITH PLACEMENT OF TISSUE EXPANDER AND FLEX HD (ACELLULAR HYDRATED DERMIS) Right 01/25/2015   Procedure: RIGHT BREAST RECONSTRUCTION WITH PLACEMENT OF IMPLANT AND ACELLULAR DERMAL MATRIX ;  Surgeon: Crissie Reese, MD;  Location: [place];  Service: Plastics;  Laterality: Right;  . BREAST RECONSTRUCTION WITH PLACEMENT OF TISSUE EXPANDER AND FLEX HD (ACELLULAR HYDRATED DERMIS) Left 07/05/2015   Procedure: LEFT BREAST RECONSTRUCTION WITH PLACEMENT OF TISSUE EXPANDER AND  ACELLULAR DERMAL MATRIX ;  Surgeon: Crissie Reese, MD;  Location: [place];  Service: Plastics;  Laterality: Left;  . COLONOSCOPY    . ESOPHAGOGASTRODUODENOSCOPY      . eye lid surgery Bilateral   . FOOT SURGERY Bilateral 1996   MORTON'S NEUROMA  . INNER EAR SURGERY Right   . LUMBAR DISC SURGERY    . MASTECTOMY Right 01/25/2015   nipple sparing   . NASAL SINUS SURGERY    . NIPPLE SPARING MASTECTOMY/SENTINAL LYMPH NODE BIOPSY/RECONSTRUCTION/PLACEMENT OF TISSUE EXPANDER Left 07/05/2015   Procedure: LEFT NIPPLE SPARING MASTECTOMY WITH SENTINAL LYMPH NODE BIOPSY ;  Surgeon: [name], MD;  Location: [place];  Service: General;  Laterality: Left;  . OPEN REDUCTION INTERNAL FIXATION (ORIF) DISTAL RADIAL FRACTURE Right 08/09/2018   Procedure: OPEN REDUCTION INTERNAL FIXATION (ORIF) DISTAL RADIAL FRACTURE;  Surgeon: Milly Jakob, MD;  Location: [place];  Service: Orthopedics;  Laterality: Right;  . ORIF LEFT WRIST FX'S/  LEFT CARPAL TUNNEL RELEASE  1999  . POSTERIOR LUMBAR FUSION  01-30-2009   L4--5 Laminectmy w/ decompression/  bilateral L4--5 microdiskectomy and fusion  . RADIOACTIVE SEED GUIDED PARTIAL MASTECTOMY WITH AXILLARY SENTINEL LYMPH NODE BIOPSY Right 09/13/2014   Procedure: RADIOACTIVE SEED GUIDED PARTIAL MASTECTOMY WITH AXILLARY SENTINEL LYMPH NODE BIOPSY;  Surgeon: [name], MD;  Location: [place];  Service: General;  Laterality: Right;  . RE-EXCISION OF BREAST LUMPECTOMY Right 09/29/2014   Procedure: RE-EXCISION OF BREAST LUMPECTOMY;  Surgeon: [name], MD;  Location: [place];  Service: General;  Laterality: Right;  . RIGHT CARPAL TUNNEL RELEASE/  TRIGGER RELEASE RIGHT MIDDLE FINGER  06-18-2006  . RIGHT INFERIOR PARATHYROIDECTOMY  04-07-2006  . SIMPLE MASTECTOMY WITH AXILLARY SENTINEL NODE BIOPSY Right 01/25/2015   Procedure: RIGHT NIPPLE SPARING MASTECTOMY;  Surgeon: [name], MD;  Location: [place];  Service: General;  Laterality: Right;  . SPINAL CORD STIMULATOR IMPLANT  2013  . TONSILLECTOMY  as child    I have reviewed the social history and family history with the patient and  they are unchanged from previous note.  ALLERGIES:  is allergic to carbocaine [mepivacaine hcl], penicillins, sulfa antibiotics, amlodipine, and latex.  MEDICATIONS:  Current Outpatient Medications  Medication Sig Dispense Refill  . ALPRAZolam (XANAX) 0.5 MG tablet Take 0.5-1 mg by mouth 2 (two) times daily as needed for anxiety. Take 1 tablet (0.5 mg) by mouth if needed during the day, and 2 tablets (1 mg) if needed at bedtime    . anastrozole (ARIMIDEX) 1 MG tablet TAKE 1 TABLET BY MOUTH EVERY DAY 90 tablet 3  .  Ascorbic Acid (VITAMIN C) 1000 MG tablet Take 1,000 mg by mouth daily.    . calcium carbonate (OS-CAL) 600 MG TABS tablet Take 600 mg by mouth daily with breakfast.    . Cholecalciferol (VITAMIN D) 50 MCG (2000 UT) tablet Take 2,000 Units by mouth daily.    . cyclobenzaprine (FLEXERIL) 10 MG tablet Take 10 mg by mouth 3 (three) times daily as needed for muscle spasms.    Marland Kitchen ezetimibe (ZETIA) 10 MG tablet Take 10 mg by mouth at bedtime.     . Flaxseed, Linseed, (FLAX SEED OIL) 1000 MG CAPS Take by mouth.    . furosemide (LASIX) 40 MG tablet Take 40 mg by mouth daily as needed for fluid or edema (leg swelling). Reported on 06/29/2015  1  . gabapentin (NEURONTIN) 600 MG tablet Take 600 mg by mouth 3 (three) times daily.      . Lactobacillus (PROBIOTIC ACIDOPHILUS PO) Take 1 tablet by mouth every morning.    . Omega-3 Fatty Acids (FISH OIL) 1000 MG CAPS Take by mouth.    Marland Kitchen OVER THE COUNTER MEDICATION Take 1 drop by mouth daily as needed (dry eyes). OTC lubricating eye drops    . oxyCODONE-acetaminophen (PERCOCET) 10-325 MG tablet Take 1 tablet by mouth every 4 (four) hours as needed for pain.    Marland Kitchen PROAIR HFA 108 (90 Base) MCG/ACT inhaler Inhale 2 puffs into the lungs every 4 (four) hours as needed.  2  . ramipril (ALTACE) 10 MG capsule Take 10 mg by mouth daily.    . rosuvastatin (CRESTOR) 20 MG tablet Take 20 mg by mouth daily.    Marland Kitchen tiZANidine (ZANAFLEX) 4 MG tablet Take 4 mg by mouth  every 6 (six) hours as needed for muscle spasms.    Marland Kitchen venlafaxine (EFFEXOR-XR) 150 MG 24 hr capsule Take 150 mg by mouth 2 (two) times daily. Take 1 capsule (150 mg) by mouth daily with breakfast and lunch     No current facility-administered medications for this visit.    PHYSICAL EXAMINATION: ECOG PERFORMANCE STATUS: 1 - Symptomatic but completely ambulatory  Vitals:   10/14/19 1314  BP: (!) 156/79  Pulse: 87  Resp: 17  Temp: 98.1 F (36.7 C)  SpO2: 100%   Filed Weights   10/14/19 1314  Weight: 122 lb 8 oz (55.6 kg)    GENERAL:alert, no distress and comfortable SKIN: skin color, texture, turgor are normal, no rashes or significant lesions EYES: normal, Conjunctiva are pink and non-injected, sclera clear  NECK: supple, thyroid normal size, non-tender, without nodularity LYMPH:  no palpable lymphadenopathy in the cervical, axillary  LUNGS: clear to auscultation and percussion with normal breathing effort HEART: regular rate & rhythm and no murmurs and no lower extremity edema ABDOMEN:abdomen soft, non-tender and normal bowel sounds Musculoskeletal:no cyanosis of digits and no clubbing  NEURO: alert & oriented x 3 with fluent speech, no focal motor/sensory deficits BREAST: S/p b/l mastectomy with implant in place: Surgical incisions healed well. (+) Right axillary tenderness. No palpable mass, nodules or adenopathy bilaterally. Breast exam benign.   LABORATORY DATA:  I have reviewed the data as listed CBC Latest Ref Rng & Units 10/14/2019 12/31/2018 04/30/2018  WBC 4.0 - 10.5 K/uL 7.8 8.4 8.3  Hemoglobin 12.0 - 15.0 g/dL 10.7(L) 10.3(L) 10.1(L)  Hematocrit 36 - 46 % 33.6(L) 32.3(L) 31.8(L)  Platelets 150 - 400 K/uL 215 207 191     CMP Latest Ref Rng & Units 10/14/2019 12/31/2018 08/05/2018  Glucose 70 - 99  mg/dL 94 105(H) 95  BUN 8 - 23 mg/dL 14 13 24(H)  Creatinine 0.44 - 1.00 mg/dL 1.36(H) 1.17(H) 1.58(H)  Sodium 135 - 145 mmol/L 143 143 140  Potassium 3.5 - 5.1 mmol/L 3.6  4.3 4.5  Chloride 98 - 111 mmol/L 106 106 101  CO2 22 - 32 mmol/L '27 29 28  '$ Calcium 8.9 - 10.3 mg/dL 10.6(H) 10.0 10.2  Total Protein 6.5 - 8.1 g/dL 7.0 6.9 -  Total Bilirubin 0.3 - 1.2 mg/dL 0.3 0.3 -  Alkaline Phos 38 - 126 U/L 84 66 -  AST 15 - 41 U/L 30 32 -  ALT 0 - 44 U/L 21 19 -      RADIOGRAPHIC STUDIES: I have personally reviewed the radiological images as listed and agreed with the findings in the report. No results found.   ASSESSMENT & PLAN:  Connie Bennett is a 69 y.o. female with    1. Breast cancer of upper inner quadrant of left breast, Invasive ductal carcinoma, pT2N0M0, stage IIA, G2, ER+/PR-/HER2-, ONCOTYPE RS 28 -She was diagnosed in 05/2015. She is s/p left breast mastectomy. -Oncotype result and adjuvant chemo was discussed but decided not to pursue -She started anti-estrogen therapy with exemestane in 07/2015, but stopped after 1 month use due to skin rash. She started anastrozole in 08/2015,tolerating well,plan for7years.  -She is clinically doing well. In the past 6 days she has new onset right axillary shooting pain intermittently. Her breast exam was unremarkable and pain is likely related to surgery even years later. Labs reviewed, CBC And CMP WNL except hg 10.7. There is no clinical concern for recurrence.  -For her Axillary pain, I encouraged her to practice stretching of her right arm and shoulder. She can use Tylenol for pain.  -Continue surveillance. She is s/p b/l mastectomy with implants in place, no need for mammogram.  -Continue anastrozole -F/u in 6 months    2.Extensive right breast DCIS, >10cm, high grade ER/PR negative -She was diagnosed in 07/2014. She is s/p right lumpectomy andmultiplere-excision which resulted in her ultimately having a right breast mastectomy which cured her.   3.Osteopenia -Her last DEXA scan in 08/2015 showed osteopenia, stable comparing to 2015 -She previously receivedbisphosphonate, per pt  -11/2017  DEXA shows Osteopenia with lowest T-score -2.2 at left femur neck. Stable from 2017.  -I encouraged her to take calcium and Vitamin D and do weight bearing exercise.  -Repeat DEXA in 11/2019   4. Arthritis, chronic back pain, hypertension,  -She will continue follow-up with her primary care physician Dr Brigitte Pulse every 6 weeks.  -She was previously being followed by [facility], but previously lost f/u.   5. Anemia -Possibly anemia of chronic disease secondary to CKD -Her iron study, folic acid, and O87 levels were normal in September 2017. -Hg at 10.7 (10/14/19), stable. Iron panel still pending.    6. Cancer Screening -She states she had a colonoscopy in Jan 2019, recommendation was f/u in 5 years.    Plan -Continue anastrozole 1 mg once daily -Lab and f/u with NP Lacie 6 months -will send a message to Dr. Barry Dienes for her f/u in 3 months    No problem-specific Assessment & Plan notes found for this encounter.   No orders of the defined types were placed in this encounter.  All questions were answered. The patient knows to call the clinic with any problems, questions or concerns. No barriers to learning was detected.     Truitt Merle, MD 10/14/2019  Oneal Deputy, am acting as scribe for Truitt Merle, MD.   I have reviewed the above documentation for accuracy and completeness, and I agree with the above.

## 2019-10-14 ENCOUNTER — Encounter: Payer: Self-pay | Admitting: Hematology

## 2019-10-14 ENCOUNTER — Inpatient Hospital Stay: Payer: PPO | Attending: Hematology

## 2019-10-14 ENCOUNTER — Telehealth: Payer: Self-pay | Admitting: Hematology

## 2019-10-14 ENCOUNTER — Other Ambulatory Visit: Payer: Self-pay

## 2019-10-14 ENCOUNTER — Inpatient Hospital Stay: Payer: PPO | Admitting: Hematology

## 2019-10-14 VITALS — BP 156/79 | HR 87 | Temp 98.1°F | Resp 17 | Ht 63.0 in | Wt 122.5 lb

## 2019-10-14 DIAGNOSIS — M79629 Pain in unspecified upper arm: Secondary | ICD-10-CM | POA: Diagnosis not present

## 2019-10-14 DIAGNOSIS — Z888 Allergy status to other drugs, medicaments and biological substances status: Secondary | ICD-10-CM | POA: Diagnosis not present

## 2019-10-14 DIAGNOSIS — C50212 Malignant neoplasm of upper-inner quadrant of left female breast: Secondary | ICD-10-CM

## 2019-10-14 DIAGNOSIS — Z86718 Personal history of other venous thrombosis and embolism: Secondary | ICD-10-CM | POA: Insufficient documentation

## 2019-10-14 DIAGNOSIS — G8929 Other chronic pain: Secondary | ICD-10-CM | POA: Insufficient documentation

## 2019-10-14 DIAGNOSIS — Z9013 Acquired absence of bilateral breasts and nipples: Secondary | ICD-10-CM | POA: Insufficient documentation

## 2019-10-14 DIAGNOSIS — D649 Anemia, unspecified: Secondary | ICD-10-CM | POA: Insufficient documentation

## 2019-10-14 DIAGNOSIS — C50411 Malignant neoplasm of upper-outer quadrant of right female breast: Secondary | ICD-10-CM | POA: Diagnosis not present

## 2019-10-14 DIAGNOSIS — M549 Dorsalgia, unspecified: Secondary | ICD-10-CM | POA: Diagnosis not present

## 2019-10-14 DIAGNOSIS — Z170 Estrogen receptor positive status [ER+]: Secondary | ICD-10-CM | POA: Diagnosis not present

## 2019-10-14 DIAGNOSIS — I10 Essential (primary) hypertension: Secondary | ICD-10-CM | POA: Insufficient documentation

## 2019-10-14 DIAGNOSIS — Z8719 Personal history of other diseases of the digestive system: Secondary | ICD-10-CM | POA: Diagnosis not present

## 2019-10-14 DIAGNOSIS — Z882 Allergy status to sulfonamides status: Secondary | ICD-10-CM | POA: Insufficient documentation

## 2019-10-14 DIAGNOSIS — M85852 Other specified disorders of bone density and structure, left thigh: Secondary | ICD-10-CM | POA: Insufficient documentation

## 2019-10-14 DIAGNOSIS — M1990 Unspecified osteoarthritis, unspecified site: Secondary | ICD-10-CM | POA: Insufficient documentation

## 2019-10-14 DIAGNOSIS — Z79899 Other long term (current) drug therapy: Secondary | ICD-10-CM | POA: Diagnosis not present

## 2019-10-14 DIAGNOSIS — Z880 Allergy status to penicillin: Secondary | ICD-10-CM | POA: Insufficient documentation

## 2019-10-14 DIAGNOSIS — Z79811 Long term (current) use of aromatase inhibitors: Secondary | ICD-10-CM | POA: Diagnosis not present

## 2019-10-14 DIAGNOSIS — D638 Anemia in other chronic diseases classified elsewhere: Secondary | ICD-10-CM

## 2019-10-14 LAB — COMPREHENSIVE METABOLIC PANEL
ALT: 21 U/L (ref 0–44)
AST: 30 U/L (ref 15–41)
Albumin: 3.8 g/dL (ref 3.5–5.0)
Alkaline Phosphatase: 84 U/L (ref 38–126)
Anion gap: 10 (ref 5–15)
BUN: 14 mg/dL (ref 8–23)
CO2: 27 mmol/L (ref 22–32)
Calcium: 10.6 mg/dL — ABNORMAL HIGH (ref 8.9–10.3)
Chloride: 106 mmol/L (ref 98–111)
Creatinine, Ser: 1.36 mg/dL — ABNORMAL HIGH (ref 0.44–1.00)
GFR calc Af Amer: 46 mL/min — ABNORMAL LOW (ref >60)
GFR calc non Af Amer: 39 mL/min — ABNORMAL LOW (ref >60)
Glucose, Bld: 94 mg/dL (ref 70–99)
Potassium: 3.6 mmol/L (ref 3.5–5.1)
Sodium: 143 mmol/L (ref 135–145)
Total Bilirubin: 0.3 mg/dL (ref 0.3–1.2)
Total Protein: 7 g/dL (ref 6.5–8.1)

## 2019-10-14 LAB — CBC WITH DIFFERENTIAL/PLATELET
Abs Immature Granulocytes: 0.01 10*3/uL (ref 0.00–0.07)
Basophils Absolute: 0.1 10*3/uL (ref 0.0–0.1)
Basophils Relative: 1 %
Eosinophils Absolute: 0.4 10*3/uL (ref 0.0–0.5)
Eosinophils Relative: 5 %
HCT: 33.6 % — ABNORMAL LOW (ref 36.0–46.0)
Hemoglobin: 10.7 g/dL — ABNORMAL LOW (ref 12.0–15.0)
Immature Granulocytes: 0 %
Lymphocytes Relative: 32 %
Lymphs Abs: 2.5 10*3/uL (ref 0.7–4.0)
MCH: 30.1 pg (ref 26.0–34.0)
MCHC: 31.8 g/dL (ref 30.0–36.0)
MCV: 94.6 fL (ref 80.0–100.0)
Monocytes Absolute: 0.6 10*3/uL (ref 0.1–1.0)
Monocytes Relative: 8 %
Neutro Abs: 4.1 10*3/uL (ref 1.7–7.7)
Neutrophils Relative %: 54 %
Platelets: 215 10*3/uL (ref 150–400)
RBC: 3.55 MIL/uL — ABNORMAL LOW (ref 3.87–5.11)
RDW: 13.5 % (ref 11.5–15.5)
WBC: 7.8 10*3/uL (ref 4.0–10.5)
nRBC: 0 % (ref 0.0–0.2)

## 2019-10-14 LAB — FERRITIN: Ferritin: 64 ng/mL (ref 11–307)

## 2019-10-14 LAB — IRON AND TIBC
Iron: 34 ug/dL — ABNORMAL LOW (ref 41–142)
Saturation Ratios: 11 % — ABNORMAL LOW (ref 21–57)
TIBC: 306 ug/dL (ref 236–444)
UIBC: 272 ug/dL (ref 120–384)

## 2019-10-14 NOTE — Telephone Encounter (Signed)
Scheduled per los. Gave avs and calendar  

## 2019-10-17 ENCOUNTER — Telehealth: Payer: Self-pay

## 2019-10-17 NOTE — Telephone Encounter (Signed)
-----   Message from Truitt Merle, MD sent at 10/14/2019  8:20 PM EDT ----- Please let pt know her iron level was slightly low, I recommend her to take OTC iron tab or liquid iron, or prenatal MVI daily, thanks   Truitt Merle  10/14/2019

## 2019-10-17 NOTE — Telephone Encounter (Signed)
Called and left message informing Ms. Connie Bennett that her iron was slightly low and that Dr. Burr Medico recommends that she takes an OTC iron tab, liquid iron Prenatal MVI. Informed Ms. Connie Bennett to call back with any questions or concerns.

## 2019-10-18 DIAGNOSIS — H3554 Dystrophies primarily involving the retinal pigment epithelium: Secondary | ICD-10-CM | POA: Diagnosis not present

## 2019-10-18 DIAGNOSIS — H2513 Age-related nuclear cataract, bilateral: Secondary | ICD-10-CM | POA: Diagnosis not present

## 2019-10-27 ENCOUNTER — Telehealth: Payer: Self-pay

## 2019-10-27 NOTE — Telephone Encounter (Signed)
Connie Bennett left a VM stating she wanted a call back about lab results.   I called Connie Bennett and went over lab results with her and notified her that her iron is slightly low and Dr. Burr Medico recommends her to take OTC iron tab, Liquid iron or Prenatal MVI and Connie Bennett verbalized understanding.

## 2019-11-03 ENCOUNTER — Ambulatory Visit: Payer: PPO | Admitting: Hematology

## 2019-11-03 ENCOUNTER — Other Ambulatory Visit: Payer: PPO

## 2019-12-14 DIAGNOSIS — Z5181 Encounter for therapeutic drug level monitoring: Secondary | ICD-10-CM | POA: Diagnosis not present

## 2019-12-14 DIAGNOSIS — Z9689 Presence of other specified functional implants: Secondary | ICD-10-CM | POA: Diagnosis not present

## 2019-12-14 DIAGNOSIS — G894 Chronic pain syndrome: Secondary | ICD-10-CM | POA: Diagnosis not present

## 2019-12-14 DIAGNOSIS — Z79899 Other long term (current) drug therapy: Secondary | ICD-10-CM | POA: Diagnosis not present

## 2019-12-14 DIAGNOSIS — M961 Postlaminectomy syndrome, not elsewhere classified: Secondary | ICD-10-CM | POA: Diagnosis not present

## 2019-12-14 DIAGNOSIS — M5440 Lumbago with sciatica, unspecified side: Secondary | ICD-10-CM | POA: Diagnosis not present

## 2019-12-30 ENCOUNTER — Ambulatory Visit: Payer: PPO | Admitting: Hematology

## 2019-12-30 ENCOUNTER — Other Ambulatory Visit: Payer: PPO

## 2020-02-07 DIAGNOSIS — L900 Lichen sclerosus et atrophicus: Secondary | ICD-10-CM | POA: Diagnosis not present

## 2020-02-07 DIAGNOSIS — N9089 Other specified noninflammatory disorders of vulva and perineum: Secondary | ICD-10-CM | POA: Diagnosis not present

## 2020-02-22 DIAGNOSIS — N9089 Other specified noninflammatory disorders of vulva and perineum: Secondary | ICD-10-CM | POA: Diagnosis not present

## 2020-03-06 DIAGNOSIS — G4700 Insomnia, unspecified: Secondary | ICD-10-CM | POA: Diagnosis not present

## 2020-03-06 DIAGNOSIS — I129 Hypertensive chronic kidney disease with stage 1 through stage 4 chronic kidney disease, or unspecified chronic kidney disease: Secondary | ICD-10-CM | POA: Diagnosis not present

## 2020-03-06 DIAGNOSIS — Z0000 Encounter for general adult medical examination without abnormal findings: Secondary | ICD-10-CM | POA: Diagnosis not present

## 2020-03-06 DIAGNOSIS — C50919 Malignant neoplasm of unspecified site of unspecified female breast: Secondary | ICD-10-CM | POA: Diagnosis not present

## 2020-03-06 DIAGNOSIS — N952 Postmenopausal atrophic vaginitis: Secondary | ICD-10-CM | POA: Diagnosis not present

## 2020-03-06 DIAGNOSIS — E782 Mixed hyperlipidemia: Secondary | ICD-10-CM | POA: Diagnosis not present

## 2020-03-06 DIAGNOSIS — I341 Nonrheumatic mitral (valve) prolapse: Secondary | ICD-10-CM | POA: Diagnosis not present

## 2020-03-06 DIAGNOSIS — F322 Major depressive disorder, single episode, severe without psychotic features: Secondary | ICD-10-CM | POA: Diagnosis not present

## 2020-03-06 DIAGNOSIS — G9050 Complex regional pain syndrome I, unspecified: Secondary | ICD-10-CM | POA: Diagnosis not present

## 2020-03-06 DIAGNOSIS — K219 Gastro-esophageal reflux disease without esophagitis: Secondary | ICD-10-CM | POA: Diagnosis not present

## 2020-03-06 DIAGNOSIS — M797 Fibromyalgia: Secondary | ICD-10-CM | POA: Diagnosis not present

## 2020-03-06 DIAGNOSIS — N183 Chronic kidney disease, stage 3 unspecified: Secondary | ICD-10-CM | POA: Diagnosis not present

## 2020-03-07 DIAGNOSIS — M545 Low back pain, unspecified: Secondary | ICD-10-CM | POA: Diagnosis not present

## 2020-03-07 DIAGNOSIS — G8929 Other chronic pain: Secondary | ICD-10-CM | POA: Diagnosis not present

## 2020-03-07 DIAGNOSIS — Z9689 Presence of other specified functional implants: Secondary | ICD-10-CM | POA: Diagnosis not present

## 2020-03-07 DIAGNOSIS — G894 Chronic pain syndrome: Secondary | ICD-10-CM | POA: Diagnosis not present

## 2020-03-07 DIAGNOSIS — F1190 Opioid use, unspecified, uncomplicated: Secondary | ICD-10-CM | POA: Diagnosis not present

## 2020-03-07 DIAGNOSIS — M533 Sacrococcygeal disorders, not elsewhere classified: Secondary | ICD-10-CM | POA: Diagnosis not present

## 2020-03-29 DIAGNOSIS — F3181 Bipolar II disorder: Secondary | ICD-10-CM | POA: Diagnosis not present

## 2020-04-16 ENCOUNTER — Inpatient Hospital Stay: Payer: PPO

## 2020-04-16 ENCOUNTER — Inpatient Hospital Stay: Payer: PPO | Admitting: Nurse Practitioner

## 2020-04-16 NOTE — Progress Notes (Deleted)
Connie Bennett   Telephone:([phone]) [phone] Fax:(336) [phone]   Clinic Follow up Note   Patient Care Team: Mayra Neer, MD as PCP - General (Family Medicine) Holley Bouche, NP (Inactive) as Nurse Practitioner (Nurse Practitioner) [name], MD as Consulting Physician (General Surgery) Truitt Merle, MD as Consulting Physician (Hematology) Sylvan Cheese, NP as Nurse Practitioner (Hematology and Oncology) Crissie Reese, MD as Consulting Physician (Plastic Surgery) 04/16/2020  CHIEF COMPLAINT: Follow up breast cancer   SUMMARY OF ONCOLOGIC HISTORY: Oncology History Overview Note  Breast cancer of upper-outer quadrant of RIGHT female breast [facility])   Staging form: Breast, AJCC 7th Edition Clinical stage from 09/06/2014: Stage 0 (Tis (DCIS), N0, M0)  Pathologic stage from 01/25/2015: Stage 0 (Tis (DCIS), N0, cM0)  ------  Breast cancer of upper-inner quadrant of LEFT female breast ([place])   Staging form: Breast, AJCC 7th Edition Clinical stage from 06/20/2015: Stage IA (T1c, N0, M0)      Pathologic stage from 07/05/2015: Stage IIA (T2, N0, cM0)    Breast cancer of upper-outer quadrant of right female breast ([place])  08/23/2014 Mammogram   Mammogram and ultrasound showed a 3cm cystic lesion at 8:30 of right breast, and a 7 mm and a 5 mm increased density lesion at 10:00 of the right breast.   08/29/2014 Initial Biopsy   Right breast core needle bx x 2: high-grade DCIS, with focus of mildly suspicious for early stromal invasion. ER- (0%), PR- (0%).    08/29/2014 Clinical Stage   Stage 0: Tis N0   09/13/2014 Surgery   Right lumpectomy/SLNB ([place]): High grade DCIS with necrosis and calcfications, positive margins. 5 LN removed and negative for malignancy (0/5). Grade 3.   09/13/2014 Pathologic Stage   Stage 0: Tis N0   09/29/2014 Surgery   Re-excision for positive margins; multiple breast margins re-excised, high-grade DCIS, measuring 1.0 cm, 3.0 cm, 2.0 cm, some  margins are still positive or very close.    01/25/2015 Definitive Surgery   Right nipple-sparing mastectomy Barry Dienes): DCIS with calcifications, high grade, spans 9 cm, LCIS, fibrocystic changes with adenosis and calcifications, negative margins. 1 LN removed and negative. Imeediate reconstruction   03/23/2015 Survivorship   Survivorship care plan completed and mailed to patient in lieu of in person visit at request of patient.   Breast cancer of upper-inner quadrant of left female breast ([place])  06/28/2013 Imaging   DEXA scan: Osteopenia [facility])   06/19/2015 Mammogram   Diagnostic mammogram and ultrasound showed a 1.3 cm in the upper inner quadrant of left breast, and a 0.9 mm simple cyst. Axilla was negative.   06/20/2015 Receptors her2   ER 100% positive, PR negative, Ki-67 30%, HER-2 negative   06/20/2015 Initial Biopsy   The left breast upper inner quadrant mass biopsy showed invasive ductal carcinoma, grade 2   06/20/2015 Initial Diagnosis   Breast cancer of upper-inner quadrant of left female breast ([place])   07/05/2015 Surgery   Left breast nipple-sparing mastectomy and sentinel lymph node biopsy with immediate reconstruction Barry Dienes); reconstruction with Dr. Harlow Mares at [facility]   07/05/2015 Pathology Results   Left mastectomy: Invasive ductal carcinoma with extracellular mucin, grade 2, spanning 2.0 cm, margins were negative, lobular carcinoma in situ, 3 sentinel lymph nodes negative. PR repeated and remains negative. HER2 remains neg (ratio 1.21)   07/05/2015 Pathologic Stage   pT2, pN0: Stage IIA    07/05/2015 Oncotype testing   RS 28 (intermediate risk), which predicts 10 year risk of distant recurrence  18% with tamoxifen alone   07/30/2015 - 08/30/2015 Anti-estrogen oral therapy   Adjuvant exemestane 25 mg once daily, stopped after one month due to skin rash.    09/01/2015 -  Anti-estrogen oral therapy   Switched to anastrozole 28m daily.  Planned duration of treatment : 5-10  years ([facility]     CURRENT THERAPY: Adjuvant anastrozole 1 mg daily   INTERVAL HISTORY: Ms. GWinninghamreturns for follow up as scheduled. She was last seen 09/2019.    REVIEW OF SYSTEMS:   Constitutional: Denies fevers, chills or abnormal weight loss Eyes: Denies blurriness of vision Ears, nose, mouth, throat, and face: Denies mucositis or sore throat Respiratory: Denies cough, dyspnea or wheezes Cardiovascular: Denies palpitation, chest discomfort or lower extremity swelling Gastrointestinal:  Denies nausea, heartburn or change in bowel habits Skin: Denies abnormal skin rashes Lymphatics: Denies new lymphadenopathy or easy bruising Neurological:Denies numbness, tingling or new weaknesses Behavioral/Psych: Mood is stable, no new changes  All other systems were reviewed with the patient and are negative.  MEDICAL HISTORY:  Past Medical History:  Diagnosis Date  . Anxiety    takes Xanax daily as needed  . Arthritis   . Bilateral lower extremity edema    takes Furosemide daily as needed  . Breast cancer ([facility] oncologist-  dr YTruitt Merle-  right upper-outer quadrant    dx May 2016--- Stage 0  (Tis,N0,M0)  DCIS  Right breast---  09-13-2014  s/p  radioactive seed/ partial mastectomy / sln bx  . Chronic fatigue syndrome   . Chronic low back pain   . Complication of anesthesia    spinal cord stimulator- to be off for surgery  . Depression    takes Effexor daily  . Family history of adverse reaction to anesthesia    mom hard to wake up  . Fibromyalgia   . Headache(784.0)    couple of times a week  . Heart murmur   . History of bronchitis > 66yr[age]  . History of colon polyps    benign  . History of drug overdose    oxycontin  and oxycodone  Sept 2015--  now has narcan injection prescription  . History of DVT of lower extremity    2008--  BILATERAL  . History of hiatal hernia   . HTN (hypertension)    takes Amlodipine and Ramipril daily  . Hyperlipidemia    takes Zetia and  Crestor  daily  . Joint pain   . Mitral valve prolapse    MILD /   PER PT ASYMPTOMATIC  . Muscle spasm    takes Zanaflex daily as needed  . Osteoporosis   . Peripheral neuropathy    takes Neurontin daily  . Peripheral vascular disease (HCC)    hx dvt's  . RSD (reflex sympathetic dystrophy)    left wrist and forearm from a fx  . RSD (reflex sympathetic dystrophy)   . [place]  . Vertigo    takes Meclizine daily     SURGICAL HISTORY: Past Surgical History:  Procedure Laterality Date  . BACK SURGERY    . BREAST RECONSTRUCTION WITH PLACEMENT OF TISSUE EXPANDER AND FLEX HD (ACELLULAR HYDRATED DERMIS) Right 01/25/2015   Procedure: RIGHT BREAST RECONSTRUCTION WITH PLACEMENT OF IMPLANT AND ACELLULAR DERMAL MATRIX ;  Surgeon: DaCrissie ReeseMD;  [facility] Service: [place];  Laterality: Right;  . BREAST RECONSTRUCTION WITH PLACEMENT OF TISSUE EXPANDER AND FLEX HD (ACELLULAR HYDRATED DERMIS) Left 07/05/2015   Procedure: LEFT BREAST  RECONSTRUCTION WITH PLACEMENT OF TISSUE EXPANDER AND  ACELLULAR DERMAL MATRIX ;  Surgeon: Crissie Reese, MD;  Location: [place];  Service: Plastics;  Laterality: Left;  . COLONOSCOPY    . ESOPHAGOGASTRODUODENOSCOPY    . eye lid surgery Bilateral   . FOOT SURGERY Bilateral 1996   MORTON'S NEUROMA  . INNER EAR SURGERY Right   . LUMBAR DISC SURGERY    . MASTECTOMY Right 01/25/2015   nipple sparing   . NASAL SINUS SURGERY    . NIPPLE SPARING MASTECTOMY/SENTINAL LYMPH NODE BIOPSY/RECONSTRUCTION/PLACEMENT OF TISSUE EXPANDER Left 07/05/2015   Procedure: LEFT NIPPLE SPARING MASTECTOMY WITH SENTINAL LYMPH NODE BIOPSY ;  Surgeon: [name], MD;  Location: [place];  Service: General;  Laterality: Left;  . OPEN REDUCTION INTERNAL FIXATION (ORIF) DISTAL RADIAL FRACTURE Right 08/09/2018   Procedure: OPEN REDUCTION INTERNAL FIXATION (ORIF) DISTAL RADIAL FRACTURE;  Surgeon: Milly Jakob, MD;  Location: [place];  Service: Orthopedics;   Laterality: Right;  . ORIF LEFT WRIST FX'S/  LEFT CARPAL TUNNEL RELEASE  1999  . POSTERIOR LUMBAR FUSION  01-30-2009   L4--5 Laminectmy w/ decompression/  bilateral L4--5 microdiskectomy and fusion  . RADIOACTIVE SEED GUIDED PARTIAL MASTECTOMY WITH AXILLARY SENTINEL LYMPH NODE BIOPSY Right 09/13/2014   Procedure: RADIOACTIVE SEED GUIDED PARTIAL MASTECTOMY WITH AXILLARY SENTINEL LYMPH NODE BIOPSY;  Surgeon: [name], MD;  Location: [place];  Service: General;  Laterality: Right;  . RE-EXCISION OF BREAST LUMPECTOMY Right 09/29/2014   Procedure: RE-EXCISION OF BREAST LUMPECTOMY;  Surgeon: [name], MD;  Location: [place];  Service: General;  Laterality: Right;  . RIGHT CARPAL TUNNEL RELEASE/  TRIGGER RELEASE RIGHT MIDDLE FINGER  06-18-2006  . RIGHT INFERIOR PARATHYROIDECTOMY  04-07-2006  . SIMPLE MASTECTOMY WITH AXILLARY SENTINEL NODE BIOPSY Right 01/25/2015   Procedure: RIGHT NIPPLE SPARING MASTECTOMY;  Surgeon: [name], MD;  Location: [place];  Service: General;  Laterality: Right;  . SPINAL CORD STIMULATOR IMPLANT  2013  . TONSILLECTOMY  as child    I have reviewed the social history and family history with the patient and they are unchanged from previous note.  ALLERGIES:  is allergic to carbocaine [mepivacaine hcl], penicillins, sulfa antibiotics, amlodipine, and latex.  MEDICATIONS:  Current Outpatient Medications  Medication Sig Dispense Refill  . ALPRAZolam (XANAX) 0.5 MG tablet Take 0.5-1 mg by mouth 2 (two) times daily as needed for anxiety. Take 1 tablet (0.5 mg) by mouth if needed during the day, and 2 tablets (1 mg) if needed at bedtime    . anastrozole (ARIMIDEX) 1 MG tablet TAKE 1 TABLET BY MOUTH EVERY DAY 90 tablet 3  . Ascorbic Acid (VITAMIN C) 1000 MG tablet Take 1,000 mg by mouth daily.    . calcium carbonate (OS-CAL) 600 MG TABS tablet Take 600 mg by mouth daily with breakfast.    . Cholecalciferol (VITAMIN D) 50 MCG (2000 UT)  tablet Take 2,000 Units by mouth daily.    . cyclobenzaprine (FLEXERIL) 10 MG tablet Take 10 mg by mouth 3 (three) times daily as needed for muscle spasms.    Marland Kitchen ezetimibe (ZETIA) 10 MG tablet Take 10 mg by mouth at bedtime.     . Flaxseed, Linseed, (FLAX SEED OIL) 1000 MG CAPS Take by mouth.    . furosemide (LASIX) 40 MG tablet Take 40 mg by mouth daily as needed for fluid or edema (leg swelling). Reported on 06/29/2015  1  . gabapentin (NEURONTIN) 600 MG tablet Take 600 mg by  mouth 3 (three) times daily.      . Lactobacillus (PROBIOTIC ACIDOPHILUS PO) Take 1 tablet by mouth every morning.    . Omega-3 Fatty Acids (FISH OIL) 1000 MG CAPS Take by mouth.    Marland Kitchen OVER THE COUNTER MEDICATION Take 1 drop by mouth daily as needed (dry eyes). OTC lubricating eye drops    . oxyCODONE-acetaminophen (PERCOCET) 10-325 MG tablet Take 1 tablet by mouth every 4 (four) hours as needed for pain.    Marland Kitchen PROAIR HFA 108 (90 Base) MCG/ACT inhaler Inhale 2 puffs into the lungs every 4 (four) hours as needed.  2  . ramipril (ALTACE) 10 MG capsule Take 10 mg by mouth daily.    . rosuvastatin (CRESTOR) 20 MG tablet Take 20 mg by mouth daily.    Marland Kitchen tiZANidine (ZANAFLEX) 4 MG tablet Take 4 mg by mouth every 6 (six) hours as needed for muscle spasms.    Marland Kitchen venlafaxine (EFFEXOR-XR) 150 MG 24 hr capsule Take 150 mg by mouth 2 (two) times daily. Take 1 capsule (150 mg) by mouth daily with breakfast and lunch     No current facility-administered medications for this visit.    PHYSICAL EXAMINATION: ECOG PERFORMANCE STATUS: {CHL ONC ECOG PS:[id number]}  There were no vitals filed for this visit. There were no vitals filed for this visit.  GENERAL:alert, no distress and comfortable SKIN: skin color, texture, turgor are normal, no rashes or significant lesions EYES: normal, Conjunctiva are pink and non-injected, sclera clear OROPHARYNX:no exudate, no erythema and lips, buccal mucosa, and tongue normal  NECK: supple, thyroid  normal size, non-tender, without nodularity LYMPH:  no palpable lymphadenopathy in the cervical, axillary or inguinal LUNGS: clear to auscultation and percussion with normal breathing effort HEART: regular rate & rhythm and no murmurs and no lower extremity edema ABDOMEN:abdomen soft, non-tender and normal bowel sounds Musculoskeletal:no cyanosis of digits and no clubbing  NEURO: alert & oriented x 3 with fluent speech, no focal motor/sensory deficits  LABORATORY DATA:  I have reviewed the data as listed CBC Latest Ref Rng & Units 10/14/2019 12/31/2018 04/30/2018  WBC 4.0 - 10.5 K/uL 7.8 8.4 8.3  Hemoglobin 12.0 - 15.0 g/dL 10.7(L) 10.3(L) 10.1(L)  Hematocrit 36.0 - 46.0 % 33.6(L) 32.3(L) 31.8(L)  Platelets 150 - 400 K/uL 215 207 191     CMP Latest Ref Rng & Units 10/14/2019 12/31/2018 08/05/2018  Glucose 70 - 99 mg/dL 94 105(H) 95  BUN 8 - 23 mg/dL 14 13 24(H)  Creatinine 0.44 - 1.00 mg/dL 1.36(H) 1.17(H) 1.58(H)  Sodium 135 - 145 mmol/L 143 143 140  Potassium 3.5 - 5.1 mmol/L 3.6 4.3 4.5  Chloride 98 - 111 mmol/L 106 106 101  CO2 22 - 32 mmol/L _0 Calcium 8.9 - 10.3 mg/dL 10.6(H) 10.0 10.2  Total Protein 6.5 - 8.1 g/dL 7.0 6.9 -  Total Bilirubin 0.3 - 1.2 mg/dL 0.3 0.3 -  Alkaline Phos 38 - 126 U/L 84 66 -  AST 15 - 41 U/L 30 32 -  ALT 0 - 44 U/L 21 19 -      RADIOGRAPHIC STUDIES: I have personally reviewed the radiological images as listed and agreed with the findings in the report. No results found.   ASSESSMENT & PLAN:  No problem-specific Assessment & Plan notes found for this encounter.   No orders of the defined types were placed in this encounter.  All questions were answered. The patient knows to call the clinic with any problems, questions  or concerns. No barriers to learning was detected. I spent {CHL ONC TIME VISIT - CBULA:[id number]} counseling the patient face to face. The total time spent in the appointment was {CHL ONC TIME VISIT - ZYYQM:[id number]} and  more than 50% was on counseling and review of test results     Alla Feeling, NP 04/16/20

## 2020-04-17 ENCOUNTER — Telehealth: Payer: Self-pay | Admitting: Nurse Practitioner

## 2020-04-17 NOTE — Telephone Encounter (Signed)
Called pt per 1/17 sch msg - no answer . Left message for patient to call back to reschedule appt.

## 2020-04-23 NOTE — Progress Notes (Signed)
[place]   [phone] [phone]   Clinic Follow up Note   Patient Care Team: Mayra Neer, MD as PCP - General (Family Medicine) Holley Bouche, NP (Inactive) as Nurse Practitioner (Nurse Practitioner) [name], MD as Consulting Physician (General Surgery) Truitt Merle, MD as Consulting Physician (Hematology) Sylvan Cheese, NP as Nurse Practitioner (Hematology and Oncology) Crissie Reese, MD as Consulting Physician (Plastic Surgery)  Date of Service:  04/25/2020  CHIEF COMPLAINT: Follow up bilateral breast cancer  SUMMARY OF ONCOLOGIC HISTORY: Oncology History Overview Note  Breast cancer of upper-outer quadrant of RIGHT female breast [facility])   Staging form: Breast, AJCC 7th Edition Clinical stage from 09/06/2014: Stage 0 (Tis (DCIS), N0, M0)  Pathologic stage from 01/25/2015: Stage 0 (Tis (DCIS), N0, cM0)  ------  Breast cancer of upper-inner quadrant of LEFT female breast ([place])   Staging form: Breast, AJCC 7th Edition Clinical stage from 06/20/2015: Stage IA (T1c, N0, M0)      Pathologic stage from 07/05/2015: Stage IIA (T2, N0, cM0)    Breast cancer of upper-outer quadrant of right female breast ([place])  08/23/2014 Mammogram   Mammogram and ultrasound showed a 3cm cystic lesion at 8:30 of right breast, and a 7 mm and a 5 mm increased density lesion at 10:00 of the right breast.   08/29/2014 Initial Biopsy   Right breast core needle bx x 2: high-grade DCIS, with focus of mildly suspicious for early stromal invasion. ER- (0%), PR- (0%).    08/29/2014 Clinical Stage   Stage 0: Tis N0   09/13/2014 Surgery   Right lumpectomy/SLNB ([place]): High grade DCIS with necrosis and calcfications, positive margins. 5 LN removed and negative for malignancy (0/5). Grade 3.   09/13/2014 Pathologic Stage   Stage 0: Tis N0   09/29/2014 Surgery   Re-excision for positive margins; multiple breast margins re-excised, high-grade DCIS, measuring  1.0 cm, 3.0 cm, 2.0 cm, some margins are still positive or very close.    01/25/2015 Definitive Surgery   Right nipple-sparing mastectomy [facility]): DCIS with calcifications, high grade, spans 9 cm, LCIS, fibrocystic changes with adenosis and calcifications, negative margins. 1 LN removed and negative. Imeediate reconstruction   03/23/2015 Survivorship   Survivorship care plan completed and mailed to patient in lieu of in person visit at request of patient.   Breast cancer of upper-inner quadrant of left female breast ([place])  06/28/2013 Imaging   DEXA scan: Osteopenia [facility])   06/19/2015 Mammogram   Diagnostic mammogram and ultrasound showed a 1.3 cm in the upper inner quadrant of left breast, and a 0.9 mm simple cyst. Axilla was negative.   06/20/2015 Receptors her2   ER 100% positive, PR negative, Ki-67 30%, HER-2 negative   06/20/2015 Initial Biopsy   The left breast upper inner quadrant mass biopsy showed invasive ductal carcinoma, grade 2   06/20/2015 Initial Diagnosis   Breast cancer of upper-inner quadrant of left female breast ([name])   07/05/2015 Surgery   Left breast nipple-sparing mastectomy and sentinel lymph node biopsy with immediate reconstruction [facility]); reconstruction with Dr. Harlow Mares [facility]   07/05/2015 Pathology Results   Left mastectomy: Invasive ductal carcinoma with extracellular mucin, grade 2, spanning 2.0 cm, margins were negative, lobular carcinoma in situ, 3 sentinel lymph nodes negative. PR repeated and remains negative. HER2 remains neg (ratio 1.21)   07/05/2015 Pathologic Stage   pT2, pN0: Stage IIA    07/05/2015 Oncotype testing   RS 28 (intermediate risk), which predicts 10  year risk of distant recurrence 18% with tamoxifen alone   07/30/2015 - 08/30/2015 Anti-estrogen oral therapy   Adjuvant exemestane 25 mg once daily, stopped after one month due to skin rash.    09/01/2015 -  Anti-estrogen oral therapy   Switched to anastrozole 17m daily.  Planned  duration of treatment : 5-10 years ([facility]      CURRENT THERAPY:  Anastrozole 1 mg daily, started on 09/28/2015  INTERVAL HISTORY:  Connie POIRIERis here for a follow up of b/l breast cancer. She was last seen by me 6 months ago. She presents to the clinic alone. She notes she is doing well. She denies any new changes since last visit. She notes her chronic fibromyalgia and arthritis is stable. She continues to tolerate anastrozole well. She no longer her hot flashes. She notes she takes multivitamins and supplements. She has not returned to see Dr BBarry Bennett  She notes she is best friend with one of my patients and helping her.     REVIEW OF SYSTEMS:   Constitutional: Denies fevers, chills or abnormal weight loss Eyes: Denies blurriness of vision Ears, nose, mouth, throat, and face: Denies mucositis or sore throat Respiratory: Denies cough, dyspnea or wheezes Cardiovascular: Denies palpitation, chest discomfort or lower extremity swelling Gastrointestinal:  Denies nausea, heartburn or change in bowel habits Skin: Denies abnormal skin rashes Lymphatics: Denies new lymphadenopathy or easy bruising Neurological:Denies numbness, tingling or new weaknesses Behavioral/Psych: Mood is stable, no new changes  All other systems were reviewed with the patient and are negative.  MEDICAL HISTORY:  Past Medical History:  Diagnosis Date  . Anxiety    takes Xanax daily as needed  . Arthritis   . Bilateral lower extremity edema    takes Furosemide daily as needed  . Breast cancer ([facility] oncologist-  dr YTruitt Merle-  right upper-outer quadrant    dx May 2016--- Stage 0  (Tis,N0,M0)  DCIS  Right breast---  09-13-2014  s/p  radioactive seed/ partial mastectomy / sln bx  . Chronic fatigue syndrome   . Chronic low back pain   . Complication of anesthesia    spinal cord stimulator- to be off for surgery  . Depression    takes Effexor daily  . Family history of adverse reaction to anesthesia    mom  hard to wake up  . Fibromyalgia   . Headache(784.0)    couple of times a week  . Heart murmur   . History of bronchitis > 61yr[age]  . History of colon polyps    benign  . History of drug overdose    oxycontin  and oxycodone  Sept 2015--  now has narcan injection prescription  . History of DVT of lower extremity    2008--  BILATERAL  . History of hiatal hernia   . HTN (hypertension)    takes Amlodipine and Ramipril daily  . Hyperlipidemia    takes Zetia and Crestor  daily  . Joint pain   . Mitral valve prolapse    MILD /   PER PT ASYMPTOMATIC  . Muscle spasm    takes Zanaflex daily as needed  . Osteoporosis   . Peripheral neuropathy    takes Neurontin daily  . Peripheral vascular disease (HCC)    hx dvt's  . RSD (reflex sympathetic dystrophy)    left wrist and forearm from a fx  . RSD (reflex sympathetic dystrophy)   . Sjogren's disease ([facility]  . Vertigo    takes  Meclizine daily     SURGICAL HISTORY: Past Surgical History:  Procedure Laterality Date  . BACK SURGERY    . BREAST RECONSTRUCTION WITH PLACEMENT OF TISSUE EXPANDER AND FLEX HD (ACELLULAR HYDRATED DERMIS) Right 01/25/2015   Procedure: RIGHT BREAST RECONSTRUCTION WITH PLACEMENT OF IMPLANT AND ACELLULAR DERMAL MATRIX ;  Surgeon: Crissie Reese, MD;  Location: [place];  Service: Plastics;  Laterality: Right;  . BREAST RECONSTRUCTION WITH PLACEMENT OF TISSUE EXPANDER AND FLEX HD (ACELLULAR HYDRATED DERMIS) Left 07/05/2015   Procedure: LEFT BREAST RECONSTRUCTION WITH PLACEMENT OF TISSUE EXPANDER AND  ACELLULAR DERMAL MATRIX ;  Surgeon: Crissie Reese, MD;  Location: [place];  Service: Plastics;  Laterality: Left;  . COLONOSCOPY    . ESOPHAGOGASTRODUODENOSCOPY    . eye lid surgery Bilateral   . FOOT SURGERY Bilateral 1996   MORTON'S NEUROMA  . INNER EAR SURGERY Right   . LUMBAR DISC SURGERY    . MASTECTOMY Right 01/25/2015   nipple sparing   . NASAL SINUS SURGERY    . NIPPLE SPARING MASTECTOMY/SENTINAL LYMPH NODE  BIOPSY/RECONSTRUCTION/PLACEMENT OF TISSUE EXPANDER Left 07/05/2015   Procedure: LEFT NIPPLE SPARING MASTECTOMY WITH SENTINAL LYMPH NODE BIOPSY ;  Surgeon: [name], MD;  Location: [place];  Service: General;  Laterality: Left;  . OPEN REDUCTION INTERNAL FIXATION (ORIF) DISTAL RADIAL FRACTURE Right 08/09/2018   Procedure: OPEN REDUCTION INTERNAL FIXATION (ORIF) DISTAL RADIAL FRACTURE;  Surgeon: Milly Jakob, MD;  Location: [place];  Service: Orthopedics;  Laterality: Right;  . ORIF LEFT WRIST FX'S/  LEFT CARPAL TUNNEL RELEASE  1999  . POSTERIOR LUMBAR FUSION  01-30-2009   L4--5 Laminectmy w/ decompression/  bilateral L4--5 microdiskectomy and fusion  . RADIOACTIVE SEED GUIDED PARTIAL MASTECTOMY WITH AXILLARY SENTINEL LYMPH NODE BIOPSY Right 09/13/2014   Procedure: RADIOACTIVE SEED GUIDED PARTIAL MASTECTOMY WITH AXILLARY SENTINEL LYMPH NODE BIOPSY;  Surgeon: [name], MD;  Location: [place];  Service: General;  Laterality: Right;  . RE-EXCISION OF BREAST LUMPECTOMY Right 09/29/2014   Procedure: RE-EXCISION OF BREAST LUMPECTOMY;  Surgeon: [name], MD;  Location: [place];  Service: General;  Laterality: Right;  . RIGHT CARPAL TUNNEL RELEASE/  TRIGGER RELEASE RIGHT MIDDLE FINGER  06-18-2006  . RIGHT INFERIOR PARATHYROIDECTOMY  04-07-2006  . SIMPLE MASTECTOMY WITH AXILLARY SENTINEL NODE BIOPSY Right 01/25/2015   Procedure: RIGHT NIPPLE SPARING MASTECTOMY;  Surgeon: [name], MD;  Location: [place];  Service: General;  Laterality: Right;  . SPINAL CORD STIMULATOR IMPLANT  2013  . TONSILLECTOMY  as child    I have reviewed the social history and family history with the patient and they are unchanged from previous note.  ALLERGIES:  is allergic to carbocaine [mepivacaine hcl], penicillins, sulfa antibiotics, amlodipine, and latex.  MEDICATIONS:  Current Outpatient Medications  Medication Sig Dispense Refill  . ALPRAZolam (XANAX) 0.5 MG  tablet Take 0.5-1 mg by mouth 2 (two) times daily as needed for anxiety. Take 1 tablet (0.5 mg) by mouth if needed during the day, and 2 tablets (1 mg) if needed at bedtime    . anastrozole (ARIMIDEX) 1 MG tablet TAKE 1 TABLET BY MOUTH EVERY DAY 90 tablet 3  . Ascorbic Acid (VITAMIN C) 1000 MG tablet Take 1,000 mg by mouth daily.    . calcium carbonate (OS-CAL) 600 MG TABS tablet Take 600 mg by mouth daily with breakfast.    . Cholecalciferol (VITAMIN D) 50 MCG (2000 UT) tablet Take 2,000 Units by mouth daily.    Marland Kitchen  cyclobenzaprine (FLEXERIL) 10 MG tablet Take 10 mg by mouth 3 (three) times daily as needed for muscle spasms.    Marland Kitchen ezetimibe (ZETIA) 10 MG tablet Take 10 mg by mouth at bedtime.     . Flaxseed, Linseed, (FLAX SEED OIL) 1000 MG CAPS Take by mouth.    . furosemide (LASIX) 40 MG tablet Take 40 mg by mouth daily as needed for fluid or edema (leg swelling). Reported on 06/29/2015  1  . gabapentin (NEURONTIN) 600 MG tablet Take 600 mg by mouth 3 (three) times daily.      . Lactobacillus (PROBIOTIC ACIDOPHILUS PO) Take 1 tablet by mouth every morning.    . Omega-3 Fatty Acids (FISH OIL) 1000 MG CAPS Take by mouth.    Marland Kitchen OVER THE COUNTER MEDICATION Take 1 drop by mouth daily as needed (dry eyes). OTC lubricating eye drops    . oxyCODONE-acetaminophen (PERCOCET) 10-325 MG tablet Take 1 tablet by mouth every 4 (four) hours as needed for pain.    Marland Kitchen PROAIR HFA 108 (90 Base) MCG/ACT inhaler Inhale 2 puffs into the lungs every 4 (four) hours as needed.  2  . ramipril (ALTACE) 10 MG capsule Take 10 mg by mouth daily.    . rosuvastatin (CRESTOR) 20 MG tablet Take 20 mg by mouth daily.    Marland Kitchen tiZANidine (ZANAFLEX) 4 MG tablet Take 4 mg by mouth every 6 (six) hours as needed for muscle spasms.    Marland Kitchen venlafaxine (EFFEXOR-XR) 150 MG 24 hr capsule Take 150 mg by mouth 2 (two) times daily. Take 1 capsule (150 mg) by mouth daily with breakfast and lunch     No current facility-administered medications for this  visit.    PHYSICAL EXAMINATION: ECOG PERFORMANCE STATUS: 0 - Asymptomatic  Vitals:   04/25/20 1114  BP: 130/68  Pulse: 92  Resp: 16  Temp: 98.1 F (36.7 C)  SpO2: 98%   Filed Weights   04/25/20 1114  Weight: 130 lb 6.4 oz (59.1 kg)    GENERAL:alert, no distress and comfortable SKIN: skin color, texture, turgor are normal, no rashes or significant lesions EYES: normal, Conjunctiva are pink and non-injected, sclera clear  NECK: supple, thyroid normal size, non-tender, without nodularity LYMPH:  no palpable lymphadenopathy in the cervical, axillary  LUNGS: clear to auscultation and percussion with normal breathing effort HEART: regular rate & rhythm and no murmurs and no lower extremity edema ABDOMEN:abdomen soft, non-tender and normal bowel sounds Musculoskeletal:no cyanosis of digits and no clubbing  NEURO: alert & oriented x 3 with fluent speech, no focal motor/sensory deficits BREAST: S/p b/l mastectomy with implants in place: surgical incision healed well. No palpable mass, nodules or adenopathy bilaterally. Breast exam benign.   LABORATORY DATA:  I have reviewed the data as listed CBC Latest Ref Rng & Units 04/25/2020 10/14/2019 12/31/2018  WBC 4.0 - 10.5 K/uL 8.9 7.8 8.4  Hemoglobin 12.0 - 15.0 g/dL 10.5(L) 10.7(L) 10.3(L)  Hematocrit 36.0 - 46.0 % 33.0(L) 33.6(L) 32.3(L)  Platelets 150 - 400 K/uL 196 215 207     CMP Latest Ref Rng & Units 04/25/2020 10/14/2019 12/31/2018  Glucose 70 - 99 mg/dL 89 94 105(H)  BUN 8 - 23 mg/dL 16 14 13   Creatinine 0.44 - 1.00 mg/dL 1.33(H) 1.36(H) 1.17(H)  Sodium 135 - 145 mmol/L 143 143 143  Potassium 3.5 - 5.1 mmol/L 3.8 3.6 4.3  Chloride 98 - 111 mmol/L 107 106 106  CO2 22 - 32 mmol/L 28 27 29   Calcium 8.9 -  10.3 mg/dL 9.7 10.6(H) 10.0  Total Protein 6.5 - 8.1 g/dL 6.8 7.0 6.9  Total Bilirubin 0.3 - 1.2 mg/dL <0.2(L) 0.3 0.3  Alkaline Phos 38 - 126 U/L 74 84 66  AST 15 - 41 U/L 25 30 32  ALT 0 - 44 U/L 23 21 19        RADIOGRAPHIC STUDIES: I have personally reviewed the radiological images as listed and agreed with the findings in the report. No results found.   ASSESSMENT & PLAN:  Connie Bennett is a 70 y.o. female with   1. Breast cancer of upper inner quadrant of left breast, Invasive ductal carcinoma, pT2N0M0, stage IIA, G2, ER+/PR-/HER2-, ONCOTYPE RS 28 -She was diagnosed in 05/2015. She is s/p left breast mastectomy. -Oncotype result and adjuvant chemo was discussed but decided not to pursue -She started anti-estrogen therapy with exemestane in 07/2015, but stopped after 1 month use due to skin rash. She started anastrozole in 08/2015,tolerating well,plan for7years. -She is clinically doing well. Lab reviewed, her CBC and CMP are within normal limits except Hg 10.5. Her physical exam was unremarkable. There is no clinical concern for recurrence. -Continue surveillance. Given she is s/p b/l mastectomy, she does not require mammograms.  -Continue Anastrozole  -F/u in 6 months   2.Extensive right breast DCIS, >10cm, high grade ER/PR negative -She was diagnosed in 07/2014. She is s/p right lumpectomy andmultiplere-excision which resulted in her ultimately having a right breast mastectomy which cured her.   3.Osteopenia -Her 08/2015 DEXA showed osteopenia, stable comparing to 2015. 11/2017 DEXA shows Osteopenia with lowest T-score -2.2 at left femur neck. Stable from 2017. -She previously receivedbisphosphonate, per pt -I encouraged her to take calcium and Vitamin D and do weight bearing exercise.  -She plans to repeat DEXA in 2022.   4. Arthritis, chronic back pain, hypertension,  -She will continue follow-up with her primary care physician Dr Brigitte Pulse every 6 weeks.  -She was[facility]being [facility] by [facility], butpreviouslylost f/u.  -Stable   5. Anemia -Possibly anemia of chronic disease secondary to CKD -Her iron study, folic acid, and O29 levels were  normal in September 2017. -Mild and stable. Iron panel still pending (04/25/20).  -Continue oral iron daily.    6. Cancer Screening -She states she had a colonoscopy in Jan 2019, recommendation was f/u in 5 years.   Plan -Continue anastrozole 1 mg once daily -Lab and f/u in 6 months    No problem-specific Assessment & Plan notes found for this encounter.   No orders of the defined types were placed in this encounter.  All questions were answered. The patient knows to call the clinic with any problems, questions or concerns. No barriers to learning was detected. The total time spent in the appointment was 30 minutes.     Truitt Merle, MD 04/25/2020   I, Joslyn Devon, am acting as scribe for Truitt Merle, MD.   I have reviewed the above documentation for accuracy and completeness, and I agree with the above.

## 2020-04-25 ENCOUNTER — Inpatient Hospital Stay: Payer: PPO | Admitting: Hematology

## 2020-04-25 ENCOUNTER — Encounter: Payer: Self-pay | Admitting: Hematology

## 2020-04-25 ENCOUNTER — Inpatient Hospital Stay: Payer: PPO | Attending: Hematology

## 2020-04-25 ENCOUNTER — Other Ambulatory Visit: Payer: Self-pay

## 2020-04-25 VITALS — BP 130/68 | HR 92 | Temp 98.1°F | Resp 16 | Wt 130.4 lb

## 2020-04-25 DIAGNOSIS — M85852 Other specified disorders of bone density and structure, left thigh: Secondary | ICD-10-CM | POA: Diagnosis not present

## 2020-04-25 DIAGNOSIS — I10 Essential (primary) hypertension: Secondary | ICD-10-CM | POA: Diagnosis not present

## 2020-04-25 DIAGNOSIS — M1990 Unspecified osteoarthritis, unspecified site: Secondary | ICD-10-CM | POA: Insufficient documentation

## 2020-04-25 DIAGNOSIS — Z79899 Other long term (current) drug therapy: Secondary | ICD-10-CM | POA: Diagnosis not present

## 2020-04-25 DIAGNOSIS — Z888 Allergy status to other drugs, medicaments and biological substances status: Secondary | ICD-10-CM | POA: Diagnosis not present

## 2020-04-25 DIAGNOSIS — M797 Fibromyalgia: Secondary | ICD-10-CM | POA: Diagnosis not present

## 2020-04-25 DIAGNOSIS — Z880 Allergy status to penicillin: Secondary | ICD-10-CM | POA: Diagnosis not present

## 2020-04-25 DIAGNOSIS — D649 Anemia, unspecified: Secondary | ICD-10-CM | POA: Diagnosis not present

## 2020-04-25 DIAGNOSIS — Z8719 Personal history of other diseases of the digestive system: Secondary | ICD-10-CM | POA: Insufficient documentation

## 2020-04-25 DIAGNOSIS — G8929 Other chronic pain: Secondary | ICD-10-CM | POA: Diagnosis not present

## 2020-04-25 DIAGNOSIS — Z170 Estrogen receptor positive status [ER+]: Secondary | ICD-10-CM | POA: Diagnosis not present

## 2020-04-25 DIAGNOSIS — C50411 Malignant neoplasm of upper-outer quadrant of right female breast: Secondary | ICD-10-CM | POA: Diagnosis not present

## 2020-04-25 DIAGNOSIS — M549 Dorsalgia, unspecified: Secondary | ICD-10-CM | POA: Insufficient documentation

## 2020-04-25 DIAGNOSIS — Z86718 Personal history of other venous thrombosis and embolism: Secondary | ICD-10-CM | POA: Diagnosis not present

## 2020-04-25 DIAGNOSIS — D638 Anemia in other chronic diseases classified elsewhere: Secondary | ICD-10-CM

## 2020-04-25 DIAGNOSIS — C50212 Malignant neoplasm of upper-inner quadrant of left female breast: Secondary | ICD-10-CM | POA: Insufficient documentation

## 2020-04-25 DIAGNOSIS — Z882 Allergy status to sulfonamides status: Secondary | ICD-10-CM | POA: Insufficient documentation

## 2020-04-25 DIAGNOSIS — Z79811 Long term (current) use of aromatase inhibitors: Secondary | ICD-10-CM | POA: Diagnosis not present

## 2020-04-25 DIAGNOSIS — Z9013 Acquired absence of bilateral breasts and nipples: Secondary | ICD-10-CM | POA: Insufficient documentation

## 2020-04-25 LAB — COMPREHENSIVE METABOLIC PANEL
ALT: 23 U/L (ref 0–44)
AST: 25 U/L (ref 15–41)
Albumin: 3.8 g/dL (ref 3.5–5.0)
Alkaline Phosphatase: 74 U/L (ref 38–126)
Anion gap: 8 (ref 5–15)
BUN: 16 mg/dL (ref 8–23)
CO2: 28 mmol/L (ref 22–32)
Calcium: 9.7 mg/dL (ref 8.9–10.3)
Chloride: 107 mmol/L (ref 98–111)
Creatinine, Ser: 1.33 mg/dL — ABNORMAL HIGH (ref 0.44–1.00)
GFR, Estimated: 43 mL/min — ABNORMAL LOW (ref >60)
Glucose, Bld: 89 mg/dL (ref 70–99)
Potassium: 3.8 mmol/L (ref 3.5–5.1)
Sodium: 143 mmol/L (ref 135–145)
Total Bilirubin: <0.2 mg/dL — ABNORMAL LOW (ref 0.3–1.2)
Total Protein: 6.8 g/dL (ref 6.5–8.1)

## 2020-04-25 LAB — CBC WITH DIFFERENTIAL/PLATELET
Abs Immature Granulocytes: 0.03 10*3/uL (ref 0.00–0.07)
Basophils Absolute: 0.1 10*3/uL (ref 0.0–0.1)
Basophils Relative: 1 %
Eosinophils Absolute: 0.4 10*3/uL (ref 0.0–0.5)
Eosinophils Relative: 4 %
HCT: 33 % — ABNORMAL LOW (ref 36.0–46.0)
Hemoglobin: 10.5 g/dL — ABNORMAL LOW (ref 12.0–15.0)
Immature Granulocytes: 0 %
Lymphocytes Relative: 31 %
Lymphs Abs: 2.7 10*3/uL (ref 0.7–4.0)
MCH: 30.4 pg (ref 26.0–34.0)
MCHC: 31.8 g/dL (ref 30.0–36.0)
MCV: 95.7 fL (ref 80.0–100.0)
Monocytes Absolute: 0.8 10*3/uL (ref 0.1–1.0)
Monocytes Relative: 9 %
Neutro Abs: 4.9 10*3/uL (ref 1.7–7.7)
Neutrophils Relative %: 55 %
Platelets: 196 10*3/uL (ref 150–400)
RBC: 3.45 MIL/uL — ABNORMAL LOW (ref 3.87–5.11)
RDW: 13.3 % (ref 11.5–15.5)
WBC: 8.9 10*3/uL (ref 4.0–10.5)
nRBC: 0 % (ref 0.0–0.2)

## 2020-04-25 LAB — FERRITIN: Ferritin: 67 ng/mL (ref 11–307)

## 2020-04-25 LAB — IRON AND TIBC
Iron: 49 ug/dL (ref 41–142)
Saturation Ratios: 16 % — ABNORMAL LOW (ref 21–57)
TIBC: 300 ug/dL (ref 236–444)
UIBC: 251 ug/dL (ref 120–384)

## 2020-04-25 IMAGING — RF DG C-ARM 61-120 MIN
1 series · 3 of 3 positions shown · non-contrast
Comparison: CT scan of July 16, 2018.

CLINICAL DATA: Surgical internal fixation of right wrist fracture.

EXAM:
RIGHT WRIST - COMPLETE 3+ VIEW; DG C-ARM 61-120 MIN
Radiation exposure index: 0.91 mGy.

[Series 1: run · 3 of 3 slices shown]
[im 1/3]
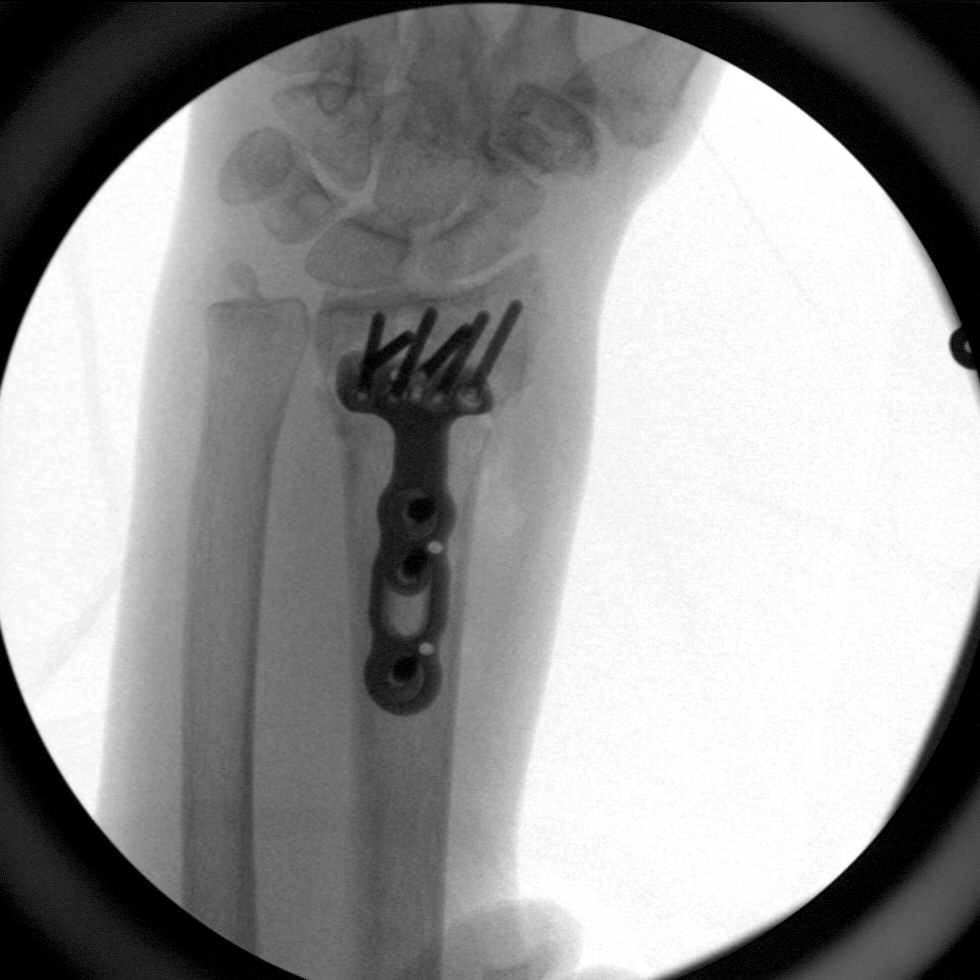
[im 2/3]
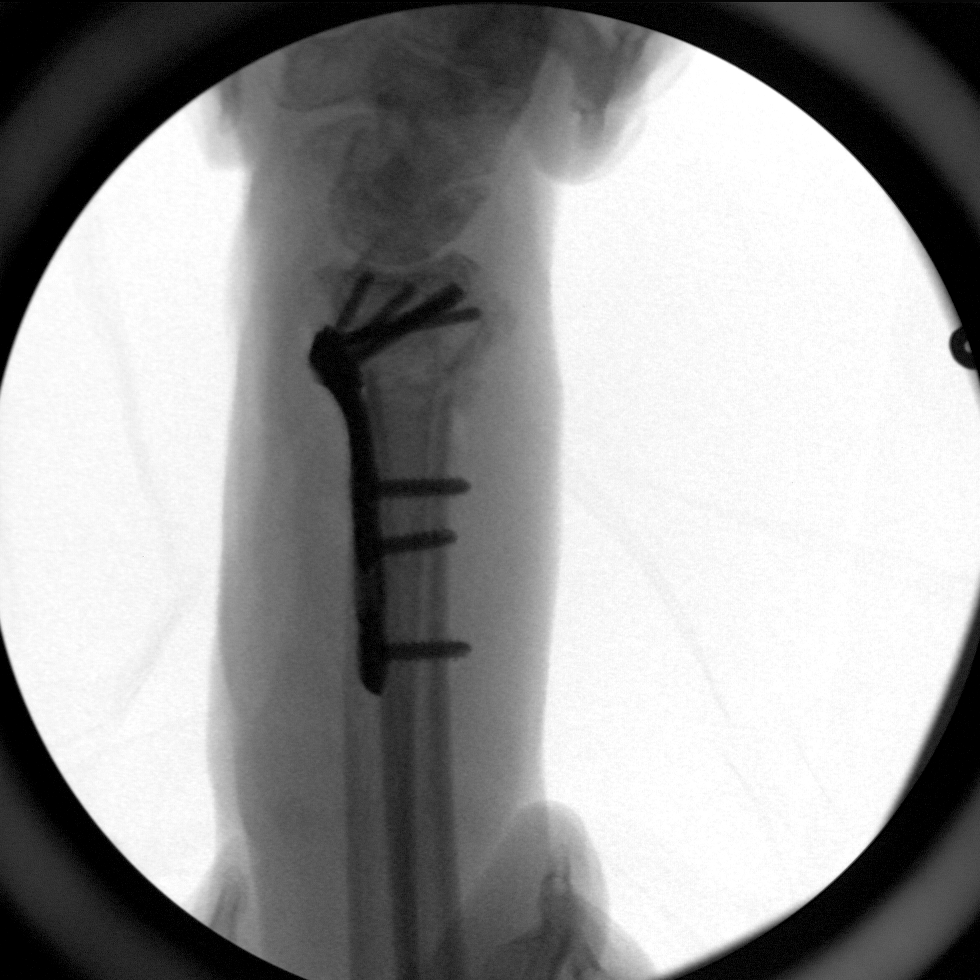
[im 3/3]
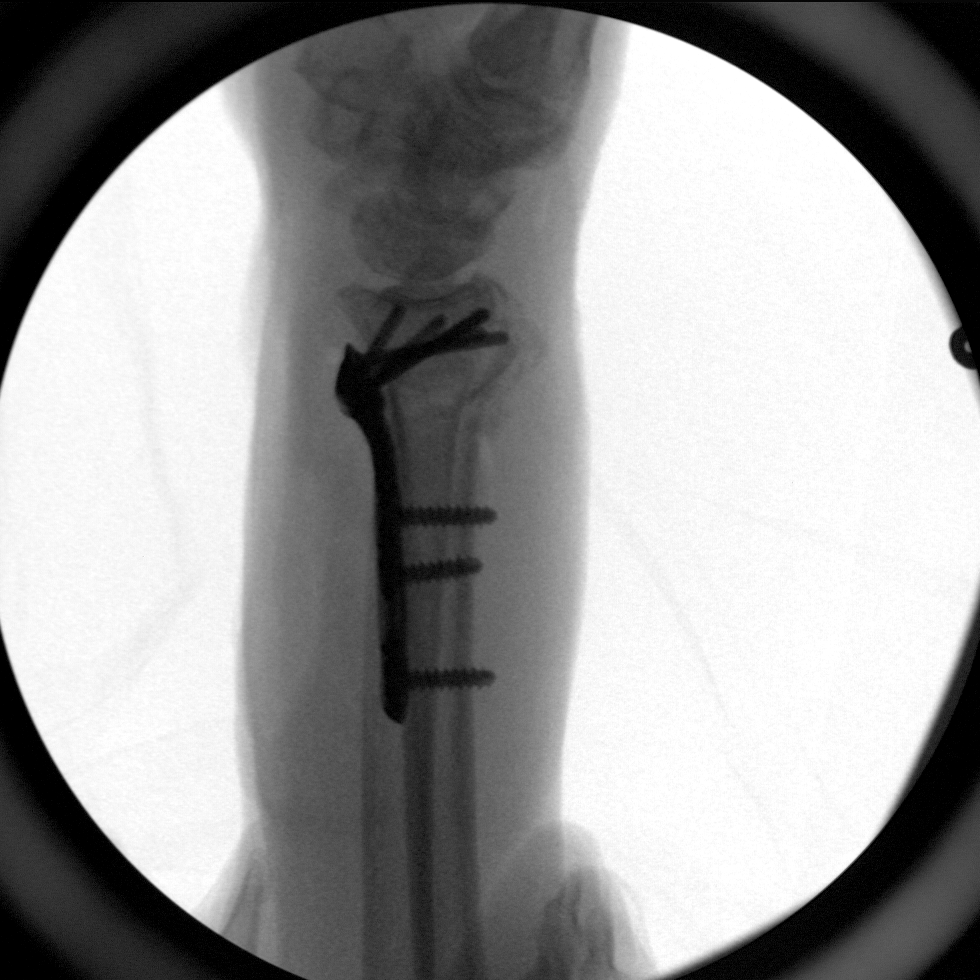

[3 of 3 positions shown; findings below may reference images not displayed]

FINDINGS: Three intraoperative fluoroscopic images of the right wrist
demonstrate the patient be status post surgical internal fixation
distal right radial fracture. Good alignment of fracture components
is noted. Mildly displaced ulnar styloid fracture is noted.
IMPRESSION: Fluoroscopic guidance provided during internal fixation of distal
right radial fracture.

## 2020-05-16 DIAGNOSIS — Z780 Asymptomatic menopausal state: Secondary | ICD-10-CM | POA: Diagnosis not present

## 2020-05-16 DIAGNOSIS — M85851 Other specified disorders of bone density and structure, right thigh: Secondary | ICD-10-CM | POA: Diagnosis not present

## 2020-05-16 DIAGNOSIS — M810 Age-related osteoporosis without current pathological fracture: Secondary | ICD-10-CM | POA: Diagnosis not present

## 2020-06-06 ENCOUNTER — Other Ambulatory Visit: Payer: Self-pay | Admitting: Hematology

## 2020-06-06 DIAGNOSIS — M533 Sacrococcygeal disorders, not elsewhere classified: Secondary | ICD-10-CM | POA: Diagnosis not present

## 2020-06-06 DIAGNOSIS — C50411 Malignant neoplasm of upper-outer quadrant of right female breast: Secondary | ICD-10-CM

## 2020-06-06 DIAGNOSIS — F1190 Opioid use, unspecified, uncomplicated: Secondary | ICD-10-CM | POA: Diagnosis not present

## 2020-06-06 DIAGNOSIS — G8929 Other chronic pain: Secondary | ICD-10-CM | POA: Diagnosis not present

## 2020-06-06 DIAGNOSIS — Z9689 Presence of other specified functional implants: Secondary | ICD-10-CM | POA: Diagnosis not present

## 2020-06-06 DIAGNOSIS — G894 Chronic pain syndrome: Secondary | ICD-10-CM | POA: Diagnosis not present

## 2020-06-06 DIAGNOSIS — Z79899 Other long term (current) drug therapy: Secondary | ICD-10-CM | POA: Diagnosis not present

## 2020-06-06 DIAGNOSIS — Z5181 Encounter for therapeutic drug level monitoring: Secondary | ICD-10-CM | POA: Diagnosis not present

## 2020-06-06 DIAGNOSIS — M545 Low back pain, unspecified: Secondary | ICD-10-CM | POA: Diagnosis not present

## 2020-06-07 ENCOUNTER — Telehealth: Payer: Self-pay | Admitting: Hematology

## 2020-06-07 NOTE — Telephone Encounter (Signed)
Scheduled appt per 3/10 sch msg. Called pt no answer. Left vm with appt date and time. Let pt know appt is a phone visit.

## 2020-06-20 NOTE — Progress Notes (Incomplete)
[place]   [phone] [phone]) [phone]   Clinic Follow up Note   Patient Care Team: Mayra Neer, MD as PCP - General (Family Medicine) Holley Bouche, NP (Inactive) as Nurse Practitioner (Nurse Practitioner) [name], MD as Consulting Physician (General Surgery) Truitt Merle, MD as Consulting Physician (Hematology) Sylvan Cheese, NP as Nurse Practitioner (Hematology and Oncology) Crissie Reese, MD as Consulting Physician (Plastic Surgery)   I connected with [name] on 06/20/2020 at  2:20 PM EDT by {Blank single:19197::"video enabled telemedicine visit","telephone visit"} and verified that I am speaking with the correct person using two identifiers.  I discussed the limitations, risks, security and privacy concerns of performing an evaluation and management service by telephone and the availability of in person appointments. I also discussed with the patient that there may be a patient responsible charge related to this service. The patient expressed understanding and agreed to proceed.   Other persons participating in the visit and their role in the encounter:  ***  Patient's location:  *** Provider's location:  ***  CHIEF COMPLAINT: Follow up bilateral breast cancer  SUMMARY OF ONCOLOGIC HISTORY: Oncology History Overview Note  Breast cancer of upper-outer quadrant of RIGHT female breast ([place])   Staging form: Breast, AJCC 7th Edition Clinical stage from 09/06/2014: Stage 0 (Tis (DCIS), N0, M0)  Pathologic stage from 01/25/2015: Stage 0 (Tis (DCIS), N0, cM0)  ------  Breast cancer of upper-inner quadrant of LEFT female breast ([place])   Staging form: Breast, AJCC 7th Edition Clinical stage from 06/20/2015: Stage IA (T1c, N0, M0)      Pathologic stage from 07/05/2015: Stage IIA (T2, N0, cM0)    Breast cancer of upper-outer quadrant of right female breast ([place])  08/23/2014 Mammogram   Mammogram and ultrasound showed a 3cm  cystic lesion at 8:30 of right breast, and a 7 mm and a 5 mm increased density lesion at 10:00 of the right breast.   08/29/2014 Initial Biopsy   Right breast core needle bx x 2: high-grade DCIS, with focus of mildly suspicious for early stromal invasion. ER- (0%), PR- (0%).    08/29/2014 Clinical Stage   Stage 0: Tis N0   09/13/2014 Surgery   Right lumpectomy/SLNB ([place]): High grade DCIS with necrosis and calcfications, positive margins. 5 LN removed and negative for malignancy (0/5). Grade 3.   09/13/2014 Pathologic Stage   Stage 0: Tis N0   09/29/2014 Surgery   Re-excision for positive margins; multiple breast margins re-excised, high-grade DCIS, measuring 1.0 cm, 3.0 cm, 2.0 cm, some margins are still positive or very close.    01/25/2015 Definitive Surgery   Right nipple-sparing mastectomy Barry Dienes): DCIS with calcifications, high grade, spans 9 cm, LCIS, fibrocystic changes with adenosis and calcifications, negative margins. 1 LN removed and negative. Imeediate reconstruction   03/23/2015 Survivorship   Survivorship care plan completed and mailed to patient in lieu of in person visit at request of patient.   Breast cancer of upper-inner quadrant of left female breast ([place])  06/28/2013 Imaging   DEXA scan: Osteopenia [name])   06/19/2015 Mammogram   Diagnostic mammogram and ultrasound showed a 1.3 cm in the upper inner quadrant of left breast, and a 0.9 mm simple cyst. Axilla was negative.   06/20/2015 Receptors her2   ER 100% positive, PR negative, Ki-67 30%, HER-2 negative   06/20/2015 Initial Biopsy   The left breast upper inner quadrant mass biopsy showed invasive ductal carcinoma, grade 2   06/20/2015 Initial  Diagnosis   Breast cancer of upper-inner quadrant of left female breast ([place])   07/05/2015 Surgery   Left breast nipple-sparing mastectomy and sentinel lymph node biopsy with immediate reconstruction [facility]ienes); [facility] Harlow [facility]ares [facility]   07/05/2015  Pathology Results   Left mastectomy: Invasive ductal carcinoma with extracellular mucin, grade 2, spanning 2.0 cm, margins were negative, lobular carcinoma in situ, 3 sentinel lymph nodes negative. PR repeated and remains negative. HER2 remains neg (ratio 1.21)   07/05/2015 Pathologic Stage   pT2, pN0: Stage IIA    07/05/2015 Oncotype testing   RS 28 (intermediate risk), which predicts 10 year risk of distant recurrence 18% with tamoxifen alone   07/30/2015 - 08/30/2015 Anti-estrogen oral therapy   Adjuvant exemestane 25 mg once daily, stopped after one month due to skin rash.    09/01/2015 -  Anti-estrogen oral therapy   Switched to anastrozole 72m daily.  Planned duration of treatment : 5-10 years ([facility]      CURRENT THERAPY:  Anastrozole 1 mg daily, started on 09/28/2015  INTERVAL HISTORY: *** Connie CARRApresents for a virtual follow up. {They identified themselves by birth date/face to face video.}    REVIEW OF SYSTEMS:  *** Constitutional: Denies fevers, chills or abnormal weight loss Eyes: Denies blurriness of vision Ears, nose, mouth, throat, and face: Denies mucositis or sore throat Respiratory: Denies cough, dyspnea or wheezes Cardiovascular: Denies palpitation, chest discomfort or lower extremity swelling Gastrointestinal:  Denies nausea, heartburn or change in bowel habits Skin: Denies abnormal skin rashes Lymphatics: Denies new lymphadenopathy or easy bruising Neurological:Denies numbness, tingling or new weaknesses Behavioral/Psych: Mood is stable, no new changes  All other systems were reviewed with the patient and are negative.  MEDICAL HISTORY:  Past Medical History:  Diagnosis Date  . Anxiety    takes Xanax daily as needed  . Arthritis   . Bilateral lower extremity edema    takes Furosemide daily as needed  . Breast cancer ([facility] oncologist-  dr YTruitt Merle-  right upper-outer quadrant    dx May 2016--- Stage 0  (Tis,N0,M0)  DCIS  Right breast---  09-13-2014   s/p  radioactive seed/ partial mastectomy / sln bx  . Chronic fatigue syndrome   . Chronic low back pain   . Complication of anesthesia    spinal cord stimulator- to be off for surgery  . Depression    takes Effexor daily  . Family history of adverse reaction to anesthesia    mom hard to wake up  . Fibromyalgia   . Headache(784.0)    couple of times a week  . Heart murmur   . History of bronchitis > 89yr[age]  . History of colon polyps    benign  . History of drug overdose    oxycontin  and oxycodone  Sept 2015--  now has narcan injection prescription  . History of DVT of lower extremity    2008--  BILATERAL  . History of hiatal hernia   . HTN (hypertension)    takes Amlodipine and Ramipril daily  . Hyperlipidemia    takes Zetia and Crestor  daily  . Joint pain   . Mitral valve prolapse    MILD /   PER PT ASYMPTOMATIC  . Muscle spasm    takes Zanaflex daily as needed  . Osteoporosis   . Peripheral neuropathy    takes Neurontin daily  . Peripheral vascular disease (HCC)    hx dvt's  . RSD (reflex sympathetic dystrophy)  left wrist and forearm from a fx  . RSD (reflex sympathetic dystrophy)   . Sjogren's disease ([place])   . Vertigo    takes Meclizine daily     SURGICAL HISTORY: Past Surgical History:  Procedure Laterality Date  . BACK SURGERY    . BREAST RECONSTRUCTION WITH PLACEMENT OF TISSUE EXPANDER AND FLEX HD (ACELLULAR HYDRATED DERMIS) Right 01/25/2015   Procedure: RIGHT BREAST RECONSTRUCTION WITH PLACEMENT OF IMPLANT AND ACELLULAR DERMAL MATRIX ;  Surgeon: Crissie Reese, MD;  Location: [place];  Service: Plastics;  Laterality: Right;  . BREAST RECONSTRUCTION WITH PLACEMENT OF TISSUE EXPANDER AND FLEX HD (ACELLULAR HYDRATED DERMIS) Left 07/05/2015   Procedure: LEFT BREAST RECONSTRUCTION WITH PLACEMENT OF TISSUE EXPANDER AND  ACELLULAR DERMAL MATRIX ;  Surgeon: Crissie Reese, MD;  Location: [place];  Service: Plastics;  Laterality: Left;  . COLONOSCOPY    .  ESOPHAGOGASTRODUODENOSCOPY    . eye lid surgery Bilateral   . FOOT SURGERY Bilateral 1996   MORTON'S NEUROMA  . INNER EAR SURGERY Right   . LUMBAR DISC SURGERY    . MASTECTOMY Right 01/25/2015   nipple sparing   . NASAL SINUS SURGERY    . NIPPLE SPARING MASTECTOMY/SENTINAL LYMPH NODE BIOPSY/RECONSTRUCTION/PLACEMENT OF TISSUE EXPANDER Left 07/05/2015   Procedure: LEFT NIPPLE SPARING MASTECTOMY WITH SENTINAL LYMPH NODE BIOPSY ;  Surgeon: [name], MD;  Location: [place];  Service: General;  Laterality: Left;  . OPEN REDUCTION INTERNAL FIXATION (ORIF) DISTAL RADIAL FRACTURE Right 08/09/2018   Procedure: OPEN REDUCTION INTERNAL FIXATION (ORIF) DISTAL RADIAL FRACTURE;  Surgeon: Milly Jakob, MD;  Location: [place];  Service: Orthopedics;  Laterality: Right;  . ORIF LEFT WRIST FX'S/  LEFT CARPAL TUNNEL RELEASE  1999  . POSTERIOR LUMBAR FUSION  01-30-2009   L4--5 Laminectmy w/ decompression/  bilateral L4--5 microdiskectomy and fusion  . RADIOACTIVE SEED GUIDED PARTIAL MASTECTOMY WITH AXILLARY SENTINEL LYMPH NODE BIOPSY Right 09/13/2014   Procedure: RADIOACTIVE SEED GUIDED PARTIAL MASTECTOMY WITH AXILLARY SENTINEL LYMPH NODE BIOPSY;  Surgeon: [name], MD;  Location: [place];  Service: General;  Laterality: Right;  . RE-EXCISION OF BREAST LUMPECTOMY Right 09/29/2014   Procedure: RE-EXCISION OF BREAST LUMPECTOMY;  Surgeon: [name], MD;  Location: [place];  Service: General;  Laterality: Right;  . RIGHT CARPAL TUNNEL RELEASE/  TRIGGER RELEASE RIGHT MIDDLE FINGER  06-18-2006  . RIGHT INFERIOR PARATHYROIDECTOMY  04-07-2006  . SIMPLE MASTECTOMY WITH AXILLARY SENTINEL NODE BIOPSY Right 01/25/2015   Procedure: RIGHT NIPPLE SPARING MASTECTOMY;  Surgeon: [name], MD;  Location: [place];  Service: General;  Laterality: Right;  . SPINAL CORD STIMULATOR IMPLANT  2013  . TONSILLECTOMY  as child    I have reviewed the social history and family  history with the patient and they are unchanged from previous note.  ALLERGIES:  is allergic to carbocaine [mepivacaine hcl], penicillins, sulfa antibiotics, amlodipine, and latex.  MEDICATIONS:  Current Outpatient Medications  Medication Sig Dispense Refill  . ALPRAZolam (XANAX) 0.5 MG tablet Take 0.5-1 mg by mouth 2 (two) times daily as needed for anxiety. Take 1 tablet (0.5 mg) by mouth if needed during the day, and 2 tablets (1 mg) if needed at bedtime    . anastrozole (ARIMIDEX) 1 MG tablet TAKE 1 TABLET BY MOUTH EVERY DAY 90 tablet 3  . Ascorbic Acid (VITAMIN C) 1000 MG tablet Take 1,000 mg by mouth daily.    . calcium carbonate (OS-CAL) 600 MG TABS tablet Take 600 mg  by mouth daily with breakfast.    . Cholecalciferol (VITAMIN D) 50 MCG (2000 UT) tablet Take 2,000 Units by mouth daily.    . cyclobenzaprine (FLEXERIL) 10 MG tablet Take 10 mg by mouth 3 (three) times daily as needed for muscle spasms.    Marland Kitchen ezetimibe (ZETIA) 10 MG tablet Take 10 mg by mouth at bedtime.     . Flaxseed, Linseed, (FLAX SEED OIL) 1000 MG CAPS Take by mouth.    . furosemide (LASIX) 40 MG tablet Take 40 mg by mouth daily as needed for fluid or edema (leg swelling). Reported on 06/29/2015  1  . gabapentin (NEURONTIN) 600 MG tablet Take 600 mg by mouth 3 (three) times daily.      . Lactobacillus (PROBIOTIC ACIDOPHILUS PO) Take 1 tablet by mouth every morning.    . Omega-3 Fatty Acids (FISH OIL) 1000 MG CAPS Take by mouth.    Marland Kitchen OVER THE COUNTER MEDICATION Take 1 drop by mouth daily as needed (dry eyes). OTC lubricating eye drops    . oxyCODONE-acetaminophen (PERCOCET) 10-325 MG tablet Take 1 tablet by mouth every 4 (four) hours as needed for pain.    Marland Kitchen PROAIR HFA 108 (90 Base) MCG/ACT inhaler Inhale 2 puffs into the lungs every 4 (four) hours as needed.  2  . ramipril (ALTACE) 10 MG capsule Take 10 mg by mouth daily.    . rosuvastatin (CRESTOR) 20 MG tablet Take 20 mg by mouth daily.    Marland Kitchen tiZANidine (ZANAFLEX) 4 MG  tablet Take 4 mg by mouth every 6 (six) hours as needed for muscle spasms.    Marland Kitchen venlafaxine (EFFEXOR-XR) 150 MG 24 hr capsule Take 150 mg by mouth 2 (two) times daily. Take 1 capsule (150 mg) by mouth daily with breakfast and lunch     No current facility-administered medications for this visit.    PHYSICAL EXAMINATION: ECOG PERFORMANCE STATUS: {CHL ONC ECOG PS:[phone]}  No vitals taken today, Exam not performed today   LABORATORY DATA:  I have reviewed the data as listed CBC Latest Ref Rng & Units 04/25/2020 10/14/2019 12/31/2018  WBC 4.0 - 10.5 K/uL 8.9 7.8 8.4  Hemoglobin 12.0 - 15.0 g/dL 10.5(L) 10.7(L) 10.3(L)  Hematocrit 36.0 - 46.0 % 33.0(L) 33.6(L) 32.3(L)  Platelets 150 - 400 K/uL 196 215 207     CMP Latest Ref Rng & Units 04/25/2020 10/14/2019 12/31/2018  Glucose 70 - 99 mg/dL 89 94 105(H)  BUN 8 - 23 mg/dL 16 14 13   Creatinine 0.44 - 1.00 mg/dL 1.33(H) 1.36(H) 1.17(H)  Sodium 135 - 145 mmol/L 143 143 143  Potassium 3.5 - 5.1 mmol/L 3.8 3.6 4.3  Chloride 98 - 111 mmol/L 107 106 106  CO2 22 - 32 mmol/L 28 27 29   Calcium 8.9 - 10.3 mg/dL 9.7 10.6(H) 10.0  Total Protein 6.5 - 8.1 g/dL 6.8 7.0 6.9  Total Bilirubin 0.3 - 1.2 mg/dL <0.2(L) 0.3 0.3  Alkaline Phos 38 - 126 U/L 74 84 66  AST 15 - 41 U/L 25 30 32  ALT 0 - 44 U/L 23 21 19       RADIOGRAPHIC STUDIES: I have personally reviewed the radiological images as listed and agreed with the findings in the report. No results found.   ASSESSMENT & PLAN:  Connie Bennett is a 70 y.o. female with   1. Breast cancer of upper inner quadrant of left breast, Invasive ductal carcinoma, pT2N0M0, stage IIA, G2, ER+/PR-/HER2-, ONCOTYPE RS 28 -She was diagnosed in 05/2015. She  is s/p left breast mastectomy. -Oncotype result and adjuvant chemo was discussed but decided not to pursue -She started anti-estrogen therapy with exemestane in 07/2015, but stopped after 1 month use due to skin rash. She started anastrozole in  08/2015,tolerating well,plan for7years. -She is clinically doing well. Lab reviewed, her CBC and CMP are within normal limits except Hg 10.5. Her physical exam was unremarkable. There is no clinical concern for recurrence. -Continue surveillance. Given she is s/p b/l mastectomy, she does not require mammograms.  -Continue Anastrozole  -F/u in 6 months   2.Extensive right breast DCIS, >10cm, high grade ER/PR negative -She was diagnosed in 07/2014. She is s/p right lumpectomy andmultiplere-excision which resulted in her ultimately having a right breast mastectomy which cured her.   3.Osteopenia -Her 08/2015 DEXA showed osteopenia, stable comparing to 2015. 11/2017 DEXA shows Osteopenia with lowest T-score -2.2 at left femur neck. Stable from 2017. -She previously receivedbisphosphonate, per pt -I encouraged her to take calcium and Vitamin Dand do weight bearing exercise.  -She plans to repeat DEXA in 2022.   4. Arthritis, chronic back pain, hypertension,  -She will continue follow-up with her primary care physicianDr Brigitte Pulse every 6 weeks. -[facility], butpreviouslylost f/u.  -Stable   5. Anemia -Possibly anemia of chronic disease secondary to CKD -Her iron study, folic acid, and A45 levels were normal in September 2017. -Mild and stable. Iron panel still pending (04/25/20).  -Continue oral iron daily.    6. Cancer Screening -She states she had a colonoscopy in Jan 2019, recommendation was f/u in 5 years.   Plan -Continue anastrozole 1 mg once daily -Lab and f/u in 6 months    No problem-specific Assessment & Plan notes found for this encounter.   No orders of the defined types were placed in this encounter.  I discussed the assessment and treatment plan with the patient. The patient was provided an opportunity to ask questions and all were answered. The patient agreed with the plan and demonstrated an understanding of  the instructions.  The patient was advised to call back or seek an in-person evaluation if the symptoms worsen or if the condition fails to improve as anticipated.  The total time spent in the appointment was {CHL ONC TIME VISIT - XMIWO:[id number]}.    Connie Bennett 06/20/2020   Oneal Deputy, am acting as scribe for Truitt Merle, MD.   {Add scribe attestation statement}

## 2020-06-22 ENCOUNTER — Other Ambulatory Visit: Payer: Self-pay

## 2020-06-22 ENCOUNTER — Inpatient Hospital Stay: Payer: PPO | Admitting: Hematology

## 2020-08-15 DIAGNOSIS — S12401A Unspecified nondisplaced fracture of fifth cervical vertebra, initial encounter for closed fracture: Secondary | ICD-10-CM | POA: Diagnosis not present

## 2020-08-15 DIAGNOSIS — M47812 Spondylosis without myelopathy or radiculopathy, cervical region: Secondary | ICD-10-CM | POA: Diagnosis not present

## 2020-08-16 ENCOUNTER — Other Ambulatory Visit: Payer: Self-pay | Admitting: Physician Assistant

## 2020-08-16 ENCOUNTER — Other Ambulatory Visit: Payer: Self-pay

## 2020-08-16 ENCOUNTER — Ambulatory Visit
Admission: RE | Admit: 2020-08-16 | Discharge: 2020-08-16 | Disposition: A | Discharge status: Home / Self Care | Payer: PPO | Source: Ambulatory Visit | Attending: Physician Assistant | Admitting: Physician Assistant

## 2020-08-16 DIAGNOSIS — S12401A Unspecified nondisplaced fracture of fifth cervical vertebra, initial encounter for closed fracture: Secondary | ICD-10-CM | POA: Diagnosis not present

## 2020-08-16 DIAGNOSIS — S199XXA Unspecified injury of neck, initial encounter: Secondary | ICD-10-CM | POA: Diagnosis not present

## 2020-08-16 DIAGNOSIS — M47812 Spondylosis without myelopathy or radiculopathy, cervical region: Secondary | ICD-10-CM | POA: Diagnosis not present

## 2020-08-16 DIAGNOSIS — I6529 Occlusion and stenosis of unspecified carotid artery: Secondary | ICD-10-CM | POA: Diagnosis not present

## 2020-08-17 DIAGNOSIS — S12401A Unspecified nondisplaced fracture of fifth cervical vertebra, initial encounter for closed fracture: Secondary | ICD-10-CM | POA: Diagnosis not present

## 2020-08-17 DIAGNOSIS — M47812 Spondylosis without myelopathy or radiculopathy, cervical region: Secondary | ICD-10-CM | POA: Diagnosis not present

## 2020-08-22 DIAGNOSIS — H2513 Age-related nuclear cataract, bilateral: Secondary | ICD-10-CM | POA: Diagnosis not present

## 2020-09-06 DIAGNOSIS — G894 Chronic pain syndrome: Secondary | ICD-10-CM | POA: Diagnosis not present

## 2020-09-06 DIAGNOSIS — M961 Postlaminectomy syndrome, not elsewhere classified: Secondary | ICD-10-CM | POA: Diagnosis not present

## 2020-09-06 DIAGNOSIS — M533 Sacrococcygeal disorders, not elsewhere classified: Secondary | ICD-10-CM | POA: Diagnosis not present

## 2020-09-06 DIAGNOSIS — M545 Low back pain, unspecified: Secondary | ICD-10-CM | POA: Diagnosis not present

## 2020-09-06 DIAGNOSIS — F1190 Opioid use, unspecified, uncomplicated: Secondary | ICD-10-CM | POA: Diagnosis not present

## 2020-09-06 DIAGNOSIS — G8929 Other chronic pain: Secondary | ICD-10-CM | POA: Diagnosis not present

## 2020-09-06 DIAGNOSIS — Z9689 Presence of other specified functional implants: Secondary | ICD-10-CM | POA: Diagnosis not present

## 2020-09-06 DIAGNOSIS — M62838 Other muscle spasm: Secondary | ICD-10-CM | POA: Diagnosis not present

## 2020-10-03 DIAGNOSIS — F320 Major depressive disorder, single episode, mild: Secondary | ICD-10-CM | POA: Diagnosis not present

## 2020-10-23 NOTE — Progress Notes (Signed)
[place]   [phone] [phone]   Clinic Follow up Note   Patient Care Team: Mayra Neer, MD as PCP - General (Family Medicine) Holley Bouche, NP (Inactive) as Nurse Practitioner (Nurse Practitioner) [name], MD as Consulting Physician (General Surgery) Truitt Merle, MD as Consulting Physician (Hematology) Sylvan Cheese, NP as Nurse Practitioner (Hematology and Oncology) Crissie Reese, MD as Consulting Physician (Plastic Surgery)  Date of Service:  10/24/2020  CHIEF COMPLAINT: Follow up bilateral breast cancer   SUMMARY OF ONCOLOGIC HISTORY: Oncology History Overview Note  Breast cancer of upper-outer quadrant of RIGHT female breast [facility])   Staging form: Breast, AJCC 7th Edition Clinical stage from 09/06/2014: Stage 0 (Tis (DCIS), N0, M0)  Pathologic stage from 01/25/2015: Stage 0 (Tis (DCIS), N0, cM0)  ------  Breast cancer of upper-inner quadrant of LEFT female breast ([place])   Staging form: Breast, AJCC 7th Edition Clinical stage from 06/20/2015: Stage IA (T1c, N0, M0)      Pathologic stage from 07/05/2015: Stage IIA (T2, N0, cM0)     Breast cancer of upper-outer quadrant of right female breast ([place])  08/23/2014 Mammogram   Mammogram and ultrasound showed a 3cm cystic lesion at 8:30 of right breast, and a 7 mm and a 5 mm increased density lesion at 10:00 of the right breast.    08/29/2014 Initial Biopsy   Right breast core needle bx x 2: high-grade DCIS, with focus of mildly suspicious for early stromal invasion. ER- (0%), PR- (0%).     08/29/2014 Clinical Stage   Stage 0: Tis N0    09/13/2014 Surgery   Right lumpectomy/SLNB ([place]): High grade DCIS with necrosis and calcfications, positive margins. 5 LN removed and negative for malignancy (0/5). Grade 3.    09/13/2014 Pathologic Stage   Stage 0: Tis N0    09/29/2014 Surgery   Re-excision for positive margins; multiple breast margins re-excised, high-grade  DCIS, measuring 1.0 cm, 3.0 cm, 2.0 cm, some margins are still positive or very close.     01/25/2015 Definitive Surgery   Right nipple-sparing mastectomy [facility]): DCIS with calcifications, high grade, spans 9 cm, LCIS, fibrocystic changes with adenosis and calcifications, negative margins. 1 LN removed and negative. Imeediate reconstruction    03/23/2015 Survivorship   Survivorship care plan completed and mailed to patient in lieu of in person visit at request of patient.    Breast cancer of upper-inner quadrant of left female breast ([place])  06/28/2013 Imaging   DEXA scan: Osteopenia [name])    06/19/2015 Mammogram   Diagnostic mammogram and ultrasound showed a 1.3 cm in the upper inner quadrant of left breast, and a 0.9 mm simple cyst. Axilla was negative.    06/20/2015 Receptors her2   ER 100% positive, PR negative, Ki-67 30%, HER-2 negative    06/20/2015 Initial Biopsy   The left breast upper inner quadrant mass biopsy showed invasive ductal carcinoma, grade 2    06/20/2015 Initial Diagnosis   Breast cancer of upper-inner quadrant of left female breast ([place])    07/05/2015 Surgery   Left breast nipple-sparing mastectomy and sentinel lymph node biopsy with immediate reconstruction [facility]ien[facility]); reconstruction with Dr. Harlow Mares at [facility]    07/05/2015 Pathology Results   Left mastectomy: Invasive ductal carcinoma with extracellular mucin, grade 2, spanning 2.0 cm, margins were negative, lobular carcinoma in situ, 3 sentinel lymph nodes negative. PR repeated and remains negative. HER2 remains neg (ratio 1.21)    07/05/2015 Pathologic Stage   pT2, pN0:  Stage IIA     07/05/2015 Oncotype testing   RS 28 (intermediate risk), which predicts 10 year risk of distant recurrence 18% with tamoxifen alone    07/30/2015 - 08/30/2015 Anti-estrogen oral therapy   Adjuvant exemestane 25 mg once daily, stopped after one month due to skin rash.     09/01/2015 -  Anti-estrogen oral therapy    Switched to anastrozole 91m daily.  Planned duration of treatment : 5-10 years (Burr Medico      CURRENT THERAPY:  Anastrozole 1 mg daily, started on 09/28/2015  INTERVAL HISTORY:  Connie SCHUITEMAis here for a follow up of b/l breast cancer. She was last seen by me 6 months ago.  She is concerned about her weight loss, she lost about 4 lbs in past 6 months  She has severe back and b/l hips pain, she is going to see her orth today    All other systems were reviewed with the patient and are negative.  MEDICAL HISTORY:  Past Medical History:  Diagnosis Date   Anxiety    takes Xanax daily as needed   Arthritis    Bilateral lower extremity edema    takes Furosemide daily as needed   Breast cancer ([facility] oncologist-  dr YTruitt Merle-  right upper-outer quadrant    dx May 2016--- Stage 0  (Tis,N0,M0)  DCIS  Right breast---  09-13-2014  s/p  radioactive seed/ partial mastectomy / sln bx   Chronic fatigue syndrome    Chronic low back pain    Complication of anesthesia    spinal cord stimulator- to be off for surgery   Depression    takes Effexor daily   Family history of adverse reaction to anesthesia    mom hard to wake up   Fibromyalgia    Headache(784.0)    couple of times a week   Heart murmur    History of bronchitis > [place]   History of colon polyps    benign   History of drug overdose    oxycontin  and oxycodone  Sept 2015--  now has narcan injection prescription   History of DVT of lower extremity    2008--  BILATERAL   History of hiatal hernia    HTN (hypertension)    takes Amlodipine and Ramipril daily   Hyperlipidemia    takes Zetia and Crestor  daily   Joint pain    Mitral valve prolapse    MILD /   PER PT ASYMPTOMATIC   Muscle spasm    takes Zanaflex daily as needed   Osteoporosis    Peripheral neuropathy    takes Neurontin daily   Peripheral vascular disease (HCC)    hx dvt's   RSD (reflex sympathetic dystrophy)    left wrist and forearm from a fx   RSD  (reflex sympathetic dystrophy)    [place]'s disease [place]   Vertigo    takes Meclizine daily     SURGICAL HISTORY: Past Surgical History:  Procedure Laterality Date   BACK SURGERY     BREAST RECONSTRUCTION WITH PLACEMENT OF TISSUE EXPANDER AND FLEX HD (ACELLULAR HYDRATED DERMIS) Right 01/25/2015   Procedure: RIGHT BREAST RECONSTRUCTION WITH PLACEMENT OF IMPLANT AND ACELLULAR DERMAL MATRIX ;  Surgeon: DaCrissie ReeseMD;  [facility] Service: [facility]astics;  Laterality: Right;   BREAST RECONSTRUCTION WITH PLACEMENT OF TISSUE EXPANDER AND FLEX HD (ACELLULAR HYDRATED DERMIS) Left 07/05/2015   Procedure: LEFT BREAST RECONSTRUCTION WITH PLACEMENT OF TISSUE EXPANDER AND  ACELLULAR DERMAL MATRIX ;  Surgeon: Crissie Reese, MD;  Location: [place];  Service: Plastics;  Laterality: Left;   COLONOSCOPY     ESOPHAGOGASTRODUODENOSCOPY     eye lid surgery Bilateral    FOOT SURGERY Bilateral 1996   MORTON'S NEUROMA   INNER EAR SURGERY Right    LUMBAR [place] SURGERY     MASTECTOMY Right 01/25/2015   nipple sparing    NASAL SINUS SURGERY     NIPPLE SPARING MASTECTOMY/SENTINAL LYMPH NODE BIOPSY/RECONSTRUCTION/PLACEMENT OF TISSUE EXPANDER Left 07/05/2015   Procedure: LEFT NIPPLE SPARING MASTECTOMY WITH SENTINAL LYMPH NODE BIOPSY ;  Surgeon: [name], MD;  Location: [place];  Service: General;  Laterality: Left;   OPEN REDUCTION INTERNAL FIXATION (ORIF) DISTAL RADIAL FRACTURE Right 08/09/2018   Procedure: OPEN REDUCTION INTERNAL FIXATION (ORIF) DISTAL RADIAL FRACTURE;  Surgeon: Milly Jakob, MD;  Location: [place];  Service: Orthopedics;  Laterality: Right;   ORIF LEFT WRIST FX'S/  LEFT CARPAL TUNNEL RELEASE  1999   POSTERIOR LUMBAR FUSION  01-30-2009   L4--5 Laminectmy w/ decompression/  bilateral L4--5 microdiskectomy and fusion   RADIOACTIVE SEED GUIDED PARTIAL MASTECTOMY WITH AXILLARY SENTINEL LYMPH NODE BIOPSY Right 09/13/2014   Procedure: RADIOACTIVE SEED GUIDED PARTIAL MASTECTOMY WITH  AXILLARY SENTINEL LYMPH NODE BIOPSY;  Surgeon: [name], MD;  Location: [place];  Service: General;  Laterality: Right;   RE-EXCISION OF BREAST LUMPECTOMY Right 09/29/2014   Procedure: RE-EXCISION OF BREAST LUMPECTOMY;  Surgeon: [name], MD;  Location: [place];  Service: General;  Laterality: Right;   RIGHT CARPAL TUNNEL RELEASE/  TRIGGER RELEASE RIGHT MIDDLE FINGER  06-18-2006   RIGHT INFERIOR PARATHYROIDECTOMY  04-07-2006   SIMPLE MASTECTOMY WITH AXILLARY SENTINEL NODE BIOPSY Right 01/25/2015   Procedure: RIGHT NIPPLE [place];  Surgeon: [name], MD;  Location: [place];  Service: General;  Laterality: Right;   SPINAL CORD STIMULATOR IMPLANT  2013   TONSILLECTOMY  as child    I have reviewed the social history and family history with the patient and they are unchanged from previous note.  ALLERGIES:  is allergic to carbocaine [mepivacaine hcl], penicillins, sulfa antibiotics, amlodipine, and latex.  MEDICATIONS:  Current Outpatient Medications  Medication Sig Dispense Refill   alendronate (FOSAMAX) 70 MG tablet SMARTSIG:1 Tablet(s) By Mouth     ALPRAZolam (XANAX) 0.5 MG tablet Take 0.5-1 mg by mouth 2 (two) times daily as needed for anxiety. Take 1 tablet (0.5 mg) by mouth if needed during the day, and 2 tablets (1 mg) if needed at bedtime     anastrozole (ARIMIDEX) 1 MG tablet TAKE 1 TABLET BY MOUTH EVERY DAY 90 tablet 3   Ascorbic Acid (VITAMIN C) 1000 MG tablet Take 1,000 mg by mouth daily.     calcium carbonate (OS-CAL) 600 MG TABS tablet Take 600 mg by mouth daily with breakfast.     Cholecalciferol (VITAMIN D) 50 MCG (2000 UT) tablet Take 2,000 Units by mouth daily.     cyclobenzaprine (FLEXERIL) 10 MG tablet Take 10 mg by mouth 3 (three) times daily as needed for muscle spasms.     ezetimibe (ZETIA) 10 MG tablet Take 10 mg by mouth at bedtime.      Flaxseed, Linseed, (FLAX SEED OIL) 1000 MG CAPS Take by mouth.     furosemide  (LASIX) 40 MG tablet Take 40 mg by mouth daily as needed for fluid or edema (leg swelling). Reported on 06/29/2015  1   gabapentin (NEURONTIN) 600 MG  tablet Take 600 mg by mouth 3 (three) times daily.       Lactobacillus (PROBIOTIC ACIDOPHILUS PO) Take 1 tablet by mouth every morning.     Omega-3 Fatty Acids (FISH OIL) 1000 MG CAPS Take by mouth.     OVER THE COUNTER MEDICATION Take 1 drop by mouth daily as needed (dry eyes). OTC lubricating eye drops     oxyCODONE-acetaminophen (PERCOCET) 10-325 MG tablet Take 1 tablet by mouth every 4 (four) hours as needed for pain.     PROAIR HFA 108 (90 Base) MCG/ACT inhaler Inhale 2 puffs into the lungs every 4 (four) hours as needed.  2   ramipril (ALTACE) 10 MG capsule Take 10 mg by mouth daily.     rosuvastatin (CRESTOR) 20 MG tablet Take 20 mg by mouth daily.     tiZANidine (ZANAFLEX) 4 MG tablet Take 4 mg by mouth every 6 (six) hours as needed for muscle spasms.     venlafaxine (EFFEXOR-XR) 150 MG 24 hr capsule Take 150 mg by mouth 2 (two) times daily. Take 1 capsule (150 mg) by mouth daily with breakfast and lunch     No current facility-administered medications for this visit.    PHYSICAL EXAMINATION: ECOG PERFORMANCE STATUS: 0 - Asymptomatic  Vitals:   10/24/20 1054  BP: (!) 152/72  Pulse: 79  Resp: 18  Temp: 98.5 F (36.9 C)  SpO2: 100%    Filed Weights   10/24/20 1054  Weight: 126 lb (57.2 kg)     GENERAL:alert, no distress and comfortable SKIN: skin color, texture, turgor are normal, no rashes or significant lesions EYES: normal, Conjunctiva are pink and non-injected, sclera clear  NECK: supple, thyroid normal size, non-tender, without nodularity LYMPH:  no palpable lymphadenopathy in the cervical, axillary  LUNGS: clear to auscultation and percussion with normal breathing effort HEART: regular rate & rhythm and no murmurs and no lower extremity edema ABDOMEN:abdomen soft, non-tender and normal bowel sounds Musculoskeletal:no  cyanosis of digits and no clubbing  NEURO: alert & oriented x 3 with fluent speech, no focal motor/sensory deficits BREAST: S/p b/l mastectomy with implants in place: surgical incision healed well. No palpable mass, nodules or adenopathy bilaterally. Breast exam benign.   LABORATORY DATA:  I have reviewed the data as listed CBC Latest Ref Rng & Units 04/25/2020 10/14/2019 12/31/2018  WBC 4.0 - 10.5 K/uL 8.9 7.8 8.4  Hemoglobin 12.0 - 15.0 g/dL 10.5(L) 10.7(L) 10.3(L)  Hematocrit 36.0 - 46.0 % 33.0(L) 33.6(L) 32.3(L)  Platelets 150 - 400 K/uL 196 215 207     CMP Latest Ref Rng & Units 04/25/2020 10/14/2019 12/31/2018  Glucose 70 - 99 mg/dL 89 94 105(H)  BUN 8 - 23 mg/dL 16 14 13   Creatinine 0.44 - 1.00 mg/dL 1.33(H) 1.36(H) 1.17(H)  Sodium 135 - 145 mmol/L 143 143 143  Potassium 3.5 - 5.1 mmol/L 3.8 3.6 4.3  Chloride 98 - 111 mmol/L 107 106 106  CO2 22 - 32 mmol/L 28 27 29   Calcium 8.9 - 10.3 mg/dL 9.7 10.6(H) 10.0  Total Protein 6.5 - 8.1 g/dL 6.8 7.0 6.9  Total Bilirubin 0.3 - 1.2 mg/dL <0.2(L) 0.3 0.3  Alkaline Phos 38 - 126 U/L 74 84 66  AST 15 - 41 U/L 25 30 32  ALT 0 - 44 U/L 23 21 19       RADIOGRAPHIC STUDIES: I have personally reviewed the radiological images as listed and agreed with the findings in the report. No results found.   ASSESSMENT &  PLAN:  Connie Bennett is a 70 y.o. female with   1. Breast cancer of upper inner quadrant of left breast,  Invasive ductal carcinoma, pT2N0M0, stage IIA, G2, ER+/PR-/HER2-, ONCOTYPE RS 28  -She was diagnosed in 05/2015. She is s/p left breast mastectomy.  -Oncotype result and adjuvant chemo was discussed but decided not to pursue  -She started anti-estrogen therapy with exemestane in 07/2015, but stopped after 1 month use due to skin rash. She started anastrozole in 08/2015, tolerating well, plan for 7 years.  -we discussed switching anastrozole to tamoxifen due to her severe arthralgia, due to her previously thrombosis, we decided  not to switch. -We will continue anastrozole for 2 more years.   2. Extensive right breast DCIS, >10cm, high grade ER/PR negative -She was diagnosed in 07/2014. She is s/p right lumpectomy and multiple re-excision which resulted in her ultimately having a right breast mastectomy which cured her.    3. Osteoporosis  -Her 08/2015 DEXA showed osteopenia, stable comparing to 2015. 11/2017 DEXA shows Osteopenia with lowest T-score -2.2 at left femur neck. Stable -bone density scan in February 2022 showed T score -2.9 at the left femur neck, she has osteoporosis now -Her PCP has started her on Fosamax.  Due to her previous history of breast cancer, I recommend switch to IV Zometa every 6 months for 2 years.  It has additional benefit of reducing bone metastasis from breast cancer.  Side effects discussed with her, especially risk of jaw necrosis, she agrees to proceed.   4. Arthritis, chronic back pain, hypertension,  -She will continue follow-up with her primary care physician Dr Brigitte Pulse every 6 weeks.  -She was previously being followed by [facility], but previously lost f/u.  -Stable    5. Anemia -Possibly anemia of chronic disease secondary to CKD -Her iron study, folic acid, and V79 levels were normal in September 2017. -Mild and stable. Iron panel still pending (04/25/20).  -Continue oral iron daily.  Stable         Plan -We will switch Fosamax to IV Zometa infusion every 6 months, starting in 2 to 3 weeks -Continue anastrozole -She will follow-up with orthopedics for her severe arthralgia -Lab and follow-up in 7 months with Zometa infusion   No problem-specific Assessment & Plan notes found for this encounter.   No orders of the defined types were placed in this encounter.  All questions were answered. The patient knows to call the clinic with any problems, questions or concerns. No barriers to learning was detected. The total time spent in the appointment was 30 minutes.      Truitt Merle, MD 10/24/2020

## 2020-10-24 ENCOUNTER — Inpatient Hospital Stay: Payer: PPO

## 2020-10-24 ENCOUNTER — Encounter: Payer: Self-pay | Admitting: Hematology

## 2020-10-24 ENCOUNTER — Inpatient Hospital Stay: Payer: PPO | Attending: Hematology | Admitting: Hematology

## 2020-10-24 ENCOUNTER — Other Ambulatory Visit: Payer: Self-pay

## 2020-10-24 VITALS — BP 152/72 | HR 79 | Temp 98.5°F | Resp 18 | Ht 63.0 in | Wt 126.0 lb

## 2020-10-24 DIAGNOSIS — Z79899 Other long term (current) drug therapy: Secondary | ICD-10-CM | POA: Insufficient documentation

## 2020-10-24 DIAGNOSIS — C50412 Malignant neoplasm of upper-outer quadrant of left female breast: Secondary | ICD-10-CM | POA: Insufficient documentation

## 2020-10-24 DIAGNOSIS — Z853 Personal history of malignant neoplasm of breast: Secondary | ICD-10-CM | POA: Insufficient documentation

## 2020-10-24 DIAGNOSIS — C50411 Malignant neoplasm of upper-outer quadrant of right female breast: Secondary | ICD-10-CM

## 2020-10-24 DIAGNOSIS — D638 Anemia in other chronic diseases classified elsewhere: Secondary | ICD-10-CM

## 2020-10-24 DIAGNOSIS — M7061 Trochanteric bursitis, right hip: Secondary | ICD-10-CM | POA: Diagnosis not present

## 2020-10-24 DIAGNOSIS — M7062 Trochanteric bursitis, left hip: Secondary | ICD-10-CM | POA: Diagnosis not present

## 2020-10-24 DIAGNOSIS — Z170 Estrogen receptor positive status [ER+]: Secondary | ICD-10-CM | POA: Diagnosis not present

## 2020-10-24 DIAGNOSIS — Z86718 Personal history of other venous thrombosis and embolism: Secondary | ICD-10-CM | POA: Diagnosis not present

## 2020-10-24 DIAGNOSIS — Z9013 Acquired absence of bilateral breasts and nipples: Secondary | ICD-10-CM | POA: Insufficient documentation

## 2020-10-24 DIAGNOSIS — Z79811 Long term (current) use of aromatase inhibitors: Secondary | ICD-10-CM | POA: Insufficient documentation

## 2020-10-24 LAB — FERRITIN: Ferritin: 116 ng/mL (ref 11–307)

## 2020-10-24 LAB — IRON AND TIBC
Iron: 40 ug/dL — ABNORMAL LOW (ref 41–142)
Saturation Ratios: 14 % — ABNORMAL LOW (ref 21–57)
TIBC: 288 ug/dL (ref 236–444)
UIBC: 248 ug/dL (ref 120–384)

## 2020-10-30 ENCOUNTER — Telehealth: Payer: Self-pay

## 2020-10-30 ENCOUNTER — Other Ambulatory Visit: Payer: Self-pay | Admitting: Hematology

## 2020-10-30 DIAGNOSIS — Z170 Estrogen receptor positive status [ER+]: Secondary | ICD-10-CM

## 2020-10-30 DIAGNOSIS — C50411 Malignant neoplasm of upper-outer quadrant of right female breast: Secondary | ICD-10-CM

## 2020-10-30 NOTE — Telephone Encounter (Signed)
This nurse attempted to reach patient related to lab results and recommendations per Dr. Burr Medico.  Left a message for patient to return call to clinic.  No further questions or concerns at this time.

## 2020-10-30 NOTE — Telephone Encounter (Signed)
-----   Message from Truitt Merle, MD sent at 10/30/2020 12:25 PM EDT ----- Caryl Asp,  Please let pt know her iron level from last week was fine. However we did not do CBC and CMP, which is needed before her zometa infusion next week. She did have mild;ly elevated Cr which could be a problem for zometa infusion, so please let her come in this week and do lab, orders are in now. Please let me know when her lab results are back.  Butch Penny, plesae get zometa approved this week, thanks   Krista Blue

## 2020-11-02 ENCOUNTER — Other Ambulatory Visit: Payer: Self-pay

## 2020-11-02 ENCOUNTER — Inpatient Hospital Stay: Payer: PPO | Attending: Hematology

## 2020-11-02 DIAGNOSIS — Z79899 Other long term (current) drug therapy: Secondary | ICD-10-CM | POA: Insufficient documentation

## 2020-11-02 DIAGNOSIS — I10 Essential (primary) hypertension: Secondary | ICD-10-CM | POA: Diagnosis not present

## 2020-11-02 DIAGNOSIS — Z170 Estrogen receptor positive status [ER+]: Secondary | ICD-10-CM | POA: Diagnosis not present

## 2020-11-02 DIAGNOSIS — M8580 Other specified disorders of bone density and structure, unspecified site: Secondary | ICD-10-CM | POA: Insufficient documentation

## 2020-11-02 DIAGNOSIS — C50412 Malignant neoplasm of upper-outer quadrant of left female breast: Secondary | ICD-10-CM | POA: Diagnosis not present

## 2020-11-02 DIAGNOSIS — Z79811 Long term (current) use of aromatase inhibitors: Secondary | ICD-10-CM | POA: Insufficient documentation

## 2020-11-02 DIAGNOSIS — C50411 Malignant neoplasm of upper-outer quadrant of right female breast: Secondary | ICD-10-CM

## 2020-11-02 DIAGNOSIS — Z9013 Acquired absence of bilateral breasts and nipples: Secondary | ICD-10-CM | POA: Diagnosis not present

## 2020-11-02 DIAGNOSIS — D638 Anemia in other chronic diseases classified elsewhere: Secondary | ICD-10-CM | POA: Diagnosis not present

## 2020-11-02 DIAGNOSIS — Z86718 Personal history of other venous thrombosis and embolism: Secondary | ICD-10-CM | POA: Diagnosis not present

## 2020-11-02 LAB — CMP (CANCER CENTER ONLY)
ALT: 46 U/L — ABNORMAL HIGH (ref 0–44)
AST: 33 U/L (ref 15–41)
Albumin: 4.1 g/dL (ref 3.5–5.0)
Alkaline Phosphatase: 66 U/L (ref 38–126)
Anion gap: 9 (ref 5–15)
BUN: 22 mg/dL (ref 8–23)
CO2: 26 mmol/L (ref 22–32)
Calcium: 9.8 mg/dL (ref 8.9–10.3)
Chloride: 105 mmol/L (ref 98–111)
Creatinine: 1.44 mg/dL — ABNORMAL HIGH (ref 0.44–1.00)
GFR, Estimated: 39 mL/min — ABNORMAL LOW (ref >60)
Glucose, Bld: 86 mg/dL (ref 70–99)
Potassium: 4.6 mmol/L (ref 3.5–5.1)
Sodium: 140 mmol/L (ref 135–145)
Total Bilirubin: 0.3 mg/dL (ref 0.3–1.2)
Total Protein: 6.9 g/dL (ref 6.5–8.1)

## 2020-11-02 LAB — CBC WITH DIFFERENTIAL (CANCER CENTER ONLY)
Abs Immature Granulocytes: 0.05 10*3/uL (ref 0.00–0.07)
Basophils Absolute: 0.1 10*3/uL (ref 0.0–0.1)
Basophils Relative: 1 %
Eosinophils Absolute: 0.5 10*3/uL (ref 0.0–0.5)
Eosinophils Relative: 5 %
HCT: 32.7 % — ABNORMAL LOW (ref 36.0–46.0)
Hemoglobin: 11 g/dL — ABNORMAL LOW (ref 12.0–15.0)
Immature Granulocytes: 1 %
Lymphocytes Relative: 27 %
Lymphs Abs: 3 10*3/uL (ref 0.7–4.0)
MCH: 31.7 pg (ref 26.0–34.0)
MCHC: 33.6 g/dL (ref 30.0–36.0)
MCV: 94.2 fL (ref 80.0–100.0)
Monocytes Absolute: 0.8 10*3/uL (ref 0.1–1.0)
Monocytes Relative: 8 %
Neutro Abs: 6.4 10*3/uL (ref 1.7–7.7)
Neutrophils Relative %: 58 %
Platelet Count: 239 10*3/uL (ref 150–400)
RBC: 3.47 MIL/uL — ABNORMAL LOW (ref 3.87–5.11)
RDW: 13.3 % (ref 11.5–15.5)
WBC Count: 10.8 10*3/uL — ABNORMAL HIGH (ref 4.0–10.5)
nRBC: 0 % (ref 0.0–0.2)

## 2020-11-07 ENCOUNTER — Inpatient Hospital Stay: Payer: PPO

## 2020-11-07 ENCOUNTER — Other Ambulatory Visit: Payer: Self-pay

## 2020-11-07 VITALS — BP 121/63 | HR 67 | Temp 98.4°F | Resp 17

## 2020-11-07 DIAGNOSIS — C50212 Malignant neoplasm of upper-inner quadrant of left female breast: Secondary | ICD-10-CM

## 2020-11-07 DIAGNOSIS — Z170 Estrogen receptor positive status [ER+]: Secondary | ICD-10-CM

## 2020-11-07 DIAGNOSIS — M8580 Other specified disorders of bone density and structure, unspecified site: Secondary | ICD-10-CM | POA: Diagnosis not present

## 2020-11-07 MED ORDER — ZOLEDRONIC ACID 4 MG/100ML IV SOLN
4.0000 mg | Freq: Once | INTRAVENOUS | Status: AC
  Administered 2020-11-07: 4 mg via INTRAVENOUS
  Filled 2020-11-07: qty 100

## 2020-11-07 MED ORDER — SODIUM CHLORIDE 0.9 % IV SOLN
Freq: Once | INTRAVENOUS | Status: AC
Start: 2020-11-07 — End: 2020-11-07
  Filled 2020-11-07: qty 250

## 2020-11-07 NOTE — Patient Instructions (Signed)
[facility]  Discharge Instructions: Thank you for choosing [facility] to provide your oncology and hematology care.   If you have a lab appointment with the [facility], please go directly to the [facility] and check in at the registration area.   Wear comfortable clothing and clothing appropriate for easy access to any Portacath or PICC line.   We strive to give you quality time with your provider. You may need to reschedule your appointment if you arrive late (15 or more minutes).  Arriving late affects you and other patients whose appointments are after yours.  Also, if you miss three or more appointments without notifying the office, you may be dismissed from the clinic at the provider's discretion.      For prescription refill requests, have your pharmacy contact our office and allow 72 hours for refills to be completed.    Today you received the following chemotherapy and/or immunotherapy agents Zometa      To help prevent nausea and vomiting after your treatment, we encourage you to take your nausea medication as directed.  BELOW ARE SYMPTOMS THAT SHOULD BE REPORTED IMMEDIATELY: *FEVER GREATER THAN 100.4 F (38 C) OR HIGHER *CHILLS OR SWEATING *NAUSEA AND VOMITING THAT IS NOT CONTROLLED WITH YOUR NAUSEA MEDICATION *UNUSUAL SHORTNESS OF BREATH *UNUSUAL BRUISING OR BLEEDING *URINARY PROBLEMS (pain or burning when urinating, or frequent urination) *BOWEL PROBLEMS (unusual diarrhea, constipation, pain near the anus) TENDERNESS IN MOUTH AND THROAT WITH OR WITHOUT PRESENCE OF ULCERS (sore throat, sores in mouth, or a toothache) UNUSUAL RASH, SWELLING OR PAIN  UNUSUAL VAGINAL DISCHARGE OR ITCHING   Items with * indicate a potential emergency and should be followed up as soon as possible or go to the Emergency Department if any problems should occur.  Please show the CHEMOTHERAPY ALERT CARD or IMMUNOTHERAPY ALERT CARD at check-in to the  Emergency Department and triage nurse.  Should you have questions after your visit or need to cancel or reschedule your appointment, please contact [facility]  Dept: [phone]  and follow the prompts.  Office hours are 8:00 a.m. to 4:30 p.m. Monday - Friday. Please note that voicemails left after 4:00 p.m. may not be returned until the following business day.  We are closed weekends and major holidays. You have access to a nurse at all times for urgent questions. Please call the main number to the clinic Dept: [phone] and follow the prompts.   For any non-urgent questions, you may also contact your provider using MyChart. We now offer e-Visits for anyone 82 and older to request care online for non-urgent symptoms. For details visit [email or web address].   Also download the MyChart app! Go to the app store, search "MyChart", open the app, select [facility], and log in with your MyChart username and password.  Due to Covid, a mask is required upon entering the hospital/clinic. If you do not have a mask, one will be given to you upon arrival. For doctor visits, patients may have 1 support person aged 32 or older with them. For treatment visits, patients cannot have anyone with them due to current Covid guidelines and our immunocompromised population.   Zoledronic Acid Injection (Hypercalcemia, Oncology) What is this medication? ZOLEDRONIC ACID (ZOE le dron ik AS id) slows calcium loss from bones. It high calcium levels in the blood from some kinds of cancer. It may be used in otherpeople at risk for bone loss. This medicine may be  used for other purposes; ask your health care provider orpharmacist if you have questions. COMMON BRAND NAME(S): Zometa What should I tell my care team before I take this medication? They need to know if you have any of these conditions: cancer dehydration dental disease kidney disease liver disease low levels of calcium in the  blood lung or breathing disease (asthma) receiving steroids like dexamethasone or prednisone an unusual or allergic reaction to zoledronic acid, other medicines, foods, dyes, or preservatives pregnant or trying to get pregnant breast-feeding How should I use this medication? This drug is injected into a vein. It is given by a health care provider in [facility] or clinic setting. Talk to your health care provider about the use of this drug in children.Special care may be needed. Overdosage: If you think you have taken too much of this medicine contact apoison control center or emergency room at once. NOTE: This medicine is only for you. Do not share this medicine with others. What if I miss a dose? Keep appointments for follow-up doses. It is important not to miss your dose.Call your health care provider if you are unable to keep an appointment. What may interact with this medication? certain antibiotics given by injection NSAIDs, medicines for pain and inflammation, like ibuprofen or naproxen some diuretics like bumetanide, furosemide teriparatide thalidomide This list may not describe all possible interactions. Give your health care provider a list of all the medicines, herbs, non-prescription drugs, or dietary supplements you use. Also tell them if you smoke, drink alcohol, or use illegaldrugs. Some items may interact with your medicine. What should I watch for while using this medication? Visit your health care provider for regular checks on your progress. It may besome time before you see the benefit from this drug. Some people who take this drug have severe bone, joint, or muscle pain. This drug may also increase your risk for jaw problems or a broken thigh bone. Tell your health care provider right away if you have severe pain in your jaw, bones, joints, or muscles. Tell you health care provider if you have any painthat does not go away or that gets worse. Tell your dentist and dental  surgeon that you are taking this drug. You should not have major dental surgery while on this drug. See your dentist to have a dental exam and fix any dental problems before starting this drug. Take good care of your teeth while on this drug. Make sure you see your dentist forregular follow-up appointments. You should make sure you get enough calcium and vitamin D while you are taking this drug. Discuss the foods you eat and the vitamins you take with your healthcare provider. Check with your health care provider if you have severe diarrhea, nausea, and vomiting, or if you sweat a lot. The loss of too much body fluid may make itdangerous for you to take this drug. You may need blood work done while you are taking this drug. Do not become pregnant while taking this drug. Women should inform their health care provider if they wish to become pregnant or think they might be pregnant. There is potential for serious harm to an unborn child. Talk to your healthcare provider for more information. What side effects may I notice from receiving this medication? Side effects that you should report to your doctor or health care provider assoon as possible: allergic reactions (skin rash, itching or hives; swelling of the face, lips, or tongue) bone pain infection (fever, chills, cough, sore  throat, pain or trouble passing urine) jaw pain, especially after dental work joint pain kidney injury (trouble passing urine or change in the amount of urine) low blood pressure (dizziness; feeling faint or lightheaded, falls; unusually weak or tired) low calcium levels (fast heartbeat; muscle cramps or pain; pain, tingling, or numbness in the hands or feet; seizures) low magnesium levels (fast, irregular heartbeat; muscle cramp or pain; muscle weakness; tremors; seizures) low red blood cell counts (trouble breathing; feeling faint; lightheaded, falls; unusually weak or tired) muscle pain redness, blistering, peeling, or  loosening of the skin, including inside the mouth severe diarrhea swelling of the ankles, feet, hands trouble breathing Side effects that usually do not require medical attention (report to yourdoctor or health care provider if they continue or are bothersome): anxious constipation coughing depressed mood eye irritation, itching, or pain fever general ill feeling or flu-like symptoms nausea pain, redness, or irritation at site where injected trouble sleeping This list may not describe all possible side effects. Call your doctor for medical advice about side effects. You may report side effects to FDA at1-800-FDA-1088. Where should I keep my medication? This drug is given in a hospital or clinic. It will not be stored at home. NOTE: This sheet is a summary. It may not cover all possible information. If you have questions about this medicine, talk to your doctor, pharmacist, orhealth care provider.  2022 [employer] (2018-12-30 09:13:00)

## 2020-11-14 DIAGNOSIS — S62202A Unspecified fracture of first metacarpal bone, left hand, initial encounter for closed fracture: Secondary | ICD-10-CM | POA: Diagnosis not present

## 2020-11-22 DIAGNOSIS — S62202A Unspecified fracture of first metacarpal bone, left hand, initial encounter for closed fracture: Secondary | ICD-10-CM | POA: Diagnosis not present

## 2020-11-28 DIAGNOSIS — G894 Chronic pain syndrome: Secondary | ICD-10-CM | POA: Diagnosis not present

## 2020-11-28 DIAGNOSIS — M5442 Lumbago with sciatica, left side: Secondary | ICD-10-CM | POA: Diagnosis not present

## 2020-11-28 DIAGNOSIS — M5441 Lumbago with sciatica, right side: Secondary | ICD-10-CM | POA: Diagnosis not present

## 2020-11-28 DIAGNOSIS — M961 Postlaminectomy syndrome, not elsewhere classified: Secondary | ICD-10-CM | POA: Diagnosis not present

## 2020-11-28 DIAGNOSIS — M533 Sacrococcygeal disorders, not elsewhere classified: Secondary | ICD-10-CM | POA: Diagnosis not present

## 2020-11-28 DIAGNOSIS — G8929 Other chronic pain: Secondary | ICD-10-CM | POA: Diagnosis not present

## 2020-11-28 DIAGNOSIS — Z9689 Presence of other specified functional implants: Secondary | ICD-10-CM | POA: Diagnosis not present

## 2020-11-28 DIAGNOSIS — M545 Low back pain, unspecified: Secondary | ICD-10-CM | POA: Diagnosis not present

## 2020-11-28 DIAGNOSIS — F1190 Opioid use, unspecified, uncomplicated: Secondary | ICD-10-CM | POA: Diagnosis not present

## 2020-11-28 DIAGNOSIS — K5903 Drug induced constipation: Secondary | ICD-10-CM | POA: Diagnosis not present

## 2020-11-28 DIAGNOSIS — M62838 Other muscle spasm: Secondary | ICD-10-CM | POA: Diagnosis not present

## 2020-12-06 DIAGNOSIS — S62202A Unspecified fracture of first metacarpal bone, left hand, initial encounter for closed fracture: Secondary | ICD-10-CM | POA: Diagnosis not present

## 2020-12-13 DIAGNOSIS — N183 Chronic kidney disease, stage 3 unspecified: Secondary | ICD-10-CM | POA: Diagnosis not present

## 2020-12-13 DIAGNOSIS — E782 Mixed hyperlipidemia: Secondary | ICD-10-CM | POA: Diagnosis not present

## 2020-12-13 DIAGNOSIS — Z23 Encounter for immunization: Secondary | ICD-10-CM | POA: Diagnosis not present

## 2020-12-13 DIAGNOSIS — I129 Hypertensive chronic kidney disease with stage 1 through stage 4 chronic kidney disease, or unspecified chronic kidney disease: Secondary | ICD-10-CM | POA: Diagnosis not present

## 2020-12-13 DIAGNOSIS — M810 Age-related osteoporosis without current pathological fracture: Secondary | ICD-10-CM | POA: Diagnosis not present

## 2020-12-27 DIAGNOSIS — S62202A Unspecified fracture of first metacarpal bone, left hand, initial encounter for closed fracture: Secondary | ICD-10-CM | POA: Diagnosis not present

## 2021-01-16 DIAGNOSIS — S62202A Unspecified fracture of first metacarpal bone, left hand, initial encounter for closed fracture: Secondary | ICD-10-CM | POA: Diagnosis not present

## 2021-02-14 DIAGNOSIS — F1190 Opioid use, unspecified, uncomplicated: Secondary | ICD-10-CM | POA: Diagnosis not present

## 2021-02-14 DIAGNOSIS — G8929 Other chronic pain: Secondary | ICD-10-CM | POA: Diagnosis not present

## 2021-02-14 DIAGNOSIS — M7062 Trochanteric bursitis, left hip: Secondary | ICD-10-CM | POA: Diagnosis not present

## 2021-02-14 DIAGNOSIS — M5441 Lumbago with sciatica, right side: Secondary | ICD-10-CM | POA: Diagnosis not present

## 2021-02-14 DIAGNOSIS — M7061 Trochanteric bursitis, right hip: Secondary | ICD-10-CM | POA: Diagnosis not present

## 2021-02-14 DIAGNOSIS — M961 Postlaminectomy syndrome, not elsewhere classified: Secondary | ICD-10-CM | POA: Diagnosis not present

## 2021-02-14 DIAGNOSIS — Z5181 Encounter for therapeutic drug level monitoring: Secondary | ICD-10-CM | POA: Diagnosis not present

## 2021-02-14 DIAGNOSIS — M62838 Other muscle spasm: Secondary | ICD-10-CM | POA: Diagnosis not present

## 2021-02-14 DIAGNOSIS — M545 Low back pain, unspecified: Secondary | ICD-10-CM | POA: Diagnosis not present

## 2021-02-14 DIAGNOSIS — M533 Sacrococcygeal disorders, not elsewhere classified: Secondary | ICD-10-CM | POA: Diagnosis not present

## 2021-02-14 DIAGNOSIS — K5903 Drug induced constipation: Secondary | ICD-10-CM | POA: Diagnosis not present

## 2021-02-14 DIAGNOSIS — Z9689 Presence of other specified functional implants: Secondary | ICD-10-CM | POA: Diagnosis not present

## 2021-02-14 DIAGNOSIS — M5442 Lumbago with sciatica, left side: Secondary | ICD-10-CM | POA: Diagnosis not present

## 2021-02-14 DIAGNOSIS — Z79899 Other long term (current) drug therapy: Secondary | ICD-10-CM | POA: Diagnosis not present

## 2021-02-18 DIAGNOSIS — H3554 Dystrophies primarily involving the retinal pigment epithelium: Secondary | ICD-10-CM | POA: Diagnosis not present

## 2021-03-06 DIAGNOSIS — M7062 Trochanteric bursitis, left hip: Secondary | ICD-10-CM | POA: Diagnosis not present

## 2021-03-06 DIAGNOSIS — M7061 Trochanteric bursitis, right hip: Secondary | ICD-10-CM | POA: Diagnosis not present

## 2021-03-12 DIAGNOSIS — C50919 Malignant neoplasm of unspecified site of unspecified female breast: Secondary | ICD-10-CM | POA: Diagnosis not present

## 2021-03-12 DIAGNOSIS — N952 Postmenopausal atrophic vaginitis: Secondary | ICD-10-CM | POA: Diagnosis not present

## 2021-03-12 DIAGNOSIS — Z0000 Encounter for general adult medical examination without abnormal findings: Secondary | ICD-10-CM | POA: Diagnosis not present

## 2021-03-12 DIAGNOSIS — N183 Chronic kidney disease, stage 3 unspecified: Secondary | ICD-10-CM | POA: Diagnosis not present

## 2021-03-12 DIAGNOSIS — M810 Age-related osteoporosis without current pathological fracture: Secondary | ICD-10-CM | POA: Diagnosis not present

## 2021-03-12 DIAGNOSIS — I341 Nonrheumatic mitral (valve) prolapse: Secondary | ICD-10-CM | POA: Diagnosis not present

## 2021-03-12 DIAGNOSIS — M797 Fibromyalgia: Secondary | ICD-10-CM | POA: Diagnosis not present

## 2021-03-12 DIAGNOSIS — E782 Mixed hyperlipidemia: Secondary | ICD-10-CM | POA: Diagnosis not present

## 2021-03-12 DIAGNOSIS — K219 Gastro-esophageal reflux disease without esophagitis: Secondary | ICD-10-CM | POA: Diagnosis not present

## 2021-03-12 DIAGNOSIS — I129 Hypertensive chronic kidney disease with stage 1 through stage 4 chronic kidney disease, or unspecified chronic kidney disease: Secondary | ICD-10-CM | POA: Diagnosis not present

## 2021-03-12 DIAGNOSIS — F3341 Major depressive disorder, recurrent, in partial remission: Secondary | ICD-10-CM | POA: Diagnosis not present

## 2021-03-12 DIAGNOSIS — G9050 Complex regional pain syndrome I, unspecified: Secondary | ICD-10-CM | POA: Diagnosis not present

## 2021-04-10 DIAGNOSIS — F3181 Bipolar II disorder: Secondary | ICD-10-CM | POA: Diagnosis not present

## 2021-04-10 DIAGNOSIS — R945 Abnormal results of liver function studies: Secondary | ICD-10-CM | POA: Diagnosis not present

## 2021-05-01 DIAGNOSIS — M961 Postlaminectomy syndrome, not elsewhere classified: Secondary | ICD-10-CM | POA: Diagnosis not present

## 2021-05-01 DIAGNOSIS — M5442 Lumbago with sciatica, left side: Secondary | ICD-10-CM | POA: Diagnosis not present

## 2021-05-01 DIAGNOSIS — Z9689 Presence of other specified functional implants: Secondary | ICD-10-CM | POA: Diagnosis not present

## 2021-05-01 DIAGNOSIS — M533 Sacrococcygeal disorders, not elsewhere classified: Secondary | ICD-10-CM | POA: Diagnosis not present

## 2021-05-01 DIAGNOSIS — M545 Low back pain, unspecified: Secondary | ICD-10-CM | POA: Diagnosis not present

## 2021-05-01 DIAGNOSIS — F1190 Opioid use, unspecified, uncomplicated: Secondary | ICD-10-CM | POA: Diagnosis not present

## 2021-05-01 DIAGNOSIS — M5441 Lumbago with sciatica, right side: Secondary | ICD-10-CM | POA: Diagnosis not present

## 2021-05-01 DIAGNOSIS — M62838 Other muscle spasm: Secondary | ICD-10-CM | POA: Diagnosis not present

## 2021-05-01 DIAGNOSIS — G8929 Other chronic pain: Secondary | ICD-10-CM | POA: Diagnosis not present

## 2021-05-01 DIAGNOSIS — K5903 Drug induced constipation: Secondary | ICD-10-CM | POA: Diagnosis not present

## 2021-05-01 DIAGNOSIS — M7061 Trochanteric bursitis, right hip: Secondary | ICD-10-CM | POA: Diagnosis not present

## 2021-05-01 DIAGNOSIS — M7062 Trochanteric bursitis, left hip: Secondary | ICD-10-CM | POA: Diagnosis not present

## 2021-05-08 DIAGNOSIS — R110 Nausea: Secondary | ICD-10-CM | POA: Diagnosis not present

## 2021-05-08 DIAGNOSIS — G9050 Complex regional pain syndrome I, unspecified: Secondary | ICD-10-CM | POA: Diagnosis not present

## 2021-05-08 DIAGNOSIS — I129 Hypertensive chronic kidney disease with stage 1 through stage 4 chronic kidney disease, or unspecified chronic kidney disease: Secondary | ICD-10-CM | POA: Diagnosis not present

## 2021-05-08 DIAGNOSIS — F339 Major depressive disorder, recurrent, unspecified: Secondary | ICD-10-CM | POA: Diagnosis not present

## 2021-05-08 DIAGNOSIS — R42 Dizziness and giddiness: Secondary | ICD-10-CM | POA: Diagnosis not present

## 2021-05-21 ENCOUNTER — Telehealth: Payer: Self-pay | Admitting: Hematology

## 2021-05-21 NOTE — Telephone Encounter (Signed)
Sch per 2/20 inbasket, attempted to r/s per Dr.feng request, unable to reach pt. Pt left scheduled as is.

## 2021-05-22 ENCOUNTER — Other Ambulatory Visit: Payer: Self-pay

## 2021-05-22 ENCOUNTER — Inpatient Hospital Stay (HOSPITAL_BASED_OUTPATIENT_CLINIC_OR_DEPARTMENT_OTHER): Payer: PPO | Admitting: Hematology

## 2021-05-22 ENCOUNTER — Inpatient Hospital Stay: Payer: PPO | Attending: Hematology

## 2021-05-22 ENCOUNTER — Encounter: Payer: Self-pay | Admitting: Hematology

## 2021-05-22 ENCOUNTER — Inpatient Hospital Stay: Payer: PPO

## 2021-05-22 VITALS — BP 138/65 | HR 79 | Temp 98.3°F | Resp 18 | Ht 63.0 in | Wt 125.9 lb

## 2021-05-22 DIAGNOSIS — R590 Localized enlarged lymph nodes: Secondary | ICD-10-CM | POA: Insufficient documentation

## 2021-05-22 DIAGNOSIS — Z170 Estrogen receptor positive status [ER+]: Secondary | ICD-10-CM | POA: Insufficient documentation

## 2021-05-22 DIAGNOSIS — Z9013 Acquired absence of bilateral breasts and nipples: Secondary | ICD-10-CM | POA: Diagnosis not present

## 2021-05-22 DIAGNOSIS — D638 Anemia in other chronic diseases classified elsewhere: Secondary | ICD-10-CM | POA: Diagnosis not present

## 2021-05-22 DIAGNOSIS — M1990 Unspecified osteoarthritis, unspecified site: Secondary | ICD-10-CM | POA: Insufficient documentation

## 2021-05-22 DIAGNOSIS — I10 Essential (primary) hypertension: Secondary | ICD-10-CM | POA: Insufficient documentation

## 2021-05-22 DIAGNOSIS — C50411 Malignant neoplasm of upper-outer quadrant of right female breast: Secondary | ICD-10-CM | POA: Diagnosis not present

## 2021-05-22 DIAGNOSIS — Z79811 Long term (current) use of aromatase inhibitors: Secondary | ICD-10-CM | POA: Insufficient documentation

## 2021-05-22 DIAGNOSIS — G8929 Other chronic pain: Secondary | ICD-10-CM | POA: Diagnosis not present

## 2021-05-22 DIAGNOSIS — M549 Dorsalgia, unspecified: Secondary | ICD-10-CM | POA: Diagnosis not present

## 2021-05-22 DIAGNOSIS — M810 Age-related osteoporosis without current pathological fracture: Secondary | ICD-10-CM | POA: Diagnosis not present

## 2021-05-22 DIAGNOSIS — Z79899 Other long term (current) drug therapy: Secondary | ICD-10-CM | POA: Insufficient documentation

## 2021-05-22 DIAGNOSIS — C50212 Malignant neoplasm of upper-inner quadrant of left female breast: Secondary | ICD-10-CM | POA: Diagnosis not present

## 2021-05-22 DIAGNOSIS — Z86718 Personal history of other venous thrombosis and embolism: Secondary | ICD-10-CM | POA: Insufficient documentation

## 2021-05-22 DIAGNOSIS — C50412 Malignant neoplasm of upper-outer quadrant of left female breast: Secondary | ICD-10-CM | POA: Diagnosis not present

## 2021-05-22 LAB — IRON AND IRON BINDING CAPACITY (CC-WL,HP ONLY)
Iron: 100 ug/dL (ref 28–170)
Saturation Ratios: 34 % — ABNORMAL HIGH (ref 10.4–31.8)
TIBC: 293 ug/dL (ref 250–450)
UIBC: 193 ug/dL (ref 148–442)

## 2021-05-22 LAB — CBC WITH DIFFERENTIAL (CANCER CENTER ONLY)
Abs Immature Granulocytes: 0.01 10*3/uL (ref 0.00–0.07)
Basophils Absolute: 0.1 10*3/uL (ref 0.0–0.1)
Basophils Relative: 1 %
Eosinophils Absolute: 0.3 10*3/uL (ref 0.0–0.5)
Eosinophils Relative: 4 %
HCT: 35.5 % — ABNORMAL LOW (ref 36.0–46.0)
Hemoglobin: 11.5 g/dL — ABNORMAL LOW (ref 12.0–15.0)
Immature Granulocytes: 0 %
Lymphocytes Relative: 19 %
Lymphs Abs: 1.5 10*3/uL (ref 0.7–4.0)
MCH: 31.3 pg (ref 26.0–34.0)
MCHC: 32.4 g/dL (ref 30.0–36.0)
MCV: 96.5 fL (ref 80.0–100.0)
Monocytes Absolute: 0.6 10*3/uL (ref 0.1–1.0)
Monocytes Relative: 7 %
Neutro Abs: 5.5 10*3/uL (ref 1.7–7.7)
Neutrophils Relative %: 69 %
Platelet Count: 165 10*3/uL (ref 150–400)
RBC: 3.68 MIL/uL — ABNORMAL LOW (ref 3.87–5.11)
RDW: 12.8 % (ref 11.5–15.5)
WBC Count: 8 10*3/uL (ref 4.0–10.5)
nRBC: 0 % (ref 0.0–0.2)

## 2021-05-22 LAB — CMP (CANCER CENTER ONLY)
ALT: 32 U/L (ref 0–44)
AST: 33 U/L (ref 15–41)
Albumin: 4 g/dL (ref 3.5–5.0)
Alkaline Phosphatase: 50 U/L (ref 38–126)
Anion gap: 3 — ABNORMAL LOW (ref 5–15)
BUN: 22 mg/dL (ref 8–23)
CO2: 30 mmol/L (ref 22–32)
Calcium: 9.9 mg/dL (ref 8.9–10.3)
Chloride: 109 mmol/L (ref 98–111)
Creatinine: 1.37 mg/dL — ABNORMAL HIGH (ref 0.44–1.00)
GFR, Estimated: 41 mL/min — ABNORMAL LOW (ref >60)
Glucose, Bld: 90 mg/dL (ref 70–99)
Potassium: 4.3 mmol/L (ref 3.5–5.1)
Sodium: 142 mmol/L (ref 135–145)
Total Bilirubin: 0.3 mg/dL (ref 0.3–1.2)
Total Protein: 6.3 g/dL — ABNORMAL LOW (ref 6.5–8.1)

## 2021-05-22 LAB — FERRITIN: Ferritin: 138 ng/mL (ref 11–307)

## 2021-05-22 MED ORDER — ZOLEDRONIC ACID 4 MG/100ML IV SOLN
4.0000 mg | Freq: Once | INTRAVENOUS | Status: AC
  Administered 2021-05-22: 4 mg via INTRAVENOUS
  Filled 2021-05-22: qty 100

## 2021-05-22 MED ORDER — SODIUM CHLORIDE 0.9 % IV SOLN: Freq: Once | INTRAVENOUS | Status: AC

## 2021-05-22 NOTE — Patient Instructions (Signed)

## 2021-05-22 NOTE — Progress Notes (Signed)
[place]   [phone]) [phone] [phone]) [phone]   Clinic Follow up Note   Patient Care Team: Mayra Neer, MD as PCP - General (Family Medicine) Holley Bouche, NP (Inactive) as Nurse Practitioner (Nurse Practitioner) [name], MD as Consulting Physician (General Surgery) Truitt Merle, MD as Consulting Physician (Hematology) Sylvan Cheese, NP as Nurse Practitioner (Hematology and Oncology) Crissie Reese, MD as Consulting Physician (Plastic Surgery)  Date of Service:  05/22/2021  CHIEF COMPLAINT: f/u of bilateral breast cancer  CURRENT THERAPY:  Anastrozole 1 mg daily, started on 09/28/2015  ASSESSMENT & PLAN:  Connie Bennett is a 71 y.o. female with   1. Breast cancer of upper inner quadrant of left breast,  Invasive ductal carcinoma, pT2N0M0, stage IIA, G2, ER+/PR-/HER2-, ONCOTYPE RS 28  -She was diagnosed in 05/2015. She is s/p left breast mastectomy.  -Oncotype RS showed moderate risk, chemo ultimately not pursued. -She started anti-estrogen therapy with exemestane in 07/2015, but stopped after 1 month use due to skin rash. She started anastrozole in 08/2015, tolerating well, plan for 7 years.  -we discussed switching anastrozole to tamoxifen due to her severe arthralgia, due to her previously thrombosis, we decided not to switch. We will continue anastrozole for 2 more years. -she is clinically doing very well, from a breast cancer standpoint. Labs reviewed, overall stable. Physical exam was unremarkable.   2. Extensive right breast DCIS, >10cm, high grade ER/PR negative -She was diagnosed in 07/2014. She is s/p right lumpectomy and multiple re-excision which resulted in her ultimately having a right breast mastectomy which cured her.    3. Osteoporosis  -Her 11/2017 DEXA shows Osteopenia with lowest T-score -2.2 at left femur neck. Repeat in February 2022 showed worsening with T score -2.9 at the left femur neck, she has osteoporosis  now -she began Zometa q67months on 11/07/20.   4. Arthritis, chronic back pain, hypertension -She will continue follow-up with her primary care physician Dr Brigitte Pulse every 6 weeks.  -Stable    5. Anemia -Possibly anemia of chronic disease secondary to CKD -Her iron study, folic acid, and T34 levels were normal in 11/2015. -hgb has been stable in 10-11 range. Iron panel today is pending (05/22/21).  -Continue oral iron daily.    6. Left cervical enlarged node  -likely reactive -she will monitor it,she knows to call me if it gets bigger or does not resolve in 2-3 months      Plan -proceed with IV Zometa infusion today -Continue anastrozole -Lab and follow-up with lacie in 6 months with Zometa infusion   No problem-specific Assessment & Plan notes found for this encounter.   SUMMARY OF ONCOLOGIC HISTORY: Oncology History Overview Note  Breast cancer of upper-outer quadrant of RIGHT female breast ([place])   Staging form: Breast, AJCC 7th Edition Clinical stage from 09/06/2014: Stage 0 (Tis (DCIS), N0, M0)  Pathologic stage from 01/25/2015: Stage 0 (Tis (DCIS), N0, cM0)  ------  Breast cancer of upper-inner quadrant of LEFT female breast ([place])   Staging form: Breast, AJCC 7th Edition Clinical stage from 06/20/2015: Stage IA (T1c, N0, M0)      Pathologic stage from 07/05/2015: Stage IIA (T2, N0, cM0)    Breast cancer of upper-outer quadrant of right female breast ([place])  08/23/2014 Mammogram   Mammogram and ultrasound showed a 3cm cystic lesion at 8:30 of right breast, and a 7 mm and a 5 mm increased density lesion at 10:00 of the right breast.   08/29/2014 Initial Biopsy  Right breast core needle bx x 2: high-grade DCIS, with focus of mildly suspicious for early stromal invasion. ER- (0%), PR- (0%).    08/29/2014 Clinical Stage   Stage 0: Tis N0   09/13/2014 Surgery   Right lumpectomy/SLNB ([place]): High grade DCIS with necrosis and calcfications, positive margins. 5 LN removed and  negative for malignancy (0/5). Grade 3.   09/13/2014 Pathologic Stage   Stage 0: Tis N0   09/29/2014 Surgery   Re-excision for positive margins; multiple breast margins re-excised, high-grade DCIS, measuring 1.0 cm, 3.0 cm, 2.0 cm, some margins are still positive or very close.    01/25/2015 Definitive Surgery   Right nipple-sparing mastectomy [facility]): DCIS with calcifications, high grade, spans 9 cm, LCIS, fibrocystic changes with adenosis and calcifications, negative margins. 1 LN removed and negative. Imeediate reconstruction   03/23/2015 Survivorship   Survivorship care plan completed and mailed to patient in lieu of in person visit at request of patient.   Breast cancer of upper-inner quadrant of left female breast ([place])  06/28/2013 Imaging   DEXA scan: Osteopenia [name])   06/19/2015 Mammogram   Diagnostic mammogram and ultrasound showed a 1.3 cm in the upper inner quadrant of left breast, and a 0.9 mm simple cyst. Axilla was negative.   06/20/2015 Receptors her2   ER 100% positive, PR negative, Ki-67 30%, HER-2 negative   06/20/2015 Initial Biopsy   The left breast upper inner quadrant mass biopsy showed invasive ductal carcinoma, grade 2   06/20/2015 Initial Diagnosis   Breast cancer of upper-inner quadrant of left female breast ([place])   07/05/2015 Surgery   Left breast nipple-sparing mastectomy and sentinel lymph node biopsy with immediate reconstruction [facility]); reconstruction with Dr. Harlow Mares at [facility]   07/05/2015 Pathology Results   Left mastectomy: Invasive ductal carcinoma with extracellular mucin, grade 2, spanning 2.0 cm, margins were negative, lobular carcinoma in situ, 3 sentinel lymph nodes negative. PR repeated and remains negative. HER2 remains neg (ratio 1.21)   07/05/2015 Pathologic Stage   pT2, pN0: Stage IIA    07/05/2015 Oncotype testing   RS 28 (intermediate risk), which predicts 10 year risk of distant recurrence 18% with tamoxifen alone   07/30/2015 -  08/30/2015 Anti-estrogen oral therapy   Adjuvant exemestane 25 mg once daily, stopped after one month due to skin rash.    09/01/2015 -  Anti-estrogen oral therapy   Switched to anastrozole 20m daily.  Planned duration of treatment : 5-10 years ([facility]      INTERVAL HISTORY:  KTORYN DEWALTis here for a follow up of breast cancer. She was last seen by me on 10/24/20. She presents to the clinic alone. She reports she has had some "dizzy spells" lately. She states she went to her PCP and is being treated for inner ear issues. She also reports a palpable bump to her left neck that concerns her. She notes her PCP did not think it was concerning.   All other systems were reviewed with the patient and are negative.  MEDICAL HISTORY:  Past Medical History:  Diagnosis Date   Anxiety    takes Xanax daily as needed   Arthritis    Bilateral lower extremity edema    takes Furosemide daily as needed   Breast cancer ([facility]-  dr YTruitt Merle-  right upper-outer quadrant    dx May 2016--- Stage 0  (Tis,N0,M0)  DCIS  Right breast---  09-13-2014  s/p  radioactive seed/ partial mastectomy / sln bx  Chronic fatigue syndrome    Chronic low back pain    Complication of anesthesia    spinal cord stimulator- to be off for surgery   Depression    takes Effexor daily   Family history of adverse reaction to anesthesia    mom hard to wake up   Fibromyalgia    Headache(784.0)    couple of times a week   Heart murmur    History of bronchitis > 46yrs ago   History of colon polyps    benign   History of drug overdose    oxycontin  and oxycodone  Sept 2015--  now has narcan injection prescription   History of DVT of lower extremity    2008--  BILATERAL   History of hiatal hernia    HTN (hypertension)    takes Amlodipine and Ramipril daily   Hyperlipidemia    takes Zetia and Crestor  daily   Joint pain    Mitral valve prolapse    MILD /   PER PT ASYMPTOMATIC   Muscle spasm    takes Zanaflex  daily as needed   Osteoporosis    Peripheral neuropathy    takes Neurontin daily   Peripheral vascular disease (HCC)    hx dvt's   RSD (reflex sympathetic dystrophy)    left wrist and forearm from a fx   RSD (reflex sympathetic dystrophy)    Sjogren's disease ([place])    Vertigo    takes Meclizine daily     SURGICAL HISTORY: Past Surgical History:  Procedure Laterality Date   BACK SURGERY     BREAST RECONSTRUCTION WITH PLACEMENT OF TISSUE EXPANDER AND FLEX HD (ACELLULAR HYDRATED DERMIS) Right 01/25/2015   Procedure: RIGHT BREAST RECONSTRUCTION WITH PLACEMENT OF IMPLANT AND ACELLULAR DERMAL MATRIX ;  Surgeon: Crissie Reese, MD;  Location: [place];  Service: Plastics;  Laterality: Right;   BREAST RECONSTRUCTION WITH PLACEMENT OF TISSUE EXPANDER AND FLEX HD (ACELLULAR HYDRATED DERMIS) Left 07/05/2015   Procedure: LEFT BREAST RECONSTRUCTION WITH PLACEMENT OF TISSUE EXPANDER AND  ACELLULAR DERMAL MATRIX ;  Surgeon: Crissie Reese, MD;  Location: [place];  Service: Plastics;  Laterality: Left;   COLONOSCOPY     ESOPHAGOGASTRODUODENOSCOPY     eye lid surgery Bilateral    FOOT SURGERY Bilateral 1996   [facility]ORTON'S NEUROMA   INNER EAR SURGERY Right    LUMBAR [place] SURGERY     MASTECTOMY Right 01/25/2015   nipple sparing    NASAL SINUS SURGERY     NIPPLE SPARING MASTECTOMY/SENTINAL LYMPH NODE BIOPSY/RECONSTRUCTION/PLACEMENT OF TISSUE EXPANDER Left 07/05/2015   Procedure: LEFT NIPPLE SPARING MASTECTOMY WITH SENTINAL LYMPH NODE BIOPSY ;  Surgeon: [name], MD;  Location: [place];  Service: General;  Laterality: Left;   OPEN REDUCTION INTERNAL FIXATION (ORIF) DISTAL RADIAL FRACTURE Right 08/09/2018   Procedure: OPEN REDUCTION INTERNAL FIXATION (ORIF) DISTAL RADIAL FRACTURE;  Surgeon: Milly Jakob, MD;  Location: [facility] [place];  Service: Orthopedics;  Laterality: Right;   ORIF LEFT WRIST FX'S/  LEFT CARPAL TUNNEL RELEASE  1999   POSTERIOR LUMBAR FUSION  01-30-2009   L4--5 Laminectmy w/  decompression/  bilateral L4--5 microdiskectomy and fusion   RADIOACTIVE SEED GUIDED PARTIAL MASTECTOMY WITH AXILLARY SENTINEL LYMPH NODE BIOPSY Right 09/13/2014   Procedure: RADIOACTIVE SEED GUIDED PARTIAL MASTECTOMY WITH AXILLARY SENTINEL LYMPH NODE BIOPSY;  Surgeon: [name], MD;  Location: [place];  Service: General;  Laterality: Right;   RE-EXCISION OF BREAST LUMPECTOMY Right 09/29/2014   Procedure: RE-EXCISION OF  BREAST LUMPECTOMY;  Surgeon: [name], MD;  Location: [facility];  Service: General;  Laterality: Right;   RIGHT CARPAL TUNNEL RELEASE/  TRIGGER RELEASE RIGHT MIDDLE FINGER  06-18-2006   RIGHT INFERIOR PARATHYROIDECTOMY  04-07-2006   SIMPLE MASTECTOMY WITH AXILLARY SENTINEL NODE BIOPSY Right 01/25/2015   Procedure: RIGHT NIPPLE SPARING MASTECTOMY;  Surgeon: [name], MD;  Location: [place];  Service: General;  Laterality: Right;   SPINAL CORD STIMULATOR IMPLANT  2013   TONSILLECTOMY  as child    I have reviewed the social history and family history with the patient and they are unchanged from previous note.  ALLERGIES:  is allergic to carbocaine [mepivacaine hcl], penicillins, sulfa antibiotics, amlodipine, and latex.  MEDICATIONS:  Current Outpatient Medications  Medication Sig Dispense Refill   ALPRAZolam (XANAX) 0.5 MG tablet Take 0.5-1 mg by mouth 2 (two) times daily as needed for anxiety. Take 1 tablet (0.5 mg) by mouth if needed during the day, and 2 tablets (1 mg) if needed at bedtime     anastrozole (ARIMIDEX) 1 MG tablet TAKE 1 TABLET BY MOUTH EVERY DAY 90 tablet 3   Ascorbic Acid (VITAMIN C) 1000 MG tablet Take 1,000 mg by mouth daily.     calcium carbonate (OS-CAL) 600 MG TABS tablet Take 600 mg by mouth daily with breakfast.     Cholecalciferol (VITAMIN D) 50 MCG (2000 UT) tablet Take 2,000 Units by mouth daily.     cyclobenzaprine (FLEXERIL) 10 MG tablet Take 10 mg by mouth 3 (three) times daily as needed for muscle spasms.      ezetimibe (ZETIA) 10 MG tablet Take 10 mg by mouth at bedtime.      Flaxseed, Linseed, (FLAX SEED OIL) 1000 MG CAPS Take by mouth.     furosemide (LASIX) 40 MG tablet Take 40 mg by mouth daily as needed for fluid or edema (leg swelling). Reported on 06/29/2015  1   gabapentin (NEURONTIN) 600 MG tablet Take 600 mg by mouth 3 (three) times daily.       Lactobacillus (PROBIOTIC ACIDOPHILUS PO) Take 1 tablet by mouth every morning.     Omega-3 Fatty Acids (FISH OIL) 1000 MG CAPS Take by mouth.     OVER THE COUNTER MEDICATION Take 1 drop by mouth daily as needed (dry eyes). OTC lubricating eye drops     oxyCODONE-acetaminophen (PERCOCET) 10-325 MG tablet Take 1 tablet by mouth every 4 (four) hours as needed for pain.     PROAIR HFA 108 (90 Base) MCG/ACT inhaler Inhale 2 puffs into the lungs every 4 (four) hours as needed.  2   ramipril (ALTACE) 10 MG capsule Take 10 mg by mouth daily.     rosuvastatin (CRESTOR) 20 MG tablet Take 20 mg by mouth daily.     tiZANidine (ZANAFLEX) 4 MG tablet Take 4 mg by mouth every 6 (six) hours as needed for muscle spasms.     venlafaxine (EFFEXOR-XR) 150 MG 24 hr capsule Take 150 mg by mouth 2 (two) times daily. Take 1 capsule (150 mg) by mouth daily with breakfast and lunch     No current facility-administered medications for this visit.    PHYSICAL EXAMINATION: ECOG PERFORMANCE STATUS: 0 - Asymptomatic  Vitals:   05/22/21 1321  BP: 138/65  Pulse: 79  Resp: 18  Temp: 98.3 F (36.8 C)  SpO2: 99%   Wt Readings from Last 3 Encounters:  05/22/21 125 lb 14.4 oz (57.1 kg)  10/24/20 126 lb (57.2 kg)  04/25/20 130 lb 6.4 oz (59.1 kg)     GENERAL:alert, no distress and comfortable SKIN: skin color, texture, turgor are normal, no rashes or significant lesions EYES: normal, Conjunctiva are pink and non-injected, sclera clear  NECK: supple, thyroid normal size, non-tender, without nodularity LYMPH: there is a 1.5cm palpable node in left lateral upper neck  (below ear),  no other palpable lymphadenopathy in the cervical, axillary  LUNGS: clear to auscultation and percussion with normal breathing effort HEART: regular rate & rhythm and no murmurs and no lower extremity edema ABDOMEN:abdomen soft, non-tender and normal bowel sounds Musculoskeletal:no cyanosis of digits and no clubbing  NEURO: alert & oriented x 3 with fluent speech, no focal motor/sensory deficits BREAST: No palpable mass, nodules or adenopathy bilaterally. Breast exam benign.   LABORATORY DATA:  I have reviewed the data as listed CBC Latest Ref Rng & Units 05/22/2021 11/02/2020 04/25/2020  WBC 4.0 - 10.5 K/uL 8.0 10.8(H) 8.9  Hemoglobin 12.0 - 15.0 g/dL 11.5(L) 11.0(L) 10.5(L)  Hematocrit 36.0 - 46.0 % 35.5(L) 32.7(L) 33.0(L)  Platelets 150 - 400 K/uL 165 239 196     CMP Latest Ref Rng & Units 05/22/2021 11/02/2020 04/25/2020  Glucose 70 - 99 mg/dL 90 86 89  BUN 8 - 23 mg/dL _0 Creatinine 0.44 - 1.00 mg/dL 1.37(H) 1.44(H) 1.33(H)  Sodium 135 - 145 mmol/L 142 140 143  Potassium 3.5 - 5.1 mmol/L 4.3 4.6 3.8  Chloride 98 - 111 mmol/L 109 105 107  CO2 22 - 32 mmol/L _1 Calcium 8.9 - 10.3 mg/dL 9.9 9.8 9.7  Total Protein 6.5 - 8.1 g/dL 6.3(L) 6.9 6.8  Total Bilirubin 0.3 - 1.2 mg/dL 0.3 0.3 <0.2(L)  Alkaline Phos 38 - 126 U/L 50 66 74  AST 15 - 41 U/L 33 33 25  ALT 0 - 44 U/L 32 46(H) 23      RADIOGRAPHIC STUDIES: I have personally reviewed the radiological images as listed and agreed with the findings in the report. No results found.    No orders of the defined types were placed in this encounter.  All questions were answered. The patient knows to call the clinic with any problems, questions or concerns. No barriers to learning was detected. The total time spent in the appointment was 30 minutes.     Truitt Merle, MD 05/22/2021   I, Wilburn Mylar, am acting as scribe for Truitt Merle, MD.   I have reviewed the above documentation for accuracy and  completeness, and I agree with the above.

## 2021-06-01 ENCOUNTER — Other Ambulatory Visit: Payer: Self-pay | Admitting: Hematology

## 2021-06-01 DIAGNOSIS — C50411 Malignant neoplasm of upper-outer quadrant of right female breast: Secondary | ICD-10-CM

## 2021-07-06 ENCOUNTER — Other Ambulatory Visit: Payer: Self-pay

## 2021-07-06 ENCOUNTER — Emergency Department (HOSPITAL_COMMUNITY): Payer: PPO

## 2021-07-06 ENCOUNTER — Encounter (HOSPITAL_COMMUNITY): Payer: Self-pay | Admitting: Emergency Medicine

## 2021-07-06 ENCOUNTER — Inpatient Hospital Stay (HOSPITAL_COMMUNITY)
Admission: EM | Admit: 2021-07-06 | Discharge: 2021-07-09 | DRG: 871 | Disposition: A | Discharge status: Home / Self Care | Payer: PPO | Attending: Internal Medicine | Admitting: Internal Medicine

## 2021-07-06 DIAGNOSIS — R410 Disorientation, unspecified: Principal | ICD-10-CM

## 2021-07-06 DIAGNOSIS — Z20822 Contact with and (suspected) exposure to covid-19: Secondary | ICD-10-CM | POA: Diagnosis present

## 2021-07-06 DIAGNOSIS — Z6822 Body mass index (BMI) 22.0-22.9, adult: Secondary | ICD-10-CM

## 2021-07-06 DIAGNOSIS — R197 Diarrhea, unspecified: Secondary | ICD-10-CM | POA: Diagnosis not present

## 2021-07-06 DIAGNOSIS — M797 Fibromyalgia: Secondary | ICD-10-CM | POA: Diagnosis present

## 2021-07-06 DIAGNOSIS — Z888 Allergy status to other drugs, medicaments and biological substances status: Secondary | ICD-10-CM | POA: Diagnosis not present

## 2021-07-06 DIAGNOSIS — Z9011 Acquired absence of right breast and nipple: Secondary | ICD-10-CM | POA: Diagnosis not present

## 2021-07-06 DIAGNOSIS — I341 Nonrheumatic mitral (valve) prolapse: Secondary | ICD-10-CM | POA: Diagnosis not present

## 2021-07-06 DIAGNOSIS — Z801 Family history of malignant neoplasm of trachea, bronchus and lung: Secondary | ICD-10-CM

## 2021-07-06 DIAGNOSIS — N179 Acute kidney failure, unspecified: Secondary | ICD-10-CM | POA: Diagnosis present

## 2021-07-06 DIAGNOSIS — Z881 Allergy status to other antibiotic agents status: Secondary | ICD-10-CM

## 2021-07-06 DIAGNOSIS — Z79899 Other long term (current) drug therapy: Secondary | ICD-10-CM

## 2021-07-06 DIAGNOSIS — R609 Edema, unspecified: Secondary | ICD-10-CM | POA: Diagnosis not present

## 2021-07-06 DIAGNOSIS — E785 Hyperlipidemia, unspecified: Secondary | ICD-10-CM | POA: Diagnosis not present

## 2021-07-06 DIAGNOSIS — I10 Essential (primary) hypertension: Secondary | ICD-10-CM | POA: Diagnosis present

## 2021-07-06 DIAGNOSIS — F32A Depression, unspecified: Secondary | ICD-10-CM | POA: Diagnosis not present

## 2021-07-06 DIAGNOSIS — G629 Polyneuropathy, unspecified: Secondary | ICD-10-CM | POA: Diagnosis not present

## 2021-07-06 DIAGNOSIS — E875 Hyperkalemia: Secondary | ICD-10-CM | POA: Diagnosis not present

## 2021-07-06 DIAGNOSIS — R000 Tachycardia, unspecified: Secondary | ICD-10-CM | POA: Diagnosis not present

## 2021-07-06 DIAGNOSIS — R6510 Systemic inflammatory response syndrome (SIRS) of non-infectious origin without acute organ dysfunction: Secondary | ICD-10-CM | POA: Diagnosis present

## 2021-07-06 DIAGNOSIS — Z86718 Personal history of other venous thrombosis and embolism: Secondary | ICD-10-CM

## 2021-07-06 DIAGNOSIS — Z853 Personal history of malignant neoplasm of breast: Secondary | ICD-10-CM

## 2021-07-06 DIAGNOSIS — R109 Unspecified abdominal pain: Secondary | ICD-10-CM | POA: Diagnosis not present

## 2021-07-06 DIAGNOSIS — G9341 Metabolic encephalopathy: Secondary | ICD-10-CM | POA: Diagnosis not present

## 2021-07-06 DIAGNOSIS — Z91040 Latex allergy status: Secondary | ICD-10-CM | POA: Diagnosis not present

## 2021-07-06 DIAGNOSIS — I129 Hypertensive chronic kidney disease with stage 1 through stage 4 chronic kidney disease, or unspecified chronic kidney disease: Secondary | ICD-10-CM | POA: Diagnosis not present

## 2021-07-06 DIAGNOSIS — F17210 Nicotine dependence, cigarettes, uncomplicated: Secondary | ICD-10-CM | POA: Diagnosis not present

## 2021-07-06 DIAGNOSIS — M545 Low back pain, unspecified: Secondary | ICD-10-CM | POA: Diagnosis present

## 2021-07-06 DIAGNOSIS — L03116 Cellulitis of left lower limb: Secondary | ICD-10-CM | POA: Diagnosis not present

## 2021-07-06 DIAGNOSIS — Z8249 Family history of ischemic heart disease and other diseases of the circulatory system: Secondary | ICD-10-CM

## 2021-07-06 DIAGNOSIS — M810 Age-related osteoporosis without current pathological fracture: Secondary | ICD-10-CM | POA: Diagnosis present

## 2021-07-06 DIAGNOSIS — Z86010 Personal history of colonic polyps: Secondary | ICD-10-CM | POA: Diagnosis not present

## 2021-07-06 DIAGNOSIS — E440 Moderate protein-calorie malnutrition: Secondary | ICD-10-CM | POA: Insufficient documentation

## 2021-07-06 DIAGNOSIS — Z880 Allergy status to penicillin: Secondary | ICD-10-CM | POA: Diagnosis not present

## 2021-07-06 DIAGNOSIS — G9340 Encephalopathy, unspecified: Secondary | ICD-10-CM | POA: Diagnosis not present

## 2021-07-06 DIAGNOSIS — N1831 Chronic kidney disease, stage 3a: Secondary | ICD-10-CM | POA: Diagnosis present

## 2021-07-06 DIAGNOSIS — Z800 Family history of malignant neoplasm of digestive organs: Secondary | ICD-10-CM

## 2021-07-06 DIAGNOSIS — I700 Atherosclerosis of aorta: Secondary | ICD-10-CM | POA: Diagnosis not present

## 2021-07-06 DIAGNOSIS — F419 Anxiety disorder, unspecified: Secondary | ICD-10-CM | POA: Diagnosis present

## 2021-07-06 DIAGNOSIS — A419 Sepsis, unspecified organism: Secondary | ICD-10-CM | POA: Diagnosis not present

## 2021-07-06 DIAGNOSIS — Z981 Arthrodesis status: Secondary | ICD-10-CM | POA: Diagnosis not present

## 2021-07-06 DIAGNOSIS — R6520 Severe sepsis without septic shock: Secondary | ICD-10-CM | POA: Diagnosis not present

## 2021-07-06 DIAGNOSIS — Z803 Family history of malignant neoplasm of breast: Secondary | ICD-10-CM

## 2021-07-06 DIAGNOSIS — G8929 Other chronic pain: Secondary | ICD-10-CM | POA: Diagnosis present

## 2021-07-06 DIAGNOSIS — C50212 Malignant neoplasm of upper-inner quadrant of left female breast: Secondary | ICD-10-CM | POA: Diagnosis present

## 2021-07-06 LAB — CBC WITH DIFFERENTIAL/PLATELET
Abs Immature Granulocytes: 0.26 10*3/uL — ABNORMAL HIGH (ref 0.00–0.07)
Basophils Absolute: 0.1 10*3/uL (ref 0.0–0.1)
Basophils Relative: 0 %
Eosinophils Absolute: 0 10*3/uL (ref 0.0–0.5)
Eosinophils Relative: 0 %
HCT: 39 % (ref 36.0–46.0)
Hemoglobin: 12.7 g/dL (ref 12.0–15.0)
Immature Granulocytes: 1 %
Lymphocytes Relative: 4 %
Lymphs Abs: 1.1 10*3/uL (ref 0.7–4.0)
MCH: 32.2 pg (ref 26.0–34.0)
MCHC: 32.6 g/dL (ref 30.0–36.0)
MCV: 98.7 fL (ref 80.0–100.0)
Monocytes Absolute: 0.9 10*3/uL (ref 0.1–1.0)
Monocytes Relative: 4 %
Neutro Abs: 21.3 10*3/uL — ABNORMAL HIGH (ref 1.7–7.7)
Neutrophils Relative %: 91 %
Platelets: 187 10*3/uL (ref 150–400)
RBC: 3.95 MIL/uL (ref 3.87–5.11)
RDW: 13.3 % (ref 11.5–15.5)
WBC: 23.6 10*3/uL — ABNORMAL HIGH (ref 4.0–10.5)
nRBC: 0 % (ref 0.0–0.2)

## 2021-07-06 LAB — COMPREHENSIVE METABOLIC PANEL
ALT: 39 U/L (ref 0–44)
AST: 45 U/L — ABNORMAL HIGH (ref 15–41)
Albumin: 3.8 g/dL (ref 3.5–5.0)
Alkaline Phosphatase: 62 U/L (ref 38–126)
Anion gap: 9 (ref 5–15)
BUN: 24 mg/dL — ABNORMAL HIGH (ref 8–23)
CO2: 22 mmol/L (ref 22–32)
Calcium: 9.5 mg/dL (ref 8.9–10.3)
Chloride: 105 mmol/L (ref 98–111)
Creatinine, Ser: 2.23 mg/dL — ABNORMAL HIGH (ref 0.44–1.00)
GFR, Estimated: 23 mL/min — ABNORMAL LOW (ref >60)
Glucose, Bld: 85 mg/dL (ref 70–99)
Potassium: 5 mmol/L (ref 3.5–5.1)
Sodium: 136 mmol/L (ref 135–145)
Total Bilirubin: 0.3 mg/dL (ref 0.3–1.2)
Total Protein: 7.1 g/dL (ref 6.5–8.1)

## 2021-07-06 LAB — PROTIME-INR
INR: 1 (ref 0.8–1.2)
Prothrombin Time: 13.3 seconds (ref 11.4–15.2)

## 2021-07-06 LAB — LACTIC ACID, PLASMA: Lactic Acid, Venous: 1.2 mmol/L (ref 0.5–1.9)

## 2021-07-06 MED ORDER — ACETAMINOPHEN 500 MG PO TABS
1000.0000 mg | ORAL_TABLET | Freq: Once | ORAL | Status: AC
  Administered 2021-07-06: 1000 mg via ORAL
  Filled 2021-07-06: qty 2

## 2021-07-06 NOTE — ED Triage Notes (Addendum)
Pt's husband c/o confusion and fever that started today. States she had diarrhea x 4 days ago. Temp 103.1 here ?

## 2021-07-07 ENCOUNTER — Encounter (HOSPITAL_COMMUNITY): Payer: Self-pay | Admitting: Internal Medicine

## 2021-07-07 ENCOUNTER — Encounter: Payer: Self-pay | Admitting: Hematology

## 2021-07-07 DIAGNOSIS — Z853 Personal history of malignant neoplasm of breast: Secondary | ICD-10-CM | POA: Diagnosis not present

## 2021-07-07 DIAGNOSIS — A419 Sepsis, unspecified organism: Secondary | ICD-10-CM | POA: Diagnosis present

## 2021-07-07 DIAGNOSIS — G9341 Metabolic encephalopathy: Secondary | ICD-10-CM | POA: Diagnosis present

## 2021-07-07 DIAGNOSIS — E785 Hyperlipidemia, unspecified: Secondary | ICD-10-CM | POA: Diagnosis present

## 2021-07-07 DIAGNOSIS — Z86718 Personal history of other venous thrombosis and embolism: Secondary | ICD-10-CM | POA: Diagnosis not present

## 2021-07-07 DIAGNOSIS — E440 Moderate protein-calorie malnutrition: Secondary | ICD-10-CM | POA: Diagnosis present

## 2021-07-07 DIAGNOSIS — R6510 Systemic inflammatory response syndrome (SIRS) of non-infectious origin without acute organ dysfunction: Secondary | ICD-10-CM | POA: Diagnosis not present

## 2021-07-07 DIAGNOSIS — L03116 Cellulitis of left lower limb: Secondary | ICD-10-CM | POA: Diagnosis present

## 2021-07-07 DIAGNOSIS — F17210 Nicotine dependence, cigarettes, uncomplicated: Secondary | ICD-10-CM | POA: Diagnosis present

## 2021-07-07 DIAGNOSIS — Z880 Allergy status to penicillin: Secondary | ICD-10-CM | POA: Diagnosis not present

## 2021-07-07 DIAGNOSIS — R410 Disorientation, unspecified: Secondary | ICD-10-CM | POA: Diagnosis present

## 2021-07-07 DIAGNOSIS — F32A Depression, unspecified: Secondary | ICD-10-CM | POA: Diagnosis present

## 2021-07-07 DIAGNOSIS — I341 Nonrheumatic mitral (valve) prolapse: Secondary | ICD-10-CM | POA: Diagnosis present

## 2021-07-07 DIAGNOSIS — N179 Acute kidney failure, unspecified: Secondary | ICD-10-CM | POA: Diagnosis present

## 2021-07-07 DIAGNOSIS — Z981 Arthrodesis status: Secondary | ICD-10-CM | POA: Diagnosis not present

## 2021-07-07 DIAGNOSIS — R109 Unspecified abdominal pain: Secondary | ICD-10-CM | POA: Diagnosis not present

## 2021-07-07 DIAGNOSIS — Z86010 Personal history of colonic polyps: Secondary | ICD-10-CM | POA: Diagnosis not present

## 2021-07-07 DIAGNOSIS — N1831 Chronic kidney disease, stage 3a: Secondary | ICD-10-CM | POA: Diagnosis present

## 2021-07-07 DIAGNOSIS — R609 Edema, unspecified: Secondary | ICD-10-CM | POA: Diagnosis not present

## 2021-07-07 DIAGNOSIS — Z9011 Acquired absence of right breast and nipple: Secondary | ICD-10-CM | POA: Diagnosis not present

## 2021-07-07 DIAGNOSIS — I129 Hypertensive chronic kidney disease with stage 1 through stage 4 chronic kidney disease, or unspecified chronic kidney disease: Secondary | ICD-10-CM | POA: Diagnosis present

## 2021-07-07 DIAGNOSIS — Z20822 Contact with and (suspected) exposure to covid-19: Secondary | ICD-10-CM | POA: Diagnosis present

## 2021-07-07 DIAGNOSIS — R197 Diarrhea, unspecified: Secondary | ICD-10-CM | POA: Diagnosis present

## 2021-07-07 DIAGNOSIS — E875 Hyperkalemia: Secondary | ICD-10-CM | POA: Diagnosis present

## 2021-07-07 DIAGNOSIS — Z91040 Latex allergy status: Secondary | ICD-10-CM | POA: Diagnosis not present

## 2021-07-07 DIAGNOSIS — Z888 Allergy status to other drugs, medicaments and biological substances status: Secondary | ICD-10-CM | POA: Diagnosis not present

## 2021-07-07 DIAGNOSIS — G629 Polyneuropathy, unspecified: Secondary | ICD-10-CM | POA: Diagnosis present

## 2021-07-07 DIAGNOSIS — M810 Age-related osteoporosis without current pathological fracture: Secondary | ICD-10-CM | POA: Diagnosis present

## 2021-07-07 LAB — CBC WITH DIFFERENTIAL/PLATELET
Abs Immature Granulocytes: 0.16 10*3/uL — ABNORMAL HIGH (ref 0.00–0.07)
Basophils Absolute: 0 10*3/uL (ref 0.0–0.1)
Basophils Relative: 0 %
Eosinophils Absolute: 0 10*3/uL (ref 0.0–0.5)
Eosinophils Relative: 0 %
HCT: 30.7 % — ABNORMAL LOW (ref 36.0–46.0)
Hemoglobin: 9.9 g/dL — ABNORMAL LOW (ref 12.0–15.0)
Immature Granulocytes: 1 %
Lymphocytes Relative: 6 %
Lymphs Abs: 1.2 10*3/uL (ref 0.7–4.0)
MCH: 32.2 pg (ref 26.0–34.0)
MCHC: 32.2 g/dL (ref 30.0–36.0)
MCV: 100 fL (ref 80.0–100.0)
Monocytes Absolute: 0.9 10*3/uL (ref 0.1–1.0)
Monocytes Relative: 4 %
Neutro Abs: 17.7 10*3/uL — ABNORMAL HIGH (ref 1.7–7.7)
Neutrophils Relative %: 89 %
Platelets: 168 10*3/uL (ref 150–400)
RBC: 3.07 MIL/uL — ABNORMAL LOW (ref 3.87–5.11)
RDW: 13.6 % (ref 11.5–15.5)
WBC: 19.9 10*3/uL — ABNORMAL HIGH (ref 4.0–10.5)
nRBC: 0 % (ref 0.0–0.2)

## 2021-07-07 LAB — HEPATIC FUNCTION PANEL
ALT: 28 U/L (ref 0–44)
AST: 45 U/L — ABNORMAL HIGH (ref 15–41)
Albumin: 2.8 g/dL — ABNORMAL LOW (ref 3.5–5.0)
Alkaline Phosphatase: 45 U/L (ref 38–126)
Bilirubin, Direct: 0.5 mg/dL — ABNORMAL HIGH (ref 0.0–0.2)
Indirect Bilirubin: 0.7 mg/dL (ref 0.3–0.9)
Total Bilirubin: 1.2 mg/dL (ref 0.3–1.2)
Total Protein: 5.1 g/dL — ABNORMAL LOW (ref 6.5–8.1)

## 2021-07-07 LAB — URINALYSIS, ROUTINE W REFLEX MICROSCOPIC
Bacteria, UA: NONE SEEN
Bilirubin Urine: NEGATIVE
Glucose, UA: NEGATIVE mg/dL
Hgb urine dipstick: NEGATIVE
Ketones, ur: NEGATIVE mg/dL
Leukocytes,Ua: NEGATIVE
Nitrite: NEGATIVE
Protein, ur: 100 mg/dL — AB
Specific Gravity, Urine: 1.014 (ref 1.005–1.030)
pH: 8 (ref 5.0–8.0)

## 2021-07-07 LAB — LACTIC ACID, PLASMA: Lactic Acid, Venous: 1.1 mmol/L (ref 0.5–1.9)

## 2021-07-07 LAB — BASIC METABOLIC PANEL
Anion gap: 6 (ref 5–15)
BUN: 26 mg/dL — ABNORMAL HIGH (ref 8–23)
CO2: 20 mmol/L — ABNORMAL LOW (ref 22–32)
Calcium: 8.2 mg/dL — ABNORMAL LOW (ref 8.9–10.3)
Chloride: 110 mmol/L (ref 98–111)
Creatinine, Ser: 2.23 mg/dL — ABNORMAL HIGH (ref 0.44–1.00)
GFR, Estimated: 23 mL/min — ABNORMAL LOW (ref >60)
Glucose, Bld: 99 mg/dL (ref 70–99)
Potassium: 5.5 mmol/L — ABNORMAL HIGH (ref 3.5–5.1)
Sodium: 136 mmol/L (ref 135–145)

## 2021-07-07 LAB — RESP PANEL BY RT-PCR (FLU A&B, COVID) ARPGX2
Influenza A by PCR: NEGATIVE
Influenza B by PCR: NEGATIVE
SARS Coronavirus 2 by RT PCR: NEGATIVE

## 2021-07-07 MED ORDER — SODIUM POLYSTYRENE SULFONATE 15 GM/60ML PO SUSP
15.0000 g | Freq: Once | ORAL | Status: AC
  Administered 2021-07-07: 15 g via ORAL
  Filled 2021-07-07: qty 60

## 2021-07-07 MED ORDER — VANCOMYCIN HCL IN DEXTROSE 1-5 GM/200ML-% IV SOLN
1000.0000 mg | Freq: Once | INTRAVENOUS | Status: AC
  Administered 2021-07-07: 1000 mg via INTRAVENOUS
  Filled 2021-07-07: qty 200

## 2021-07-07 MED ORDER — SODIUM CHLORIDE 0.9 % IV SOLN
1.0000 g | Freq: Three times a day (TID) | INTRAVENOUS | Status: DC
  Administered 2021-07-07 – 2021-07-08 (×3): 1 g via INTRAVENOUS
  Filled 2021-07-07 (×6): qty 5

## 2021-07-07 MED ORDER — SODIUM CHLORIDE 0.9 % IV SOLN: INTRAVENOUS | Status: AC

## 2021-07-07 MED ORDER — SODIUM CHLORIDE 0.9 % IV SOLN
2.0000 g | Freq: Once | INTRAVENOUS | Status: AC
  Administered 2021-07-07: 2 g via INTRAVENOUS
  Filled 2021-07-07: qty 10

## 2021-07-07 MED ORDER — HEPARIN SODIUM (PORCINE) 5000 UNIT/ML IJ SOLN
5000.0000 [IU] | Freq: Three times a day (TID) | INTRAMUSCULAR | Status: DC
  Administered 2021-07-07 – 2021-07-09 (×6): 5000 [IU] via SUBCUTANEOUS
  Filled 2021-07-07 (×6): qty 1

## 2021-07-07 MED ORDER — ACETAMINOPHEN 650 MG RE SUPP: 650.0000 mg | Freq: Four times a day (QID) | RECTAL | Status: DC | PRN

## 2021-07-07 MED ORDER — METRONIDAZOLE 500 MG/100ML IV SOLN
500.0000 mg | Freq: Once | INTRAVENOUS | Status: AC
  Administered 2021-07-07: 500 mg via INTRAVENOUS
  Filled 2021-07-07: qty 100

## 2021-07-07 MED ORDER — SODIUM CHLORIDE 0.9 % IV BOLUS (SEPSIS)
1000.0000 mL | Freq: Once | INTRAVENOUS | Status: AC
Start: 2021-07-07 — End: 2021-07-07
  Administered 2021-07-07: 1000 mL via INTRAVENOUS

## 2021-07-07 MED ORDER — SODIUM CHLORIDE 0.9 % IV SOLN: 1000.0000 mL | INTRAVENOUS | Status: DC

## 2021-07-07 MED ORDER — VANCOMYCIN HCL 750 MG/150ML IV SOLN: 750.0000 mg | INTRAVENOUS | Status: DC

## 2021-07-07 MED ORDER — SODIUM CHLORIDE 0.9 % IV BOLUS
1000.0000 mL | Freq: Once | INTRAVENOUS | Status: AC
  Administered 2021-07-07: 1000 mL via INTRAVENOUS

## 2021-07-07 MED ORDER — ACETAMINOPHEN 325 MG PO TABS
650.0000 mg | ORAL_TABLET | Freq: Four times a day (QID) | ORAL | Status: DC | PRN
  Administered 2021-07-07: 650 mg via ORAL
  Filled 2021-07-07: qty 2

## 2021-07-07 NOTE — ED Notes (Signed)
Pt currently sleeping with even, unlabored resp. Will delay heparin to allow pt to rest.  ?

## 2021-07-07 NOTE — ED Provider Notes (Signed)
?[place] ?Provider Note ? ?CSN: [id number] ?Arrival date & time: 07/06/21 2222 ? ?Chief Complaint(s) ?Altered Mental Status ? ?HPI ?Connie Bennett is a 71 y.o. female   ? ? ?Altered Mental Status ?Presenting symptoms: disorientation   ?Severity:  Moderate ?Most recent episode:  Today ?Episode history:  Single ?Timing:  Constant ?Progression:  Waxing and waning ?Chronicity:  New ?Context comment:  Noted to have fever, Tmax 104 this afternoon ?Associated symptoms: fever   ?Associated symptoms: no abdominal pain, no bladder incontinence, no difficulty breathing, no headaches, no light-headedness, no nausea, no rash and no vomiting   ? ?Patient reportedly had diarrhea a few days ago that resolved. No recent infections requiring antibiotic use. ? ?Past Medical History ?Past Medical History:  ?Diagnosis Date  ? Anxiety   ? takes Xanax daily as needed  ? Arthritis   ? Bilateral lower extremity edema   ? takes Furosemide daily as needed  ? Breast cancer [facility]) oncologist-  dr Truitt Merle--  right upper-outer quadrant   ? dx May 2016--- Stage 0  (Tis,N0,M0)  DCIS  Right breast---  09-13-2014  s/p  radioactive seed/ partial mastectomy / sln bx  ? Chronic fatigue syndrome   ? Chronic low back pain   ? Complication of anesthesia   ? spinal cord stimulator- to be off for surgery  ? Depression   ? takes Effexor daily  ? Family history of adverse reaction to anesthesia   ? mom hard to wake up  ? Fibromyalgia   ? Headache(784.0)   ? couple of times a week  ? Heart murmur   ? History of bronchitis > 35yr ago  ? History of colon polyps   ? benign  ? History of drug overdose   ? oxycontin  and oxycodone  Sept 2015--  now has narcan injection prescription  ? History of DVT of lower extremity   ? 2008--  BILATERAL  ? History of hiatal hernia   ? HTN (hypertension)   ? takes Amlodipine and Ramipril daily  ? Hyperlipidemia   ? takes Zetia and Crestor  daily  ? Joint pain   ? Mitral valve prolapse   ?  MILD /   PER PT ASYMPTOMATIC  ? Muscle spasm   ? takes Zanaflex daily as needed  ? Osteoporosis   ? Peripheral neuropathy   ? takes Neurontin daily  ? Peripheral vascular disease ([facility]   ? hx dvt's  ? RSD (reflex sympathetic dystrophy)   ? left wrist and forearm from a fx  ? RSD (reflex sympathetic dystrophy)   ? Sjogren's disease ([facility]   ? Vertigo   ? takes Meclizine daily   ? ?Patient Active Problem List  ? Diagnosis Date Noted  ? SIRS (systemic inflammatory response syndrome) ([facility] 07/07/2021  ? Osteopenia 11/30/2015  ? Anemia of chronic disease 08/31/2015  ? Breast cancer of upper-inner quadrant of left female breast ([facility] 06/29/2015  ? Status post mastectomy 01/25/2015  ? Preoperative clearance 12/15/2014  ? Right bundle branch block 12/15/2014  ? Essential hypertension 12/15/2014  ? Mitral valve prolapse 12/15/2014  ? Breast cancer of upper-outer quadrant of right female breast ([facility] 09/04/2014  ? Complex sleep apnea syndrome 11/14/2010  ? ?Home Medication(s) ?Prior to Admission medications   ?Medication Sig Start Date End Date Taking? Authorizing Provider  ?ALPRAZolam (XANAX) 0.5 MG tablet Take 0.5-1 mg by mouth 2 (two) times daily as needed for anxiety. Take 1 tablet (0.5 mg) by mouth  if needed during the day, and 2 tablets (1 mg) if needed at bedtime    [provider]  ?anastrozole (ARIMIDEX) 1 MG tablet TAKE 1 TABLET BY MOUTH EVERY DAY 06/01/21   Truitt Merle, MD  ?Ascorbic Acid (VITAMIN C) 1000 MG tablet Take 1,000 mg by mouth daily.    [provider]  ?calcium carbonate (OS-CAL) 600 MG TABS tablet Take 600 mg by mouth daily with breakfast.    [provider]  ?Cholecalciferol (VITAMIN D) 50 MCG (2000 UT) tablet Take 2,000 Units by mouth daily.    [provider]  ?cyclobenzaprine (FLEXERIL) 10 MG tablet Take 10 mg by mouth 3 (three) times daily as needed for muscle spasms.    [provider]  ?ezetimibe (ZETIA) 10 MG tablet Take 10 mg by mouth at bedtime.      [provider]  ?Flaxseed, Linseed, (FLAX SEED OIL) 1000 MG CAPS Take by mouth.    [provider]  ?furosemide (LASIX) 40 MG tablet Take 40 mg by mouth daily as needed for fluid or edema (leg swelling). Reported on 06/29/2015 08/21/14   [provider]  ?gabapentin (NEURONTIN) 600 MG tablet Take 600 mg by mouth 3 (three) times daily.      [provider]  ?Lactobacillus (PROBIOTIC ACIDOPHILUS PO) Take 1 tablet by mouth every morning.    [provider]  ?Omega-3 Fatty Acids (FISH OIL) 1000 MG CAPS Take by mouth.    [provider]  ?OVER THE COUNTER MEDICATION Take 1 drop by mouth daily as needed (dry eyes). OTC lubricating eye drops    [provider]  ?oxyCODONE-acetaminophen (PERCOCET) 10-325 MG tablet Take 1 tablet by mouth every 4 (four) hours as needed for pain.    [provider]  ?PROAIR HFA 108 (90 Base) MCG/ACT inhaler Inhale 2 puffs into the lungs every 4 (four) hours as needed. 08/06/16   [provider]  ?ramipril (ALTACE) 10 MG capsule Take 10 mg by mouth daily.    [provider]  ?rosuvastatin (CRESTOR) 20 MG tablet Take 20 mg by mouth daily.    [provider]  ?tiZANidine (ZANAFLEX) 4 MG tablet Take 4 mg by mouth every 6 (six) hours as needed for muscle spasms.    [provider]  ?venlafaxine (EFFEXOR-XR) 150 MG 24 hr capsule Take 150 mg by mouth 2 (two) times daily. Take 1 capsule (150 mg) by mouth daily with breakfast and lunch    [provider]  ?                                                                                                                                  ?Allergies ?Carbocaine [mepivacaine hcl], Penicillins, Sulfa antibiotics, Amlodipine, and Latex ? ?Review of Systems ?Review of Systems  ?Constitutional:  Positive for fever.  ?Gastrointestinal:  Negative for abdominal pain, nausea and vomiting.  ?Genitourinary:  Negative for bladder incontinence.  ?  Skin:   Negative for rash.  ?Neurological:  Negative for light-headedness and headaches.  ?As noted in HPI ? ?Physical Exam ?Vital Signs  ?I have reviewed the triage vital signs ?BP 111/60   Pulse 67   Temp 99 ?F (37.2 ?C) (Oral)   Resp 19   Ht '5\' 3"'$  (1.6 m)   Wt 57.1 kg   SpO2 96%   BMI 22.30 kg/m?  ? ?Physical Exam ?Vitals reviewed.  ?Constitutional:   ?   General: She is not in acute distress. ?   Appearance: She is well-developed. She is not diaphoretic.  ?HENT:  ?   Head: Normocephalic and atraumatic.  ?   Nose: Nose normal.  ?Eyes:  ?   General: No scleral icterus.    ?   Right eye: No discharge.     ?   Left eye: No discharge.  ?   Conjunctiva/sclera: Conjunctivae normal.  ?   Pupils: Pupils are equal, round, and reactive to light.  ?Cardiovascular:  ?   Rate and Rhythm: Regular rhythm. Tachycardia present.  ?   Heart sounds: No murmur heard. ?  No friction rub. No gallop.  ?Pulmonary:  ?   Effort: Pulmonary effort is normal. No respiratory distress.  ?   Breath sounds: Normal breath sounds. No stridor. No rales.  ?Abdominal:  ?   General: There is no distension.  ?   Palpations: Abdomen is soft.  ?   Tenderness: There is no abdominal tenderness. There is no guarding or rebound.  ?Musculoskeletal:     ?   General: No tenderness.  ?   Cervical back: Normal range of motion and neck supple.  ?Skin: ?   General: Skin is warm and dry.  ?   Findings: No erythema or rash.  ?Neurological:  ?   Mental Status: She is alert. She is disoriented.  ? ? ?ED Results and Treatments ?Labs ?(all labs ordered are listed, but only abnormal results are displayed) ?Labs Reviewed  ?COMPREHENSIVE METABOLIC PANEL - Abnormal; Notable for the following components:  ?    Result Value  ? BUN 24 (*)   ? Creatinine, Ser 2.23 (*)   ? AST 45 (*)   ? GFR, Estimated 23 (*)   ? All other components within normal limits  ?CBC WITH DIFFERENTIAL/PLATELET - Abnormal; Notable for the following components:  ? WBC 23.6 (*)   ? Neutro Abs 21.3 (*)   ?  Abs Immature Granulocytes 0.26 (*)   ? All other components within normal limits  ?URINALYSIS, ROUTINE W REFLEX MICROSCOPIC - Abnormal; Notable for the following components:  ? Protein, ur 100 (*)   ? All other comp

## 2021-07-07 NOTE — Sepsis Progress Note (Signed)
[facility] following for Sepsis Protocol 

## 2021-07-07 NOTE — Progress Notes (Signed)
This is a no charge noticed patient was admitted this AM.  H&P reviewed.  Patient seen and examined. ? ? Connie Bennett is a 71 y.o. female with history of breast cancer in remission, mitral valve prolapse, hypertension, prior history of DVT was brought to the ER after patient has been found that patient was getting increasingly confused since yesterday afternoon.  About 3 days ago patient had diarrhea which was dark black in color multiple episodes.  It resolved without any intervention.  Yesterday afternoon patient was found to be confused and later in the evening patient had a fever of 104 ?F.  Patient did not have any nausea vomiting abdominal pain diarrhea chest pain productive cough headache yesterday.  Given the symptoms patient was brought to the ER. ?  ?ED Course: In the ER patient was having low blood pressure in the 37D systolic tachycardic febrile with 103 ?F initially confused.  Patient had fluids started blood cultures sent.  Chest x-ray UA unremarkable ? ?This a.m. she is alert and oriented.  When I asked her does she know why she came here she goes apparently she was confused.  Denies shortness of breath, abdominal pain, nausea vomiting or diarrhea.  COVID SARS negative. ? ?Cta no w/r ?Reg s1/s2 no gallop ?Soft benign +bs ?No edema ?Aaxoxo3 ? ? ?K 5.5 ? ? ?A/P ?Give Kayexalate 15 g x 1 ?Follow-up blood cultures currently pending ?MS appears to be improved ?Decrease IV fluids to 100 MLS per hour ?Continue IV antibiotic ?Trend wbc ? ?

## 2021-07-07 NOTE — ED Notes (Signed)
Pt ambulated to bathroom with staff assistance for safety.  ?

## 2021-07-07 NOTE — Care Management Obs Status (Signed)
MEDICARE OBSERVATION STATUS NOTIFICATION ? ? ?Patient Details  ?Name: Connie Bennett ?MRN: [id number] ?Date of Birth: [birth year] ? ? ?Medicare Observation Status Notification Given:  Yes ? ?Verbal permission to sign via phone via husband.  ? ?Verdell Carmine, RN ?07/07/2021, 2:26 PM ?

## 2021-07-07 NOTE — Progress Notes (Signed)
Pharmacy Antibiotic Note ? ?Connie Bennett is a 71 y.o. female admitted on 07/06/2021 with sepsis.  Pharmacy has been consulted for Vancomycin/Aztreonam dosing. WBC is elevated. Noted renal dysfunction.  ? ?Plan: ?Vancomycin 750 mg IV q48h ?>>>Estimated AUC: 456 ?Aztreonam 1g IV q8h ?Trend WBC, temp, renal function  ?F/U infectious work-up ?Drug levels as indicated ? ? ?Height: '5\' 3"'$  (160 cm) ?Weight: 57.1 kg (125 lb 14.1 oz) ?IBW/kg (Calculated) : 52.4 ? ?Temp (24hrs), Avg:101.1 ?F (38.4 ?C), Min:99 ?F (37.2 ?C), Max:103.2 ?F (39.6 ?C) ? ?Recent Labs  ?Lab 07/06/21 ?2249 07/07/21 ?0052  ?WBC 23.6*  --   ?CREATININE 2.23*  --   ?LATICACIDVEN 1.2 1.1  ?  ?Estimated Creatinine Clearance: 18.9 mL/min (A) (by C-G formula based on SCr of 2.23 mg/dL (H)).   ? ?Allergies  ?Allergen Reactions  ? Carbocaine [Mepivacaine Hcl] Hives and Swelling  ? Penicillins Nausea And Vomiting, Swelling and Rash  ?  Has patient had a PCN reaction causing immediate rash, facial/tongue/throat swelling, SOB or lightheadedness with hypotension: Yes ?Has patient had a PCN reaction causing severe rash involving mucus membranes or skin necrosis: No ?Has patient had a PCN reaction that required hospitalization No ?Has patient had a PCN reaction occurring within the last 10 years: Yes ?If all of the above answers are "NO", then may proceed with Cephalosporin use.  ? Sulfa Antibiotics Nausea And Vomiting, Swelling, Rash and Other (See Comments)  ?  Headache ?  ? Amlodipine Other (See Comments)  ?  Unspecified  ? Latex Rash  ?  Reaction to gloves and bandaids (paper tape is ok)  ? ? ?Narda Bonds, PharmD, BCPS ?Clinical Pharmacist ?Phone: [phone] ? ?

## 2021-07-07 NOTE — TOC Initial Note (Signed)
Transition of Care (TOC) - Initial/Assessment Note  ? ? ?Patient Details  ?Name: Connie Bennett ?MRN: [id number] ?Date of Birth: May 15, 1950 ? ?Transition of Care (TOC) CM/SW Contact:    ?Verdell Carmine, RN ?Phone Number: ?07/07/2021, 2:59 PM ? ?Clinical Narrative:                 ? ?Transition of Care Department [facility]) has reviewed patient and no TOC needs have been identified at this time. We will continue to monitor patient advancement through interdisciplinary progression rounds. If new patient transition needs arise, please place a TOC consult. ?  ?  ?  ?  ? ? ?Patient Goals and CMS Choice ?  ?  ?  ? ?Expected Discharge Plan and Services ?  ?  ?  ?  ?  ?                ?  ?  ?  ?  ?  ?  ?  ?  ?  ?  ? ?Prior Living Arrangements/Services ?  ?  ?  ?       ?  ?  ?  ?  ? ?Activities of Daily Living ?  ?  ? ?Permission Sought/Granted ?  ?  ?   ?   ?   ?   ? ?Emotional Assessment ?  ?  ?  ?  ?  ?  ? ?Admission diagnosis:  SIRS (systemic inflammatory response syndrome) (HCC) [R65.10] ?Patient Active Problem List  ? Diagnosis Date Noted  ? SIRS (systemic inflammatory response syndrome) ([place]) 07/07/2021  ? Osteopenia 11/30/2015  ? Anemia of chronic disease 08/31/2015  ? Breast cancer of upper-inner quadrant of left female breast ([place]) 06/29/2015  ? Status post mastectomy 01/25/2015  ? Preoperative clearance 12/15/2014  ? Right bundle branch block 12/15/2014  ? Essential hypertension 12/15/2014  ? Mitral valve prolapse 12/15/2014  ? Breast cancer of upper-outer quadrant of right female breast ([place]) 09/04/2014  ? Complex sleep apnea syndrome 11/14/2010  ? ?PCP:  Mayra Neer, MD ?Pharmacy:   ?[facility] [address] ?[address] ?[place] [address] ?Phone: [phone] Fax: [phone] ? ?[id number] [place],  - [address]?[id number]?Phone: [phone]: [phone]? ? ? ? ?Social Determinants of Health (SDOH)  Interventions ?  ? ?Readmission Risk Interventions ?   ? View : No data to display.  ?  ?  ?  ? ? ? ?

## 2021-07-07 NOTE — H&P (Signed)
?History and Physical  ? ? ?Connie Bennett OVF:[id number] DOB: [birth year] DOA: 07/06/2021 ? ?PCP: Mayra Neer, MD  ?Patient coming from: Home. ? ?Chief Complaint: Fever and confusion. ? ?History obtained from patient's husband and patient.  Previous records. ? ?HPI: Connie Bennett is a 71 y.o. female with history of breast cancer in remission, mitral valve prolapse, hypertension, prior history of DVT was brought to the ER after patient has been found that patient was getting increasingly confused since yesterday afternoon.  About 3 days ago patient had diarrhea which was dark black in color multiple episodes.  It resolved without any intervention.  Yesterday afternoon patient was found to be confused and later in the evening patient had a fever of 104 ?F.  Patient did not have any nausea vomiting abdominal pain diarrhea chest pain productive cough headache yesterday.  Given the symptoms patient was brought to the ER. ? ?ED Course: In the ER patient was having low blood pressure in the 84Z systolic tachycardic febrile with 103 ?F initially confused.  Patient had fluids started blood cultures sent.  Chest x-ray UA unremarkable.  By the time I examined the patient is become more alert and awake.  Oriented to time place and person.  His COVID test is pending.  Patient blood work shows significant leukocytosis and acute renal failure.  Patient admitted for SIRS source not clear. ? ?Review of Systems: As per HPI, rest all negative. ? ? ?Past Medical History:  ?Diagnosis Date  ? Anxiety   ? takes Xanax daily as needed  ? Arthritis   ? Bilateral lower extremity edema   ? takes Furosemide daily as needed  ? Breast cancer [facility]) oncologist-  dr Truitt Merle--  right upper-outer quadrant   ? dx May 2016--- Stage 0  (Tis,N0,M0)  DCIS  Right breast---  09-13-2014  s/p  radioactive seed/ partial mastectomy / sln bx  ? Chronic fatigue syndrome   ? Chronic low back pain   ? Complication of anesthesia   ? spinal cord stimulator- to  be off for surgery  ? Depression   ? takes Effexor daily  ? Family history of adverse reaction to anesthesia   ? mom hard to wake up  ? Fibromyalgia   ? Headache(784.0)   ? couple of times a week  ? Heart murmur   ? History of bronchitis > 25yr ago  ? History of colon polyps   ? benign  ? History of drug overdose   ? oxycontin  and oxycodone  Sept 2015--  now has narcan injection prescription  ? History of DVT of lower extremity   ? 2008--  BILATERAL  ? History of hiatal hernia   ? HTN (hypertension)   ? takes Amlodipine and Ramipril daily  ? Hyperlipidemia   ? takes Zetia and Crestor  daily  ? Joint pain   ? Mitral valve prolapse   ? MILD /   PER PT ASYMPTOMATIC  ? Muscle spasm   ? takes Zanaflex daily as needed  ? Osteoporosis   ? Peripheral neuropathy   ? takes Neurontin daily  ? [place]ipheral vascular disease [place]   ? hx dvt's  ? RSD (reflex sympathetic dystrophy)   ? left wrist and forearm from a fx  ? RSD (reflex sympathetic dystrophy)   ? [place] disease [place]   ? Vertigo   ? takes Meclizine daily   ? ? ?Past Surgical History:  ?Procedure Laterality Date  ? BACK SURGERY    ?  BREAST RECONSTRUCTION WITH PLACEMENT OF TISSUE EXPANDER AND FLEX HD (ACELLULAR HYDRATED DERMIS) Right 01/25/2015  ? Procedure: RIGHT BREAST RECONSTRUCTION WITH PLACEMENT OF IMPLANT AND ACELLULAR DERMAL MATRIX ;  Surgeon: Crissie Reese, MD;  Location: [facility];  Service: Plastics;  Laterality: Right;  ? BREAST RECONSTRUCTION WITH PLACEMENT OF TISSUE EXPANDER AND FLEX HD (ACELLULAR HYDRATED DERMIS) Left 07/05/2015  ? Procedure: LEFT BREAST RECONSTRUCTION WITH PLACEMENT OF TISSUE EXPANDER AND  ACELLULAR DERMAL MATRIX ;  Surgeon: Crissie Reese, MD;  Location: [name];  Service: Plastics;  Laterality: Left;  ? COLONOSCOPY    ? ESOPHAGOGASTRODUODENOSCOPY    ? eye lid surgery Bilateral   ? FOOT SURGERY Bilateral 1996  ? MORTON'S NEUROMA  ? INNER EAR SURGERY Right   ? LUMBAR DISC SURGERY    ? MASTECTOMY Right 01/25/2015  ? nipple sparing   ? NASAL SINUS  SURGERY    ? NIPPLE SPARING MASTECTOMY/SENTINAL LYMPH NODE BIOPSY/RECONSTRUCTION/PLACEMENT OF TISSUE EXPANDER Left 07/05/2015  ? Procedure: LEFT NIPPLE SPARING MASTECTOMY WITH SENTINAL LYMPH NODE BIOPSY ;  Surgeon: [name], MD;  Location: [facility];  Service: General;  Laterality: Left;  ? OPEN REDUCTION INTERNAL FIXATION (ORIF) DISTAL RADIAL FRACTURE Right 08/09/2018  ? Procedure: OPEN REDUCTION INTERNAL FIXATION (ORIF) DISTAL RADIAL FRACTURE;  Surgeon: Milly Jakob, MD;  Location: [facility];  Service: Orthopedics;  Laterality: Right;  ? ORIF LEFT WRIST FX'S/  LEFT CARPAL TUNNEL RELEASE  1999  ? POSTERIOR LUMBAR FUSION  01-30-2009  ? L4--5 Laminectmy w/ decompression/  bilateral L4--5 microdiskectomy and fusion  ? RADIOACTIVE SEED GUIDED PARTIAL MASTECTOMY WITH AXILLARY SENTINEL LYMPH NODE BIOPSY Right 09/13/2014  ? Procedure: RADIOACTIVE SEED GUIDED PARTIAL MASTECTOMY WITH AXILLARY SENTINEL LYMPH NODE BIOPSY;  Surgeon: [name], MD;  Location: [facility];  Service: General;  Laterality: Right;  ? RE-EXCISION OF BREAST LUMPECTOMY Right 09/29/2014  ? Procedure: RE-EXCISION OF BREAST LUMPECTOMY;  Surgeon: [name], MD;  Location: [facility];  Service: General;  Laterality: Right;  ? RIGHT CARPAL TUNNEL RELEASE/  TRIGGER RELEASE RIGHT MIDDLE FINGER  06-18-2006  ? RIGHT INFERIOR PARATHYROIDECTOMY  04-07-2006  ? SIMPLE MASTECTOMY WITH AXILLARY SENTINEL NODE BIOPSY Right 01/25/2015  ? Procedure: RIGHT NIPPLE SPARING MASTECTOMY;  Surgeon: [name], MD;  Location: [facility];  Service: General;  Laterality: Right;  ? SPINAL CORD STIMULATOR IMPLANT  2013  ? TONSILLECTOMY  as child  ? ? ? reports that she has been smoking cigarettes. She has a 6.25 pack-year smoking history. She has never used smokeless tobacco. She reports current alcohol use. She reports that she does not use drugs. ? ?Allergies  ?Allergen Reactions  ? Carbocaine [Mepivacaine Hcl] Hives and Swelling  ?  Penicillins Nausea And Vomiting, Swelling and Rash  ?  Has patient had a PCN reaction causing immediate rash, facial/tongue/throat swelling, SOB or lightheadedness with hypotension: Yes ?Has patient had a PCN reaction causing severe rash involving mucus membranes or skin necrosis: No ?Has patient had a PCN reaction that required hospitalization No ?Has patient had a PCN reaction occurring within the last 10 years: Yes ?If all of the above answers are "NO", then may proceed with Cephalosporin use.  ? Sulfa Antibiotics Nausea And Vomiting, Swelling, Rash and Other (See Comments)  ?  Headache ?  ? Amlodipine Other (See Comments)  ?  Unspecified  ? Latex Rash  ?  Reaction to gloves and bandaids (paper tape is ok)  ? ? ?Family History  ?Problem Relation Age of Onset  ?  Heart murmur Mother   ? Cancer Mother 86  ?     carcinoid tumor   ? Hypertension Mother   ? Heart murmur Sister   ?     x2  ? Cancer Maternal Aunt   ?     lung cancer  ? Cancer Paternal Aunt   ?     breast cancer   ? Cancer Maternal Aunt   ?     gastric cancer   ? Cancer Cousin   ?     lung cancer  ? Cancer Cousin   ?     bone cancer   ? Heart attack Maternal Uncle   ? Heart attack Paternal Uncle   ? Hypertension Son   ? Stroke Neg Hx   ? ? ?Prior to Admission medications   ?Medication Sig Start Date End Date Taking? Authorizing Provider  ?ALPRAZolam (XANAX) 0.5 MG tablet Take 0.5-1 mg by mouth 2 (two) times daily as needed for anxiety. Take 1 tablet (0.5 mg) by mouth if needed during the day, and 2 tablets (1 mg) if needed at bedtime    [provider]  ?anastrozole (ARIMIDEX) 1 MG tablet TAKE 1 TABLET BY MOUTH EVERY DAY 06/01/21   Truitt Merle, MD  ?Ascorbic Acid (VITAMIN C) 1000 MG tablet Take 1,000 mg by mouth daily.    [provider]  ?calcium carbonate (OS-CAL) 600 MG TABS tablet Take 600 mg by mouth daily with breakfast.    [provider]  ?Cholecalciferol (VITAMIN D) 50 MCG (2000 UT) tablet Take 2,000 Units by mouth daily.     [provider]  ?cyclobenzaprine (FLEXERIL) 10 MG tablet Take 10 mg by mouth 3 (three) times daily as needed for muscle spasms.    [provider]  ?ezetimibe (ZETIA) 10 MG tablet T

## 2021-07-08 ENCOUNTER — Inpatient Hospital Stay (HOSPITAL_COMMUNITY): Payer: PPO

## 2021-07-08 DIAGNOSIS — R6510 Systemic inflammatory response syndrome (SIRS) of non-infectious origin without acute organ dysfunction: Secondary | ICD-10-CM | POA: Diagnosis not present

## 2021-07-08 DIAGNOSIS — R609 Edema, unspecified: Secondary | ICD-10-CM

## 2021-07-08 DIAGNOSIS — E440 Moderate protein-calorie malnutrition: Secondary | ICD-10-CM | POA: Insufficient documentation

## 2021-07-08 LAB — CBC WITH DIFFERENTIAL/PLATELET
Abs Immature Granulocytes: 0.07 10*3/uL (ref 0.00–0.07)
Basophils Absolute: 0 10*3/uL (ref 0.0–0.1)
Basophils Relative: 0 %
Eosinophils Absolute: 0.1 10*3/uL (ref 0.0–0.5)
Eosinophils Relative: 0 %
HCT: 31.7 % — ABNORMAL LOW (ref 36.0–46.0)
Hemoglobin: 10.4 g/dL — ABNORMAL LOW (ref 12.0–15.0)
Immature Granulocytes: 1 %
Lymphocytes Relative: 12 %
Lymphs Abs: 1.7 10*3/uL (ref 0.7–4.0)
MCH: 32 pg (ref 26.0–34.0)
MCHC: 32.8 g/dL (ref 30.0–36.0)
MCV: 97.5 fL (ref 80.0–100.0)
Monocytes Absolute: 0.7 10*3/uL (ref 0.1–1.0)
Monocytes Relative: 5 %
Neutro Abs: 11.5 10*3/uL — ABNORMAL HIGH (ref 1.7–7.7)
Neutrophils Relative %: 82 %
Platelets: 150 10*3/uL (ref 150–400)
RBC: 3.25 MIL/uL — ABNORMAL LOW (ref 3.87–5.11)
RDW: 13.6 % (ref 11.5–15.5)
WBC: 14.1 10*3/uL — ABNORMAL HIGH (ref 4.0–10.5)
nRBC: 0 % (ref 0.0–0.2)

## 2021-07-08 LAB — RETICULOCYTES
Immature Retic Fract: 18.5 % — ABNORMAL HIGH (ref 2.3–15.9)
RBC.: 3.33 MIL/uL — ABNORMAL LOW (ref 3.87–5.11)
Retic Count, Absolute: 34 10*3/uL (ref 19.0–186.0)
Retic Ct Pct: 1 % (ref 0.4–3.1)

## 2021-07-08 LAB — COMPREHENSIVE METABOLIC PANEL
ALT: 36 U/L (ref 0–44)
AST: 43 U/L — ABNORMAL HIGH (ref 15–41)
Albumin: 2.8 g/dL — ABNORMAL LOW (ref 3.5–5.0)
Alkaline Phosphatase: 55 U/L (ref 38–126)
Anion gap: 7 (ref 5–15)
BUN: 18 mg/dL (ref 8–23)
CO2: 20 mmol/L — ABNORMAL LOW (ref 22–32)
Calcium: 8.4 mg/dL — ABNORMAL LOW (ref 8.9–10.3)
Chloride: 114 mmol/L — ABNORMAL HIGH (ref 98–111)
Creatinine, Ser: 1.53 mg/dL — ABNORMAL HIGH (ref 0.44–1.00)
GFR, Estimated: 36 mL/min — ABNORMAL LOW (ref >60)
Glucose, Bld: 75 mg/dL (ref 70–99)
Potassium: 4.2 mmol/L (ref 3.5–5.1)
Sodium: 141 mmol/L (ref 135–145)
Total Bilirubin: 0.4 mg/dL (ref 0.3–1.2)
Total Protein: 6.1 g/dL — ABNORMAL LOW (ref 6.5–8.1)

## 2021-07-08 LAB — VITAMIN B12: Vitamin B-12: 399 pg/mL (ref 180–914)

## 2021-07-08 LAB — IRON AND TIBC
Iron: 18 ug/dL — ABNORMAL LOW (ref 28–170)
Saturation Ratios: 8 % — ABNORMAL LOW (ref 10.4–31.8)
TIBC: 220 ug/dL — ABNORMAL LOW (ref 250–450)
UIBC: 202 ug/dL

## 2021-07-08 LAB — MRSA NEXT GEN BY PCR, NASAL: MRSA by PCR Next Gen: NOT DETECTED

## 2021-07-08 LAB — MAGNESIUM: Magnesium: 1.5 mg/dL — ABNORMAL LOW (ref 1.7–2.4)

## 2021-07-08 LAB — PROCALCITONIN: Procalcitonin: 7.76 ng/mL

## 2021-07-08 MED ORDER — ALPRAZOLAM 0.25 MG PO TABS
0.2500 mg | ORAL_TABLET | Freq: Two times a day (BID) | ORAL | Status: DC | PRN
  Administered 2021-07-08 – 2021-07-09 (×2): 0.25 mg via ORAL
  Filled 2021-07-08 (×2): qty 1

## 2021-07-08 MED ORDER — GABAPENTIN 600 MG PO TABS
300.0000 mg | ORAL_TABLET | Freq: Three times a day (TID) | ORAL | Status: DC
  Administered 2021-07-08 – 2021-07-09 (×2): 300 mg via ORAL
  Filled 2021-07-08 (×2): qty 1

## 2021-07-08 MED ORDER — ACETAMINOPHEN 325 MG PO TABS: 650.0000 mg | ORAL_TABLET | Freq: Four times a day (QID) | ORAL | Status: DC | PRN

## 2021-07-08 MED ORDER — ONDANSETRON HCL 4 MG/2ML IJ SOLN
4.0000 mg | Freq: Four times a day (QID) | INTRAMUSCULAR | Status: DC | PRN
Start: 2021-07-08 — End: 2021-07-09

## 2021-07-08 MED ORDER — EZETIMIBE 10 MG PO TABS
10.0000 mg | ORAL_TABLET | Freq: Every day | ORAL | Status: DC
  Administered 2021-07-08: 10 mg via ORAL
  Filled 2021-07-08: qty 1

## 2021-07-08 MED ORDER — ACETAMINOPHEN 650 MG RE SUPP
650.0000 mg | Freq: Four times a day (QID) | RECTAL | Status: DC | PRN
Start: 2021-07-08 — End: 2021-07-09

## 2021-07-08 MED ORDER — ONDANSETRON HCL 4 MG PO TABS: 4.0000 mg | ORAL_TABLET | Freq: Four times a day (QID) | ORAL | Status: DC | PRN

## 2021-07-08 MED ORDER — CYCLOBENZAPRINE HCL 10 MG PO TABS: 5.0000 mg | ORAL_TABLET | Freq: Three times a day (TID) | ORAL | Status: DC | PRN

## 2021-07-08 MED ORDER — MAGNESIUM SULFATE 2 GM/50ML IV SOLN
2.0000 g | Freq: Once | INTRAVENOUS | Status: AC
  Administered 2021-07-08: 2 g via INTRAVENOUS
  Filled 2021-07-08: qty 50

## 2021-07-08 MED ORDER — CYANOCOBALAMIN 1000 MCG/ML IJ SOLN
1000.0000 ug | Freq: Once | INTRAMUSCULAR | Status: AC
  Administered 2021-07-08: 1000 ug via SUBCUTANEOUS
  Filled 2021-07-08: qty 1

## 2021-07-08 MED ORDER — VANCOMYCIN HCL 500 MG/100ML IV SOLN
500.0000 mg | Freq: Every day | INTRAVENOUS | Status: DC
  Administered 2021-07-08 – 2021-07-09 (×2): 500 mg via INTRAVENOUS
  Filled 2021-07-08 (×2): qty 100

## 2021-07-08 MED ORDER — FERROUS SULFATE 325 (65 FE) MG PO TABS
325.0000 mg | ORAL_TABLET | Freq: Two times a day (BID) | ORAL | Status: DC
  Administered 2021-07-08 – 2021-07-09 (×2): 325 mg via ORAL
  Filled 2021-07-08: qty 1

## 2021-07-08 MED ORDER — BREXPIPRAZOLE 1 MG PO TABS
1.0000 mg | ORAL_TABLET | Freq: Every day | ORAL | Status: DC
  Administered 2021-07-08 – 2021-07-09 (×2): 1 mg via ORAL
  Filled 2021-07-08 (×2): qty 1

## 2021-07-08 MED ORDER — SODIUM CHLORIDE 0.9 % IV SOLN: INTRAVENOUS | Status: DC

## 2021-07-08 MED ORDER — VENLAFAXINE HCL ER 150 MG PO CP24
150.0000 mg | ORAL_CAPSULE | Freq: Two times a day (BID) | ORAL | Status: DC
  Administered 2021-07-08 – 2021-07-09 (×2): 150 mg via ORAL
  Filled 2021-07-08 (×2): qty 1

## 2021-07-08 NOTE — Plan of Care (Signed)
  Problem: Education: Goal: Knowledge of General Education information will improve Description Including pain rating scale, medication(s)/side effects and non-pharmacologic comfort measures Outcome: Progressing   

## 2021-07-08 NOTE — Progress Notes (Signed)
Initial Nutrition Assessment ? ?DOCUMENTATION CODES:  ?Non-severe (moderate) malnutrition in context of chronic illness ? ?INTERVENTION:  ?Liberalize diet to regular to promote PO intake ?Monitor intake for adequacy  ? ?NUTRITION DIAGNOSIS:  ?Moderate Malnutrition related to chronic illness (breast cancer) as evidenced by moderate muscle depletion, mild fat depletion. ? ?GOAL:  ?Patient will meet greater than or equal to 90% of their needs ? ?MONITOR:  ?PO intake, Supplement acceptance, Weight trends, I & O's ? ?REASON FOR ASSESSMENT:  ?Malnutrition Screening Tool ?  ? ?ASSESSMENT:  ?71 y.o. female with history of breast cancer in remission, osteoporosis, HLD, HTN, CKD3, and PVD presented to ED with worsening confusion and a fever. In ED, pt was hypotensive and tachycardic, admitted with SIRS.   ? ?Pt resting in bed at the time of visit, RN administering medications. Pt denies a poor appetite. States weight loss has occurred, but it was over several years starting with her cancer dx. 4.9% weight loss noted x ~3 months (1/26-4/9) which is not severe but concerning due to advanced age and comorbidities. ? ?Discussed nutrition supplements with pt. Declines all at this time. States that she will not drink them. Also declines snacks and carnation instant breakfast. Reports she will be going home tomorrow and does not need anything at this time. Will liberalize diet to allow for more dining choices.   ? ?Nutritionally Relevant Medications: ?Continuous Infusions: ? sodium chloride 100 mL/hr at 07/08/21 0805  ? aztreonam 1 g (07/08/21 0458)  ? vancomycin    ? ?PRN Meds: ondansetron ? ?Labs Reviewed: ?Creatinine 1.53 ?Mg 1.5  ? ?NUTRITION - FOCUSED PHYSICAL EXAM: ?Flowsheet Row Most Recent Value  ?Orbital Region No depletion  ?Upper Arm Region Mild depletion  ?Thoracic and Lumbar Region Mild depletion  ?Buccal Region Mild depletion  ?Temple Region No depletion  ?Clavicle Bone Region No depletion  ?Clavicle and Acromion Bone  Region Mild depletion  ?Scapular Bone Region Mild depletion  ?Dorsal Hand Mild depletion  ?Patellar Region Moderate depletion  ?Anterior Thigh Region Moderate depletion  ?Posterior Calf Region Mild depletion  ?Edema (RD Assessment) None  ?Hair Reviewed  ?Eyes Reviewed  ?Mouth Reviewed  ?Skin Reviewed  ?Nails Reviewed  ? ? ?Diet Order:   ?Diet Order   ? ?       ?  Diet Heart Room service appropriate? Yes; Fluid consistency: Thin  Diet effective now       ?  ? ?  ?  ? ?  ? ? ?EDUCATION NEEDS:  ?Education needs have been addressed ? ?Skin:  Skin Assessment: Reviewed RN Assessment ? ?Last BM:  4/9 - type 7 ? ?Height:  ?Ht Readings from Last 1 Encounters:  ?07/07/21 '5\' 2"'$  (1.575 m)  ? ? ?Weight:  ?Wt Readings from Last 1 Encounters:  ?07/07/21 56.2 kg  ? ? ?Ideal Body Weight:  50 kg ? ?BMI:  Body mass index is 22.68 kg/m?. ? ?Estimated Nutritional Needs:  ?Kcal:  1600-1800 kcal/d ?Protein:  75-90 g/d ?Fluid:  1.8-2L/d ? ? ?Ranell Patrick, RD, LDN ?Clinical Dietitian ?RD pager # available in [place]  ?After hours/weekend pager # available in [place] ?

## 2021-07-08 NOTE — Progress Notes (Addendum)
Pharmacy Antibiotic Note ? ?Connie Bennett is a 71 y.o. female admitted on 07/06/2021 with sepsis of unknown source. Patient with hx penicillin allergy with swelling.  Pharmacy has been consulted for Vancomycin/Aztreonam dosing.   ? ?Renal function, fever, and WBC improving. Received vancomycin 1g load on 4/9. ? ?4/10 Vancomycin '500mg'$  Q 24 hr ?Scr used: 1.5 mg/dL ?Weight: 56.2 kg ?Vd coeff: 0.72 L/kg ?Est AUC: 464 ? ?Plan: ?Change to Vancomycin 500 mg IV q24h ?Continue Aztreonam 1g IV q8h ?Monitor cultures/MRSA, clinical status, renal function, vancomycin level ?Narrow abx as able and f/u duration  ? ? ?Height: '5\' 2"'$  (157.5 cm) ?Weight: 56.2 kg (124 lb) ?IBW/kg (Calculated) : 50.1 ? ?Temp (24hrs), Avg:99 ?F (37.2 ?C), Min:98.8 ?F (37.1 ?C), Max:99.4 ?F (37.4 ?C) ? ?Recent Labs  ?Lab 07/06/21 ?2249 07/07/21 ?0881 07/07/21 ?1031 07/08/21 ?5945  ?WBC 23.6*  --  19.9* 14.1*  ?CREATININE 2.23*  --  2.23* 1.53*  ?LATICACIDVEN 1.2 1.1  --   --   ? ?  ?Estimated Creatinine Clearance: 26.3 mL/min (A) (by C-G formula based on SCr of 1.53 mg/dL (H)).   ? ?Allergies  ?Allergen Reactions  ? Carbocaine [Mepivacaine Hcl] Hives and Swelling  ? Penicillins Nausea And Vomiting, Swelling and Rash  ?  Has patient had a PCN reaction causing immediate rash, facial/tongue/throat swelling, SOB or lightheadedness with hypotension: Yes ?Has patient had a PCN reaction causing severe rash involving mucus membranes or skin necrosis: No ?Has patient had a PCN reaction that required hospitalization No ?Has patient had a PCN reaction occurring within the last 10 years: Yes ?If all of the above answers are "NO", then may proceed with Cephalosporin use.  ? Sulfa Antibiotics Nausea And Vomiting, Swelling, Rash and Other (See Comments)  ?  Headache ?  ? Latex Rash  ?  Reaction to gloves and bandaids (paper tape is ok)  ? ? ?Antimicrobials: ?Aztreonam 4/9>> ?MTZ 4/9 ?Vanc 4/9>> ? ?Microbiology  ?4/8  Bcx: ngtd  ?4/10 MRSA: pend  ? ?ADDENDUM 12:10:  Aztreonam Dc'd by MD  ? ?Benetta Spar, PharmD, BCPS, BCCP ?Clinical Pharmacist ? ?Please check AMION for all [place] phone numbers ?After 10:00 PM, call [place] [phone] ? ?

## 2021-07-08 NOTE — Progress Notes (Signed)
Lower extremity venous has been completed.  ? ?Preliminary results in CV Proc.  ? ?Maryn Freelove Cahterine Heinzel ?07/08/2021 1:04 PM    ?

## 2021-07-08 NOTE — Plan of Care (Signed)
  Problem: Activity: Goal: Risk for activity intolerance will decrease Outcome: Progressing   

## 2021-07-08 NOTE — Progress Notes (Signed)
?  Progress Note ?Patient: Connie Bennett OZD:[id number] DOB: Feb 10, 1951 DOA: 07/06/2021  ?DOS: the patient was seen and examined on 07/08/2021 ? ?Brief hospital course: ?Connie Bennett is a 71 y.o. female with history of breast cancer in remission, mitral valve prolapse, hypertension, prior history of DVT was brought to the ER after patient has been found that patient was getting increasingly confused since yesterday afternoon.   ?Found to have hyperkalemia and source of sepsis.  Currently on IV antibiotic. ? ?Assessment and Plan: ?SIRS present on admission. ?Presents with, fever, tachycardia tachypnea. ?Source for infection is most likely cellulitis of the left leg based on the examination. ?We will check MRSA PCR and continue with IV vancomycin.  Discontinue aztreonam. ?Follow-up on blood cultures. ?Currently receiving IV fluid. ? ?Acute kidney injury on chronic kidney disease stage IIIa. ?Baseline serum creatinine around 1.3.  On admission serum creatinine has increased to 2.2. ?We will continue with IV fluids and monitor renal function. ? ?Acute encephalopathy most likely metabolic. ?In the setting of AKI. ?Patient's home medications are on hold. ?We will resume some of them. ? ?HTN. ?Blood pressure stable.  Monitor. ? ?Mitral valve prolapse history. ?Follow-up on cultures. ? ?Diarrhea. ?Patient did have diarrhea prior to admission although currently resolved. ?Patient had diarrhea for last 24 hours after receiving Kayexalate. ? ?Subjective: No nausea no vomiting no fever no chills.  Cough improving.  Per RN significant anxious. ? ?Physical Exam: ?Vitals:  ? 07/07/21 2008 07/08/21 0015 07/08/21 0508 07/08/21 1340  ?BP: 137/65 (!) 155/87 (!) 159/79 (!) 157/70  ?Pulse: 77 77 81 84  ?Resp: '16 20 20 18  '$ ?Temp: 98.9 ?F (37.2 ?C) 99 ?F (37.2 ?C) 99 ?F (37.2 ?C) 98.9 ?F (37.2 ?C)  ?TempSrc: Oral Oral Oral Oral  ?SpO2: 94% 100% 95% 99%  ?Weight:      ?Height:      ? ?General: Appear in mild distress; no visible Abnormal  Neck Mass Or lumps, Conjunctiva normal ?Cardiovascular: S1 and S2 Present, aortic systolic  Murmur, ?Respiratory: good respiratory effort, Bilateral Air entry present and faint left-sided crackles, no wheezes ?Abdomen: Bowel Sound present, Non tender ?Extremities: no Pedal edema ?Neurology: alert and oriented to time, place, and person ?Gait not checked due to patient safety concerns  ? ?Data Reviewed: ?I have Reviewed nursing notes, Vitals, and Lab results since pt's last encounter. Pertinent lab results CBC and BMP ?I have ordered test including CBC and BMP   ? ?Family Communication: No family present. ? ?Disposition: ?Status is: Inpatient ?Remains inpatient appropriate because: Requiring IV antibiotics and further work-up for SIRS. ? ?Author: ?Berle Mull, MD ?07/08/2021 8:20 PM ? ?For on call review [email or web address]. ?

## 2021-07-09 LAB — CBC WITH DIFFERENTIAL/PLATELET
Abs Immature Granulocytes: 0.04 10*3/uL (ref 0.00–0.07)
Basophils Absolute: 0 10*3/uL (ref 0.0–0.1)
Basophils Relative: 0 %
Eosinophils Absolute: 0 10*3/uL (ref 0.0–0.5)
Eosinophils Relative: 0 %
HCT: 31.4 % — ABNORMAL LOW (ref 36.0–46.0)
Hemoglobin: 10.8 g/dL — ABNORMAL LOW (ref 12.0–15.0)
Immature Granulocytes: 0 %
Lymphocytes Relative: 15 %
Lymphs Abs: 1.8 10*3/uL (ref 0.7–4.0)
MCH: 32.1 pg (ref 26.0–34.0)
MCHC: 34.4 g/dL (ref 30.0–36.0)
MCV: 93.5 fL (ref 80.0–100.0)
Monocytes Absolute: 0.6 10*3/uL (ref 0.1–1.0)
Monocytes Relative: 6 %
Neutro Abs: 9.2 10*3/uL — ABNORMAL HIGH (ref 1.7–7.7)
Neutrophils Relative %: 79 %
Platelets: 172 10*3/uL (ref 150–400)
RBC: 3.36 MIL/uL — ABNORMAL LOW (ref 3.87–5.11)
RDW: 13.4 % (ref 11.5–15.5)
WBC: 11.7 10*3/uL — ABNORMAL HIGH (ref 4.0–10.5)
nRBC: 0 % (ref 0.0–0.2)

## 2021-07-09 LAB — BASIC METABOLIC PANEL
Anion gap: 9 (ref 5–15)
BUN: 13 mg/dL (ref 8–23)
CO2: 20 mmol/L — ABNORMAL LOW (ref 22–32)
Calcium: 8.3 mg/dL — ABNORMAL LOW (ref 8.9–10.3)
Chloride: 112 mmol/L — ABNORMAL HIGH (ref 98–111)
Creatinine, Ser: 1.15 mg/dL — ABNORMAL HIGH (ref 0.44–1.00)
GFR, Estimated: 51 mL/min — ABNORMAL LOW (ref >60)
Glucose, Bld: 71 mg/dL (ref 70–99)
Potassium: 3.2 mmol/L — ABNORMAL LOW (ref 3.5–5.1)
Sodium: 141 mmol/L (ref 135–145)

## 2021-07-09 LAB — PROCALCITONIN: Procalcitonin: 4.54 ng/mL

## 2021-07-09 MED ORDER — DOXYCYCLINE HYCLATE 100 MG PO TABS: 100.0000 mg | ORAL_TABLET | Freq: Two times a day (BID) | ORAL | 0 refills | Status: AC

## 2021-07-09 MED ORDER — B COMPLEX-C PO TABS: 1.0000 | ORAL_TABLET | Freq: Every day | ORAL | 0 refills | Status: AC

## 2021-07-09 MED ORDER — FERROUS SULFATE 325 (65 FE) MG PO TABS: 325.0000 mg | ORAL_TABLET | Freq: Every day | ORAL | Status: DC

## 2021-07-09 MED ORDER — ENOXAPARIN SODIUM 40 MG/0.4ML IJ SOSY
40.0000 mg | PREFILLED_SYRINGE | Freq: Every day | INTRAMUSCULAR | Status: DC
  Administered 2021-07-09: 40 mg via SUBCUTANEOUS
  Filled 2021-07-09: qty 0.4

## 2021-07-09 MED ORDER — B COMPLEX-C PO TABS
1.0000 | ORAL_TABLET | Freq: Every day | ORAL | Status: DC
  Filled 2021-07-09: qty 1

## 2021-07-09 MED ORDER — FERROUS SULFATE 325 (65 FE) MG PO TABS: 325.0000 mg | ORAL_TABLET | Freq: Every day | ORAL | 0 refills | Status: AC

## 2021-07-09 MED ORDER — DOXYCYCLINE HYCLATE 100 MG PO TABS
100.0000 mg | ORAL_TABLET | Freq: Two times a day (BID) | ORAL | Status: DC
  Administered 2021-07-09: 100 mg via ORAL
  Filled 2021-07-09: qty 1

## 2021-07-09 MED ORDER — VITAMIN B-12 1000 MCG PO TABS: 1000.0000 ug | ORAL_TABLET | Freq: Every day | ORAL | Status: DC

## 2021-07-09 MED ORDER — POTASSIUM CHLORIDE CRYS ER 20 MEQ PO TBCR
40.0000 meq | EXTENDED_RELEASE_TABLET | Freq: Once | ORAL | Status: AC
  Administered 2021-07-09: 40 meq via ORAL
  Filled 2021-07-09: qty 2

## 2021-07-09 MED ORDER — GABAPENTIN 50 MG PO TABS: 300.0000 mg | ORAL_TABLET | Freq: Three times a day (TID) | ORAL | 0 refills | Status: DC

## 2021-07-09 NOTE — TOC Transition Note (Signed)
Transition of Care (TOC) - CM/SW Discharge Note ? ? ?Patient Details  ?Name: Connie Bennett ?MRN: [id number] ?Date of Birth: [birth year] ? ?Transition of Care (TOC) CM/SW Contact:  ?Pollie Friar, RN ?Phone Number: ?07/09/2021, 10:25 AM ? ? ?Clinical Narrative:    ?Patient is discharging home with self care. No needs per TOC.  ? ? ?Final next level of care: Home/Self Care ?Barriers to Discharge: No Barriers Identified ? ? ?Patient Goals and CMS Choice ?  ?  ?  ? ?Discharge Placement ?  ?           ?  ?  ?  ?  ? ?Discharge Plan and Services ?  ?  ?           ?  ?  ?  ?  ?  ?  ?  ?  ?  ?  ? ?Social Determinants of Health (SDOH) Interventions ?  ? ? ?Readmission Risk Interventions ?   ? View : No data to display.  ?  ?  ?  ? ? ? ? ? ?

## 2021-07-10 NOTE — Discharge Summary (Signed)
?Physician Discharge Summary ?  ?Patient: Connie Bennett MRN: [id number] DOB: [birth year]  ?Admit date:     07/06/2021  ?Discharge date: 07/09/2021  ?Discharge Physician: Berle Mull  ?PCP: Mayra Neer, MD ? ?Recommendations at discharge: ?Follow-up with PCP as recommended. ? ?Discharge Diagnoses: ?Principal Problem: ?  SIRS (systemic inflammatory response syndrome) (HCC) ?Active Problems: ?  Essential hypertension ?  Mitral valve prolapse ?  Breast cancer of upper-inner quadrant of left female breast ([place]) ?  Malnutrition of moderate degree ? ?Hospital Course: ?AIDE WOJNAR is a 71 y.o. female with history of breast cancer in remission, mitral valve prolapse, hypertension, prior history of DVT was brought to the ER after patient has been found that patient was getting increasingly confused since yesterday afternoon.   ?Found to have sepsis related to cellulitis ? ?Assessment and Plan: ?Sepsis due to cellulitis present on admission ?Met SIRS criteria on admission with, fever, tachycardia tachypnea. ?Source most likely cellulitis based on the examination. ?MRSA PCR is negative. ?We will continue with oral doxycycline on discharge as the patient has some reaction to penicillin in the past and does not want to take cephalexin. ?Leukocytosis improving.  Blood cultures negative.  Cellulitis improving. ? ?Acute kidney injury on chronic kidney disease stage IIIa. ?Hyperkalemia ?Baseline serum creatinine around 1.3.  On admission serum creatinine has increased to 2.2. ?Treated with IV fluids. ?Recommend follow-up with PCP with BMP in 1 week. ?Hyperkalemia treated with Kayexalate with resolution. ? ?Acute encephalopathy most likely metabolic. ?In the setting of AKI and sepsis. ?Significant improvement now close to baseline. ?  ?HTN. ?Blood pressure stable.  Monitor. ?  ?Mitral valve prolapse history. ?Cultures negative. ?  ?Diarrhea. ?Patient did have diarrhea prior to admission although currently resolved. ?Patient  had diarrhea for last 24 hours after receiving Kayexalate. ? ?Consultants: none ?Procedures performed:  ?none ?DISCHARGE MEDICATION: ?Allergies as of 07/09/2021   ? ?   Reactions  ? Carbocaine [mepivacaine Hcl] Hives, Swelling  ? Penicillins Nausea And Vomiting, Swelling, Rash  ? Has patient had a PCN reaction causing immediate rash, facial/tongue/throat swelling, SOB or lightheadedness with hypotension: Yes ?Has patient had a PCN reaction causing severe rash involving mucus membranes or skin necrosis: No ?Has patient had a PCN reaction that required hospitalization No ?Has patient had a PCN reaction occurring within the last 10 years: Yes ?If all of the above answers are "NO", then may proceed with Cephalosporin use.  ? Sulfa Antibiotics Nausea And Vomiting, Swelling, Rash, Other (See Comments)  ? Headache  ? Latex Rash  ? Reaction to gloves and bandaids (paper tape is ok)  ? ?  ? ?  ?Medication List  ?  ? ?STOP taking these medications   ? ?oxyCODONE-acetaminophen 10-325 MG tablet ?Commonly known as: PERCOCET ?  ? ?  ? ?TAKE these medications   ? ?ALPRAZolam 0.5 MG tablet ?Commonly known as: Duanne Moron ?Take 0.5-1 mg by mouth 2 (two) times daily as needed for anxiety. Take 1 tablet (0.5 mg) by mouth if needed during the day, and 2 tablets (1 mg) if needed at bedtime ?  ?anastrozole 1 MG tablet ?Commonly known as: ARIMIDEX ?TAKE 1 TABLET BY MOUTH EVERY DAY ?  ?B-complex with vitamin C tablet ?Take 1 tablet by mouth daily. ?  ?calcium carbonate 600 MG Tabs tablet ?Commonly known as: OS-CAL ?Take 600 mg by mouth daily with breakfast. ?  ?cyclobenzaprine 10 MG tablet ?Commonly known as: FLEXERIL ?Take 10 mg by mouth 3 (three) times daily as needed  for muscle spasms. ?  ?doxycycline 100 MG tablet ?Commonly known as: VIBRA-TABS ?Take 1 tablet (100 mg total) by mouth 2 (two) times daily for 7 days. ?  ?ezetimibe 10 MG tablet ?Commonly known as: ZETIA ?Take 10 mg by mouth at bedtime. ?  ?ferrous sulfate 325 (65 FE) MG  tablet ?Take 1 tablet (325 mg total) by mouth daily with breakfast. ?  ?Fish Oil 1000 MG Caps ?Take by mouth. ?  ?Flax Seed Oil 1000 MG Caps ?Take by mouth. ?  ?furosemide 40 MG tablet ?Commonly known as: LASIX ?Take 40 mg by mouth daily as needed for fluid or edema (leg swelling). Reported on 06/29/2015 ?  ?Gabapentin 50 MG Tabs ?Take 300 mg by mouth 3 (three) times daily. ?What changed:  ?medication strength ?how much to take ?  ?meclizine 25 MG tablet ?Commonly known as: ANTIVERT ?Take 25 mg by mouth 4 (four) times daily as needed. ?  ?ProAir HFA 108 (90 Base) MCG/ACT inhaler ?Generic drug: albuterol ?Inhale 2 puffs into the lungs every 4 (four) hours as needed. ?  ?PROBIOTIC ACIDOPHILUS PO ?Take 1 tablet by mouth every morning. ?  ?ramipril 10 MG capsule ?Commonly known as: ALTACE ?Take 10 mg by mouth daily. ?  ?Rexulti 1 MG Tabs tablet ?Generic drug: brexpiprazole ?Take 1 mg by mouth daily. ?  ?rosuvastatin 20 MG tablet ?Commonly known as: CRESTOR ?Take 20 mg by mouth daily. ?  ?venlafaxine XR 150 MG 24 hr capsule ?Commonly known as: EFFEXOR-XR ?Take 150 mg by mouth 2 (two) times daily. Take 1 capsule (150 mg) by mouth daily with breakfast and lunch ?  ?vitamin C 1000 MG tablet ?Take 1,000 mg by mouth daily. ?  ?Vitamin D 50 MCG (2000 UT) tablet ?Take 2,000 Units by mouth daily. ?  ? ?  ? ? Follow-up Information   ? ? Mayra Neer, MD. Schedule an appointment as soon as possible for a visit in 1 week(s).   ?Specialty: Family Medicine ?Why: with CBC and BMP ?Contact information: ?[address] ?[place] [address] ?[phone] ? ? ?  ?  ? ?  ?  ? ?  ? ?Disposition: Home ?Diet recommendation: Cardiac diet ? ?Discharge Exam: ?Filed Weights  ? 07/06/21 2234 07/07/21 1624  ?Weight: 57.1 kg 56.2 kg  ? ?General: Appear in no distress; no visible Abnormal Neck Mass Or lumps, Conjunctiva normal ?Cardiovascular: S1 and S2 Present, aortic systolic  Murmur, ?Respiratory: good respiratory effort,  Bilateral Air entry present and CTA, no Crackles, no wheezes ?Abdomen: Bowel Sound present Non tender  ?Extremities: no Pedal edema, improving left leg cellulitis ?Neurology: alert and oriented to time, place, and person ?Gait not checked due to patient safety concerns  ? ?Condition at discharge: good ? ?The results of significant diagnostics from this hospitalization (including imaging, microbiology, ancillary and laboratory) are listed below for reference.  ? ?Imaging Studies: ?DG Chest 2 View ? ?Result Date: 07/06/2021 ?CLINICAL DATA:  Possible sepsis EXAM: CHEST - 2 VIEW COMPARISON:  09/28/2015 FINDINGS: Cardiac shadow is at the upper limits of normal in size. Aortic calcifications are seen. The lungs are well aerated bilaterally and clear. Spinal stimulator is again noted. No bony abnormality is seen. IMPRESSION: No acute abnormality noted. Electronically Signed   By: Inez Catalina M.D.   On: 07/06/2021 23:36  ? ?DG Abd 2 Views ? ?Result Date: 07/08/2021 ?CLINICAL DATA:  Abdominal pain. EXAM: ABDOMEN - 2 VIEW COMPARISON:  None. FINDINGS: The bowel gas pattern is normal.  There is no evidence of free air. No radio-opaque calculi or other significant radiographic abnormality is seen. Status post surgical posterior fusion of L4-5. IMPRESSION: Negative. Electronically Signed   By: Marijo Conception M.D.   On: 07/08/2021 10:00  ? ?VAS [place] LOWER EXTREMITY VENOUS (DVT) ? ?Result Date: 07/08/2021 ? Lower Venous DVT Study Patient Name:  HORACE WISHON  Date of Exam:   07/08/2021 Medical Rec #: [id number]          Accession #:    [id number] Date of Birth: [birth year]           Patient Gender: F Patient Age:   47 years Exam Location:  [facility] Procedure:      VAS [place] LOWER EXTREMITY VENOUS (DVT) Referring Phys: Pat Elicker --------------------------------------------------------------------------------  Indications: Edema.  Comparison Study: no prior Performing Technologist: Archie Patten RVS  Examination Guidelines: A  complete evaluation includes B-mode imaging, spectral Doppler, color Doppler, and power Doppler as needed of all accessible portions of each vessel. Bilateral testing is considered an integral part of a complete exam

## 2021-07-11 LAB — CULTURE, BLOOD (ROUTINE X 2)
Culture: NO GROWTH
Culture: NO GROWTH
Special Requests: ADEQUATE
Special Requests: ADEQUATE

## 2021-07-15 DIAGNOSIS — I959 Hypotension, unspecified: Secondary | ICD-10-CM | POA: Diagnosis not present

## 2021-07-15 DIAGNOSIS — E875 Hyperkalemia: Secondary | ICD-10-CM | POA: Diagnosis not present

## 2021-07-15 DIAGNOSIS — N179 Acute kidney failure, unspecified: Secondary | ICD-10-CM | POA: Diagnosis not present

## 2021-07-15 DIAGNOSIS — R6510 Systemic inflammatory response syndrome (SIRS) of non-infectious origin without acute organ dysfunction: Secondary | ICD-10-CM | POA: Diagnosis not present

## 2021-07-15 DIAGNOSIS — L03116 Cellulitis of left lower limb: Secondary | ICD-10-CM | POA: Diagnosis not present

## 2021-07-15 DIAGNOSIS — D72829 Elevated white blood cell count, unspecified: Secondary | ICD-10-CM | POA: Diagnosis not present

## 2021-07-15 DIAGNOSIS — G9340 Encephalopathy, unspecified: Secondary | ICD-10-CM | POA: Diagnosis not present

## 2021-07-15 DIAGNOSIS — R059 Cough, unspecified: Secondary | ICD-10-CM | POA: Diagnosis not present

## 2021-07-17 DIAGNOSIS — N179 Acute kidney failure, unspecified: Secondary | ICD-10-CM | POA: Diagnosis not present

## 2021-07-17 DIAGNOSIS — L03116 Cellulitis of left lower limb: Secondary | ICD-10-CM | POA: Diagnosis not present

## 2021-07-17 DIAGNOSIS — R059 Cough, unspecified: Secondary | ICD-10-CM | POA: Diagnosis not present

## 2021-07-19 ENCOUNTER — Inpatient Hospital Stay (HOSPITAL_COMMUNITY)
Admission: AD | Admit: 2021-07-19 | Discharge: 2021-07-28 | DRG: 603 | Disposition: A | Discharge status: Home / Self Care | Payer: PPO | Source: Ambulatory Visit | Attending: Internal Medicine | Admitting: Internal Medicine

## 2021-07-19 DIAGNOSIS — Z800 Family history of malignant neoplasm of digestive organs: Secondary | ICD-10-CM

## 2021-07-19 DIAGNOSIS — Z981 Arthrodesis status: Secondary | ICD-10-CM | POA: Diagnosis not present

## 2021-07-19 DIAGNOSIS — N179 Acute kidney failure, unspecified: Secondary | ICD-10-CM

## 2021-07-19 DIAGNOSIS — F17210 Nicotine dependence, cigarettes, uncomplicated: Secondary | ICD-10-CM | POA: Diagnosis not present

## 2021-07-19 DIAGNOSIS — Z79899 Other long term (current) drug therapy: Secondary | ICD-10-CM

## 2021-07-19 DIAGNOSIS — L03116 Cellulitis of left lower limb: Principal | ICD-10-CM | POA: Diagnosis present

## 2021-07-19 DIAGNOSIS — M3500 Sicca syndrome, unspecified: Secondary | ICD-10-CM | POA: Diagnosis not present

## 2021-07-19 DIAGNOSIS — Z888 Allergy status to other drugs, medicaments and biological substances status: Secondary | ICD-10-CM | POA: Diagnosis not present

## 2021-07-19 DIAGNOSIS — Z853 Personal history of malignant neoplasm of breast: Secondary | ICD-10-CM

## 2021-07-19 DIAGNOSIS — Z880 Allergy status to penicillin: Secondary | ICD-10-CM | POA: Diagnosis not present

## 2021-07-19 DIAGNOSIS — E869 Volume depletion, unspecified: Secondary | ICD-10-CM | POA: Diagnosis present

## 2021-07-19 DIAGNOSIS — G8929 Other chronic pain: Secondary | ICD-10-CM | POA: Diagnosis present

## 2021-07-19 DIAGNOSIS — F419 Anxiety disorder, unspecified: Secondary | ICD-10-CM | POA: Diagnosis present

## 2021-07-19 DIAGNOSIS — I739 Peripheral vascular disease, unspecified: Secondary | ICD-10-CM | POA: Diagnosis present

## 2021-07-19 DIAGNOSIS — N1832 Chronic kidney disease, stage 3b: Secondary | ICD-10-CM | POA: Diagnosis present

## 2021-07-19 DIAGNOSIS — Z8249 Family history of ischemic heart disease and other diseases of the circulatory system: Secondary | ICD-10-CM | POA: Diagnosis not present

## 2021-07-19 DIAGNOSIS — F329 Major depressive disorder, single episode, unspecified: Secondary | ICD-10-CM | POA: Diagnosis present

## 2021-07-19 DIAGNOSIS — R609 Edema, unspecified: Secondary | ICD-10-CM | POA: Diagnosis not present

## 2021-07-19 DIAGNOSIS — G629 Polyneuropathy, unspecified: Secondary | ICD-10-CM | POA: Diagnosis present

## 2021-07-19 DIAGNOSIS — Z801 Family history of malignant neoplasm of trachea, bronchus and lung: Secondary | ICD-10-CM

## 2021-07-19 DIAGNOSIS — M79605 Pain in left leg: Secondary | ICD-10-CM | POA: Diagnosis not present

## 2021-07-19 DIAGNOSIS — E782 Mixed hyperlipidemia: Secondary | ICD-10-CM | POA: Diagnosis present

## 2021-07-19 DIAGNOSIS — N1831 Chronic kidney disease, stage 3a: Secondary | ICD-10-CM | POA: Diagnosis present

## 2021-07-19 DIAGNOSIS — M7989 Other specified soft tissue disorders: Secondary | ICD-10-CM | POA: Diagnosis not present

## 2021-07-19 DIAGNOSIS — I129 Hypertensive chronic kidney disease with stage 1 through stage 4 chronic kidney disease, or unspecified chronic kidney disease: Secondary | ICD-10-CM | POA: Diagnosis not present

## 2021-07-19 DIAGNOSIS — I341 Nonrheumatic mitral (valve) prolapse: Secondary | ICD-10-CM | POA: Diagnosis not present

## 2021-07-19 DIAGNOSIS — E876 Hypokalemia: Secondary | ICD-10-CM | POA: Diagnosis not present

## 2021-07-19 DIAGNOSIS — Z9013 Acquired absence of bilateral breasts and nipples: Secondary | ICD-10-CM | POA: Diagnosis not present

## 2021-07-19 DIAGNOSIS — L538 Other specified erythematous conditions: Secondary | ICD-10-CM | POA: Diagnosis not present

## 2021-07-19 DIAGNOSIS — D631 Anemia in chronic kidney disease: Secondary | ICD-10-CM | POA: Diagnosis not present

## 2021-07-19 DIAGNOSIS — M797 Fibromyalgia: Secondary | ICD-10-CM | POA: Diagnosis present

## 2021-07-19 DIAGNOSIS — I890 Lymphedema, not elsewhere classified: Secondary | ICD-10-CM | POA: Diagnosis present

## 2021-07-19 DIAGNOSIS — L0390 Cellulitis, unspecified: Secondary | ICD-10-CM | POA: Diagnosis not present

## 2021-07-19 DIAGNOSIS — Z86718 Personal history of other venous thrombosis and embolism: Secondary | ICD-10-CM

## 2021-07-19 DIAGNOSIS — Z803 Family history of malignant neoplasm of breast: Secondary | ICD-10-CM

## 2021-07-19 DIAGNOSIS — I10 Essential (primary) hypertension: Secondary | ICD-10-CM | POA: Diagnosis not present

## 2021-07-19 DIAGNOSIS — Z91040 Latex allergy status: Secondary | ICD-10-CM

## 2021-07-19 DIAGNOSIS — M810 Age-related osteoporosis without current pathological fracture: Secondary | ICD-10-CM | POA: Diagnosis present

## 2021-07-19 DIAGNOSIS — I878 Other specified disorders of veins: Secondary | ICD-10-CM | POA: Diagnosis present

## 2021-07-19 DIAGNOSIS — Z86010 Personal history of colonic polyps: Secondary | ICD-10-CM | POA: Diagnosis not present

## 2021-07-20 ENCOUNTER — Encounter (HOSPITAL_COMMUNITY): Payer: Self-pay | Admitting: Internal Medicine

## 2021-07-20 ENCOUNTER — Other Ambulatory Visit: Payer: Self-pay

## 2021-07-20 ENCOUNTER — Inpatient Hospital Stay (HOSPITAL_COMMUNITY): Payer: PPO

## 2021-07-20 DIAGNOSIS — I10 Essential (primary) hypertension: Secondary | ICD-10-CM

## 2021-07-20 DIAGNOSIS — Z853 Personal history of malignant neoplasm of breast: Secondary | ICD-10-CM | POA: Diagnosis not present

## 2021-07-20 DIAGNOSIS — F17210 Nicotine dependence, cigarettes, uncomplicated: Secondary | ICD-10-CM | POA: Diagnosis present

## 2021-07-20 DIAGNOSIS — R609 Edema, unspecified: Secondary | ICD-10-CM | POA: Diagnosis not present

## 2021-07-20 DIAGNOSIS — E782 Mixed hyperlipidemia: Secondary | ICD-10-CM | POA: Diagnosis present

## 2021-07-20 DIAGNOSIS — N1832 Chronic kidney disease, stage 3b: Secondary | ICD-10-CM | POA: Diagnosis present

## 2021-07-20 DIAGNOSIS — Z86010 Personal history of colonic polyps: Secondary | ICD-10-CM | POA: Diagnosis not present

## 2021-07-20 DIAGNOSIS — M79605 Pain in left leg: Secondary | ICD-10-CM | POA: Diagnosis not present

## 2021-07-20 DIAGNOSIS — Z9013 Acquired absence of bilateral breasts and nipples: Secondary | ICD-10-CM | POA: Diagnosis not present

## 2021-07-20 DIAGNOSIS — Z8249 Family history of ischemic heart disease and other diseases of the circulatory system: Secondary | ICD-10-CM | POA: Diagnosis not present

## 2021-07-20 DIAGNOSIS — M810 Age-related osteoporosis without current pathological fracture: Secondary | ICD-10-CM | POA: Diagnosis present

## 2021-07-20 DIAGNOSIS — E876 Hypokalemia: Secondary | ICD-10-CM

## 2021-07-20 DIAGNOSIS — Z86718 Personal history of other venous thrombosis and embolism: Secondary | ICD-10-CM | POA: Diagnosis not present

## 2021-07-20 DIAGNOSIS — L03116 Cellulitis of left lower limb: Secondary | ICD-10-CM | POA: Diagnosis present

## 2021-07-20 DIAGNOSIS — D631 Anemia in chronic kidney disease: Secondary | ICD-10-CM | POA: Diagnosis present

## 2021-07-20 DIAGNOSIS — F329 Major depressive disorder, single episode, unspecified: Secondary | ICD-10-CM | POA: Diagnosis present

## 2021-07-20 DIAGNOSIS — N179 Acute kidney failure, unspecified: Secondary | ICD-10-CM | POA: Diagnosis not present

## 2021-07-20 DIAGNOSIS — Z888 Allergy status to other drugs, medicaments and biological substances status: Secondary | ICD-10-CM | POA: Diagnosis not present

## 2021-07-20 DIAGNOSIS — I341 Nonrheumatic mitral (valve) prolapse: Secondary | ICD-10-CM | POA: Diagnosis present

## 2021-07-20 DIAGNOSIS — I129 Hypertensive chronic kidney disease with stage 1 through stage 4 chronic kidney disease, or unspecified chronic kidney disease: Secondary | ICD-10-CM | POA: Diagnosis present

## 2021-07-20 DIAGNOSIS — N1831 Chronic kidney disease, stage 3a: Secondary | ICD-10-CM | POA: Diagnosis not present

## 2021-07-20 DIAGNOSIS — M3500 Sicca syndrome, unspecified: Secondary | ICD-10-CM | POA: Diagnosis present

## 2021-07-20 DIAGNOSIS — G629 Polyneuropathy, unspecified: Secondary | ICD-10-CM | POA: Diagnosis present

## 2021-07-20 DIAGNOSIS — Z91040 Latex allergy status: Secondary | ICD-10-CM | POA: Diagnosis not present

## 2021-07-20 DIAGNOSIS — Z981 Arthrodesis status: Secondary | ICD-10-CM | POA: Diagnosis not present

## 2021-07-20 DIAGNOSIS — Z880 Allergy status to penicillin: Secondary | ICD-10-CM | POA: Diagnosis not present

## 2021-07-20 DIAGNOSIS — M7989 Other specified soft tissue disorders: Secondary | ICD-10-CM | POA: Diagnosis not present

## 2021-07-20 DIAGNOSIS — E869 Volume depletion, unspecified: Secondary | ICD-10-CM | POA: Diagnosis present

## 2021-07-20 DIAGNOSIS — L538 Other specified erythematous conditions: Secondary | ICD-10-CM | POA: Diagnosis not present

## 2021-07-20 DIAGNOSIS — I739 Peripheral vascular disease, unspecified: Secondary | ICD-10-CM | POA: Diagnosis present

## 2021-07-20 LAB — MAGNESIUM: Magnesium: 2.3 mg/dL (ref 1.7–2.4)

## 2021-07-20 LAB — COMPREHENSIVE METABOLIC PANEL
ALT: 29 U/L (ref 0–44)
AST: 45 U/L — ABNORMAL HIGH (ref 15–41)
Albumin: 3.2 g/dL — ABNORMAL LOW (ref 3.5–5.0)
Alkaline Phosphatase: 73 U/L (ref 38–126)
Anion gap: 6 (ref 5–15)
BUN: 24 mg/dL — ABNORMAL HIGH (ref 8–23)
CO2: 24 mmol/L (ref 22–32)
Calcium: 9.3 mg/dL (ref 8.9–10.3)
Chloride: 108 mmol/L (ref 98–111)
Creatinine, Ser: 1.84 mg/dL — ABNORMAL HIGH (ref 0.44–1.00)
GFR, Estimated: 29 mL/min — ABNORMAL LOW (ref >60)
Glucose, Bld: 101 mg/dL — ABNORMAL HIGH (ref 70–99)
Potassium: 3.3 mmol/L — ABNORMAL LOW (ref 3.5–5.1)
Sodium: 138 mmol/L (ref 135–145)
Total Bilirubin: 0.4 mg/dL (ref 0.3–1.2)
Total Protein: 7.3 g/dL (ref 6.5–8.1)

## 2021-07-20 LAB — CBC WITH DIFFERENTIAL/PLATELET
Abs Immature Granulocytes: 0.06 10*3/uL (ref 0.00–0.07)
Basophils Absolute: 0.1 10*3/uL (ref 0.0–0.1)
Basophils Relative: 0 %
Eosinophils Absolute: 0.3 10*3/uL (ref 0.0–0.5)
Eosinophils Relative: 2 %
HCT: 28.9 % — ABNORMAL LOW (ref 36.0–46.0)
Hemoglobin: 9.4 g/dL — ABNORMAL LOW (ref 12.0–15.0)
Immature Granulocytes: 1 %
Lymphocytes Relative: 16 %
Lymphs Abs: 2 10*3/uL (ref 0.7–4.0)
MCH: 32.8 pg (ref 26.0–34.0)
MCHC: 32.5 g/dL (ref 30.0–36.0)
MCV: 100.7 fL — ABNORMAL HIGH (ref 80.0–100.0)
Monocytes Absolute: 1.3 10*3/uL — ABNORMAL HIGH (ref 0.1–1.0)
Monocytes Relative: 11 %
Neutro Abs: 8.3 10*3/uL — ABNORMAL HIGH (ref 1.7–7.7)
Neutrophils Relative %: 70 %
Platelets: 319 10*3/uL (ref 150–400)
RBC: 2.87 MIL/uL — ABNORMAL LOW (ref 3.87–5.11)
RDW: 13.3 % (ref 11.5–15.5)
WBC: 11.9 10*3/uL — ABNORMAL HIGH (ref 4.0–10.5)
nRBC: 0 % (ref 0.0–0.2)

## 2021-07-20 LAB — LACTIC ACID, PLASMA: Lactic Acid, Venous: 1 mmol/L (ref 0.5–1.9)

## 2021-07-20 LAB — C-REACTIVE PROTEIN: CRP: 7.3 mg/dL — ABNORMAL HIGH (ref <1.0)

## 2021-07-20 MED ORDER — ALPRAZOLAM 0.5 MG PO TABS
0.5000 mg | ORAL_TABLET | Freq: Two times a day (BID) | ORAL | Status: DC | PRN
  Administered 2021-07-21: 0.5 mg via ORAL
  Filled 2021-07-20: qty 1

## 2021-07-20 MED ORDER — POLYETHYLENE GLYCOL 3350 17 G PO PACK: 17.0000 g | PACK | Freq: Every day | ORAL | Status: DC | PRN

## 2021-07-20 MED ORDER — ACETAMINOPHEN 325 MG PO TABS
650.0000 mg | ORAL_TABLET | ORAL | Status: DC | PRN
Start: 2021-07-20 — End: 2021-07-20

## 2021-07-20 MED ORDER — ACETAMINOPHEN 325 MG PO TABS
650.0000 mg | ORAL_TABLET | Freq: Four times a day (QID) | ORAL | Status: DC | PRN
  Administered 2021-07-20 – 2021-07-27 (×8): 650 mg via ORAL
  Filled 2021-07-20 (×8): qty 2

## 2021-07-20 MED ORDER — ALBUTEROL SULFATE (2.5 MG/3ML) 0.083% IN NEBU: 3.0000 mL | INHALATION_SOLUTION | RESPIRATORY_TRACT | Status: DC | PRN

## 2021-07-20 MED ORDER — SODIUM CHLORIDE 0.9 % IV SOLN
INTRAVENOUS | Status: DC
Start: 2021-07-20 — End: 2021-07-20

## 2021-07-20 MED ORDER — HYDRALAZINE HCL 20 MG/ML IJ SOLN: 10.0000 mg | Freq: Four times a day (QID) | INTRAMUSCULAR | Status: DC | PRN

## 2021-07-20 MED ORDER — ENOXAPARIN SODIUM 40 MG/0.4ML IJ SOSY: 40.0000 mg | PREFILLED_SYRINGE | INTRAMUSCULAR | Status: DC

## 2021-07-20 MED ORDER — EZETIMIBE 10 MG PO TABS
10.0000 mg | ORAL_TABLET | Freq: Every day | ORAL | Status: DC
Start: 2021-07-20 — End: 2021-07-28
  Administered 2021-07-20 – 2021-07-27 (×9): 10 mg via ORAL
  Filled 2021-07-20 (×9): qty 1

## 2021-07-20 MED ORDER — ANASTROZOLE 1 MG PO TABS
1.0000 mg | ORAL_TABLET | Freq: Every day | ORAL | Status: DC
  Administered 2021-07-20 – 2021-07-28 (×9): 1 mg via ORAL
  Filled 2021-07-20 (×9): qty 1

## 2021-07-20 MED ORDER — VENLAFAXINE HCL ER 150 MG PO CP24
150.0000 mg | ORAL_CAPSULE | Freq: Two times a day (BID) | ORAL | Status: DC
Start: 2021-07-20 — End: 2021-07-28
  Administered 2021-07-20 – 2021-07-28 (×17): 150 mg via ORAL
  Filled 2021-07-20 (×17): qty 1

## 2021-07-20 MED ORDER — RAMIPRIL 10 MG PO CAPS
10.0000 mg | ORAL_CAPSULE | Freq: Every day | ORAL | Status: DC
  Administered 2021-07-20 – 2021-07-28 (×9): 10 mg via ORAL
  Filled 2021-07-20 (×9): qty 1

## 2021-07-20 MED ORDER — ONDANSETRON HCL 4 MG PO TABS
4.0000 mg | ORAL_TABLET | Freq: Four times a day (QID) | ORAL | Status: DC | PRN
Start: 2021-07-20 — End: 2021-07-28

## 2021-07-20 MED ORDER — OXYCODONE-ACETAMINOPHEN 5-325 MG PO TABS
1.0000 | ORAL_TABLET | Freq: Four times a day (QID) | ORAL | Status: DC | PRN
  Administered 2021-07-20 – 2021-07-28 (×27): 1 via ORAL
  Filled 2021-07-20 (×29): qty 1

## 2021-07-20 MED ORDER — ROSUVASTATIN CALCIUM 20 MG PO TABS
20.0000 mg | ORAL_TABLET | Freq: Every day | ORAL | Status: DC
  Administered 2021-07-20 – 2021-07-28 (×9): 20 mg via ORAL
  Filled 2021-07-20 (×9): qty 1

## 2021-07-20 MED ORDER — FERROUS SULFATE 325 (65 FE) MG PO TABS
325.0000 mg | ORAL_TABLET | Freq: Every day | ORAL | Status: DC
Start: 2021-07-20 — End: 2021-07-28
  Administered 2021-07-20 – 2021-07-28 (×9): 325 mg via ORAL
  Filled 2021-07-20 (×9): qty 1

## 2021-07-20 MED ORDER — ENOXAPARIN SODIUM 30 MG/0.3ML IJ SOSY
30.0000 mg | PREFILLED_SYRINGE | INTRAMUSCULAR | Status: DC
  Administered 2021-07-20 – 2021-07-28 (×9): 30 mg via SUBCUTANEOUS
  Filled 2021-07-20 (×10): qty 0.3

## 2021-07-20 MED ORDER — VANCOMYCIN HCL IN DEXTROSE 1-5 GM/200ML-% IV SOLN: 1000.0000 mg | INTRAVENOUS | Status: DC

## 2021-07-20 MED ORDER — SODIUM CHLORIDE 0.9 % IV SOLN: INTRAVENOUS | Status: AC

## 2021-07-20 MED ORDER — VANCOMYCIN HCL IN DEXTROSE 1-5 GM/200ML-% IV SOLN
1000.0000 mg | INTRAVENOUS | Status: DC
  Administered 2021-07-22: 1000 mg via INTRAVENOUS
  Filled 2021-07-20: qty 200

## 2021-07-20 MED ORDER — CEFEPIME HCL 2 G IJ SOLR
2.0000 g | Freq: Two times a day (BID) | INTRAMUSCULAR | Status: DC
Start: 2021-07-20 — End: 2021-07-20
  Filled 2021-07-20: qty 12.5

## 2021-07-20 MED ORDER — GABAPENTIN 300 MG PO CAPS
300.0000 mg | ORAL_CAPSULE | Freq: Three times a day (TID) | ORAL | Status: DC
  Administered 2021-07-20 – 2021-07-21 (×4): 300 mg via ORAL
  Filled 2021-07-20 (×4): qty 1

## 2021-07-20 MED ORDER — VANCOMYCIN HCL IN DEXTROSE 1-5 GM/200ML-% IV SOLN
1000.0000 mg | Freq: Once | INTRAVENOUS | Status: AC
  Administered 2021-07-20: 1000 mg via INTRAVENOUS
  Filled 2021-07-20: qty 200

## 2021-07-20 MED ORDER — POTASSIUM CHLORIDE CRYS ER 20 MEQ PO TBCR
40.0000 meq | EXTENDED_RELEASE_TABLET | Freq: Once | ORAL | Status: AC
  Administered 2021-07-20: 40 meq via ORAL
  Filled 2021-07-20: qty 2

## 2021-07-20 MED ORDER — CYCLOBENZAPRINE HCL 10 MG PO TABS
10.0000 mg | ORAL_TABLET | Freq: Three times a day (TID) | ORAL | Status: DC | PRN
  Administered 2021-07-20 – 2021-07-27 (×8): 10 mg via ORAL
  Filled 2021-07-20 (×8): qty 1

## 2021-07-20 MED ORDER — ONDANSETRON HCL 4 MG/2ML IJ SOLN: 4.0000 mg | Freq: Four times a day (QID) | INTRAMUSCULAR | Status: DC | PRN

## 2021-07-20 MED ORDER — ACETAMINOPHEN 650 MG RE SUPP: 650.0000 mg | Freq: Four times a day (QID) | RECTAL | Status: DC | PRN

## 2021-07-20 MED ORDER — SODIUM CHLORIDE 0.9 % IV SOLN
2.0000 g | INTRAVENOUS | Status: DC
  Administered 2021-07-20 – 2021-07-22 (×3): 2 g via INTRAVENOUS
  Filled 2021-07-20 (×3): qty 12.5

## 2021-07-20 NOTE — H&P (Signed)
?History and Physical  ? ? ?Patient: Connie Bennett MRN: [id number] DOA: 07/19/2021 ? ?Date of Service: the patient was seen and examined on 07/20/2021 ? ?Patient coming from: Home ? ?Chief Complaint: No chief complaint on file. ? ? ?HPI:  ? ?71 year old female with past medical history of hypertension, hyperlipidemia, Sjogren's disease, depression, left breast cancer (Dx 08/2015 S/P mastectomy), DCIS of the right breast (Dx 07/2014 S/P right mastectomy), osteoporosis, anemia of chronic disease, chronic kidney disease stage IIIa (baseline Cr 1.4) who presents to [facility] long hospital medical floor as a direct admission from her primary care provider office for left lower extremity redness and swelling. ? ?Of note, patient was recently hospitalized at [facility] from 4/8 until 4/11.  Patient was found to be encephalopathic secondary to sepsis due to underlying cellulitis.  Patient was treated with intravenous antibiotics and fluids and clinically improved and was transitioned to doxycycline at time of discharge. ? ?Patient explains that she took the antibiotic as instructed but despite this by Wednesday she began to experience recurrent redness swelling and pain of the left lower extremity.  Patient presented to see her primary care provider that same day on 4/19 and in addition to the doxycycline was given intramuscular injection of ceftriaxone. ? ?In the days that followed, patient's symptoms continued to worsen.  Pain became severe in intensity, worse with any weightbearing whatsoever radiating proximally.  Patient also began to experience generalized weakness and lethargy as a result.  She presented to see her primary care provider again on 4/21 and was told that due to her rapidly progressive symptoms she would need to be rehospitalized for intravenous antibiotics. ? ?The hospitalist group was then contacted and patient was accepted for direct admission to the medical floor for continued treatment of  recurrent left lower extremity cellulitis. ? ?Review of Systems: Review of Systems  ?Constitutional:  Positive for malaise/fatigue.  ?Musculoskeletal:   ?     Left lower extremity pain  ?Neurological:  Positive for weakness.  ?All other systems reviewed and are negative. ? ? ?Past Medical History:  ?Diagnosis Date  ? Anxiety   ? takes Xanax daily as needed  ? Arthritis   ? Bilateral lower extremity edema   ? takes Furosemide daily as needed  ? Breast cancer [facility]) oncologist-  dr Truitt Merle--  right upper-outer quadrant   ? dx May 2016--- Stage 0  (Tis,N0,M0)  DCIS  Right breast---  09-13-2014  s/p  radioactive seed/ partial mastectomy / sln bx  ? Chronic fatigue syndrome   ? Chronic low back pain   ? Complication of anesthesia   ? spinal cord stimulator- to be off for surgery  ? Depression   ? takes Effexor daily  ? Family history of adverse reaction to anesthesia   ? mom hard to wake up  ? Fibromyalgia   ? Headache(784.0)   ? couple of times a week  ? Heart murmur   ? History of bronchitis > 40yr ago  ? History of colon polyps   ? benign  ? History of drug overdose   ? oxycontin  and oxycodone  Sept 2015--  now has narcan injection prescription  ? History of DVT of lower extremity   ? 2008--  BILATERAL  ? History of hiatal hernia   ? HTN (hypertension)   ? takes Amlodipine and Ramipril daily  ? Hyperlipidemia   ? takes Zetia and Crestor  daily  ? Joint pain   ? Mitral valve prolapse   ?  MILD /   PER PT ASYMPTOMATIC  ? Muscle spasm   ? takes Zanaflex daily as needed  ? Osteoporosis   ? Peripheral neuropathy   ? takes Neurontin daily  ? Peripheral vascular disease ([place])   ? hx dvt's  ? RSD (reflex sympathetic dystrophy)   ? left wrist and forearm from a fx  ? RSD (reflex sympathetic dystrophy)   ? Sjogren's disease ([name])   ? Vertigo   ? takes Meclizine daily   ? ? ?Past Surgical History:  ?Procedure Laterality Date  ? BACK SURGERY    ? BREAST RECONSTRUCTION WITH PLACEMENT OF TISSUE EXPANDER AND FLEX HD (ACELLULAR  HYDRATED DERMIS) Right 01/25/2015  ? Procedure: RIGHT BREAST RECONSTRUCTION WITH PLACEMENT OF IMPLANT AND ACELLULAR DERMAL MATRIX ;  Surgeon: Crissie Reese, MD;  Location: [place];  Service: Plastics;  Laterality: Right;  ? BREAST RECONSTRUCTION WITH PLACEMENT OF TISSUE EXPANDER AND FLEX HD (ACELLULAR HYDRATED DERMIS) Left 07/05/2015  ? Procedure: LEFT BREAST RECONSTRUCTION WITH PLACEMENT OF TISSUE EXPANDER AND  ACELLULAR DERMAL MATRIX ;  Surgeon: Crissie Reese, MD;  Location: [place];  Service: Plastics;  Laterality: Left;  ? COLONOSCOPY    ? ESOPHAGOGASTRODUODENOSCOPY    ? eye lid surgery Bilateral   ? FOOT SURGERY Bilateral 1996  ? MORTON'S NEUROMA  ? INNER EAR SURGERY Right   ? LUMBAR DISC SURGERY    ? MASTECTOMY Right 01/25/2015  ? nipple sparing   ? NASAL SINUS SURGERY    ? NIPPLE SPARING MASTECTOMY/SENTINAL LYMPH NODE BIOPSY/RECONSTRUCTION/PLACEMENT OF TISSUE EXPANDER Left 07/05/2015  ? Procedure: LEFT NIPPLE SPARING MASTECTOMY WITH SENTINAL LYMPH NODE BIOPSY ;  Surgeon: [name], MD;  Location: [place];  Service: General;  Laterality: Left;  ? OPEN REDUCTION INTERNAL FIXATION (ORIF) DISTAL RADIAL FRACTURE Right 08/09/2018  ? Procedure: OPEN REDUCTION INTERNAL FIXATION (ORIF) DISTAL RADIAL FRACTURE;  Surgeon: Milly Jakob, MD;  Location: [place];  Service: Orthopedics;  Laterality: Right;  ? ORIF LEFT WRIST FX'S/  LEFT CARPAL TUNNEL RELEASE  1999  ? POSTERIOR LUMBAR FUSION  01-30-2009  ? L4--5 Laminectmy w/ decompression/  bilateral L4--5 microdiskectomy and fusion  ? RADIOACTIVE SEED GUIDED PARTIAL MASTECTOMY WITH AXILLARY SENTINEL LYMPH NODE BIOPSY Right 09/13/2014  ? Procedure: RADIOACTIVE SEED GUIDED PARTIAL MASTECTOMY WITH AXILLARY SENTINEL LYMPH NODE BIOPSY;  Surgeon: [name], MD;  Location: [place];  Service: General;  Laterality: Right;  ? RE-EXCISION OF BREAST LUMPECTOMY Right 09/29/2014  ? Procedure: RE-EXCISION OF BREAST LUMPECTOMY;  Surgeon: [name], MD;   Location: [place];  Service: General;  Laterality: Right;  ? RIGHT CARPAL TUNNEL RELEASE/  TRIGGER RELEASE RIGHT MIDDLE FINGER  06-18-2006  ? RIGHT INFERIOR PARATHYROIDECTOMY  04-07-2006  ? SIMPLE MASTECTOMY WITH AXILLARY SENTINEL NODE BIOPSY Right 01/25/2015  ? Procedure: RIGHT NIPPLE SPARING MASTECTOMY;  Surgeon: [name], MD;  Location: [place];  Service: General;  Laterality: Right;  ? SPINAL CORD STIMULATOR IMPLANT  2013  ? TONSILLECTOMY  as child  ? ? ?Social History:  reports that she has been smoking cigarettes. She has a 6.25 pack-year smoking history. She has never used smokeless tobacco. She reports current alcohol use. She reports that she does not use drugs. ? ?Allergies  ?Allergen Reactions  ? Carbocaine [Mepivacaine Hcl] Hives and Swelling  ? Penicillins Nausea And Vomiting, Swelling and Rash  ?  Has patient had a PCN reaction causing immediate rash, facial/tongue/throat swelling, SOB or lightheadedness with hypotension: Yes ?Has patient had a PCN  reaction causing severe rash involving mucus membranes or skin necrosis: No ?Has patient had a PCN reaction that required hospitalization No ?Has patient had a PCN reaction occurring within the last 10 years: Yes ?If all of the above answers are "NO", then may proceed with Cephalosporin use.  ? Sulfa Antibiotics Nausea And Vomiting, Swelling, Rash and Other (See Comments)  ?  Headache ?  ? Latex Rash  ?  Reaction to gloves and bandaids (paper tape is ok)  ? ? ?Family History  ?Problem Relation Age of Onset  ? Heart murmur Mother   ? Cancer Mother 77  ?     carcinoid tumor   ? Hypertension Mother   ? Heart murmur Sister   ?     x2  ? Cancer Maternal Aunt   ?     lung cancer  ? Cancer Paternal Aunt   ?     breast cancer   ? Cancer Maternal Aunt   ?     gastric cancer   ? Cancer Cousin   ?     lung cancer  ? Cancer Cousin   ?     bone cancer   ? Heart attack Maternal Uncle   ? Heart attack Paternal Uncle   ? Hypertension Son   ? Stroke Neg  Hx   ? ? ?Prior to Admission medications   ?Medication Sig Start Date End Date Taking? Authorizing Provider  ?ALPRAZolam (XANAX) 0.5 MG tablet Take 0.5-1 mg by mouth 2 (two) times daily as needed for anxiety. Take

## 2021-07-20 NOTE — Assessment & Plan Note (Signed)
Strict intake and output monitoring Creatinine near baseline Minimizing nephrotoxic agents as much as possible Serial chemistries to monitor renal function and electrolytes  

## 2021-07-20 NOTE — Progress Notes (Signed)
Patient seen and examined personally, I reviewed the chart, history and physical and admission note, done by admitting physician this morning and agree with the same with following addendum.  Please refer to the morning admission note for more detailed plan of care. ? ?Briefly,  ?71of w/ hypertension, hyperlipidemia, Sjogren's disease, depression, left breast cancer (Dx 08/2015 S/P mastectomy), DCIS of the right breast (Dx 07/2014 S/P right mastectomy), osteoporosis, anemia of chronic disease, chronic kidney disease stage IIIa (baseline Cr 1.4) who presents to [facility] long hospital medical floor as a direct admission from her primary care provider office for left lower extremity redness and swelling.  Recent hospitalization from 4/8 to 4/11 for sepsis encephalopathy due to underlying cellulitis and discharged on doxycycline. ?Her symptoms worsened despite being on antibiotics and seen at PCP on 4/21 and with her rapidly progressive symptoms sent to hospital for admission-patient was placed on vancomycin and cefepime, also found to have AKI on CKD, hypokalemia, along with her chronic issues hyperlipidemia hypertension depression  ? ?On exam he still complains of redness pain on the left ankle foot area, area demarcated and picture taken see  ?below ?A/P ?Cellulitis of left lower extremity:Continue current antibiotics, do x-ray of the left foot follow-up culture data continue pain control. ? ?AKI on CKD stage IIIa: Monitor renal function. ?Recent Labs  ?Lab 07/20/21 ?0100  ?BUN 24*  ?CREATININE 1.84*  ? ?Other issues as below per HPI ?Hypertension ?Hyperlipidemia ?Hypokalemia ?Depression ?

## 2021-07-20 NOTE — Hospital Course (Addendum)
16of w/ hypertension, hyperlipidemia, Sjogren's disease, depression, left breast cancer (Dx 08/2015 S/P mastectomy), DCIS of the right breast (Dx 07/2014 S/P right mastectomy), osteoporosis, anemia of chronic disease, chronic kidney disease stage IIIa (baseline Cr 1.4) who presents to [facility] long hospital medical floor as a direct admission from her primary care provider office for left lower extremity redness and swelling.  Recent hospitalization from 4/8 to 4/11 for sepsis encephalopathy due to underlying cellulitis and discharged on doxycycline. ?Her symptoms worsened despite being on antibiotics and seen at PCP on 4/21 and with her rapidly progressive symptoms sent to hospital for admission-patient was placed on vancomycin and cefepime, also found to have AKI on CKD, hypokalemia, along with her chronic issues hyperlipidemia hypertension depression. ?Patient is also being diuresed intermittently along with Ace wrap, IV antibiotics to address her cellulitis ?

## 2021-07-20 NOTE — Assessment & Plan Note (Signed)
?   Continue home regimen of psychotropic agents ?

## 2021-07-20 NOTE — Progress Notes (Addendum)
Pharmacy Antibiotic Note ? ?Connie Bennett is a 71 y.o. female admitted on 07/19/2021 with left lower extremity redness and swelling. ? .  Pharmacy has been consulted for vancomycin and cefepime dosing. ? ?Plan: ?Vancomycin 1gm IV x 1 then 1gm q48h (AUC 504.5, Scr 1.84) ?Cefepime 2gm IV q24h ?Follow renal function and clinical course ? ?Height: '5\' 2"'$  (157.5 cm) ?Weight: 54.9 kg (121 lb 0.5 oz) ?IBW/kg (Calculated) : 50.1 ? ?Temp (24hrs), Avg:98.3 ?F (36.8 ?C), Min:98.3 ?F (36.8 ?C), Max:98.3 ?F (36.8 ?C) ? ?No results for input(s): WBC, CREATININE, LATICACIDVEN, VANCOTROUGH, VANCOPEAK, VANCORANDOM, GENTTROUGH, GENTPEAK, GENTRANDOM, TOBRATROUGH, TOBRAPEAK, TOBRARND, AMIKACINPEAK, AMIKACINTROU, AMIKACIN in the last 168 hours.  ?Estimated Creatinine Clearance: 35 mL/min (A) (by C-G formula based on SCr of 1.15 mg/dL (H)).   ? ?Allergies  ?Allergen Reactions  ? Carbocaine [Mepivacaine Hcl] Hives and Swelling  ? Penicillins Nausea And Vomiting, Swelling and Rash  ?  Has patient had a PCN reaction causing immediate rash, facial/tongue/throat swelling, SOB or lightheadedness with hypotension: Yes ?Has patient had a PCN reaction causing severe rash involving mucus membranes or skin necrosis: No ?Has patient had a PCN reaction that required hospitalization No ?Has patient had a PCN reaction occurring within the last 10 years: Yes ?If all of the above answers are "NO", then may proceed with Cephalosporin use.  ? Sulfa Antibiotics Nausea And Vomiting, Swelling, Rash and Other (See Comments)  ?  Headache ?  ? Latex Rash  ?  Reaction to gloves and bandaids (paper tape is ok)  ? ? ?Antimicrobials this admission: ?Vanc 4/22 >> ?Cefepime 4/22 >> ? ?Dose adjustments this admission: ? ? ?Microbiology results: ?4/22 BCx:  ? ? ?Thank you for allowing pharmacy to be a part of this patient?s care. ? ?Dolly Rias RPh ?07/20/2021, 1:13 AM ? ? ?

## 2021-07-20 NOTE — Assessment & Plan Note (Signed)
?   Patient suffering from cellulitis of the left lower extremity ?? Symptoms seem to be persisting despite recent hospitalization at [facility] with several days of intravenous antibiotics and eventual discharge home on oral doxycycline ?? Placing patient back on intravenous antibiotics including vancomycin and cefepime. ?? No clinical evidence of sepsis ?? Intravenous volume resuscitation ?? Following blood cultures ?? As needed opiate-based analgesics for associated substantial pain. ? ?

## 2021-07-20 NOTE — Plan of Care (Signed)

## 2021-07-20 NOTE — Assessment & Plan Note (Signed)
.   Continuing home regimen of lipid lowering therapy.  

## 2021-07-20 NOTE — TOC Initial Note (Signed)
Transition of Care (TOC) - Initial/Assessment Note  ? ? ?Patient Details  ?Name: Connie Bennett ?MRN: [id number] ?Date of Birth: 02-Dec-1950 ? ?Transition of Care (TOC) CM/SW Contact:    ?Tawanna Cooler, RN ?Phone Number: ?07/20/2021, 4:42 PM ? ?Clinical Narrative:                 ? ? ?Transition of Care Department (TOC) has reviewed patient and no TOC needs have been identified at this time. We will continue to monitor patient advancement through interdisciplinary progression rounds. If new patient transition needs arise, please place a TOC consult. ? ? ? ?Expected Discharge Plan: Home/Self Care ?Barriers to Discharge: Continued Medical Work up ? ? ?Expected Discharge Plan and Services ?Expected Discharge Plan: Home/Self Care ?  ?   ?Living arrangements for the past 2 months: [place] ?                ?  ?Prior Living Arrangements/Services ?Living arrangements for the past 2 months: [place] ?Lives with:: Spouse ?Patient language and need for interpreter reviewed:: Yes ?       ?Need for Family Participation in Patient Care: Yes (Comment) ?Care giver support system in place?: Yes (comment) ?  ?Criminal Activity/Legal Involvement Pertinent to Current Situation/Hospitalization: No - Comment as needed ? ?Activities of Daily Living ?Home Assistive Devices/Equipment: None ?ADL Screening (condition at time of admission) ?Patient's cognitive ability adequate to safely complete daily activities?: Yes ?Is the patient deaf or have difficulty hearing?: No ?Does the patient have difficulty seeing, even when wearing glasses/contacts?: No ?Does the patient have difficulty concentrating, remembering, or making decisions?: Yes ?Patient able to express need for assistance with ADLs?: Yes ?Does the patient have difficulty dressing or bathing?: No ?Independently performs ADLs?: Yes (appropriate for developmental age) ?Does the patient have difficulty walking or climbing stairs?: No ?Weakness of Legs:  Left ?Weakness of Arms/Hands: None ? ?Emotional Assessment ? ?Orientation: : Oriented to Self, Oriented to Place, Oriented to  Time, Oriented to Situation ?Alcohol / Substance Use: Not Applicable ?Psych Involvement: No (comment) ? ?Admission diagnosis:  Cellulitis of left lower extremity [L03.116] ?Patient Active Problem List  ? Diagnosis Date Noted  ? Cellulitis of left lower extremity 07/20/2021  ? Mixed hyperlipidemia 07/20/2021  ? Major depressive disorder 07/20/2021  ? Acute renal failure superimposed on stage 3a chronic kidney disease ([employer]) 07/20/2021  ? Hypokalemia 07/20/2021  ? Malnutrition of moderate degree 07/08/2021  ? SIRS (systemic inflammatory response syndrome) ([place]) 07/07/2021  ? Osteopenia 11/30/2015  ? Anemia of chronic disease 08/31/2015  ? Breast cancer of upper-inner quadrant of left female breast ([place]) 06/29/2015  ? Status post mastectomy 01/25/2015  ? Preoperative clearance 12/15/2014  ? Right bundle branch block 12/15/2014  ? Essential hypertension 12/15/2014  ? Mitral valve prolapse 12/15/2014  ? Breast cancer of upper-outer quadrant of right female breast ([place]) 09/04/2014  ? Complex sleep apnea syndrome 11/14/2010  ? ?PCP:  Mayra Neer, MD ?Pharmacy:   ?[place], [place] [address] ?[place] [address] ?Phone: [phone] Fax: [phone] ? ?[id number] [place], [place] - [id number]?Phone: [phone]: [phone]? ?

## 2021-07-20 NOTE — Progress Notes (Deleted)
Patient's

## 2021-07-20 NOTE — Assessment & Plan Note (Signed)
·   Replacing with potassium chloride °· Evaluating for concurrent hypomagnesemia  °· Monitoring potassium levels with serial chemistries. ° °

## 2021-07-20 NOTE — Assessment & Plan Note (Addendum)
.   Resume patients home regimen of oral antihypertensives . Titrate antihypertensive regimen as necessary to achieve adequate BP control . PRN intravenous antihypertensives for excessively elevated blood pressure   

## 2021-07-20 NOTE — Assessment & Plan Note (Signed)
?   Patient exhibiting evidence of acute kidney injury secondary to volume depletion due to active infection ?? Creatinine is currently 1.84, an increase compared to baseline of 1.15 eleven days ago ?? Hydrating patient with intravenous isotonic fluids. ?? Strict input and output monitoring ?? Monitoring renal function and electrolytes with serial chemistries ?? Avoiding nephrotoxic agents if at all possible ? ?

## 2021-07-21 ENCOUNTER — Encounter (HOSPITAL_COMMUNITY): Payer: PPO

## 2021-07-21 ENCOUNTER — Inpatient Hospital Stay (HOSPITAL_COMMUNITY): Payer: PPO

## 2021-07-21 DIAGNOSIS — M79605 Pain in left leg: Secondary | ICD-10-CM | POA: Diagnosis not present

## 2021-07-21 DIAGNOSIS — L538 Other specified erythematous conditions: Secondary | ICD-10-CM | POA: Diagnosis not present

## 2021-07-21 DIAGNOSIS — L03116 Cellulitis of left lower limb: Secondary | ICD-10-CM | POA: Diagnosis not present

## 2021-07-21 DIAGNOSIS — R609 Edema, unspecified: Secondary | ICD-10-CM | POA: Diagnosis not present

## 2021-07-21 LAB — BASIC METABOLIC PANEL
Anion gap: 5 (ref 5–15)
BUN: 16 mg/dL (ref 8–23)
CO2: 23 mmol/L (ref 22–32)
Calcium: 8.5 mg/dL — ABNORMAL LOW (ref 8.9–10.3)
Chloride: 112 mmol/L — ABNORMAL HIGH (ref 98–111)
Creatinine, Ser: 1.46 mg/dL — ABNORMAL HIGH (ref 0.44–1.00)
GFR, Estimated: 38 mL/min — ABNORMAL LOW (ref >60)
Glucose, Bld: 90 mg/dL (ref 70–99)
Potassium: 4 mmol/L (ref 3.5–5.1)
Sodium: 140 mmol/L (ref 135–145)

## 2021-07-21 LAB — CBC
HCT: 29.1 % — ABNORMAL LOW (ref 36.0–46.0)
Hemoglobin: 9.1 g/dL — ABNORMAL LOW (ref 12.0–15.0)
MCH: 31.9 pg (ref 26.0–34.0)
MCHC: 31.3 g/dL (ref 30.0–36.0)
MCV: 102.1 fL — ABNORMAL HIGH (ref 80.0–100.0)
Platelets: 340 10*3/uL (ref 150–400)
RBC: 2.85 MIL/uL — ABNORMAL LOW (ref 3.87–5.11)
RDW: 13.3 % (ref 11.5–15.5)
WBC: 13.1 10*3/uL — ABNORMAL HIGH (ref 4.0–10.5)
nRBC: 0 % (ref 0.0–0.2)

## 2021-07-21 LAB — GLUCOSE, CAPILLARY: Glucose-Capillary: 172 mg/dL — ABNORMAL HIGH (ref 70–99)

## 2021-07-21 MED ORDER — BREXPIPRAZOLE 1 MG PO TABS
1.0000 mg | ORAL_TABLET | Freq: Every morning | ORAL | Status: DC
  Administered 2021-07-21 – 2021-07-28 (×8): 1 mg via ORAL
  Filled 2021-07-21 (×8): qty 1

## 2021-07-21 MED ORDER — GABAPENTIN 300 MG PO CAPS
600.0000 mg | ORAL_CAPSULE | Freq: Every day | ORAL | Status: DC
  Administered 2021-07-22 – 2021-07-28 (×7): 600 mg via ORAL
  Filled 2021-07-21 (×7): qty 2

## 2021-07-21 MED ORDER — FUROSEMIDE 10 MG/ML IJ SOLN
20.0000 mg | Freq: Once | INTRAMUSCULAR | Status: AC
  Administered 2021-07-21: 20 mg via INTRAVENOUS
  Filled 2021-07-21: qty 2

## 2021-07-21 MED ORDER — ALPRAZOLAM 1 MG PO TABS
1.0000 mg | ORAL_TABLET | Freq: Three times a day (TID) | ORAL | Status: DC | PRN
  Administered 2021-07-21 – 2021-07-27 (×8): 1 mg via ORAL
  Filled 2021-07-21 (×8): qty 1

## 2021-07-21 MED ORDER — GABAPENTIN 400 MG PO CAPS
1200.0000 mg | ORAL_CAPSULE | Freq: Every day | ORAL | Status: DC
  Administered 2021-07-21 – 2021-07-27 (×7): 1200 mg via ORAL
  Filled 2021-07-21 (×7): qty 3

## 2021-07-21 NOTE — Progress Notes (Signed)
Left lower extremity venous duplex completed. ?Refer to "CV Proc" under chart review to view preliminary results. ? ?07/21/2021 3:01 PM ?Kelby Aline., MHA, RVT, RDCS, RDMS   ?

## 2021-07-21 NOTE — Progress Notes (Signed)
Noted pt has not yet had her Vascular doppler study yet. Call to radiology and they shared the pager # for the on-call person. Page sent and awaiting return call. ?

## 2021-07-21 NOTE — Progress Notes (Signed)
?PROGRESS NOTE ?Connie Bennett  GYI:[id number] DOB: 09/10/1950 DOA: 07/19/2021 ?PCP: Mayra Neer, MD  ? ?Brief Narrative/Hospital Course: ?71of w/ hypertension, hyperlipidemia, Sjogren's disease, depression, left breast cancer (Dx 08/2015 S/P mastectomy), DCIS of the right breast (Dx 07/2014 S/P right mastectomy), osteoporosis, anemia of chronic disease, chronic kidney disease stage IIIa (baseline Cr 1.4) who presents to [facility] long hospital medical floor as a direct admission from her primary care provider office for left lower extremity redness and swelling.  Recent hospitalization from 4/8 to 4/11 for sepsis encephalopathy due to underlying cellulitis and discharged on doxycycline. ?Her symptoms worsened despite being on antibiotics and seen at PCP on 4/21 and with her rapidly progressive symptoms sent to hospital for admission-patient was placed on vancomycin and cefepime, also found to have AKI on CKD, hypokalemia, along with her chronic issues hyperlipidemia hypertension depression  ?  ?Subjective: ?Seen and examined this morning.  Patient reports she feels it has not improved left leg still swollen red,tender ?Overnight afebrile BP stable, labs showed stable creatinine 1.4 WBC slightly up 13k ? ?Assessment and Plan: ?Principal Problem: ?  Cellulitis of left lower extremity ?Active Problems: ?  Essential hypertension ?  Acute renal failure superimposed on stage 3a chronic kidney disease ([place]) ?  Mixed hyperlipidemia ?  Hypokalemia ?  Major depressive disorder ?  ?Cellulitis of left lower extremity ?Recurrent cellulitis recent admission 4/8-4/11: ?Not much improved, will dose IV Lasix 20x1, she was taking as needed Lasix at home.  Ace wrap the leg, Continue on current IV antibiotics vancomycin/cefepime, x-ray of the left foot ankle soft tissue swelling no bony changes. Keep LLE elevated.  Blood culture pending.  Continue pain control.  We will duplex the left lower extremity once pain better. ?  ?AKI on CKD  stage IIIa: Creatinine downtrending, encourage hydration, monitor renal function. ?Recent Labs  ?Lab 07/20/21 ?0100 07/21/21 ?0430  ?BUN 24* 16  ?CREATININE 1.84* 1.46*  ?  ?Hypertension: Well-controlled, on ramipril ?Hyperlipidemia: Continue Crestor/Zetia ?Hypokalemia: Resolved ?Depression: Mood is stable on Neurontin, venlafaxine. ? ?DVT prophylaxis: enoxaparin (LOVENOX) injection 30 mg Start: 07/20/21 1000 ?Code Status:   Code Status: Full Code ?Family Communication: plan of care discussed with patient at bedside. ?Patient status is: Inpatient level of care: Med-Surg  ?Remains inpatient because: Ongoing IV antibiotics ?Patient currently not stable ? ?Dispo: The patient is from: HOME ?           Anticipated disposition: HOME ? ?Mobility Assessment (last 72 hours)   ? ? Mobility Assessment   ? ? [place] Name 07/21/21 0900 07/20/21 1901 07/20/21 1300 07/19/21 2327  ?  ? Does patient have an order for bedrest or is patient medically unstable No - Continue assessment No - Continue assessment No - Continue assessment No - Continue assessment   ? What is the highest level of mobility based on the progressive mobility assessment? Level 5 (Walks with assist in room/hall) - Balance while stepping forward/back and can walk in room with assist - Complete Level 5 (Walks with assist in room/hall) - Balance while stepping forward/back and can walk in room with assist - Complete Level 5 (Walks with assist in room/hall) - Balance while stepping forward/back and can walk in room with assist - Complete Level 5 (Walks with assist in room/hall) - Balance while stepping forward/back and can walk in room with assist - Complete   ? ?  ?  ? ?  ?  ? ?Objective: ?Vitals last 24 hrs: ?Vitals:  ? 07/20/21 0602 07/20/21  1334 2021/07/22 2043 07/21/21 0556  ?BP: 122/63 (!) 114/54 130/61 (!) 158/71  ?Pulse: 76 86 92 95  ?Resp: '20 16 16 18  '$ ?Temp: 98.1 ?F (36.7 ?C) 98.3 ?F (36.8 ?C) 99.4 ?F (37.4 ?C) 98.9 ?F (37.2 ?C)  ?TempSrc: Oral  Oral Oral  ?SpO2:  97% 94% 97% 95%  ?Weight:      ?Height:      ? ?Weight change:  ? ?Physical Examination: ?General exam: AA OX3, tearful,older than stated age, weak appearing. ?HEENT:Oral mucosa moist, Ear/Nose WNL grossly, dentition normal. ?Respiratory system: bilaterally diminished BS, no use of accessory muscle ?Cardiovascular system: S1 & S2 +, No JVD,. ?Gastrointestinal system: Abdomen soft,NT,ND, BS+ ?Nervous System:Alert, awake, moving extremities and grossly nonfocal ?Extremities: Lt LE edema present with erythema tenderness,distal peripheral pulses palpable.  ?Skin: No rashes,no icterus. ?MSK: Normal muscle bulk,tone, power ? ?Medications reviewed:  ?Scheduled Meds: ? anastrozole  1 mg Oral Daily  ? enoxaparin (LOVENOX) injection  30 mg Subcutaneous Q24H  ? ezetimibe  10 mg Oral QHS  ? ferrous sulfate  325 mg Oral Q breakfast  ? furosemide  20 mg Intravenous Once  ? gabapentin  300 mg Oral TID  ? ramipril  10 mg Oral Daily  ? rosuvastatin  20 mg Oral Daily  ? venlafaxine XR  150 mg Oral BID WC  ? ?Continuous Infusions: ? ceFEPime (MAXIPIME) IV 2 g (07/21/21 0249)  ? [START ON 07/22/2021] vancomycin    ? ? ?  ?Diet Order   ? ?       ?  Diet Heart Room service appropriate? Yes; Fluid consistency: Thin  Diet effective now       ?  ? ?  ?  ? ?  ?  ? ?  ?  ?  ? ? ?Intake/Output Summary (Last 24 hours) at 07/21/2021 1017 ?Last data filed at 07/21/2021 2542 ?Gross per 24 hour  ?Intake 1904.09 ml  ?Output 0 ml  ?Net 1904.09 ml  ? ?Net IO Since Admission: 2,964.09 mL [07/21/21 1017]  ?Wt Readings from Last 3 Encounters:  ?07/19/21 54.9 kg  ?07/07/21 56.2 kg  ?05/22/21 57.1 kg  ?  ? ?Unresulted Labs (From admission, onward)  ? ?  Start     Ordered  ? 07/21/21 7062  Basic metabolic panel  Daily,   R     ? 07-22-2021 0817  ? 07/21/21 0500  CBC  Daily,   R     ? 22-Jul-2021 0817  ? ?  ?  ? ?  ?Data Reviewed: I have personally reviewed following labs and imaging studies ?CBC: ?Recent Labs  ?Lab Jul 22, 2021 ?0100 07/21/21 ?0430  ?WBC 11.9* 13.1*   ?NEUTROABS 8.3*  --   ?HGB 9.4* 9.1*  ?HCT 28.9* 29.1*  ?MCV 100.7* 102.1*  ?PLT 319 340  ? ?Basic Metabolic Panel: ?Recent Labs  ?Lab 2021/07/22 ?0100 07/21/21 ?0430  ?NA 138 140  ?K 3.3* 4.0  ?CL 108 112*  ?CO2 24 23  ?GLUCOSE 101* 90  ?BUN 24* 16  ?CREATININE 1.84* 1.46*  ?CALCIUM 9.3 8.5*  ?MG 2.3  --   ? ?GFR: ?Estimated Creatinine Clearance: 27.5 mL/min (A) (by C-G formula based on SCr of 1.46 mg/dL (H)). ?Liver Function Tests: ?Recent Labs  ?Lab July 22, 2021 ?0100  ?AST 45*  ?ALT 29  ?ALKPHOS 73  ?BILITOT 0.4  ?PROT 7.3  ?ALBUMIN 3.2*  ? ?No results for input(s): LIPASE, AMYLASE in the last 168 hours. ?No results for input(s): AMMONIA in the last 168 hours. ?  Coagulation Profile: ?No results for input(s): INR, PROTIME in the last 168 hours. ?BNP (last 3 results) ?No results for input(s): PROBNP in the last 8760 hours. ?HbA1C: ?No results for input(s): HGBA1C in the last 72 hours. ?CBG: ?No results for input(s): GLUCAP in the last 168 hours. ?Lipid Profile: ?No results for input(s): CHOL, HDL, LDLCALC, TRIG, CHOLHDL, LDLDIRECT in the last 72 hours. ?Thyroid Function Tests: ?No results for input(s): TSH, T4TOTAL, FREET4, T3FREE, THYROIDAB in the last 72 hours. ?Sepsis Labs: ?Recent Labs  ?Lab 07/20/21 ?0100  ?LATICACIDVEN 1.0  ? ? ?Recent Results (from the past 240 hour(s))  ?Culture, blood (Routine X 2) w Reflex to ID Panel     Status: None (Preliminary result)  ? Collection Time: 07/20/21  1:00 AM  ? Specimen: BLOOD  ?Result Value Ref Range Status  ? Specimen Description   Final  ?  BLOOD RIGHT ANTECUBITAL ?Performed at [facility], [place] [address], [place], [place] [address] ?  ? Special Requests   Final  ?  BOTTLES DRAWN AEROBIC ONLY Blood Culture adequate volume ?Performed at [facility], [place] [address], [place], [place] [address] ?  ? Culture   Final  ?  NO GROWTH 1 DAY ?Performed at [facility], [place] [address], [place], [place] [address] ?  ? Report Status PENDING   Incomplete  ?Culture, blood (Routine X 2) w Reflex to ID Panel     Status: None (Preliminary result)  ? Collection Time: 07/20/21  1:00 AM  ? Specimen: BLOOD  ?Result Value Ref Range Status  ? Specimen

## 2021-07-22 DIAGNOSIS — L03116 Cellulitis of left lower limb: Secondary | ICD-10-CM | POA: Diagnosis not present

## 2021-07-22 LAB — BASIC METABOLIC PANEL
Anion gap: 7 (ref 5–15)
BUN: 16 mg/dL (ref 8–23)
CO2: 22 mmol/L (ref 22–32)
Calcium: 8.1 mg/dL — ABNORMAL LOW (ref 8.9–10.3)
Chloride: 111 mmol/L (ref 98–111)
Creatinine, Ser: 1.43 mg/dL — ABNORMAL HIGH (ref 0.44–1.00)
GFR, Estimated: 39 mL/min — ABNORMAL LOW (ref >60)
Glucose, Bld: 99 mg/dL (ref 70–99)
Potassium: 4.5 mmol/L (ref 3.5–5.1)
Sodium: 140 mmol/L (ref 135–145)

## 2021-07-22 LAB — CBC
HCT: 26.1 % — ABNORMAL LOW (ref 36.0–46.0)
Hemoglobin: 8.1 g/dL — ABNORMAL LOW (ref 12.0–15.0)
MCH: 31.6 pg (ref 26.0–34.0)
MCHC: 31 g/dL (ref 30.0–36.0)
MCV: 102 fL — ABNORMAL HIGH (ref 80.0–100.0)
Platelets: 311 10*3/uL (ref 150–400)
RBC: 2.56 MIL/uL — ABNORMAL LOW (ref 3.87–5.11)
RDW: 13.3 % (ref 11.5–15.5)
WBC: 10 10*3/uL (ref 4.0–10.5)
nRBC: 0 % (ref 0.0–0.2)

## 2021-07-22 MED ORDER — FUROSEMIDE 10 MG/ML IJ SOLN
20.0000 mg | Freq: Once | INTRAMUSCULAR | Status: AC
  Administered 2021-07-22: 20 mg via INTRAVENOUS
  Filled 2021-07-22: qty 2

## 2021-07-22 MED ORDER — HYDROCORTISONE 1 % EX CREA
1.0000 | TOPICAL_CREAM | Freq: Three times a day (TID) | CUTANEOUS | Status: DC | PRN
Start: 2021-07-22 — End: 2021-07-28
  Administered 2021-07-22: 1 via TOPICAL
  Filled 2021-07-22 (×2): qty 28

## 2021-07-22 MED ORDER — SODIUM CHLORIDE 0.9 % IV SOLN
2.0000 g | INTRAVENOUS | Status: DC
  Administered 2021-07-22 – 2021-07-23 (×2): 2 g via INTRAVENOUS
  Filled 2021-07-22 (×3): qty 20

## 2021-07-22 NOTE — Progress Notes (Signed)
?PROGRESS NOTE ?Connie Bennett  YQM:[id number] DOB: 1950/04/11 DOA: 07/19/2021 ?PCP: Mayra Neer, MD  ? ?Brief Narrative/Hospital Course: ?71of w/ hypertension, hyperlipidemia, Sjogren's disease, depression, left breast cancer (Dx 08/2015 S/P mastectomy), DCIS of the right breast (Dx 07/2014 S/P right mastectomy), osteoporosis, anemia of chronic disease, chronic kidney disease stage IIIa (baseline Cr 1.4) who presents to [facility] long hospital medical floor as a direct admission from her primary care provider office for left lower extremity redness and swelling.  Recent hospitalization from 4/8 to 4/11 for sepsis encephalopathy due to underlying cellulitis and discharged on doxycycline. ?Her symptoms worsened despite being on antibiotics and seen at PCP on 4/21 and with her rapidly progressive symptoms sent to hospital for admission-patient was placed on vancomycin and cefepime, also found to have AKI on CKD, hypokalemia, along with her chronic issues hyperlipidemia hypertension depression  ?  ?Subjective: ?Seen and examined.  She reports her left leg pain.  Better today, currently wrapped in Ace wrap redness still present but slightly better swelling improving ?Overnight no fever/chills.  Stable creat and leukocytosis resolved ? ?Assessment and Plan: ?Principal Problem: ?  Cellulitis of left lower extremity ?Active Problems: ?  Essential hypertension ?  Acute renal failure superimposed on stage 3a chronic kidney disease ([place]) ?  Mixed hyperlipidemia ?  Hypokalemia ?  Major depressive disorder ?  ?Cellulitis of left lower extremity ?Recurrent cellulitis recent admission 4/8-4/11: ?x-ray of the left foot ankle soft tissue swelling no bony changes.  Dopplers negative for DVT. Got iv Lasix x1. ?Patient presents improving still has redness all around, redosed with Lasix x1,Cont on ace wrap, IV antibiotics vancomycin/cefepime, Keep LLE elevated.  Blood culture NGTD Continue pain control.  We will duplex the left lower  extremity once pain better. ?  ?AKI on CKD stage IIIa: Creatinine stable at 1.4 baseline.  Monitor.   ?Recent Labs  ?Lab 07/20/21 ?0100 07/21/21 ?0430 07/22/21 ?0340  ?BUN 24* 16 16  ?CREATININE 1.84* 1.46* 1.43*  ?  ?Hypertension: Stable ramipril ?Hyperlipidemia: Continue Crestor/Zetia ?Hypokalemia: Resolved ?Depression: Mood is stable on Neurontin, venlafaxine. ? ?DVT prophylaxis: enoxaparin (LOVENOX) injection 30 mg Start: 07/20/21 1000 ?Code Status:   Code Status: Full Code ?Family Communication: plan of care discussed with patient at bedside. ?Patient status is: Inpatient level of care: Med-Surg  ?Remains inpatient because: Ongoing IV antibiotics ?Patient currently not stable ? ?Dispo: The patient is from: HOME ?           Anticipated disposition: HOME ? ?Mobility Assessment (last 72 hours)   ? ? Mobility Assessment   ? ? [place] Name 07/21/21 2120 07/21/21 0900 07/20/21 1901 07/20/21 1300 07/19/21 2327  ? Does patient have an order for bedrest or is patient medically unstable No - Continue assessment No - Continue assessment No - Continue assessment No - Continue assessment No - Continue assessment  ? What is the highest level of mobility based on the progressive mobility assessment? Level 5 (Walks with assist in room/hall) - Balance while stepping forward/back and can walk in room with assist - Complete Level 5 (Walks with assist in room/hall) - Balance while stepping forward/back and can walk in room with assist - Complete Level 5 (Walks with assist in room/hall) - Balance while stepping forward/back and can walk in room with assist - Complete Level 5 (Walks with assist in room/hall) - Balance while stepping forward/back and can walk in room with assist - Complete Level 5 (Walks with assist in room/hall) - Balance while stepping forward/back and can  walk in room with assist - Complete  ? ?  ?  ? ?  ?  ? ?Objective: ?Vitals last 24 hrs: ?Vitals:  ? 07/21/21 0556 07/21/21 1351 07/21/21 2139 07/22/21 0619  ?BP: (!)  158/71 136/68 107/80 125/67  ?Pulse: 95 90 94 70  ?Resp: '18 16 18 16  '$ ?Temp: 98.9 ?F (37.2 ?C) 98.5 ?F (36.9 ?C) 99.2 ?F (37.3 ?C) 98.1 ?F (36.7 ?C)  ?TempSrc: Oral Oral Oral Oral  ?SpO2: 95% 100% 96% 96%  ?Weight:      ?Height:      ? ?Weight change:  ? ?Physical Examination: ?General exam: AA,older than stated age, weak appearing. ?HEENT:Oral mucosa moist, Ear/Nose WNL grossly, dentition normal. ?Respiratory system: bilaterally diminished,no use of accessory muscle ?Cardiovascular system: S1 & S2 +, No JVD,. ?Gastrointestinal system: Abdomen soft,NT,ND, BS+ ?Nervous System:Alert, awake, moving extremities and grossly nonfocal ?Extremities: left leg currently wrapped in Ace wrap redness still present but slightly better , swelling improving ?Skin: No rashes,no icterus. ?MSK: Normal muscle bulk,tone, power ? ? ?Medications reviewed:  ?Scheduled Meds: ? anastrozole  1 mg Oral Daily  ? brexpiprazole  1 mg Oral q morning  ? enoxaparin (LOVENOX) injection  30 mg Subcutaneous Q24H  ? ezetimibe  10 mg Oral QHS  ? ferrous sulfate  325 mg Oral Q breakfast  ? gabapentin  1,200 mg Oral QHS  ? gabapentin  600 mg Oral Daily  ? ramipril  10 mg Oral Daily  ? rosuvastatin  20 mg Oral Daily  ? venlafaxine XR  150 mg Oral BID WC  ? ?Continuous Infusions: ? cefTRIAXone (ROCEPHIN)  IV    ? vancomycin 1,000 mg (07/22/21 0539)  ? ? ?  ?Diet Order   ? ?       ?  Diet Heart Room service appropriate? Yes; Fluid consistency: Thin  Diet effective now       ?  ? ?  ?  ? ?  ?  ? ?  ?  ?  ? ? ?Intake/Output Summary (Last 24 hours) at 07/22/2021 1130 ?Last data filed at 07/22/2021 1041 ?Gross per 24 hour  ?Intake 2580.13 ml  ?Output 1250 ml  ?Net 1330.13 ml  ? ?Net IO Since Admission: 4,874.22 mL [07/22/21 1130]  ?Wt Readings from Last 3 Encounters:  ?07/19/21 54.9 kg  ?07/07/21 56.2 kg  ?05/22/21 57.1 kg  ?  ? ?Unresulted Labs (From admission, onward)  ? ?  Start     Ordered  ? 07/21/21 6720  Basic metabolic panel  Daily,   R     ? 2021-08-19 0817   ? 07/21/21 0500  CBC  Daily,   R     ? 08-19-21 0817  ? ?  ?  ? ?  ?Data Reviewed: I have personally reviewed following labs and imaging studies ?CBC: ?Recent Labs  ?Lab 2021/08/19 ?0100 07/21/21 ?0430 07/22/21 ?0340  ?WBC 11.9* 13.1* 10.0  ?NEUTROABS 8.3*  --   --   ?HGB 9.4* 9.1* 8.1*  ?HCT 28.9* 29.1* 26.1*  ?MCV 100.7* 102.1* 102.0*  ?PLT 319 340 311  ? ?Basic Metabolic Panel: ?Recent Labs  ?Lab Aug 19, 2021 ?0100 07/21/21 ?0430 07/22/21 ?0340  ?NA 138 140 140  ?K 3.3* 4.0 4.5  ?CL 108 112* 111  ?CO2 '24 23 22  '$ ?GLUCOSE 101* 90 99  ?BUN 24* 16 16  ?CREATININE 1.84* 1.46* 1.43*  ?CALCIUM 9.3 8.5* 8.1*  ?MG 2.3  --   --   ? ?GFR: ?Estimated Creatinine Clearance: 28.1 mL/min (  A) (by C-G formula based on SCr of 1.43 mg/dL (H)). ?Liver Function Tests: ?Recent Labs  ?Lab 07/20/21 ?0100  ?AST 45*  ?ALT 29  ?ALKPHOS 73  ?BILITOT 0.4  ?PROT 7.3  ?ALBUMIN 3.2*  ? ?No results for input(s): LIPASE, AMYLASE in the last 168 hours. ?No results for input(s): AMMONIA in the last 168 hours. ?Coagulation Profile: ?No results for input(s): INR, PROTIME in the last 168 hours. ?BNP (last 3 results) ?No results for input(s): PROBNP in the last 8760 hours. ?HbA1C: ?No results for input(s): HGBA1C in the last 72 hours. ?CBG: ?Recent Labs  ?Lab 07/21/21 ?2124  ?GLUCAP 172*  ? ?Lipid Profile: ?No results for input(s): CHOL, HDL, LDLCALC, TRIG, CHOLHDL, LDLDIRECT in the last 72 hours. ?Thyroid Function Tests: ?No results for input(s): TSH, T4TOTAL, FREET4, T3FREE, THYROIDAB in the last 72 hours. ?Sepsis Labs: ?Recent Labs  ?Lab 07/20/21 ?0100  ?LATICACIDVEN 1.0  ? ? ?Recent Results (from the past 240 hour(s))  ?Culture, blood (Routine X 2) w Reflex to ID Panel     Status: None (Preliminary result)  ? Collection Time: 07/20/21  1:00 AM  ? Specimen: BLOOD  ?Result Value Ref Range Status  ? Specimen Description   Final  ?  BLOOD RIGHT ANTECUBITAL ?Performed at [facility], [place] [address], [place], [place] [address] ?  ? Special  Requests   Final  ?  BOTTLES DRAWN AEROBIC ONLY Blood Culture adequate volume ?Performed at [facility], [place] [address], [place], [place] [address] ?  ? Culture   Final  ?  NO GROWT

## 2021-07-23 DIAGNOSIS — L03116 Cellulitis of left lower limb: Secondary | ICD-10-CM | POA: Diagnosis not present

## 2021-07-23 LAB — BASIC METABOLIC PANEL
Anion gap: 6 (ref 5–15)
BUN: 13 mg/dL (ref 8–23)
CO2: 26 mmol/L (ref 22–32)
Calcium: 8.5 mg/dL — ABNORMAL LOW (ref 8.9–10.3)
Chloride: 108 mmol/L (ref 98–111)
Creatinine, Ser: 1.3 mg/dL — ABNORMAL HIGH (ref 0.44–1.00)
GFR, Estimated: 44 mL/min — ABNORMAL LOW (ref >60)
Glucose, Bld: 85 mg/dL (ref 70–99)
Potassium: 3.9 mmol/L (ref 3.5–5.1)
Sodium: 140 mmol/L (ref 135–145)

## 2021-07-23 LAB — CBC
HCT: 31 % — ABNORMAL LOW (ref 36.0–46.0)
Hemoglobin: 9.7 g/dL — ABNORMAL LOW (ref 12.0–15.0)
MCH: 31.2 pg (ref 26.0–34.0)
MCHC: 31.3 g/dL (ref 30.0–36.0)
MCV: 99.7 fL (ref 80.0–100.0)
Platelets: 372 10*3/uL (ref 150–400)
RBC: 3.11 MIL/uL — ABNORMAL LOW (ref 3.87–5.11)
RDW: 13.1 % (ref 11.5–15.5)
WBC: 10 10*3/uL (ref 4.0–10.5)
nRBC: 0 % (ref 0.0–0.2)

## 2021-07-23 MED ORDER — FUROSEMIDE 40 MG PO TABS
40.0000 mg | ORAL_TABLET | Freq: Every day | ORAL | Status: DC
  Administered 2021-07-23 – 2021-07-25 (×3): 40 mg via ORAL
  Filled 2021-07-23 (×3): qty 1

## 2021-07-23 MED ORDER — DOXYCYCLINE HYCLATE 100 MG PO TABS
100.0000 mg | ORAL_TABLET | Freq: Two times a day (BID) | ORAL | Status: DC
  Administered 2021-07-23 – 2021-07-24 (×3): 100 mg via ORAL
  Filled 2021-07-23 (×3): qty 1

## 2021-07-23 NOTE — Care Management Important Message (Signed)
Important Message ? ?Patient Details IM Letter given to the Patient. ?Name: Connie Bennett ?MRN: [id number] ?Date of Birth: October 14, 1950 ? ? ?Medicare Important Message Given:  Yes ? ? ? ? ?Kerin Salen ?07/23/2021, 3:17 PM ?

## 2021-07-23 NOTE — Progress Notes (Addendum)
?PROGRESS NOTE ?Connie Bennett  BLT:[id number] DOB: [birth year] DOA: 07/19/2021 ?PCP: Mayra Neer, MD  ? ?Brief Narrative/Hospital Course: ?71of w/ hypertension, hyperlipidemia, Sjogren's disease, depression, left breast cancer (Dx 08/2015 S/P mastectomy), DCIS of the right breast (Dx 07/2014 S/P right mastectomy), osteoporosis, anemia of chronic disease, chronic kidney disease stage IIIa (baseline Cr 1.4) who presents to [facility] long hospital medical floor as a direct admission from her primary care provider office for left lower extremity redness and swelling.  Recent hospitalization from 4/8 to 4/11 for sepsis encephalopathy due to underlying cellulitis and discharged on doxycycline. ?Her symptoms worsened despite being on antibiotics and seen at PCP on 4/21 and with her rapidly progressive symptoms sent to hospital for admission-patient was placed on vancomycin and cefepime, also found to have AKI on CKD, hypokalemia, along with her chronic issues hyperlipidemia hypertension depression. ?Patient is also being diuresed intermittently along with Ace wrap, IV antibiotics to address her cellulitis  ?  ?Subjective: ? ?Seen and examined this morning. ?Left leg edema much better less painful feels much better at this morning but still has a lot of redness on the left lower extremity ?Overnight no fever, renal function stable tolerating Lasix.   ? ?Assessment and Plan: ?Principal Problem: ?  Cellulitis of left lower extremity ?Active Problems: ?  Essential hypertension ?  Acute renal failure superimposed on stage 3a chronic kidney disease ([place]) ?  Mixed hyperlipidemia ?  Hypokalemia ?  Major depressive disorder ?  ?Cellulitis of left lower extremity/left ankle ?Recurrent cellulitis recent admission 4/8-4/11: ?x-ray of the left foot ankle soft tissue swelling no bony changes.Dopplers negative for DVT. S/p lasix 20 mg  x2. Now on ACE wrap- on IV antibiotics vancomycin/cefepime>rocephin- dc vanco, add po doxy, add oral  Lasix, Keep LLE elevated.Blood culture NGTD.Continue pain control. Duplex neg for DVT ?  ?AKI on CKD stage IIIa: Stable ckd now. Monitor.   ?Recent Labs  ?Lab 07/20/21 ?0100 07/21/21 ?0430 07/22/21 ?0076 07/23/21 ?0435  ?BUN 24* '16 16 13  '$ ?CREATININE 1.84* 1.46* 1.43* 1.30*  ?  ?Hypertension: Stable, continue ramipril ?Hyperlipidemia: Continue Crestor/Zetia ?Hypokalemia: Resolved ?Depression: Mood is stable on Neurontin, venlafaxine. ? ?DVT prophylaxis: enoxaparin (LOVENOX) injection 30 mg Start: 07/20/21 1000 ?Code Status:   Code Status: Full Code ?Family Communication: plan of care discussed with patient at bedside. ?Patient status is: Inpatient level of care: Med-Surg  ?Remains inpatient because: Ongoing IV antibiotics ?Patient currently not stable ? ?Dispo: The patient is from: HOME ?           Anticipated disposition: HOME likely next 1 to 2 days if pain and redness continues to improve.  See still feels uncomfortable going home ? ?Mobility Assessment (last 72 hours)   ? ? Mobility Assessment   ? ? [place] Name 07/22/21 1901 07/22/21 1900 07/21/21 2120 07/21/21 0900 07/20/21 1901  ? Does patient have an order for bedrest or is patient medically unstable No - Continue assessment -- No - Continue assessment No - Continue assessment No - Continue assessment  ? What is the highest level of mobility based on the progressive mobility assessment? Level 5 (Walks with assist in room/hall) - Balance while stepping forward/back and can walk in room with assist - Complete -- Level 5 (Walks with assist in room/hall) - Balance while stepping forward/back and can walk in room with assist - Complete Level 5 (Walks with assist in room/hall) - Balance while stepping forward/back and can walk in room with assist - Complete Level 5 (Walks with  assist in room/hall) - Balance while stepping forward/back and can walk in room with assist - Complete  ? ? [place] Name 07-25-21 1300  ?  ?  ?  ?  ? Does patient have an order for bedrest or is  patient medically unstable No - Continue assessment      ? What is the highest level of mobility based on the progressive mobility assessment? Level 5 (Walks with assist in room/hall) - Balance while stepping forward/back and can walk in room with assist - Complete      ? ?  ?  ? ?  ?  ? ?Objective: ?Vitals last 24 hrs: ?Vitals:  ? 07/22/21 1333 07/22/21 1731 07/22/21 2226 07/23/21 0616  ?BP: (!) 148/71 (!) 157/80 (!) 149/76 (!) 169/69  ?Pulse: 80 82 94 85  ?Resp: '15 15 15 16  '$ ?Temp: 98.4 ?F (36.9 ?C) 98.6 ?F (37 ?C) 99.6 ?F (37.6 ?C) 98.9 ?F (37.2 ?C)  ?TempSrc: Oral Oral Oral Oral  ?SpO2: 97% 97% 97% 100%  ?Weight:      ?Height:      ? ?Weight change:  ? ?Physical Examination: ?General exam: AAOX3,older than stated age, weak appearing. ?HEENT:Oral mucosa moist, Ear/Nose WNL grossly, dentition normal. ?Respiratory system: bilaterally diminished, no use of accessory muscle ?Cardiovascular system: S1 & S2 +, No JVD,. ?Gastrointestinal system: Abdomen soft,NT,ND,BS+ ?Nervous System:Alert, awake, moving extremities and grossly nonfocal ?Extremities: LE ankle mild edema and significant redness, still has tenderness, see picture, distal peripheral pulses palpable.  ?Skin: No rashes,no icterus. ?MSK: Normal muscle bulk,tone, power  ? ?Pic 4/25 ? ? ?Pic on admission ? ? ?Medications reviewed:  ?Scheduled Meds: ? anastrozole  1 mg Oral Daily  ? brexpiprazole  1 mg Oral q morning  ? enoxaparin (LOVENOX) injection  30 mg Subcutaneous Q24H  ? ezetimibe  10 mg Oral QHS  ? ferrous sulfate  325 mg Oral Q breakfast  ? gabapentin  1,200 mg Oral QHS  ? gabapentin  600 mg Oral Daily  ? ramipril  10 mg Oral Daily  ? rosuvastatin  20 mg Oral Daily  ? venlafaxine XR  150 mg Oral BID WC  ? ?Continuous Infusions: ? cefTRIAXone (ROCEPHIN)  IV 2 g (07/22/21 1459)  ? vancomycin 1,000 mg (07/22/21 0539)  ? ? ?  ?Diet Order   ? ?       ?  Diet Heart Room service appropriate? Yes; Fluid consistency: Thin  Diet effective now       ?  ? ?  ?  ? ?   ?  ? ?  ?  ?  ? ? ?Intake/Output Summary (Last 24 hours) at 07/23/2021 0758 ?Last data filed at 07/23/2021 0600 ?Gross per 24 hour  ?Intake 522.04 ml  ?Output 200 ml  ?Net 322.04 ml  ? ?Net IO Since Admission: 5,094.22 mL [07/23/21 0758]  ?Wt Readings from Last 3 Encounters:  ?07/19/21 54.9 kg  ?07/07/21 56.2 kg  ?05/22/21 57.1 kg  ?  ? ?Unresulted Labs (From admission, onward)  ? ?  Start     Ordered  ? 07/21/21 3428  Basic metabolic panel  Daily,   R     ? 07-25-21 0817  ? 07/21/21 0500  CBC  Daily,   R     ? July 25, 2021 0817  ? ?  ?  ? ?  ?Data Reviewed: I have personally reviewed following labs and imaging studies ?CBC: ?Recent Labs  ?Lab 07/25/2021 ?0100 07/21/21 ?0430 07/22/21 ?7681 07/23/21 ?0435  ?WBC  11.9* 13.1* 10.0 10.0  ?NEUTROABS 8.3*  --   --   --   ?HGB 9.4* 9.1* 8.1* 9.7*  ?HCT 28.9* 29.1* 26.1* 31.0*  ?MCV 100.7* 102.1* 102.0* 99.7  ?PLT 319 340 311 372  ? ?Basic Metabolic Panel: ?Recent Labs  ?Lab 07/20/21 ?0100 07/21/21 ?0430 07/22/21 ?2979 07/23/21 ?0435  ?NA 138 140 140 140  ?K 3.3* 4.0 4.5 3.9  ?CL 108 112* 111 108  ?CO2 '24 23 22 26  '$ ?GLUCOSE 101* 90 99 85  ?BUN 24* '16 16 13  '$ ?CREATININE 1.84* 1.46* 1.43* 1.30*  ?CALCIUM 9.3 8.5* 8.1* 8.5*  ?MG 2.3  --   --   --   ? ?GFR: ?Estimated Creatinine Clearance: 30.9 mL/min (A) (by C-G formula based on SCr of 1.3 mg/dL (H)). ?Liver Function Tests: ?Recent Labs  ?Lab 07/20/21 ?0100  ?AST 45*  ?ALT 29  ?ALKPHOS 73  ?BILITOT 0.4  ?PROT 7.3  ?ALBUMIN 3.2*  ? ?No results for input(s): LIPASE, AMYLASE in the last 168 hours. ?No results for input(s): AMMONIA in the last 168 hours. ?Coagulation Profile: ?No results for input(s): INR, PROTIME in the last 168 hours. ?BNP (last 3 results) ?No results for input(s): PROBNP in the last 8760 hours. ?HbA1C: ?No results for input(s): HGBA1C in the last 72 hours. ?CBG: ?Recent Labs  ?Lab 07/21/21 ?2124  ?GLUCAP 172*  ? ?Lipid Profile: ?No results for input(s): CHOL, HDL, LDLCALC, TRIG, CHOLHDL, LDLDIRECT in the last 72  hours. ?Thyroid Function Tests: ?No results for input(s): TSH, T4TOTAL, FREET4, T3FREE, THYROIDAB in the last 72 hours. ?Sepsis Labs: ?Recent Labs  ?Lab 07/20/21 ?0100  ?LATICACIDVEN 1.0  ? ? ?Recent Results (fr

## 2021-07-24 DIAGNOSIS — L03116 Cellulitis of left lower limb: Secondary | ICD-10-CM | POA: Diagnosis not present

## 2021-07-24 LAB — CBC
HCT: 30 % — ABNORMAL LOW (ref 36.0–46.0)
Hemoglobin: 9.6 g/dL — ABNORMAL LOW (ref 12.0–15.0)
MCH: 31.5 pg (ref 26.0–34.0)
MCHC: 32 g/dL (ref 30.0–36.0)
MCV: 98.4 fL (ref 80.0–100.0)
Platelets: 398 10*3/uL (ref 150–400)
RBC: 3.05 MIL/uL — ABNORMAL LOW (ref 3.87–5.11)
RDW: 13.2 % (ref 11.5–15.5)
WBC: 8.6 10*3/uL (ref 4.0–10.5)
nRBC: 0 % (ref 0.0–0.2)

## 2021-07-24 LAB — BASIC METABOLIC PANEL
Anion gap: 9 (ref 5–15)
BUN: 17 mg/dL (ref 8–23)
CO2: 28 mmol/L (ref 22–32)
Calcium: 9 mg/dL (ref 8.9–10.3)
Chloride: 104 mmol/L (ref 98–111)
Creatinine, Ser: 1.43 mg/dL — ABNORMAL HIGH (ref 0.44–1.00)
GFR, Estimated: 39 mL/min — ABNORMAL LOW (ref >60)
Glucose, Bld: 88 mg/dL (ref 70–99)
Potassium: 2.9 mmol/L — ABNORMAL LOW (ref 3.5–5.1)
Sodium: 141 mmol/L (ref 135–145)

## 2021-07-24 MED ORDER — CEFAZOLIN SODIUM-DEXTROSE 2-4 GM/100ML-% IV SOLN
2.0000 g | Freq: Three times a day (TID) | INTRAVENOUS | Status: DC
  Administered 2021-07-24 – 2021-07-25 (×4): 2 g via INTRAVENOUS
  Filled 2021-07-24 (×4): qty 100

## 2021-07-24 MED ORDER — POTASSIUM CHLORIDE CRYS ER 20 MEQ PO TBCR
40.0000 meq | EXTENDED_RELEASE_TABLET | ORAL | Status: AC
Start: 2021-07-24 — End: 2021-07-24
  Administered 2021-07-24 (×2): 40 meq via ORAL
  Filled 2021-07-24 (×2): qty 2

## 2021-07-24 NOTE — Consult Note (Signed)
?  [place] for Infectious Disease  Total days of antibiotics 6 ?        ?      ?Reason for Consult: recurrent cellulitis   ?Referring Physician: Antonieta Pert ? ?Principal Problem: ?  Cellulitis of left lower extremity ?Active Problems: ?  Essential hypertension ?  Mixed hyperlipidemia ?  Major depressive disorder ?  Acute renal failure superimposed on stage 3a chronic kidney disease ([name]) ?  Hypokalemia ? ? ? ?HPI: Connie Bennett is a 71 y.o. female with hx of left breast cancer s/p mastectomy and DCIS of right breast s/p mastetcomy in 2017, and 2016, respectively. CKD3, HTN, who was admitted in early April for left lower leg cellulitis from 4/8-4/11 where she was discharged on doxycycline. On follow up to her PCP on 4/17, she was not much improved thus treated with ceftriaxone 1gm IM x 1 plus clindamycin. Then, see again on 4/21 and given another dose of IM ceftriaxone but due to not being improved, she was admitted to the hospital for further management. She was started on cefepime and vancomycin initiallly and recently changed to ceftriaxone on 4/24. The patient reports improvement but still quite painful towards the heal of her foot and having difficulty with weight bearing. She remains afebrile, but concerned that it is taking so long to improve. In reviewing chart photos, it does appear to have some improvement, less tender, less heat, less erythema but it has not regressed significantly. She has been on doxy and ceftriaxone x 2 days. ? ?Past Medical History:  ?Diagnosis Date  ? Anxiety   ? takes Xanax daily as needed  ? Arthritis   ? Bilateral lower extremity edema   ? takes Furosemide daily as needed  ? Breast cancer [facility]) oncologist-  dr Truitt Merle--  right upper-outer quadrant   ? dx May 2016--- Stage 0  (Tis,N0,M0)  DCIS  Right breast---  09-13-2014  s/p  radioactive seed/ partial mastectomy / sln bx  ? Chronic fatigue syndrome   ? Chronic low back pain   ? Complication of anesthesia   ? spinal  cord stimulator- to be off for surgery  ? Depression   ? takes Effexor daily  ? Family history of adverse reaction to anesthesia   ? mom hard to wake up  ? Fibromyalgia   ? Headache(784.0)   ? couple of times a week  ? Heart murmur   ? History of bronchitis > 76yr ago  ? History of colon polyps   ? benign  ? History of drug overdose   ? oxycontin  and oxycodone  Sept 2015--  now has narcan injection prescription  ? History of DVT of lower extremity   ? 2008--  BILATERAL  ? History of hiatal hernia   ? HTN (hypertension)   ? takes Amlodipine and Ramipril daily  ? Hyperlipidemia   ? takes Zetia and Crestor  daily  ? Joint pain   ? Mitral valve prolapse   ? MILD /   PER PT ASYMPTOMATIC  ? Muscle spasm   ? takes Zanaflex daily as needed  ? Osteoporosis   ? Peripheral neuropathy   ? takes Neurontin daily  ? Peripheral vascular disease ([place]   ? hx dvt's  ? RSD (reflex sympathetic dystrophy)   ? left wrist and forearm from a fx  ? RSD (reflex sympathetic dystrophy)   ? Sjogren's disease [place]   ? Vertigo   ? takes Meclizine daily   ? ? ?  Allergies:  ?Allergies  ?Allergen Reactions  ? Carbocaine [Mepivacaine Hcl] Hives and Swelling  ? Penicillins Nausea And Vomiting, Swelling and Rash  ?  Has patient had a PCN reaction causing immediate rash, facial/tongue/throat swelling, SOB or lightheadedness with hypotension: Yes ?Has patient had a PCN reaction causing severe rash involving mucus membranes or skin necrosis: No ?Has patient had a PCN reaction that required hospitalization No ?Has patient had a PCN reaction occurring within the last 10 years: Yes ?If all of the above answers are "NO", then may proceed with Cephalosporin use.  ? Sulfa Antibiotics Nausea And Vomiting, Swelling, Rash and Other (See Comments)  ?  Headache ?  ? Latex Rash  ?  Reaction to gloves and bandaids (paper tape is ok)  ? ? ?MEDICATIONS: ? anastrozole  1 mg Oral Daily  ? brexpiprazole  1 mg Oral q morning  ? enoxaparin (LOVENOX) injection  30 mg  Subcutaneous Q24H  ? ezetimibe  10 mg Oral QHS  ? ferrous sulfate  325 mg Oral Q breakfast  ? furosemide  40 mg Oral Daily  ? gabapentin  1,200 mg Oral QHS  ? gabapentin  600 mg Oral Daily  ? ramipril  10 mg Oral Daily  ? rosuvastatin  20 mg Oral Daily  ? venlafaxine XR  150 mg Oral BID WC  ? ? ?Social History  ? ?Tobacco Use  ? Smoking status: Every Day  ?  Packs/day: 0.25  ?  Years: 25.00  ?  Pack years: 6.25  ?  Types: Cigarettes  ?  Last attempt to quit: 09/29/2014  ?  Years since quitting: 6.8  ? Smokeless tobacco: Never  ? Tobacco comments:  ?  trying to quit  ?Substance Use Topics  ? Alcohol use: Yes  ?  Alcohol/week: 0.0 standard drinks  ?  Comment: occ  ? Drug use: No  ? ? ?Family History  ?Problem Relation Age of Onset  ? Heart murmur Mother   ? Cancer Mother 53  ?     carcinoid tumor   ? Hypertension Mother   ? Heart murmur Sister   ?     x2  ? Cancer Maternal Aunt   ?     lung cancer  ? Cancer Paternal Aunt   ?     breast cancer   ? Cancer Maternal Aunt   ?     gastric cancer   ? Cancer Cousin   ?     lung cancer  ? Cancer Cousin   ?     bone cancer   ? Heart attack Maternal Uncle   ? Heart attack Paternal Uncle   ? Hypertension Son   ? Stroke Neg Hx   ? ? ? ?Review of Systems  ?Constitutional: Negative for fever, chills, diaphoresis, activity change, appetite change, fatigue and unexpected weight change.  ?HENT: Negative for congestion, sore throat, rhinorrhea, sneezing, trouble swallowing and sinus pressure.  ?Eyes: Negative for photophobia and visual disturbance.  ?Respiratory: Negative for cough, chest tightness, shortness of breath, wheezing and stridor.  ?Cardiovascular: Negative for chest pain, palpitations and leg swelling.  ?Gastrointestinal: Negative for nausea, vomiting, abdominal pain, diarrhea, constipation, blood in stool, abdominal distention and anal bleeding.  ?Genitourinary: Negative for dysuria, hematuria, flank pain and difficulty urinating.  ?Musculoskeletal: Negative for myalgias,  back pain, joint swelling, arthralgias and gait problem.  ?Skin: + left leg rash. Negative for color change, pallor, and wound.  ?Neurological: Negative for dizziness, tremors, weakness and light-headedness.  ?  Hematological: Negative for adenopathy. Does not bruise/bleed easily.  ?Psychiatric/Behavioral: Negative for behavioral problems, confusion, sleep disturbance, dysphoric mood, decreased concentration and agitation.  ? ? ?OBJECTIVE: ?Temp:  [97.9 ?F (36.6 ?C)-98.4 ?F (36.9 ?C)] 98.4 ?F (36.9 ?C) (04/26 1356) ?Pulse Rate:  [78-87] 87 (04/26 1356) ?Resp:  [15-18] 15 (04/26 1356) ?BP: (124-143)/(70-84) 124/70 (04/26 1356) ?SpO2:  [96 %-100 %] 100 % (04/26 1356) ?Physical Exam  ?Constitutional:  oriented to person, place, and time. appears well-developed and well-nourished. No distress.  ?HENT: [place]/AT, PERRLA, no scleral icterus ?Mouth/Throat: Oropharynx is clear and moist. No oropharyngeal exudate.  ?Cardiovascular: Normal rate, regular rhythm and normal heart sounds. Exam reveals no gallop and no friction rub.  ?No murmur heard.  ?Pulmonary/Chest: Effort normal and breath sounds normal. No respiratory distress.  has no wheezes.  ?Neck = supple, no nuchal rigidity ?Abdominal: Soft. Bowel sounds are normal.  exhibits no distension. There is no tenderness.  ?Lymphadenopathy: no cervical adenopathy. No axillary adenopathy ?Neurological: alert and oriented to person, place, and time.  ?Skin: +left ankle/foot cellulitis roughly 5 inches above malleolus ?Psychiatric: a normal mood and affect.  behavior is normal.  ? ? ?LABS: ?Results for orders placed or performed during the hospital encounter of 07/19/21 (from the past 48 hour(s))  ?Basic metabolic panel     Status: Abnormal  ? Collection Time: 07/23/21  4:35 AM  ?Result Value Ref Range  ? Sodium 140 135 - 145 mmol/L  ? Potassium 3.9 3.5 - 5.1 mmol/L  ? Chloride 108 98 - 111 mmol/L  ? CO2 26 22 - 32 mmol/L  ? Glucose, Bld 85 70 - 99 mg/dL  ?  Comment: Glucose reference  range applies only to samples taken after fasting for at least 8 hours.  ? BUN 13 8 - 23 mg/dL  ? Creatinine, Ser 1.30 (H) 0.44 - 1.00 mg/dL  ? Calcium 8.5 (L) 8.9 - 10.3 mg/dL  ? GFR, Estimated 44 (L) >60 mL/min

## 2021-07-24 NOTE — Progress Notes (Signed)
?PROGRESS NOTE ?Connie Bennett  ZTI:[id number] DOB: 1950/08/15 DOA: 07/19/2021 ?PCP: Mayra Neer, MD  ? ?Brief Narrative/Hospital Course: ?71of w/ hypertension, hyperlipidemia, Sjogren's disease, depression, left breast cancer (Dx 08/2015 S/P mastectomy), DCIS of the right breast (Dx 07/2014 S/P right mastectomy), osteoporosis, anemia of chronic disease, chronic kidney disease stage IIIa (baseline Cr 1.4) who presents to [facility] long hospital medical floor as a direct admission from her primary care provider office for left lower extremity redness and swelling.  Recent hospitalization from 4/8 to 4/11 for sepsis encephalopathy due to underlying cellulitis and discharged on doxycycline. ?Her symptoms worsened despite being on antibiotics and seen at PCP on 4/21 and with her rapidly progressive symptoms sent to hospital for admission-patient was placed on vancomycin and cefepime, also found to have AKI on CKD, hypokalemia, along with her chronic issues hyperlipidemia hypertension depression. ?Patient is also being diuresed intermittently along with Ace wrap, IV antibiotics to address her cellulitis  ?  ?Subjective: ?Seen and examined this morning.  Patient complains of persistent redness and pain swelling has improved although overall better from presentation but has persistent redness pain and she is concerned. ?Potassium  low 2.9 ?Overnight afebrile.   ? ?Assessment and Plan: ?Principal Problem: ?  Cellulitis of left lower extremity ?Active Problems: ?  Essential hypertension ?  Acute renal failure superimposed on stage 3a chronic kidney disease ([place]) ?  Mixed hyperlipidemia ?  Hypokalemia ?  Major depressive disorder ?  ?Cellulitis of left lower extremity/left ankle ?Recurrent cellulitis recent admission 4/8-4/11: ?x-ray of the left foot ankle soft tissue swelling no bony changes.Dopplers negative for DVT. S/p lasix 20 mg iv  x2 and no on PO lasix and swelling improved-had ACE wrap.  Initially on  vancomycin/cefepime> and now on ceftriaxone/doxycycline.  I will request ID evaluation.  Blood culture negative so far.   ?  ?AKI on CKD stage IIIa: Stable renal function.  Monitor.  ?Recent Labs  ?Lab 07/20/21 ?0100 07/21/21 ?0430 07/22/21 ?8250 07/23/21 ?0435 07/24/21 ?0442  ?BUN 24* '16 16 13 17  '$ ?CREATININE 1.84* 1.46* 1.43* 1.30* 1.43*  ? ?Hypokalemia being repleted ?Hypertension: Stable, continue ramipril ?Hyperlipidemia: Continue Crestor/Zetia ?Depression: Mood is stable on Neurontin, venlafaxine. ? ?DVT prophylaxis: enoxaparin (LOVENOX) injection 30 mg Start: 07/20/21 1000 ?Code Status:   Code Status: Full Code ?Family Communication: plan of care discussed with patient at bedside. ?Patient status is: Inpatient level of care: Med-Surg  ?Remains inpatient because: Ongoing IV antibiotics ?Patient currently not stable ? ?Dispo: The patient is from: HOME ?           Anticipated disposition: HOME likely next next 24 hours, awaiting ID input.   ? ?Mobility Assessment (last 72 hours)   ? ? Mobility Assessment   ? ? [place] Name 07/23/21 1950 07/22/21 1901 07/22/21 1900 07/21/21 2120  ?  ? Does patient have an order for bedrest or is patient medically unstable No - Continue assessment No - Continue assessment -- No - Continue assessment   ? What is the highest level of mobility based on the progressive mobility assessment? Level 5 (Walks with assist in room/hall) - Balance while stepping forward/back and can walk in room with assist - Complete Level 5 (Walks with assist in room/hall) - Balance while stepping forward/back and can walk in room with assist - Complete -- Level 5 (Walks with assist in room/hall) - Balance while stepping forward/back and can walk in room with assist - Complete   ? ?  ?  ? ?  ?  ? ?  Objective: ?Vitals last 24 hrs: ?Vitals:  ? 07/23/21 0616 07/23/21 1322 07/23/21 2122 07/24/21 0603  ?BP: (!) 169/69 (!) 158/88 137/84 (!) 143/71  ?Pulse: 85 98 86 78  ?Resp: '16 15 15 18  '$ ?Temp: 98.9 ?F (37.2 ?C) 98.4  ?F (36.9 ?C) 98.2 ?F (36.8 ?C) 97.9 ?F (36.6 ?C)  ?TempSrc: Oral Oral Oral   ?SpO2: 100% 99% 97% 96%  ?Weight:      ?Height:      ? ?Weight change:  ? ?Physical Examination: ?General exam: AA, older than stated age, weak appearing. ?HEENT:Oral mucosa moist, Ear/Nose WNL grossly, dentition normal. ?Respiratory system: bilaterally diminished, no use of accessory muscle ?Cardiovascular system: S1 & S2 +, No JVD,. ?Gastrointestinal system: Abdomen soft,NT,ND,BS+ ?Nervous System:Alert, awake, moving extremities and grossly nonfocal ?Extremities: LE ankle edema significantly diminished but with distal erythema and tenderness ?Skin: No rashes,no icterus. ?MSK: Normal muscle bulk,tone, power  ?Medications reviewed:  ?Scheduled Meds: ? anastrozole  1 mg Oral Daily  ? brexpiprazole  1 mg Oral q morning  ? doxycycline  100 mg Oral Q12H  ? enoxaparin (LOVENOX) injection  30 mg Subcutaneous Q24H  ? ezetimibe  10 mg Oral QHS  ? ferrous sulfate  325 mg Oral Q breakfast  ? furosemide  40 mg Oral Daily  ? gabapentin  1,200 mg Oral QHS  ? gabapentin  600 mg Oral Daily  ? potassium chloride  40 mEq Oral Q4H  ? ramipril  10 mg Oral Daily  ? rosuvastatin  20 mg Oral Daily  ? venlafaxine XR  150 mg Oral BID WC  ? ?Continuous Infusions: ? cefTRIAXone (ROCEPHIN)  IV 2 g (07/23/21 1552)  ? ? ?  ?Diet Order   ? ?       ?  Diet Heart Room service appropriate? Yes; Fluid consistency: Thin  Diet effective now       ?  ? ?  ?  ? ?  ?  ? ?  ?  ?  ? ? ?Intake/Output Summary (Last 24 hours) at 07/24/2021 1119 ?Last data filed at 07/24/2021 1000 ?Gross per 24 hour  ?Intake 820 ml  ?Output 0 ml  ?Net 820 ml  ? ?Net IO Since Admission: 6,034.22 mL [07/24/21 1119]  ?Wt Readings from Last 3 Encounters:  ?07/19/21 54.9 kg  ?07/07/21 56.2 kg  ?05/22/21 57.1 kg  ?  ? ?Unresulted Labs (From admission, onward)  ? ?  Start     Ordered  ? 07/25/21 2353  Basic metabolic panel  Tomorrow morning,   R       ?Question:  Specimen collection method  Answer:  Lab=Lab  collect  ? 07/24/21 0728  ? ?  ?  ? ?  ?Data Reviewed: I have personally reviewed following labs and imaging studies ?CBC: ?Recent Labs  ?Lab 08/05/21 ?0100 07/21/21 ?0430 07/22/21 ?6144 07/23/21 ?0435 07/24/21 ?0442  ?WBC 11.9* 13.1* 10.0 10.0 8.6  ?NEUTROABS 8.3*  --   --   --   --   ?HGB 9.4* 9.1* 8.1* 9.7* 9.6*  ?HCT 28.9* 29.1* 26.1* 31.0* 30.0*  ?MCV 100.7* 102.1* 102.0* 99.7 98.4  ?PLT 319 340 311 372 398  ? ?Basic Metabolic Panel: ?Recent Labs  ?Lab 08-05-21 ?0100 07/21/21 ?0430 07/22/21 ?3154 07/23/21 ?0435 07/24/21 ?0442  ?NA 138 140 140 140 141  ?K 3.3* 4.0 4.5 3.9 2.9*  ?CL 108 112* 111 108 104  ?CO2 '24 23 22 26 28  '$ ?GLUCOSE 101* 90 99 85 88  ?BUN 24* 16  $'16 13 17  'g$ ?CREATININE 1.84* 1.46* 1.43* 1.30* 1.43*  ?CALCIUM 9.3 8.5* 8.1* 8.5* 9.0  ?MG 2.3  --   --   --   --   ? ?GFR: ?Estimated Creatinine Clearance: 28.1 mL/min (A) (by C-G formula based on SCr of 1.43 mg/dL (H)). ?Liver Function Tests: ?Recent Labs  ?Lab 07/20/21 ?0100  ?AST 45*  ?ALT 29  ?ALKPHOS 73  ?BILITOT 0.4  ?PROT 7.3  ?ALBUMIN 3.2*  ? ?No results for input(s): LIPASE, AMYLASE in the last 168 hours. ?No results for input(s): AMMONIA in the last 168 hours. ?Coagulation Profile: ?No results for input(s): INR, PROTIME in the last 168 hours. ?BNP (last 3 results) ?No results for input(s): PROBNP in the last 8760 hours. ?HbA1C: ?No results for input(s): HGBA1C in the last 72 hours. ?CBG: ?Recent Labs  ?Lab 07/21/21 ?2124  ?GLUCAP 172*  ? ?Lipid Profile: ?No results for input(s): CHOL, HDL, LDLCALC, TRIG, CHOLHDL, LDLDIRECT in the last 72 hours. ?Thyroid Function Tests: ?No results for input(s): TSH, T4TOTAL, FREET4, T3FREE, THYROIDAB in the last 72 hours. ?Sepsis Labs: ?Recent Labs  ?Lab 07/20/21 ?0100  ?LATICACIDVEN 1.0  ? ? ?Recent Results (from the past 240 hour(s))  ?Culture, blood (Routine X 2) w Reflex to ID Panel     Status: None (Preliminary result)  ? Collection Time: 07/20/21  1:00 AM  ? Specimen: BLOOD  ?Result Value Ref Range Status   ? Specimen Description   Final  ?  BLOOD RIGHT ANTECUBITAL ?Performed at [facility], [place] [address], [place], [place] [address] ?  ? Special Requests   Final  ?  BOTTLES DRAWN AEROBIC ONLY Blood

## 2021-07-25 DIAGNOSIS — L03116 Cellulitis of left lower limb: Secondary | ICD-10-CM | POA: Diagnosis not present

## 2021-07-25 LAB — BASIC METABOLIC PANEL
Anion gap: 7 (ref 5–15)
BUN: 17 mg/dL (ref 8–23)
CO2: 27 mmol/L (ref 22–32)
Calcium: 9.3 mg/dL (ref 8.9–10.3)
Chloride: 108 mmol/L (ref 98–111)
Creatinine, Ser: 1.49 mg/dL — ABNORMAL HIGH (ref 0.44–1.00)
GFR, Estimated: 37 mL/min — ABNORMAL LOW (ref >60)
Glucose, Bld: 93 mg/dL (ref 70–99)
Potassium: 4.1 mmol/L (ref 3.5–5.1)
Sodium: 142 mmol/L (ref 135–145)

## 2021-07-25 LAB — CULTURE, BLOOD (ROUTINE X 2)
Culture: NO GROWTH
Culture: NO GROWTH
Special Requests: ADEQUATE
Special Requests: ADEQUATE

## 2021-07-25 MED ORDER — CEFAZOLIN SODIUM-DEXTROSE 2-4 GM/100ML-% IV SOLN
2.0000 g | Freq: Two times a day (BID) | INTRAVENOUS | Status: DC
  Administered 2021-07-25 – 2021-07-27 (×5): 2 g via INTRAVENOUS
  Filled 2021-07-25 (×7): qty 100

## 2021-07-25 MED ORDER — FUROSEMIDE 20 MG PO TABS
20.0000 mg | ORAL_TABLET | Freq: Every day | ORAL | Status: DC
  Administered 2021-07-26 – 2021-07-28 (×3): 20 mg via ORAL
  Filled 2021-07-25 (×3): qty 1

## 2021-07-25 NOTE — Progress Notes (Signed)
?PROGRESS NOTE ?Connie Bennett  SWN:[id number] DOB: 02/13/1951 DOA: 07/19/2021 ?PCP: Mayra Neer, MD  ? ?Brief Narrative/Hospital Course: ?71of w/ hypertension, hyperlipidemia, Sjogren's disease, depression, left breast cancer (Dx 08/2015 S/P mastectomy), DCIS of the right breast (Dx 07/2014 S/P right mastectomy), osteoporosis, anemia of chronic disease, chronic kidney disease stage IIIa (baseline Cr 1.4) who presents to [facility] hospital medical floor as a direct admission from her primary care provider office for left lower extremity redness and swelling.  Recent hospitalization from 4/8 to 4/11 for sepsis encephalopathy due to underlying cellulitis and discharged on doxycycline. ?Her symptoms worsened despite being on antibiotics and seen at PCP on 4/21 and with her rapidly progressive symptoms sent to hospital for admission-patient was placed on vancomycin and cefepime, also found to have AKI on CKD, hypokalemia, along with her chronic issues hyperlipidemia hypertension depression. ?Patient is also being diuresed intermittently along with Ace wrap, IV antibiotics to address her cellulitis  ?  ?Subjective: ?Seen and examined this morning.  She is able to walk with a walker this morning does have redness and pain on her left foot ?Has no new complaints. ? ?Assessment and Plan: ?Principal Problem: ?  Cellulitis of left lower extremity ?Active Problems: ?  Essential hypertension ?  Acute renal failure superimposed on stage 3a chronic kidney disease ([place]) ?  Mixed hyperlipidemia ?  Hypokalemia ?  Major depressive disorder ?  ?Cellulitis of left lower extremity/left ankle ?Recurrent cellulitis recent admission 4/8-4/11: ?x-ray of the left foot ankle soft tissue swelling no bony changes.Dopplers negative for DVT. S/p lasix 20 mg iv  x2 and cut down oral Lasix, swelling significantly improved- had ACE wrap.  Initially on vancomycin/cefepime> changed to ceftriaxone/doxycycline-seen by Dr. Graylon Good and now is  de-escalated to strep coverage with Ancef.  Continue pain control, blood culture no growth so far. ?AKI on CKD stage IIIa: Stable.  ?Recent Labs  ?Lab 07/21/21 ?0430 07/22/21 ?9381 07/23/21 ?8299 07/24/21 ?3716 07/25/21 ?0359  ?BUN '16 16 13 17 17  '$ ?CREATININE 1.46* 1.43* 1.30* 1.43* 1.49*  ? ? ?Hypokalemia resolved. ?Recent Labs  ?Lab 07/21/21 ?0430 07/22/21 ?9678 07/23/21 ?9381 07/24/21 ?0175 07/25/21 ?0359  ?K 4.0 4.5 3.9 2.9* 4.1  ?  ?Hypertension: Stable, continue ramipril ?Hyperlipidemia: Continue Crestor/Zetia ?Depression: Mood is stable on Neurontin, venlafaxine. ? ?DVT prophylaxis: enoxaparin (LOVENOX) injection 30 mg Start: 07/20/21 1000 ?Code Status:   Code Status: Full Code ?Family Communication: plan of care discussed with patient at bedside. ?Patient status is: Inpatient level of care: Med-Surg  ?Remains inpatient because: Ongoing IV antibiotics ?Patient currently not stable ? ?Dispo: The patient is from: HOME ?           Anticipated disposition: HOME once cellulitis better ? ?Mobility Assessment (last 72 hours)   ? ? Mobility Assessment   ? ? [place] Name 07/25/21 0820 07/24/21 2123 07/23/21 1950 07/22/21 1901 07/22/21 1900  ? Does patient have an order for bedrest or is patient medically unstable No - Continue assessment No - Continue assessment No - Continue assessment No - Continue assessment --  ? What is the highest level of mobility based on the progressive mobility assessment? Level 5 (Walks with assist in room/hall) - Balance while stepping forward/back and can walk in room with assist - Complete Level 5 (Walks with assist in room/hall) - Balance while stepping forward/back and can walk in room with assist - Complete Level 5 (Walks with assist in room/hall) - Balance while stepping forward/back and can walk in room with assist -  Complete Level 5 (Walks with assist in room/hall) - Balance while stepping forward/back and can walk in room with assist - Complete --  ? ?  ?  ? ?  ?   ? ?Objective: ?Vitals last 24 hrs: ?Vitals:  ? 07/24/21 0603 07/24/21 1356 07/24/21 2055 07/25/21 0535  ?BP: (!) 143/71 124/70 (!) 151/65 (!) 155/78  ?Pulse: 78 87 83 81  ?Resp: '18 15 17 17  '$ ?Temp: 97.9 ?F (36.6 ?C) 98.4 ?F (36.9 ?C) 98.1 ?F (36.7 ?C) 98.3 ?F (36.8 ?C)  ?TempSrc:  Oral  Oral  ?SpO2: 96% 100% 98% 94%  ?Weight:      ?Height:      ? ?Weight change:  ? ?Physical Examination: ?General exam: AAox3,older than stated age, weak appearing. ?HEENT:Oral mucosa moist, Ear/Nose WNL grossly, dentition normal. ?Respiratory system: bilaterally clear,no use of accessory muscle ?Cardiovascular system: S1 & S2 +, No JVD,. ?Gastrointestinal system: Abdomen soft,NT,ND, BS+ ?Nervous System:Alert, awake, moving extremities and grossly nonfocal ?Extremities: Left lower leg redness tenderness present swelling improved ,distal peripheral pulses palpable.  ?Skin: No rashes,no icterus. ?MSK: Normal muscle bulk,tone, power ? ?Medications reviewed:  ?Scheduled Meds: ? anastrozole  1 mg Oral Daily  ? brexpiprazole  1 mg Oral q morning  ? enoxaparin (LOVENOX) injection  30 mg Subcutaneous Q24H  ? ezetimibe  10 mg Oral QHS  ? ferrous sulfate  325 mg Oral Q breakfast  ? furosemide  40 mg Oral Daily  ? gabapentin  1,200 mg Oral QHS  ? gabapentin  600 mg Oral Daily  ? ramipril  10 mg Oral Daily  ? rosuvastatin  20 mg Oral Daily  ? venlafaxine XR  150 mg Oral BID WC  ? ?Continuous Infusions: ?  ceFAZolin (ANCEF) IV 2 g (07/25/21 0530)  ? ? ?  ?Diet Order   ? ?       ?  Diet Heart Room service appropriate? Yes; Fluid consistency: Thin  Diet effective now       ?  ? ?  ?  ? ?  ? ?Intake/Output Summary (Last 24 hours) at 07/25/2021 1216 ?Last data filed at 07/25/2021 0600 ?Gross per 24 hour  ?Intake 1256.44 ml  ?Output 0 ml  ?Net 1256.44 ml  ? ? ?Net IO Since Admission: 7,290.66 mL [07/25/21 1216]  ?Wt Readings from Last 3 Encounters:  ?07/19/21 54.9 kg  ?07/07/21 56.2 kg  ?05/22/21 57.1 kg  ?  ? ?Unresulted Labs (From admission, onward)   ? ? None  ? ?  ?Data Reviewed: I have personally reviewed following labs and imaging studies ?CBC: ?Recent Labs  ?Lab 07/26/2021 ?0100 07/21/21 ?0430 07/22/21 ?0086 07/23/21 ?0435 07/24/21 ?0442  ?WBC 11.9* 13.1* 10.0 10.0 8.6  ?NEUTROABS 8.3*  --   --   --   --   ?HGB 9.4* 9.1* 8.1* 9.7* 9.6*  ?HCT 28.9* 29.1* 26.1* 31.0* 30.0*  ?MCV 100.7* 102.1* 102.0* 99.7 98.4  ?PLT 319 340 311 372 398  ? ? ?Basic Metabolic Panel: ?Recent Labs  ?Lab 2021/07/26 ?0100 07/21/21 ?0430 07/22/21 ?7619 07/23/21 ?5093 07/24/21 ?2671 07/25/21 ?0359  ?NA 138 140 140 140 141 142  ?K 3.3* 4.0 4.5 3.9 2.9* 4.1  ?CL 108 112* 111 108 104 108  ?CO2 '24 23 22 26 28 27  '$ ?GLUCOSE 101* 90 99 85 88 93  ?BUN 24* '16 16 13 17 17  '$ ?CREATININE 1.84* 1.46* 1.43* 1.30* 1.43* 1.49*  ?CALCIUM 9.3 8.5* 8.1* 8.5* 9.0 9.3  ?MG 2.3  --   --   --   --   --   ? ? ?  GFR: ?Estimated Creatinine Clearance: 27 mL/min (A) (by C-G formula based on SCr of 1.49 mg/dL (H)). ?Liver Function Tests: ?Recent Labs  ?Lab 07/20/21 ?0100  ?AST 45*  ?ALT 29  ?ALKPHOS 73  ?BILITOT 0.4  ?PROT 7.3  ?ALBUMIN 3.2*  ? ? ?No results for input(s): LIPASE, AMYLASE in the last 168 hours. ?No results for input(s): AMMONIA in the last 168 hours. ?Coagulation Profile: ?No results for input(s): INR, PROTIME in the last 168 hours. ?BNP (last 3 results) ?No results for input(s): PROBNP in the last 8760 hours. ?HbA1C: ?No results for input(s): HGBA1C in the last 72 hours. ?CBG: ?Recent Labs  ?Lab 07/21/21 ?2124  ?GLUCAP 172*  ? ? ?Lipid Profile: ?No results for input(s): CHOL, HDL, LDLCALC, TRIG, CHOLHDL, LDLDIRECT in the last 72 hours. ?Thyroid Function Tests: ?No results for input(s): TSH, T4TOTAL, FREET4, T3FREE, THYROIDAB in the last 72 hours. ?Sepsis Labs: ?Recent Labs  ?Lab 07/20/21 ?0100  ?LATICACIDVEN 1.0  ? ? ? ?Recent Results (from the past 240 hour(s))  ?Culture, blood (Routine X 2) w Reflex to ID Panel     Status: None  ? Collection Time: 07/20/21  1:00 AM  ? Specimen: BLOOD  ?Result Value  Ref Range Status  ? Specimen Description   Final  ?  BLOOD RIGHT ANTECUBITAL ?Performed at [facility], [place] [address] [place], [place] [address] ?  ? Special Requests   Final  ?  BOTTLES DRAWN AEROBIC ONLY Blood

## 2021-07-25 NOTE — Progress Notes (Signed)
PHARMACY NOTE:  ANTIMICROBIAL RENAL DOSAGE ADJUSTMENT ? ?Current antimicrobial regimen includes a mismatch between antimicrobial dosage and estimated renal function.  As per policy approved by the Pharmacy & Therapeutics and Medical Executive Committees, the antimicrobial dosage will be adjusted accordingly. ? ?Current antimicrobial dosage:  cefazolin 2g IV q8 hours ? ?Indication: cellulitis ? ?Renal Function: ? ?Estimated Creatinine Clearance: 27 mL/min (A) (by C-G formula based on SCr of 1.49 mg/dL (H)). ?'[]'$      On intermittent HD, scheduled: ?'[]'$      On CRRT ?   ?Antimicrobial dosage has been changed to:  cefazolin 2g IV q12 hours ? ?Additional comments:  ? ? ?Thank you for allowing pharmacy to be a part of this patient's care. ? ?Dimple Nanas, [facility] ?07/25/2021 2:27 PM ? ? ?  ? ?

## 2021-07-25 NOTE — Progress Notes (Signed)
?    [place] for Infectious Disease   ? ?Date of Admission:  07/19/2021   Total days of antibiotics 7 ?        ? ?ID: Connie Bennett is a 71 y.o. female with  left lower leg cellulitis ?Principal Problem: ?  Cellulitis of left lower extremity ?Active Problems: ?  Essential hypertension ?  Mixed hyperlipidemia ?  Major depressive disorder ?  Acute renal failure superimposed on stage 3a chronic kidney disease ([place]) ?  Hypokalemia ? ? ? ?Subjective: ?Remains afebrile. Still some pain with weight bearing. Erythema slightly improved today ? ?Medications:  ? anastrozole  1 mg Oral Daily  ? brexpiprazole  1 mg Oral q morning  ? enoxaparin (LOVENOX) injection  30 mg Subcutaneous Q24H  ? ezetimibe  10 mg Oral QHS  ? ferrous sulfate  325 mg Oral Q breakfast  ? [START ON 07/26/2021] furosemide  20 mg Oral Daily  ? gabapentin  1,200 mg Oral QHS  ? gabapentin  600 mg Oral Daily  ? ramipril  10 mg Oral Daily  ? rosuvastatin  20 mg Oral Daily  ? venlafaxine XR  150 mg Oral BID WC  ? ? ?Objective: ?Vital signs in last 24 hours: ?Temp:  [98.1 ?F (36.7 ?C)-98.4 ?F (36.9 ?C)] 98.3 ?F (36.8 ?C) (04/27 0535) ?Pulse Rate:  [81-87] 81 (04/27 0535) ?Resp:  [15-17] 17 (04/27 0535) ?BP: (124-155)/(65-78) 155/78 (04/27 0535) ?SpO2:  [94 %-100 %] 94 % (04/27 0535) ?Physical Exam  ?Constitutional:  oriented to person, place, and time. appears well-developed and well-nourished. No distress.  ?HENT: [place]/AT, PERRLA, no scleral icterus ?Mouth/Throat: Oropharynx is clear and moist. No oropharyngeal exudate.  ?Cardiovascular: Normal rate, regular rhythm and normal heart sounds. Exam reveals no gallop and no friction rub.  ?No murmur heard.  ?Pulmonary/Chest: Effort normal and breath sounds normal. No respiratory distress.  has no wheezes.  ?Neck = supple, no nuchal rigidity ?Abdominal: Soft. Bowel sounds are normal.  exhibits no distension. There is no tenderness.  ?Lymphadenopathy: no cervical adenopathy. No axillary adenopathy ?Neurological:  alert and oriented to person, place, and time.  ?Skin: LLE erythema  ?Psychiatric: a normal mood and affect.  behavior is normal.  ? ? ?Lab Results ?Recent Labs  ?  07/23/21 ?0435 07/24/21 ?0442 07/25/21 ?0359  ?WBC 10.0 8.6  --   ?HGB 9.7* 9.6*  --   ?HCT 31.0* 30.0*  --   ?NA 140 141 142  ?K 3.9 2.9* 4.1  ?CL 108 104 108  ?CO2 '26 28 27  '$ ?BUN '13 17 17  '$ ?CREATININE 1.30* 1.43* 1.49*  ? ?Liver Panel ?No results for input(s): PROT, ALBUMIN, AST, ALT, ALKPHOS, BILITOT, BILIDIR, IBILI in the last 72 hours. ?Sedimentation Rate ?No results for input(s): ESRSEDRATE in the last 72 hours. ?C-Reactive Protein ?No results for input(s): CRP in the last 72 hours. ? ?Microbiology: ?reviewed ?Studies/Results: ?No results found. ? ? ?Assessment/Plan: ?Left lower leg recurrent cellulitis = recommend to continue on cefazolin today. Anticipate to see more improvement before we can switch to oral cephalosporins. Keep leg elevated.  ? ?Carlyle Basques ?[facility] for Infectious Diseases ?Pager: [phone] ? ?07/25/2021, 12:37 PM ? ? ? ? ? ?

## 2021-07-26 ENCOUNTER — Encounter (HOSPITAL_COMMUNITY): Payer: PPO

## 2021-07-26 DIAGNOSIS — L03116 Cellulitis of left lower limb: Secondary | ICD-10-CM | POA: Diagnosis not present

## 2021-07-26 NOTE — Progress Notes (Addendum)
?    [facility] for Infectious Disease   ? ?Date of Admission:  07/19/2021   Total days of antibiotics 8 ? ?ID: Connie Bennett is a 71 y.o. female with left leg cellulitis ?Principal Problem: ?  Cellulitis of left lower extremity ?Active Problems: ?  Essential hypertension ?  Mixed hyperlipidemia ?  Major depressive disorder ?  Acute renal failure superimposed on stage 3a chronic kidney disease ([place]) ?  Hypokalemia ? ? ? ?Subjective: ?Afebrile. Still left leg issue ? ?Medications:  ? anastrozole  1 mg Oral Daily  ? brexpiprazole  1 mg Oral q morning  ? enoxaparin (LOVENOX) injection  30 mg Subcutaneous Q24H  ? ezetimibe  10 mg Oral QHS  ? ferrous sulfate  325 mg Oral Q breakfast  ? furosemide  20 mg Oral Daily  ? gabapentin  1,200 mg Oral QHS  ? gabapentin  600 mg Oral Daily  ? ramipril  10 mg Oral Daily  ? rosuvastatin  20 mg Oral Daily  ? venlafaxine XR  150 mg Oral BID WC  ? ? ?Objective: ?Vital signs in last 24 hours: ?Temp:  [97.9 ?F (36.6 ?C)-98.1 ?F (36.7 ?C)] 98 ?F (36.7 ?C) (04/28 1330) ?Pulse Rate:  [71-91] 91 (04/28 1330) ?Resp:  [15-18] 15 (04/28 1330) ?BP: (123-178)/(64-84) 145/70 (04/28 1330) ?SpO2:  [79 %-97 %] 79 % (04/28 1330) ?Physical Exam  ?Constitutional:  oriented to person, place, and time. appears well-developed and well-nourished. No distress.  ?HENT: [place]/AT, PERRLA, no scleral icterus ?Mouth/Throat: Oropharynx is clear and moist. No oropharyngeal exudate.  ?Cardiovascular: Normal rate, regular rhythm and normal heart sounds. Exam reveals no gallop and no friction rub.  ?No murmur heard.  ?Pulmonary/Chest: Effort normal and breath sounds normal. No respiratory distress.  has no wheezes.  ?Neck = supple, no nuchal rigidity ?Abdominal: Soft. Bowel sounds are normal.  exhibits no distension. There is no tenderness.  ?Lymphadenopathy: no cervical adenopathy. No axillary adenopathy ?Neurological: alert and oriented to person, place, and time.  ?Skin: Skin is warm and dry. +left erythema--   ?Psychiatric: a normal mood and affect.  behavior is normal.  ? ?Lab Results ?Recent Labs  ?  07/24/21 ?0442 07/25/21 ?0359  ?WBC 8.6  --   ?HGB 9.6*  --   ?HCT 30.0*  --   ?NA 141 142  ?K 2.9* 4.1  ?CL 104 108  ?CO2 28 27  ?BUN 17 17  ?CREATININE 1.43* 1.49*  ? ? ?Microbiology: ?reviewed ?Studies/Results: ?No results found. ? ? ?Assessment/Plan: ?Left leg cellulitis, slow to resolve, now with venous stasis/lymphedema picture ?- continue with cefazolin ?- will do wrap of leg for mild compression to see improvement of symptoms ?- would consider getting US venous reflux study ? ?Consider giving neurontin at bedtime to see if helps symptoms ? ?Carlyle Basques ?[place] for Infectious Diseases ?Pager: [phone] ? ?07/26/2021, 2:47 PM ? ? ? ? ? ?

## 2021-07-26 NOTE — Progress Notes (Signed)
?PROGRESS NOTE ?Connie Bennett  ZCH:[id number] DOB: 08-May-1950 DOA: 07/19/2021 ?PCP: Mayra Neer, MD  ? ?Brief Narrative/Hospital Course: ?71of w/ hypertension, hyperlipidemia, Sjogren's disease, depression, left breast cancer (Dx 08/2015 S/P mastectomy), DCIS of the right breast (Dx 07/2014 S/P right mastectomy), osteoporosis, anemia of chronic disease, chronic kidney disease stage IIIa (baseline Cr 1.4) who presents to [facility] medical floor as a direct admission from her primary care provider office for left lower extremity redness and swelling.  Recent hospitalization from 4/8 to 4/11 for sepsis encephalopathy due to underlying cellulitis and discharged on doxycycline. ?Her symptoms worsened despite being on antibiotics and seen at PCP on 4/21 and with her rapidly progressive symptoms sent to hospital for admission-patient was placed on vancomycin and cefepime, also found to have AKI on CKD, hypokalemia, along with her chronic issues hyperlipidemia hypertension depression. ?Patient is also being diuresed intermittently along with Ace wrap, IV antibiotics to address her cellulitis  ?  ?Subjective: ?Seen and examined this morning.  Still complains of redness and pain on her left lower extremity.  No fever.   ? ?Assessment and Plan: ?Principal Problem: ?  Cellulitis of left lower extremity ?Active Problems: ?  Essential hypertension ?  Acute renal failure superimposed on stage 3a chronic kidney disease ([place]) ?  Mixed hyperlipidemia ?  Hypokalemia ?  Major depressive disorder ?  ?Cellulitis of left lower extremity/left ankle ?Recurrent cellulitis recent admission 4/8-4/11: ?x-ray of the left foot ankle soft tissue swelling no bony changes.Dopplers negative for DVT. S/p lasix 20 mg iv  x2 and cut down oral Lasix, swelling significantly improved- had ACE wrap.  Initially on vancomycin/cefepime> changed to ceftriaxone/doxycycline-seen by Dr. Graylon Good and now on ancef to cover strep.and she is very slow to  improve.  Blood culture no growth so far.  Ambulate with RN/walker.   ? ?AKI on CKD stage IIIa: Stable renal function creatinine only 1.3-1.4. ?Hypokalemia improved ?Hypertension: BP controlled, continue ramipril ?Hyperlipidemia: On Crestor/Zetia ?Depression: Mood is stable on Neurontin, venlafaxine. ? ?DVT prophylaxis: enoxaparin (LOVENOX) injection 30 mg Start: 07/20/21 1000 ?Code Status:   Code Status: Full Code ?Family Communication: plan of care discussed with patient at bedside. ?Patient status is: Inpatient level of care: Med-Surg  ?Remains inpatient because: Ongoing IV antibiotics ?Patient currently not stable ? ?Dispo: The patient is from: HOME ?           Anticipated disposition: HOME once cellulitis better ? ?Mobility Assessment (last 72 hours)   ? ? Mobility Assessment   ? ? [place] Name 07/26/21 [phone] 07/25/21 2140 07/25/21 0820 07/24/21 2123 07/23/21 1950  ? Does patient have an order for bedrest or is patient medically unstable No - Continue assessment No - Continue assessment No - Continue assessment No - Continue assessment No - Continue assessment  ? What is the highest level of mobility based on the progressive mobility assessment? Level 5 (Walks with assist in room/hall) - Balance while stepping forward/back and can walk in room with assist - Complete Level 5 (Walks with assist in room/hall) - Balance while stepping forward/back and can walk in room with assist - Complete Level 5 (Walks with assist in room/hall) - Balance while stepping forward/back and can walk in room with assist - Complete Level 5 (Walks with assist in room/hall) - Balance while stepping forward/back and can walk in room with assist - Complete Level 5 (Walks with assist in room/hall) - Balance while stepping forward/back and can walk in room with assist - Complete  ? ?  ?  ? ?  ?  ? ?  Objective: ?Vitals last 24 hrs: ?Vitals:  ? 07/25/21 1345 07/25/21 2126 07/25/21 2159 07/26/21 0606  ?BP: 134/64 123/64 (!) 178/84 135/73  ?Pulse: 98  71 81 87  ?Resp: '18 18 16 15  '$ ?Temp: 98.9 ?F (37.2 ?C) 98.1 ?F (36.7 ?C)  97.9 ?F (36.6 ?C)  ?TempSrc: Oral     ?SpO2: 97% 95%  97%  ?Weight:      ?Height:      ? ?Weight change:  ? ?Physical Examination: ? ?General exam: AAox3,older than stated age, weak appearing. ?HEENT:Oral mucosa moist, Ear/Nose WNL grossly, dentition normal. ?Respiratory system: bilaterally diminished,no use of accessory muscle ?Cardiovascular system: S1 & S2 +, No JVD,. ?Gastrointestinal system: Abdomen soft,NT,ND, BS+ ?Nervous System:Alert, awake, moving extremities and grossly nonfocal ?Extremities: Left lower extremity erythema tenderness, edema much better overall.   ?Skin: No rashes,no icterus. ?MSK: Normal muscle bulk,tone, power ? ? ?Medications reviewed:  ?Scheduled Meds: ? anastrozole  1 mg Oral Daily  ? brexpiprazole  1 mg Oral q morning  ? enoxaparin (LOVENOX) injection  30 mg Subcutaneous Q24H  ? ezetimibe  10 mg Oral QHS  ? ferrous sulfate  325 mg Oral Q breakfast  ? furosemide  20 mg Oral Daily  ? gabapentin  1,200 mg Oral QHS  ? gabapentin  600 mg Oral Daily  ? ramipril  10 mg Oral Daily  ? rosuvastatin  20 mg Oral Daily  ? venlafaxine XR  150 mg Oral BID WC  ? ?Continuous Infusions: ?  ceFAZolin (ANCEF) IV 2 g (07/26/21 1225)  ? ? ?  ?Diet Order   ? ?       ?  Diet Heart Room service appropriate? Yes; Fluid consistency: Thin  Diet effective now       ?  ? ?  ?  ? ?  ? ?Intake/Output Summary (Last 24 hours) at 07/26/2021 1247 ?Last data filed at 07/26/2021 1000 ?Gross per 24 hour  ?Intake 373.56 ml  ?Output 0 ml  ?Net 373.56 ml  ? ? ?Net IO Since Admission: 7,664.22 mL [07/26/21 1247]  ?Wt Readings from Last 3 Encounters:  ?07/19/21 54.9 kg  ?07/07/21 56.2 kg  ?05/22/21 57.1 kg  ?  ? ?Unresulted Labs (From admission, onward)  ? ? None  ? ?  ?Data Reviewed: I have personally reviewed following labs and imaging studies ?CBC: ?Recent Labs  ?Lab Jul 24, 2021 ?0100 07/21/21 ?0430 07/22/21 ?2836 07/23/21 ?0435 07/24/21 ?0442  ?WBC 11.9*  13.1* 10.0 10.0 8.6  ?NEUTROABS 8.3*  --   --   --   --   ?HGB 9.4* 9.1* 8.1* 9.7* 9.6*  ?HCT 28.9* 29.1* 26.1* 31.0* 30.0*  ?MCV 100.7* 102.1* 102.0* 99.7 98.4  ?PLT 319 340 311 372 398  ? ? ?Basic Metabolic Panel: ?Recent Labs  ?Lab 07/24/2021 ?0100 07/21/21 ?0430 07/22/21 ?6294 07/23/21 ?7654 07/24/21 ?6503 07/25/21 ?0359  ?NA 138 140 140 140 141 142  ?K 3.3* 4.0 4.5 3.9 2.9* 4.1  ?CL 108 112* 111 108 104 108  ?CO2 '24 23 22 26 28 27  '$ ?GLUCOSE 101* 90 99 85 88 93  ?BUN 24* '16 16 13 17 17  '$ ?CREATININE 1.84* 1.46* 1.43* 1.30* 1.43* 1.49*  ?CALCIUM 9.3 8.5* 8.1* 8.5* 9.0 9.3  ?MG 2.3  --   --   --   --   --   ? ? ?GFR: ?Estimated Creatinine Clearance: 27 mL/min (A) (by C-G formula based on SCr of 1.49 mg/dL (H)). ?Liver Function Tests: ?Recent Labs  ?Lab Jul 24, 2021 ?0100  ?  AST 45*  ?ALT 29  ?ALKPHOS 73  ?BILITOT 0.4  ?PROT 7.3  ?ALBUMIN 3.2*  ? ? ?No results for input(s): LIPASE, AMYLASE in the last 168 hours. ?No results for input(s): AMMONIA in the last 168 hours. ?Coagulation Profile: ?No results for input(s): INR, PROTIME in the last 168 hours. ?BNP (last 3 results) ?No results for input(s): PROBNP in the last 8760 hours. ?HbA1C: ?No results for input(s): HGBA1C in the last 72 hours. ?CBG: ?Recent Labs  ?Lab 07/21/21 ?2124  ?GLUCAP 172*  ? ? ?Lipid Profile: ?No results for input(s): CHOL, HDL, LDLCALC, TRIG, CHOLHDL, LDLDIRECT in the last 72 hours. ?Thyroid Function Tests: ?No results for input(s): TSH, T4TOTAL, FREET4, T3FREE, THYROIDAB in the last 72 hours. ?Sepsis Labs: ?Recent Labs  ?Lab 07/20/21 ?0100  ?LATICACIDVEN 1.0  ? ? ? ?Recent Results (from the past 240 hour(s))  ?Culture, blood (Routine X 2) w Reflex to ID Panel     Status: None  ? Collection Time: 07/20/21  1:00 AM  ? Specimen: BLOOD  ?Result Value Ref Range Status  ? Specimen Description   Final  ?  BLOOD RIGHT ANTECUBITAL ?Performed at [facility], [place] [address], [place], [place] [address] ?  ? Special Requests   Final  ?   BOTTLES DRAWN AEROBIC ONLY Blood Culture adequate volume ?Performed at [facility], [place] [address], [place], [place] [address] ?  ? Culture   Final  ?  NO GROWTH 5 DAYS ?Performed at [facility]

## 2021-07-27 DIAGNOSIS — L03116 Cellulitis of left lower limb: Secondary | ICD-10-CM | POA: Diagnosis not present

## 2021-07-27 LAB — BASIC METABOLIC PANEL
Anion gap: 9 (ref 5–15)
BUN: 26 mg/dL — ABNORMAL HIGH (ref 8–23)
CO2: 26 mmol/L (ref 22–32)
Calcium: 9.2 mg/dL (ref 8.9–10.3)
Chloride: 104 mmol/L (ref 98–111)
Creatinine, Ser: 1.58 mg/dL — ABNORMAL HIGH (ref 0.44–1.00)
GFR, Estimated: 35 mL/min — ABNORMAL LOW (ref >60)
Glucose, Bld: 95 mg/dL (ref 70–99)
Potassium: 3.9 mmol/L (ref 3.5–5.1)
Sodium: 139 mmol/L (ref 135–145)

## 2021-07-27 NOTE — Progress Notes (Signed)
? ?PROGRESS NOTE ? ?Connie Bennett:[id number] DOB: 1950/09/19 DOA: 07/19/2021 ?PCP: Mayra Neer, MD ? ?HPI/Recap of past 24 hours: ?71 yo f w/ hypertension, hyperlipidemia, Sjogren's disease, depression, left breast cancer (Dx 08/2015 S/P mastectomy), DCIS of the right breast (Dx 07/2014 S/P right mastectomy), osteoporosis, anemia of chronic disease, chronic kidney disease stage IIIB who presents to [facility] medical floor as a direct admission from her primary care provider office for left lower extremity redness and swelling.  Recent hospitalization from 4/8 to 4/11 for sepsis encephalopathy due to underlying cellulitis and discharged on doxycycline. ?Her symptoms worsened despite being on antibiotics.  Seen at PCP on 4/21 and with her rapidly progressive symptoms sent to hospital for admission-patient was placed on vancomycin and cefepime, also found to have AKI on CKD 3A.  Diuresed intermittently with electrolytes replacement.  Ace wrap, IV antibiotics in place to address her cellulitis.  Her cellulitis is improving.  Plan to switch to po abx on 4/30 at discharge. ? ?07/27/21:  Seen and examined at her bedside.  Her erythema and tenderness are improving.  ? ?Assessment/Plan: ?Principal Problem: ?  Cellulitis of left lower extremity ?Active Problems: ?  Essential hypertension ?  Acute renal failure superimposed on stage 3a chronic kidney disease ([place]) ?  Mixed hyperlipidemia ?  Hypokalemia ?  Major depressive disorder ? ?Cellulitis of left lower extremity/left ankle ?Recurrent cellulitis recent admission 4/8-4/11: ?x-ray of the left foot ankle soft tissue swelling no bony changes. ?Dopplers negative for DVT. S/p lasix 20 mg iv x2 and cut down oral Lasix, swelling significantly improved-continue ACE wrap.  Initially on vancomycin/cefepime> changed to ceftriaxone/doxycycline-seen by Dr. Graylon Good and now on ancef to cover strep.  Blood culture no growth so far.   ?Mom presents as tolerated.   ?  ?AKI  on CKD stage IIIB: ?Baseline creatinine 1.3-1.4 with GFR of 39.  Creatinine uptrending 1.58. ?Hypokalemia resolved ?Hypertension: BP controlled, continue ramipril and p.o. Lasix. ?Hyperlipidemia: On Crestor/Zetia ?Depression: Mood is stable on Neurontin, venlafaxine. ?  ?DVT prophylaxis: enoxaparin (LOVENOX) injection 30 mg Start: 07/20/21 1000 ?Code Status:   Code Status: Full Code ?Family Communication: plan of care discussed with patient at bedside. ?Patient status is: Inpatient level of care: Med-Surg  ?Remains inpatient because: Ongoing IV antibiotics ?Patient currently not stable ?  ?Dispo: The patient is from: HOME ?           Anticipated disposition: HOME likely on 07/28/2021 ?  ?Mobility Assessment (last 72 hours)   ?  ?  Mobility Assessment   ?  ? ? ? ? ? ?Status is: Inpatient ?Patient requires at least 2 midnights for further evaluation and treatment of present condition. ? ? ? ?Objective: ?Vitals:  ? 07/26/21 0606 07/26/21 1330 07/26/21 2038 07/27/21 0603  ?BP: 135/73 (!) 145/70 (!) 160/89 (!) 173/86  ?Pulse: 87 91 97 97  ?Resp: '15 15 18 18  '$ ?Temp: 97.9 ?F (36.6 ?C) 98 ?F (36.7 ?C) 99.3 ?F (37.4 ?C) 99.1 ?F (37.3 ?C)  ?TempSrc:  Oral Oral Oral  ?SpO2: 97% (!) 79% 94% 95%  ?Weight:      ?Height:      ? ? ?Intake/Output Summary (Last 24 hours) at 07/27/2021 1110 ?Last data filed at 07/27/2021 0900 ?Gross per 24 hour  ?Intake 820 ml  ?Output 0 ml  ?Net 820 ml  ? ?Filed Weights  ? 07/19/21 2330  ?Weight: 54.9 kg  ? ? ?Exam: ? ?General: 71 y.o. year-old female well developed well nourished in  no acute distress.  Alert and oriented x3. ?Cardiovascular: Regular rate and rhythm with no rubs or gallops.  No thyromegaly or JVD noted.   ?Respiratory: Clear to auscultation with no wheezes or rales. Good inspiratory effort. ?Abdomen: Soft nontender nondistended with normal bowel sounds x4 quadrants. ?Musculoskeletal: Mild left lower extremity edema. ?Skin: Erythema is improving.  Left lower extremity tenderness is also  improving. ?Psychiatry: Mood is appropriate for condition and setting ? ? ?Data Reviewed: ?CBC: ?Recent Labs  ?Lab 07/21/21 ?0430 07/22/21 ?4656 07/23/21 ?0435 07/24/21 ?0442  ?WBC 13.1* 10.0 10.0 8.6  ?HGB 9.1* 8.1* 9.7* 9.6*  ?HCT 29.1* 26.1* 31.0* 30.0*  ?MCV 102.1* 102.0* 99.7 98.4  ?PLT 340 311 372 398  ? ?Basic Metabolic Panel: ?Recent Labs  ?Lab 07/22/21 ?8127 07/23/21 ?5170 07/24/21 ?0174 07/25/21 ?9449 07/27/21 ?0344  ?NA 140 140 141 142 139  ?K 4.5 3.9 2.9* 4.1 3.9  ?CL 111 108 104 108 104  ?CO2 '22 26 28 27 26  '$ ?GLUCOSE 99 85 88 93 95  ?BUN '16 13 17 17 '$ 26*  ?CREATININE 1.43* 1.30* 1.43* 1.49* 1.58*  ?CALCIUM 8.1* 8.5* 9.0 9.3 9.2  ? ?GFR: ?Estimated Creatinine Clearance: 25.5 mL/min (A) (by C-G formula based on SCr of 1.58 mg/dL (H)). ?Liver Function Tests: ?No results for input(s): AST, ALT, ALKPHOS, BILITOT, PROT, ALBUMIN in the last 168 hours. ?No results for input(s): LIPASE, AMYLASE in the last 168 hours. ?No results for input(s): AMMONIA in the last 168 hours. ?Coagulation Profile: ?No results for input(s): INR, PROTIME in the last 168 hours. ?Cardiac Enzymes: ?No results for input(s): CKTOTAL, CKMB, CKMBINDEX, TROPONINI in the last 168 hours. ?BNP (last 3 results) ?No results for input(s): PROBNP in the last 8760 hours. ?HbA1C: ?No results for input(s): HGBA1C in the last 72 hours. ?CBG: ?Recent Labs  ?Lab 07/21/21 ?2124  ?GLUCAP 172*  ? ?Lipid Profile: ?No results for input(s): CHOL, HDL, LDLCALC, TRIG, CHOLHDL, LDLDIRECT in the last 72 hours. ?Thyroid Function Tests: ?No results for input(s): TSH, T4TOTAL, FREET4, T3FREE, THYROIDAB in the last 72 hours. ?Anemia Panel: ?No results for input(s): VITAMINB12, FOLATE, FERRITIN, TIBC, IRON, RETICCTPCT in the last 72 hours. ?Urine analysis: ?   ?Component Value Date/Time  ? [place] YELLOW 07/07/2021 0052  ? APPEARANCEUR CLEAR 07/07/2021 0052  ? LABSPEC 1.014 07/07/2021 0052  ? PHURINE 8.0 07/07/2021 0052  ? [place] NEGATIVE 07/07/2021 0052  ? [place]  NEGATIVE 07/07/2021 0052  ? [place] NEGATIVE 07/07/2021 0052  ? [place] NEGATIVE 07/07/2021 0052  ? PROTEINUR 100 (A) 07/07/2021 0052  ? NITRITE NEGATIVE 07/07/2021 0052  ? LEUKOCYTESUR NEGATIVE 07/07/2021 0052  ? ?Sepsis Labs: ?'@LABRCNTIP'$ (procalcitonin:4,lacticidven:4) ? ?) ?Recent Results (from the past 240 hour(s))  ?Culture, blood (Routine X 2) w Reflex to ID Panel     Status: None  ? Collection Time: 07/20/21  1:00 AM  ? Specimen: BLOOD  ?Result Value Ref Range Status  ? Specimen Description   Final  ?  BLOOD RIGHT ANTECUBITAL ?Performed at [facility], [place] [address], [place], [place] [address] ?  ? Special Requests   Final  ?  BOTTLES DRAWN AEROBIC ONLY Blood Culture adequate volume ?Performed at [facility], [place] [address], [place], [place] [address] ?  ? Culture   Final  ?  NO GROWTH 5 DAYS ?Performed at [facility], [place] [address], [place], [place] [address] ?  ? Report Status 07/25/2021 FINAL  Final  ?Culture, blood (Routine X 2) w Reflex to ID  Panel     Status: None  ? Collection Time: 07/20/21  1:00 AM  ? Specimen: BLOOD  ?Result Value Ref Range Status  ? Specimen Description   Final  ?  BLOOD LEFT ANTECUBITAL ?Performed at [facility], [place] [address] [place], [place] [address] ?  ? Special Requests   Final  ?  BOTTLES DRAWN AEROBIC ONLY Blood Culture adequate volume ?Performed at [facility], [place] [address] [place], [place] [address] ?  ? Culture   Final  ?  NO GROWTH 5 DAYS ?Performed at [facility], [place] [address] [place], [place] [address] ?  ? Report Status 07/25/2021 FINAL  Final  ?  ? ? ?Studies: ?No results found. ? ?Scheduled Meds: ? anastrozole  1 mg Oral Daily  ? brexpiprazole  1 mg Oral q morning  ? enoxaparin (LOVENOX) injection  30 mg Subcutaneous Q24H  ? ezetimibe  10 mg Oral QHS  ? ferrous sulfate  325 mg Oral Q breakfast  ? furosemide  20 mg Oral Daily  ? gabapentin  1,200 mg Oral QHS   ? gabapentin  600 mg Oral Daily  ? ramipril  10 mg Oral Daily  ? rosuvastatin  20 mg Oral Daily  ? venlafaxine XR  150 mg Oral BID WC  ? ? ?Continuous Infusions: ?  ceFAZolin (ANCEF) IV 2 g (07/26/21 2

## 2021-07-27 NOTE — Progress Notes (Signed)
?    [facility] for Infectious Disease   ? ?Date of Admission:  07/19/2021   Total days of antibiotics 9 ? ? ?ID: SKYLYNNE SCHLECHTER is a 71 y.o. female with  protracted cellulitis c/b venous stasis/lymphedema ?Principal Problem: ?  Cellulitis of left lower extremity ?Active Problems: ?  Essential hypertension ?  Mixed hyperlipidemia ?  Major depressive disorder ?  Acute renal failure superimposed on stage 3a chronic kidney disease ([place]) ?  Hypokalemia ? ? ? ?Subjective: ?Afebrile, still having difficulty with weight bearing on her leg. Has some improvement of erythema with wrapping her leg ? ?Medications:  ? anastrozole  1 mg Oral Daily  ? brexpiprazole  1 mg Oral q morning  ? enoxaparin (LOVENOX) injection  30 mg Subcutaneous Q24H  ? ezetimibe  10 mg Oral QHS  ? ferrous sulfate  325 mg Oral Q breakfast  ? furosemide  20 mg Oral Daily  ? gabapentin  1,200 mg Oral QHS  ? gabapentin  600 mg Oral Daily  ? ramipril  10 mg Oral Daily  ? rosuvastatin  20 mg Oral Daily  ? venlafaxine XR  150 mg Oral BID WC  ? ? ?Objective: ?Vital signs in last 24 hours: ?Temp:  [97.6 ?F (36.4 ?C)-99.3 ?F (37.4 ?C)] 97.6 ?F (36.4 ?C) (04/29 1325) ?Pulse Rate:  [94-97] 94 (04/29 1325) ?Resp:  [16-18] 16 (04/29 1325) ?BP: (140-173)/(86-92) 140/92 (04/29 1325) ?SpO2:  [94 %-95 %] 95 % (04/29 1325) ? ?Physical Exam  ?Constitutional:  oriented to person, place, and time. appears well-developed and well-nourished. No distress.  ?HENT: [place]/AT, PERRLA, no scleral icterus ?Mouth/Throat: Oropharynx is clear and moist. No oropharyngeal exudate.  ?Cardiovascular: Normal rate, regular rhythm and normal heart sounds. Exam reveals no gallop and no friction rub.  ?No murmur heard.  ?Pulmonary/Chest: Effort normal and breath sounds normal. No respiratory distress.  has no wheezes.  ?Neck = supple, no nuchal rigidity ?Abdominal: Soft. Bowel sounds are normal.  exhibits no distension. There is no tenderness.  ?Lymphadenopathy: no cervical adenopathy. No  axillary adenopathy ?Neurological: alert and oriented to person, place, and time.  ?Skin: Skin is warm and dry. No rash noted. No erythema. Less erythema to left leg ?Psychiatric: a normal mood and affect.  behavior is normal.  ? ? ?Lab Results ?Recent Labs  ?  07/25/21 ?0359 07/27/21 ?0344  ?NA 142 139  ?K 4.1 3.9  ?CL 108 104  ?CO2 27 26  ?BUN 17 26*  ?CREATININE 1.49* 1.58*  ? ?Liver Panel ?No results for input(s): PROT, ALBUMIN, AST, ALT, ALKPHOS, BILITOT, BILIDIR, IBILI in the last 72 hours. ?Sedimentation Rate ?No results for input(s): ESRSEDRATE in the last 72 hours. ?C-Reactive Protein ?No results for input(s): CRP in the last 72 hours. ? ?Microbiology: ? ?Studies/Results: ?No results found. ? ? ?Assessment/Plan: ?Cellulitis of left leg = continue on cefazolin then discharge on 7 more days of cefadroxil 1gm bid;continue with leg wrapping ? ?Neuropathy? = continue on gabapentin at bedtime to see if any improvement ? ?Will see back in clinic ? ?Carlyle Basques ?[facility] ?Pager: [phone] ? ?07/27/2021, 5:31 PM ? ? ? ? ? ?

## 2021-07-28 DIAGNOSIS — L03116 Cellulitis of left lower limb: Secondary | ICD-10-CM | POA: Diagnosis not present

## 2021-07-28 MED ORDER — CEFADROXIL 500 MG PO CAPS
500.0000 mg | ORAL_CAPSULE | Freq: Two times a day (BID) | ORAL | 0 refills | Status: AC
Start: 2021-07-28 — End: 2021-08-04

## 2021-07-28 MED ORDER — GABAPENTIN 600 MG PO TABS: 600.0000 mg | ORAL_TABLET | Freq: Every day | ORAL | 0 refills | Status: AC

## 2021-07-28 MED ORDER — POLYETHYLENE GLYCOL 3350 17 G PO PACK: 17.0000 g | PACK | Freq: Every day | ORAL | 0 refills | Status: AC | PRN

## 2021-07-28 MED ORDER — CEFADROXIL 1 G PO TABS: 1.0000 g | ORAL_TABLET | Freq: Two times a day (BID) | ORAL | 0 refills | Status: DC

## 2021-07-28 NOTE — Plan of Care (Signed)

## 2021-07-28 NOTE — Evaluation (Signed)
Physical Therapy Evaluation ?Patient Details ?Name: Connie Bennett ?MRN: [id number] ?DOB: 06-Jul-1950 ?Today's Date: 07/28/2021 ? ?History of Present Illness ? Patient is a 71 year old female who presented to the hospital from PCP office with LLE redness and edmea. patient was found to have protracted cellulitis complicated by venous stasis/lymphedema, AKI, hypokalemia, and HTN. PMH: depression, sjogren's disease, bilateral masectomy.  ?Clinical Impression ? The patient presents with decreased A/PROM of the left ankle, lacks ~ 20* ROM  to neutral.' ?Instructed in measures to increase ROM. Recommend OPPT if the ankle  tightness persists. Patient knows to call her PCP for  referral. No further PT indicated in acute care.Patient to Dc home.   ?   ? ?Recommendations for follow up therapy are one component of a multi-disciplinary discharge planning process, led by the attending physician.  Recommendations may be updated based on patient status, additional functional criteria and insurance authorization. ? ?Follow Up Recommendations Outpatient PT ? ?  ?Assistance Recommended at Discharge PRN  ?Patient can return home with the following ? Help with stairs or ramp for entrance ? ?  ?Equipment Recommendations None recommended by PT  ?Recommendations for Other Services ?    ?  ?Functional Status Assessment    ? ?  ?Precautions / Restrictions Precautions ?Precautions: Fall ?Precaution Comments: self limiting WB on LLE ?Restrictions ?Weight Bearing Restrictions: No  ? ?  ? ?Mobility ? Bed Mobility ?  ?  ?  ?  ?  ?  ?  ?  ?  ? ?Transfers ?Overall transfer level: Modified independent ?  ?  ?  ?  ?  ?  ?  ?  ?  ?  ? ?Ambulation/Gait ?Ambulation/Gait assistance: Modified independent (Device/Increase time) ?Gait Distance (Feet): 40 Feet ?Assistive device: Rolling walker (2 wheels) ?Gait Pattern/deviations: Antalgic ?Gait velocity: decr ?  ?  ?General Gait Details: WB on left forefoot, unable to place foot flat ? ?Stairs ?  ?  ?  ?  ?   ? ?Wheelchair Mobility ?  ? ?Modified Rankin (Stroke Patients Only) ?  ? ?  ? ?Balance Overall balance assessment: Needs assistance ?  ?Sitting balance-Leahy Scale: Normal ?  ?  ?Standing balance support: During functional activity ?Standing balance-Leahy Scale: Fair ?Standing balance comment: self limiting WB on LLE ?  ?  ?  ?  ?  ?  ?  ?  ?  ?  ?  ?   ? ? ? ?Pertinent Vitals/Pain Pain Assessment ?Pain Score: 5  ?Pain Location: anterior left  lower leg/pretibial and posterior  when stretch and WBing ?Pain Descriptors / Indicators: Burning, Discomfort ?Pain Intervention(s): Monitored during session  ? ? ?Home Living Family/patient expects to be discharged to:: Private residence ?Living Arrangements: Spouse/significant other ?Available Help at Discharge: Family;Available 24 hours/day ?Type of Home: House ?Home Access: Stairs to enter ?  ?Entrance Stairs-Number of Steps: 1 step ?Alternate Level Stairs-Number of Steps: 14 ?Home Layout: Two level;Able to live on main level with bedroom/bathroom ?Home Equipment: Conservation officer, nature (2 wheels);Rollator (4 wheels);Cane - single point ?Additional Comments: patient laterl in conversation reported having a rollator and RW but unclear if she still has them.  ?  ?Prior Function Prior Level of Function : Independent/Modified Independent ?  ?  ?  ?  ?  ?  ?Mobility Comments: uses cane off and on for years longer days in community she will use it. ?  ?  ? ? ?Hand Dominance  ?   ? ?  ?Extremity/Trunk Assessment  ?  Upper Extremity Assessment ?Upper Extremity Assessment: Overall WFL for tasks assessed ?  ? ?Lower Extremity Assessment ?Lower Extremity Assessment: LLE deficits/detail ?LLE Deficits / Details: ace wrap on lower leg, lacks 20* of dorsiflexion to neutral/ ?  ? ?Cervical / Trunk Assessment ?Cervical / Trunk Assessment: Normal  ?Communication  ? Communication: No difficulties  ?Cognition Arousal/Alertness: Awake/alert ?Behavior During Therapy: [facility] for tasks  assessed/performed ?Overall Cognitive Status: Within Functional Limits for tasks assessed ?  ?  ?  ?  ?  ?  ?  ?  ?  ?  ?  ?  ?  ?  ?  ?  ?  ?  ?  ? ?  ?General Comments   ? ?  ?Exercises Other Exercises ?Other Exercises: using gait belt to stretch the left heelcord in sitting, seated heelcord stretch with left foot as flat as possible and pressing through the knee -instructed to perform frequently ?Other Exercises: stnading and WBing through the left foot  ? ?Assessment/Plan  ?  ?PT Assessment  (recommend OPPT)  ?PT Problem List   ? ?   ?  ?PT Treatment Interventions     ? ?PT Goals (Current goals can be found in the Care Plan section)  ?Acute Rehab PT Goals ?Patient Stated Goal: go home ?PT Goal Formulation: All assessment and education complete, DC therapy ? ?  ?Frequency   ?  ? ? ?Co-evaluation   ?  ?  ?  ?  ? ? ?  ?AM-PAC PT "6 Clicks" Mobility  ?Outcome Measure Help needed turning from your back to your side while in a flat bed without using bedrails?: None ?Help needed moving from lying on your back to sitting on the side of a flat bed without using bedrails?: None ?Help needed moving to and from a bed to a chair (including a wheelchair)?: None ?Help needed standing up from a chair using your arms (e.g., wheelchair or bedside chair)?: None ?Help needed to walk in hospital room?: A Little ?Help needed climbing 3-5 steps with a railing? : A Little ?6 Click Score: 22 ? ?  ?End of Session   ?Activity Tolerance: Patient tolerated treatment well ?Patient left: in bed ?Nurse Communication: Mobility status ?PT Visit Diagnosis: Unsteadiness on feet (R26.81) ?  ? ?Time: 0923-3007 ?PT Time Calculation (min) (ACUTE ONLY): 21 min ? ? ?Charges:   PT Evaluation ?$PT Eval Low Complexity: 1 Low ?  ?  ?   ? ? ?Tresa Endo PT ?Acute Rehabilitation Services ?Pager [phone] ?Office [phone] ? ? ?Caidyn Henricksen, Shella Maxim ?07/28/2021, 10:21 AM ? ?

## 2021-07-28 NOTE — Evaluation (Signed)
Occupational Therapy Evaluation ?Patient Details ?Name: Connie Bennett ?MRN: [id number] ?DOB: [birth year] ?Today's Date: 07/28/2021 ? ? ?History of Present Illness Patient is a 71 year old female who presented to the hospital from PCP office with LLE redness and edmea. patient was found to have protracted cellulitis complicated by venous stasis/lymphedema, AKI, hypokalemia, and HTN. PMH: depression, sjogren's disease, bilateral masectomy.  ? ?Clinical Impression ?  ?Patient evaluated by Occupational Therapy with no further acute OT needs identified. All education has been completed and the patient has no further questions. Patient is at functional baseline with current pain in LLE. Patient is supervision with safety cues for RW for ADLs. See below for any follow-up Occupational Therapy or equipment needs. OT is signing off. Thank you for this referral.   ?   ? ?Recommendations for follow up therapy are one component of a multi-disciplinary discharge planning process, led by the attending physician.  Recommendations may be updated based on patient status, additional functional criteria and insurance authorization.  ? ?Follow Up Recommendations ? No OT follow up  ?  ?Assistance Recommended at Discharge Frequent or constant Supervision/Assistance  ?Patient can return home with the following Assistance with cooking/housework;Assist for transportation;Help with stairs or ramp for entrance ? ?  ?Functional Status Assessment ? Patient has had a recent decline in their functional status and demonstrates the ability to make significant improvements in function in a reasonable and predictable amount of time.  ?Equipment Recommendations ? Other (comment) (RW)  ?  ?Recommendations for Other Services   ? ? ?  ?Precautions / Restrictions Precautions ?Precautions: Fall ?Precaution Comments: self limiting WB on LLE ?Restrictions ?Weight Bearing Restrictions: No  ? ?  ? ?Mobility Bed Mobility ?Overal bed mobility: Modified  Independent ?  ?  ?  ?  ?  ?  ?  ?  ? ?Transfers ?  ?  ?  ?  ?  ?  ?  ?  ?  ?  ?  ? ?  ?Balance Overall balance assessment: Needs assistance ?Sitting-balance support: No upper extremity supported, Feet supported ?Sitting balance-Leahy Scale: Normal ?  ?  ?Standing balance support: During functional activity ?Standing balance-Leahy Scale: Fair ?Standing balance comment: self limiting WB on LLE ?  ?  ?  ?  ?  ?  ?  ?  ?  ?  ?  ?   ? ?ADL either performed or assessed with clinical judgement  ? ?ADL Overall ADL's : At baseline ?  ?  ?  ?  ?  ?  ?  ?  ?  ?  ?  ?  ?  ?  ?  ?  ?  ?  ?  ?General ADL Comments: patient is supervision for ADls at this time with patietn self limiting WB on LLE and leaving walker behind. patient was educated extensively on ECT and importance of using RW to maintain standing balance. patient indicated having a rollator at home. patient was educated on difference between rollator and RW. patient verbalized understanding. patient ws able to complete LB dressing tasks, simulated showering balance tasks, and functional mobility in room with supervision at RW level. patient recommended to have RW at home until pain has reduced and patient is able to WB more on LLE.patient verbalized understanding.  ? ? ? ?Vision Patient Visual Report: No change from baseline ?   ?   ?Perception   ?  ?Praxis   ?  ? ?Pertinent Vitals/Pain Pain Assessment ?Pain Assessment: 0-10 ?Pain Score:  6  ?Pain Location: LE ?Pain Descriptors / Indicators: Burning, Other (Comment) (stinging) ?Pain Intervention(s): Monitored during session, Premedicated before session  ? ? ? ?Hand Dominance   ?  ?Extremity/Trunk Assessment Upper Extremity Assessment ?Upper Extremity Assessment: Overall WFL for tasks assessed (patient has a history of RTC injury on RUE. patient noted to have signs of possible arthritis in digit joints bilaterally.) ?  ?Lower Extremity Assessment ?Lower Extremity Assessment: Defer to PT evaluation ?  ?Cervical / Trunk  Assessment ?Cervical / Trunk Assessment: Normal ?  ?Communication Communication ?Communication: No difficulties ?  ?Cognition Arousal/Alertness: Awake/alert ?Behavior During Therapy: [facility] for tasks assessed/performed ?Overall Cognitive Status: Within Functional Limits for tasks assessed ?  ?  ?  ?  ?  ?  ?  ?  ?  ?  ?  ?  ?  ?  ?  ?  ?  ?  ?  ?General Comments    ? ?  ?Exercises   ?  ?Shoulder Instructions    ? ? ?Home Living Family/patient expects to be discharged to:: Private residence ?Living Arrangements: Spouse/significant other ?Available Help at Discharge: Family;Available 24 hours/day ?Type of Home: House ?Home Access: Stairs to enter ?Entrance Stairs-Number of Steps: 1 step ?  ?Home Layout: Two level;Able to live on main level with bedroom/bathroom ?Alternate Level Stairs-Number of Steps: 14 ?  ?Bathroom Shower/Tub: Walk-in shower ?  ?  ?  ?  ?Home Equipment: Kasandra Knudsen - single point ?  ?Additional Comments: patient laterl in conversation reported having a rollator and RW but unclear if she still has them. ?  ? ?  ?Prior Functioning/Environment Prior Level of Function : Independent/Modified Independent ?  ?  ?  ?  ?  ?  ?Mobility Comments: uses cane off and on for years longer days in community she will use it. ?  ?  ? ?  ?  ?OT Problem List: Decreased activity tolerance;Impaired balance (sitting and/or standing);Decreased safety awareness;Pain;Decreased knowledge of precautions;Decreased knowledge of use of DME or AE ?  ?   ?OT Treatment/Interventions: Self-care/ADL training;Patient/family education;DME and/or AE instruction;Therapeutic activities  ?  ?OT Goals(Current goals can be found in the care plan section) Acute Rehab OT Goals ?OT Goal Formulation: All assessment and education complete, DC therapy  ?OT Frequency:   ?  ? ?Co-evaluation   ?  ?  ?  ?  ? ?  ?AM-PAC OT "6 Clicks" Daily Activity     ?Outcome Measure Help from another person eating meals?: None ?Help from another person taking care of personal  grooming?: None ?Help from another person toileting, which includes using toliet, bedpan, or urinal?: A Little ?Help from another person bathing (including washing, rinsing, drying)?: A Little ?Help from another person to put on and taking off regular upper body clothing?: A Little ?Help from another person to put on and taking off regular lower body clothing?: A Little ?6 Click Score: 20 ?  ?End of Session Equipment Utilized During Treatment: Rolling walker (2 wheels) ?Nurse Communication: Mobility status ? ?Activity Tolerance: Patient tolerated treatment well;Patient limited by pain ?Patient left: in bed;with call bell/phone within reach ? ?OT Visit Diagnosis: Unsteadiness on feet (R26.81);Other abnormalities of gait and mobility (R26.89);Pain  ?              ?Time: 2505-3976 ?OT Time Calculation (min): 19 min ?Charges:  OT General Charges ?$OT Visit: 1 Visit ?OT Evaluation ?$OT Eval Low Complexity: 1 Low ? ?Fatumata Kashani OTR/L, MS ?Acute Rehabilitation Department ?Office# [phone] ?Pager#  [phone] ? ? ?[place] ?07/28/2021, 8:13 AM ?

## 2021-07-28 NOTE — Discharge Summary (Addendum)
Discharge Summary  Connie Bennett ZOX:[id number] DOB: [birth year]  PCP: Lupita Raider, MD  Admit date: 07/19/2021 Discharge date: 07/28/2021  Time spent: 35 minutes   Recommendations for Outpatient Follow-up:  Follow up with infectious disease in 1-2 weeks. Follow-up with your primary care provider in 1 to 2 weeks. Take your medications as prescribed. Continue fall precautions.  Discharge Diagnoses:  Active Hospital Problems   Diagnosis Date Noted   Cellulitis of left lower extremity 07/20/2021    Priority: 1.   Acute renal failure superimposed on stage 3a chronic kidney disease (HCC) 07/20/2021    Priority: 2.   Essential hypertension 12/15/2014    Priority: 2.   Mixed hyperlipidemia 07/20/2021    Priority: 3.   Hypokalemia 07/20/2021    Priority: 4.   Major depressive disorder 07/20/2021    Priority: 5.    Resolved Hospital Problems   Diagnosis Date Noted Date Resolved   Chronic kidney disease, stage 3a (HCC) 07/20/2021 07/20/2021    Priority: 4.    Discharge Condition: Stable  Diet recommendation: Resume previous diet.  Vitals:   07/27/21 2145 07/28/21 0626  BP: 140/65 (!) 167/71  Pulse: 81 81  Resp: 16 16  Temp: 98.1 F (36.7 C) 98 F (36.7 C)  SpO2: 98% 99%    History of present illness:  71 yo f w/ hypertension, hyperlipidemia, Sjogren's disease, depression, left breast cancer (Dx 08/2015 S/P mastectomy), DCIS of the right breast (Dx 07/2014 S/P right mastectomy), osteoporosis, anemia of chronic disease, chronic kidney disease stage IIIB who presents to [facility] medical floor as a direct admission from her primary care provider office for left lower extremity redness and swelling.  Recent hospitalization from 4/8 to 4/11 for sepsis encephalopathy due to underlying cellulitis and discharged on doxycycline. Her symptoms worsened despite being on antibiotics.  Seen at PCP on 4/21 and with her rapidly progressive symptoms sent to hospital for  admission-patient was placed on vancomycin and cefepime, also found to have AKI on CKD 3A.  Diuresed intermittently with electrolytes replacement.  Ace wrap, IV antibiotics in place to address her cellulitis.  Her cellulitis is improving.  Plan to switch to po abx on 4/30 at discharge.   07/28/21: Seen at her bedside.  There were no acute events overnight.  She has no new complaints and is eager to go home.  Hospital Course:  Principal Problem:   Cellulitis of left lower extremity Active Problems:   Essential hypertension   Acute renal failure superimposed on stage 3a chronic kidney disease (HCC)   Mixed hyperlipidemia   Hypokalemia   Major depressive disorder  Cellulitis of left lower extremity/left ankle Recurrent cellulitis recent admission 4/8-4/11: x-ray of the left foot ankle soft tissue swelling no bony changes. Dopplers negative for DVT. S/p lasix 20 mg iv x2 and daily oral Lasix.  Swelling significantly improved. Continue ACE wrap.   Initially on vancomycin/cefepime> changed to ceftriaxone/doxycycline-seen by Dr. Ilsa Iha, switched to ancef to cover strep.   Blood culture no growth to date.   IV antibiotic, Ancef switched to cefadroxil 1 g twice daily x7 days on 07/28/2021, as recommended by infectious disease.  Cefadroxil renally dosed by pharmacy 500 mg twice daily x7 days. Follow-up with infectious disease, Dr. Ilsa Iha, outpatient.  AKI on CKD stage IIIB: Baseline creatinine 1.3-1.4 with GFR of 39.  Creatinine 1.58.  Continue to avoid nephrotoxic agents and dehydration.  Follow-up with your primary care provider.  Resolved hypokalemia: Serum potassium 3.9 from 2.9.  Hypertension: Continue  home ramipril and p.o. Lasix.  Follow-up with your primary care provider.  Hyperlipidemia: Continue home Crestor/Zetia.  Depression: Mood is stable, continue gabapentin 600 mg nightly as recommended by infectious disease.  Continue home venlafaxine.    Code Status:   Code Status: Full  Code   Discharge Exam: BP (!) 167/71 (BP Location: Left Arm)   Pulse 81   Temp 98 F (36.7 C)   Resp 16   Ht 5\' 2"  (1.575 m)   Wt 54.9 kg   SpO2 99%   BMI 22.14 kg/m  General: 71 y.o. year-old female well developed well nourished in no acute distress.  Alert and oriented x3. Cardiovascular: Regular rate and rhythm with no rubs or gallops.  No thyromegaly or JVD noted.   Respiratory: Clear to auscultation with no wheezes or rales. Good inspiratory effort. Abdomen: Soft nontender nondistended with normal bowel sounds x4 quadrants. Musculoskeletal: Trace left lower extremity edema with Ace wrap.   Psychiatry: Mood is appropriate for condition and setting  Discharge Instructions You were cared for by a hospitalist during your hospital stay. If you have any questions about your discharge medications or the care you received while you were in the hospital after you are discharged, you can call the unit and asked to speak with the hospitalist on call if the hospitalist that took care of you is not available. Once you are discharged, your primary care physician will handle any further medical issues. Please note that NO REFILLS for any discharge medications will be authorized once you are discharged, as it is imperative that you return to your primary care physician (or establish a relationship with a primary care physician if you do not have one) for your aftercare needs so that they can reassess your need for medications and monitor your lab values.   Allergies as of 07/28/2021       Reactions   Carbocaine [mepivacaine Hcl] Hives, Swelling   Penicillins Nausea And Vomiting, Swelling, Rash   Has patient had a PCN reaction causing immediate rash, facial/tongue/throat swelling, SOB or lightheadedness with hypotension: Yes Has patient had a PCN reaction causing severe rash involving mucus membranes or skin necrosis: No Has patient had a PCN reaction that required hospitalization No Has patient  had a PCN reaction occurring within the last 10 years: Yes If all of the above answers are "NO", then may proceed with Cephalosporin use.   Sulfa Antibiotics Nausea And Vomiting, Swelling, Rash, Other (See Comments)   Headache   Latex Rash   Reaction to gloves and bandaids (paper tape is ok)        Medication List     STOP taking these medications    clindamycin 300 MG capsule Commonly known as: CLEOCIN   cyclobenzaprine 10 MG tablet Commonly known as: FLEXERIL   doxycycline 100 MG tablet Commonly known as: VIBRA-TABS   promethazine 25 MG tablet Commonly known as: PHENERGAN   Promethazine-Codeine 6.25-10 MG/5ML Soln       TAKE these medications    acetaminophen 500 MG tablet Commonly known as: TYLENOL Take 1,000 mg by mouth every 6 (six) hours as needed (pain).   ALPRAZolam 1 MG tablet Commonly known as: XANAX Take 1 mg by mouth 3 (three) times daily as needed for anxiety or sleep (nerves). What changed: Another medication with the same name was removed. Continue taking this medication, and follow the directions you see here.   anastrozole 1 MG tablet Commonly known as: ARIMIDEX TAKE 1 TABLET BY MOUTH  EVERY DAY What changed: when to take this   B-complex with vitamin C tablet Take 1 tablet by mouth daily.   brexpiprazole 1 MG Tabs tablet Commonly known as: REXULTI Take 1 mg by mouth every morning.   calcium carbonate 600 MG Tabs tablet Commonly known as: OS-CAL Take 600 mg by mouth daily with lunch.   cefadroxil 500 MG capsule Commonly known as: DURICEF Take 1 capsule (500 mg total) by mouth 2 (two) times daily for 7 days.   ezetimibe 10 MG tablet Commonly known as: ZETIA Take 10 mg by mouth at bedtime.   ferrous sulfate 325 (65 FE) MG tablet Take 1 tablet (325 mg total) by mouth daily with breakfast.   Fish Oil 1000 MG Caps Take 1,000 mg by mouth daily with lunch.   Flax Seed Oil 1000 MG Caps Take 1,000 mg by mouth daily with lunch.    furosemide 40 MG tablet Commonly known as: LASIX Take 40 mg by mouth daily as needed for fluid or edema (leg swelling). Reported on 06/29/2015   gabapentin 600 MG tablet Commonly known as: NEURONTIN Take 1 tablet (600 mg total) by mouth at bedtime for 14 days. Take one tablet (600 mg) by mouth every morning and two tablets (1200 mg) at night What changed:  how much to take when to take this Another medication with the same name was removed. Continue taking this medication, and follow the directions you see here.   lidocaine 5 % Commonly known as: LIDODERM Place 1 patch onto the skin at bedtime as needed (pain).   meclizine 25 MG tablet Commonly known as: ANTIVERT Take 25 mg by mouth 4 (four) times daily as needed for dizziness.   oxyCODONE-acetaminophen 10-325 MG tablet Commonly known as: PERCOCET Take 1 tablet by mouth 5 (five) times daily as needed for pain.   polyethylene glycol 17 g packet Commonly known as: MIRALAX / GLYCOLAX Take 17 g by mouth daily as needed for mild constipation.   ProAir HFA 108 (90 Base) MCG/ACT inhaler Generic drug: albuterol Inhale 2 puffs into the lungs every 4 (four) hours as needed for wheezing or shortness of breath.   PROBIOTIC ACIDOPHILUS PO Take 1 tablet by mouth daily with lunch.   ramipril 10 MG capsule Commonly known as: ALTACE Take 10 mg by mouth every morning.   rosuvastatin 20 MG tablet Commonly known as: CRESTOR Take 20 mg by mouth at bedtime.   venlafaxine XR 150 MG 24 hr capsule Commonly known as: EFFEXOR-XR Take 150 mg by mouth 2 (two) times daily with breakfast and lunch.   vitamin C 1000 MG tablet Take 1,000 mg by mouth daily with lunch.   Vitamin D 50 MCG (2000 UT) tablet Take 2,000 Units by mouth daily with lunch.       Allergies  Allergen Reactions   Carbocaine [Mepivacaine Hcl] Hives and Swelling   Penicillins Nausea And Vomiting, Swelling and Rash    Has patient had a PCN reaction causing immediate rash,  facial/tongue/throat swelling, SOB or lightheadedness with hypotension: Yes Has patient had a PCN reaction causing severe rash involving mucus membranes or skin necrosis: No Has patient had a PCN reaction that required hospitalization No Has patient had a PCN reaction occurring within the last 10 years: Yes If all of the above answers are "NO", then may proceed with Cephalosporin use.   Sulfa Antibiotics Nausea And Vomiting, Swelling, Rash and Other (See Comments)    Headache    Latex Rash  Reaction to gloves and bandaids (paper tape is ok)    Follow-up Information     Lupita Raider, MD. Call today.   Specialty: Family Medicine Why: Please call for a posthospital follow-up appointment. Contact information: [address] [place] [address] [phone]         Judyann Munson, MD. Call today.   Specialty: Infectious Diseases Why: Please call for a post hospital follow-up appointment. [address] [place] [address] [phone]                  The results of significant diagnostics from this hospitalization (including imaging, microbiology, ancillary and laboratory) are listed below for reference.    Significant Diagnostic Studies: DG Chest 2 View  Result Date: 07/06/2021 CLINICAL DATA:  Possible sepsis EXAM: CHEST - 2 VIEW COMPARISON:  09/28/2015 FINDINGS: Cardiac shadow is at the upper limits of normal in size. Aortic calcifications are seen. The lungs are well aerated bilaterally and clear. Spinal stimulator is again noted. No bony abnormality is seen. IMPRESSION: No acute abnormality noted. Electronically Signed   By: Alcide Clever M.D.   On: 07/06/2021 23:36   DG Ankle 2 Views Left  Result Date: 07/20/2021 CLINICAL DATA:  Edema and redness down left lower leg. Pain. History of lower extremity edema. EXAM: LEFT ANKLE - 2 VIEW; LEFT FOOT - 2 VIEW COMPARISON:  None. FINDINGS: Left ankle: The ankle  mortise is symmetric and intact. Mild-to-moderate distal medial and lateral malleolar degenerative osteophytes. Small plantar calcaneal heel spur. Mild chronic spurring at the Achilles insertion on the calcaneus. Mild diffuse ankle soft tissue swelling. No acute fracture or dislocation. No cortical erosion is seen. Left foot: Mild hallux valgus and mild great toe metatarsophalangeal joint space narrowing and peripheral osteophytosis. No acute fracture is seen. No dislocation. IMPRESSION:: IMPRESSION: 1. Mild diffuse ankle soft tissue swelling. 2. No cortical erosion is seen to indicate radiographic evidence of acute osteomyelitis. 3. Mild great toe metatarsophalangeal joint osteoarthritis and mild hallux valgus. Electronically Signed   By: Neita Garnet M.D.   On: 07/20/2021 12:42   DG Abd 2 Views  Result Date: 07/08/2021 CLINICAL DATA:  Abdominal pain. EXAM: ABDOMEN - 2 VIEW COMPARISON:  None. FINDINGS: The bowel gas pattern is normal. There is no evidence of free air. No radio-opaque calculi or other significant radiographic abnormality is seen. Status post surgical posterior fusion of L4-5. IMPRESSION: Negative. Electronically Signed   By: Lupita Raider M.D.   On: 07/08/2021 10:00   DG Foot 2 Views Left  Result Date: 07/20/2021 CLINICAL DATA:  Edema and redness down left lower leg. Pain. History of lower extremity edema. EXAM: LEFT ANKLE - 2 VIEW; LEFT FOOT - 2 VIEW COMPARISON:  None. FINDINGS: Left ankle: The ankle mortise is symmetric and intact. Mild-to-moderate distal medial and lateral malleolar degenerative osteophytes. Small plantar calcaneal heel spur. Mild chronic spurring at the Achilles insertion on the calcaneus. Mild diffuse ankle soft tissue swelling. No acute fracture or dislocation. No cortical erosion is seen. Left foot: Mild hallux valgus and mild great toe metatarsophalangeal joint space narrowing and peripheral osteophytosis. No acute fracture is seen. No dislocation. IMPRESSION::  IMPRESSION: 1. Mild diffuse ankle soft tissue swelling. 2. No cortical erosion is seen to indicate radiographic evidence of acute osteomyelitis. 3. Mild great toe metatarsophalangeal joint osteoarthritis and mild hallux valgus. Electronically Signed   By: Neita Garnet M.D.   On: 07/20/2021 12:42   VAS [place]  LOWER EXTREMITY VENOUS (DVT)  Result Date: 07/21/2021  Lower Venous DVT Study Patient Name:  DAVINEE GRENNAN  Date of Exam:   07/21/2021 Medical Rec #: [id number]          Accession #:    [id number] Date of Birth: Jun 18, 1950           Patient Gender: F Patient Age:   71 years Exam Location:  [facility] Procedure:      VAS [place] LOWER EXTREMITY VENOUS (DVT) Referring Phys: RAMESH KC --------------------------------------------------------------------------------  Indications: Edema, Pain, and Erythema.  Comparison Study: 07/08/2021- negative left lower extremity venous duplex Performing Technologist: Gertie Fey MHA,RDMS, RVT, RDCS  Examination Guidelines: A complete evaluation includes B-mode imaging, spectral Doppler, color Doppler, and power Doppler as needed of all accessible portions of each vessel. Bilateral testing is considered an integral part of a complete examination. Limited examinations for reoccurring indications may be performed as noted. The reflux portion of the exam is performed with the patient in reverse Trendelenburg.  +-----+---------------+---------+-----------+----------+--------------+ RIGHTCompressibilityPhasicitySpontaneityPropertiesThrombus Aging +-----+---------------+---------+-----------+----------+--------------+ CFV  Full           Yes      Yes                                 +-----+---------------+---------+-----------+----------+--------------+   +---------+---------------+---------+-----------+----------+--------------+ LEFT     CompressibilityPhasicitySpontaneityPropertiesThrombus Aging  +---------+---------------+---------+-----------+----------+--------------+ CFV      Full           Yes      Yes                                 +---------+---------------+---------+-----------+----------+--------------+ SFJ      Full                                                        +---------+---------------+---------+-----------+----------+--------------+ FV Prox  Full                                                        +---------+---------------+---------+-----------+----------+--------------+ FV Mid   Full                                                        +---------+---------------+---------+-----------+----------+--------------+ FV DistalFull                                                        +---------+---------------+---------+-----------+----------+--------------+ PFV      Full                                                        +---------+---------------+---------+-----------+----------+--------------+  POP      Full           Yes      Yes                                 +---------+---------------+---------+-----------+----------+--------------+ PTV      Full                                                        +---------+---------------+---------+-----------+----------+--------------+ PERO     Full                                                        +---------+---------------+---------+-----------+----------+--------------+    Summary: RIGHT: - No evidence of common femoral vein obstruction.  LEFT: - There is no evidence of deep vein thrombosis in the lower extremity.  - A cystic structure is found in the popliteal fossa.  *See table(s) above for measurements and observations. Electronically signed by Heath Lark on 07/21/2021 at 8:22:24 PM.    Final    VAS [place] LOWER EXTREMITY VENOUS (DVT)  Result Date: 07/08/2021  Lower Venous DVT Study Patient Name:  CHEETARA JOLLIFFE  Date of Exam:   07/08/2021 Medical Rec #:  [id number]          Accession #:    [id number] Date of Birth: 08-03-1950           Patient Gender: F Patient Age:   23 years Exam Location:  [facility] Procedure:      VAS [place] LOWER EXTREMITY VENOUS (DVT) Referring Phys: PRANAV PATEL --------------------------------------------------------------------------------  Indications: Edema.  Comparison Study: no prior Performing Technologist: Argentina Ponder RVS  Examination Guidelines: A complete evaluation includes B-mode imaging, spectral Doppler, color Doppler, and power Doppler as needed of all accessible portions of each vessel. Bilateral testing is considered an integral part of a complete examination. Limited examinations for reoccurring indications may be performed as noted. The reflux portion of the exam is performed with the patient in reverse Trendelenburg.  +-----+---------------+---------+-----------+----------+--------------+ RIGHTCompressibilityPhasicitySpontaneityPropertiesThrombus Aging +-----+---------------+---------+-----------+----------+--------------+ CFV  Full           Yes      Yes                                 +-----+---------------+---------+-----------+----------+--------------+   +---------+---------------+---------+-----------+----------+--------------+ LEFT     CompressibilityPhasicitySpontaneityPropertiesThrombus Aging +---------+---------------+---------+-----------+----------+--------------+ CFV      Full           Yes      Yes                                 +---------+---------------+---------+-----------+----------+--------------+ SFJ      Full                                                        +---------+---------------+---------+-----------+----------+--------------+  FV Prox  Full                                                        +---------+---------------+---------+-----------+----------+--------------+ FV Mid   Full                                                         +---------+---------------+---------+-----------+----------+--------------+ FV DistalFull                                                        +---------+---------------+---------+-----------+----------+--------------+ PFV      Full                                                        +---------+---------------+---------+-----------+----------+--------------+ POP      Full           Yes      Yes                                 +---------+---------------+---------+-----------+----------+--------------+ PTV      Full                                                        +---------+---------------+---------+-----------+----------+--------------+ PERO     Full                                                        +---------+---------------+---------+-----------+----------+--------------+     Summary: RIGHT: - No evidence of common femoral vein obstruction.  LEFT: - There is no evidence of deep vein thrombosis in the lower extremity.  - No cystic structure found in the popliteal fossa.  *See table(s) above for measurements and observations. Electronically signed by Sherald Hess MD on 07/08/2021 at 1:40:12 PM.    Final     Microbiology: Recent Results (from the past 240 hour(s))  Culture, blood (Routine X 2) w Reflex to ID Panel     Status: None   Collection Time: 07/20/21  1:00 AM   Specimen: BLOOD  Result Value Ref Range Status   Specimen Description   Final    BLOOD RIGHT ANTECUBITAL Performed at [facility], [address] [place], [place] [address]    Special Requests   Final    BOTTLES DRAWN AEROBIC ONLY Blood Culture adequate volume Performed at [facility], [address], [place], [place] [address]    Culture   Final  NO GROWTH 5 DAYS Performed at [facility], [address] [place], [place] [address]    Report Status 07/25/2021 FINAL  Final  Culture, blood (Routine X 2) w Reflex to ID Panel      Status: None   Collection Time: 07/20/21  1:00 AM   Specimen: BLOOD  Result Value Ref Range Status   Specimen Description   Final    BLOOD LEFT ANTECUBITAL Performed at [facility], [address] [place], [place] [address]    Special Requests   Final    BOTTLES DRAWN AEROBIC ONLY Blood Culture adequate volume Performed at [facility], [address] [place], [place] [address]    Culture   Final    NO GROWTH 5 DAYS Performed at [facility], [address] [place], [place] [address]    Report Status 07/25/2021 FINAL  Final     Labs: Basic Metabolic Panel: Recent Labs  Lab 07/22/21 0340 07/23/21 0435 07/24/21 0442 07/25/21 0359 07/27/21 0344  NA 140 140 141 142 139  K 4.5 3.9 2.9* 4.1 3.9  CL 111 108 104 108 104  CO2 22 26 28 27 26   GLUCOSE 99 85 88 93 95  BUN 16 13 17 17  26*  CREATININE 1.43* 1.30* 1.43* 1.49* 1.58*  CALCIUM 8.1* 8.5* 9.0 9.3 9.2   Liver Function Tests: No results for input(s): AST, ALT, ALKPHOS, BILITOT, PROT, ALBUMIN in the last 168 hours. No results for input(s): LIPASE, AMYLASE in the last 168 hours. No results for input(s): AMMONIA in the last 168 hours. CBC: Recent Labs  Lab 07/22/21 0340 07/23/21 0435 07/24/21 0442  WBC 10.0 10.0 8.6  HGB 8.1* 9.7* 9.6*  HCT 26.1* 31.0* 30.0*  MCV 102.0* 99.7 98.4  PLT 311 372 398   Cardiac Enzymes: No results for input(s): CKTOTAL, CKMB, CKMBINDEX, TROPONINI in the last 168 hours. BNP: BNP (last 3 results) No results for input(s): BNP in the last 8760 hours.  ProBNP (last 3 results) No results for input(s): PROBNP in the last 8760 hours.  CBG: Recent Labs  Lab 07/21/21 2124  GLUCAP 172*       Signed:  Darlin Drop, MD [facility] 07/28/2021, 10:46 AM

## 2021-07-28 NOTE — Progress Notes (Signed)
Assessment unchanged. Pt and husband verbalized understanding of dc instructions through teach back including medications and follow up care. Discharged via wc to front entrance accompanied by NT and husband. ?

## 2021-07-31 DIAGNOSIS — M533 Sacrococcygeal disorders, not elsewhere classified: Secondary | ICD-10-CM | POA: Diagnosis not present

## 2021-07-31 DIAGNOSIS — K5903 Drug induced constipation: Secondary | ICD-10-CM | POA: Diagnosis not present

## 2021-07-31 DIAGNOSIS — G8929 Other chronic pain: Secondary | ICD-10-CM | POA: Diagnosis not present

## 2021-07-31 DIAGNOSIS — M5442 Lumbago with sciatica, left side: Secondary | ICD-10-CM | POA: Diagnosis not present

## 2021-07-31 DIAGNOSIS — M5441 Lumbago with sciatica, right side: Secondary | ICD-10-CM | POA: Diagnosis not present

## 2021-07-31 DIAGNOSIS — M62838 Other muscle spasm: Secondary | ICD-10-CM | POA: Diagnosis not present

## 2021-07-31 DIAGNOSIS — M545 Low back pain, unspecified: Secondary | ICD-10-CM | POA: Diagnosis not present

## 2021-08-06 DIAGNOSIS — L0390 Cellulitis, unspecified: Secondary | ICD-10-CM | POA: Diagnosis not present

## 2021-08-06 DIAGNOSIS — D649 Anemia, unspecified: Secondary | ICD-10-CM | POA: Diagnosis not present

## 2021-08-06 DIAGNOSIS — N179 Acute kidney failure, unspecified: Secondary | ICD-10-CM | POA: Diagnosis not present

## 2021-08-06 DIAGNOSIS — L03116 Cellulitis of left lower limb: Secondary | ICD-10-CM | POA: Diagnosis not present

## 2021-08-06 DIAGNOSIS — R197 Diarrhea, unspecified: Secondary | ICD-10-CM | POA: Diagnosis not present

## 2021-08-13 ENCOUNTER — Telehealth: Payer: Self-pay

## 2021-08-13 NOTE — Telephone Encounter (Signed)
Received call from [place] at Dr. Raul Del office requesting sooner appointment time for patient as she is not tolerating antibiotics. Rescheduled patient for 5/18. ? ?Beryle Flock, RN ? ?

## 2021-08-15 ENCOUNTER — Other Ambulatory Visit: Payer: Self-pay

## 2021-08-15 ENCOUNTER — Encounter: Payer: Self-pay | Admitting: Internal Medicine

## 2021-08-15 ENCOUNTER — Ambulatory Visit (HOSPITAL_COMMUNITY)
Admission: RE | Admit: 2021-08-15 | Discharge: 2021-08-15 | Disposition: A | Discharge status: Home / Self Care | Payer: PPO | Source: Ambulatory Visit | Attending: Internal Medicine | Admitting: Internal Medicine

## 2021-08-15 ENCOUNTER — Ambulatory Visit (INDEPENDENT_AMBULATORY_CARE_PROVIDER_SITE_OTHER): Payer: PPO | Admitting: Internal Medicine

## 2021-08-15 ENCOUNTER — Other Ambulatory Visit: Payer: Self-pay | Admitting: Internal Medicine

## 2021-08-15 VITALS — BP 145/54 | HR 89 | Temp 98.0°F

## 2021-08-15 DIAGNOSIS — R600 Localized edema: Secondary | ICD-10-CM | POA: Diagnosis not present

## 2021-08-15 DIAGNOSIS — L03119 Cellulitis of unspecified part of limb: Secondary | ICD-10-CM

## 2021-08-15 DIAGNOSIS — L97929 Non-pressure chronic ulcer of unspecified part of left lower leg with unspecified severity: Secondary | ICD-10-CM | POA: Diagnosis not present

## 2021-08-15 DIAGNOSIS — L97928 Non-pressure chronic ulcer of unspecified part of left lower leg with other specified severity: Secondary | ICD-10-CM | POA: Diagnosis not present

## 2021-08-15 MED ORDER — CEPHALEXIN 500 MG PO CAPS: 500.0000 mg | ORAL_CAPSULE | Freq: Three times a day (TID) | ORAL | 0 refills | Status: DC

## 2021-08-15 MED ORDER — FUROSEMIDE 20 MG PO TABS: 20.0000 mg | ORAL_TABLET | Freq: Every day | ORAL | 0 refills | Status: DC

## 2021-08-15 MED ORDER — CLOTRIMAZOLE 1 % EX CREA: 1.0000 "application " | TOPICAL_CREAM | Freq: Two times a day (BID) | CUTANEOUS | 0 refills | Status: DC

## 2021-08-15 NOTE — Progress Notes (Signed)
RFV: Follow up for cellulitis related hospitalizatoin      Patient ID: Connie Bennett, female   DOB: 1951/03/15, 71 y.o.   MRN: [id number]  HPI Connie Bennett is a 70yo F with recurrent cellulitis to left leg and hospitalized recently for cellulitis. She has finished oral abtx and Still wearing compression sock to left leg Feels left leg swollen today. Increased erythema. No fever  Outpatient Encounter Medications as of 08/15/2021  Medication Sig   acetaminophen (TYLENOL) 500 MG tablet Take 1,000 mg by mouth every 6 (six) hours as needed (pain).   ALPRAZolam (XANAX) 1 MG tablet Take 1 mg by mouth 3 (three) times daily as needed for anxiety or sleep (nerves).   anastrozole (ARIMIDEX) 1 MG tablet TAKE 1 TABLET BY MOUTH EVERY DAY (Patient taking differently: Take 1 mg by mouth at bedtime.)   Ascorbic Acid (VITAMIN C) 1000 MG tablet Take 1,000 mg by mouth daily with lunch.   B Complex-C (B-COMPLEX WITH VITAMIN C) tablet Take 1 tablet by mouth daily.   brexpiprazole (REXULTI) 1 MG TABS tablet Take 1 mg by mouth every morning.   calcium carbonate (OS-CAL) 600 MG TABS tablet Take 600 mg by mouth daily with lunch.   Cholecalciferol (VITAMIN D) 50 MCG (2000 UT) tablet Take 2,000 Units by mouth daily with lunch.   ezetimibe (ZETIA) 10 MG tablet Take 10 mg by mouth at bedtime.    ferrous sulfate 325 (65 FE) MG tablet Take 1 tablet (325 mg total) by mouth daily with breakfast.   Flaxseed, Linseed, (FLAX SEED OIL) 1000 MG CAPS Take 1,000 mg by mouth daily with lunch.   furosemide (LASIX) 40 MG tablet Take 40 mg by mouth daily as needed for fluid or edema (leg swelling). Reported on 06/29/2015   Lactobacillus (PROBIOTIC ACIDOPHILUS PO) Take 1 tablet by mouth daily with lunch.   lidocaine (LIDODERM) 5 % Place 1 patch onto the skin at bedtime as needed (pain).   meclizine (ANTIVERT) 25 MG tablet Take 25 mg by mouth 4 (four) times daily as needed for dizziness.   Omega-3 Fatty Acids (FISH OIL) 1000 MG CAPS Take  1,000 mg by mouth daily with lunch.   oxyCODONE-acetaminophen (PERCOCET) 10-325 MG tablet Take 1 tablet by mouth 5 (five) times daily as needed for pain.   polyethylene glycol (MIRALAX / GLYCOLAX) 17 g packet Take 17 g by mouth daily as needed for mild constipation.   PROAIR HFA 108 (90 Base) MCG/ACT inhaler Inhale 2 puffs into the lungs every 4 (four) hours as needed for wheezing or shortness of breath.   ramipril (ALTACE) 10 MG capsule Take 10 mg by mouth every morning.   rosuvastatin (CRESTOR) 20 MG tablet Take 20 mg by mouth at bedtime.   venlafaxine (EFFEXOR-XR) 150 MG 24 hr capsule Take 150 mg by mouth 2 (two) times daily with breakfast and lunch.   gabapentin (NEURONTIN) 600 MG tablet Take 1 tablet (600 mg total) by mouth at bedtime for 14 days. Take one tablet (600 mg) by mouth every morning and two tablets (1200 mg) at night   No facility-administered encounter medications on file as of 08/15/2021.     Patient Active Problem List   Diagnosis Date Noted   Cellulitis of left lower extremity 07/20/2021   Mixed hyperlipidemia 07/20/2021   Major depressive disorder 07/20/2021   Acute renal failure superimposed on stage 3a chronic kidney disease ([place]) 07/20/2021   Hypokalemia 07/20/2021   Malnutrition of moderate degree 07/08/2021   SIRS (systemic inflammatory  response syndrome) ([place]) 07/07/2021   Osteopenia 11/30/2015   Anemia of chronic disease 08/31/2015   Breast cancer of upper-inner quadrant of left female breast ([place]) 06/29/2015   Status post mastectomy 01/25/2015   Preoperative clearance 12/15/2014   Right bundle branch block 12/15/2014   Essential hypertension 12/15/2014   Mitral valve prolapse 12/15/2014   Breast cancer of upper-outer quadrant of right female breast ([place]) 09/04/2014   Complex sleep apnea syndrome 11/14/2010     Health Maintenance Due  Topic Date Due   Pneumonia Vaccine 88+ Years old (1 - PCV) 04/07/1955   Hepatitis C Screening  Never done   Zoster  Vaccines- Shingrix (1 of 2) Never done   COLONOSCOPY (Pts 45-40yr Insurance coverage will need to be confirmed)  Never done   MAMMOGRAM  Never done   TETANUS/TDAP  02/28/2013   COVID-19 Vaccine (4 - Booster for Pfizer series) 03/02/2020     Review of Systems 12 point ros is negative except what is mentioned above Physical Exam   BP (!) 145/54   Pulse 89   Temp 98 F (36.7 C) (Temporal)    Gen =a  x o by4 in nad Ext= pitting edema +1 to left lower leg, Skin = blanching erythema, not much improved since recent hospitalization. Some flaking of skin at heel.  CBC Lab Results  Component Value Date   WBC 8.6 07/24/2021   RBC 3.05 (L) 07/24/2021   HGB 9.6 (L) 07/24/2021   HCT 30.0 (L) 07/24/2021   PLT 398 07/24/2021   MCV 98.4 07/24/2021   MCH 31.5 07/24/2021   MCHC 32.0 07/24/2021   RDW 13.2 07/24/2021   LYMPHSABS 2.0 07/20/2021   MONOABS 1.3 (H) 07/20/2021   EOSABS 0.3 07/20/2021    BMET Lab Results  Component Value Date   NA 139 07/27/2021   K 3.9 07/27/2021   CL 104 07/27/2021   CO2 26 07/27/2021   GLUCOSE 95 07/27/2021   BUN 26 (H) 07/27/2021   CREATININE 1.58 (H) 07/27/2021   CALCIUM 9.2 07/27/2021   GFRNONAA 35 (L) 07/27/2021   GFRAA 46 (L) 10/14/2019      Assessment and Plan  Has some LE edema that is contributing to her erythema - will check abi. Will give a few doses of lasix to see if improved.  Cellulitis = Will do cbc to see if elevated to see if more consistent with cellulitis.  Will do a trial of topical antifungal to see if any difference noted  Would like to treat course of 7d cefadroxil, elevate limb, compression socks  Return in 2 wk

## 2021-08-16 LAB — CBC WITH DIFFERENTIAL/PLATELET
Absolute Monocytes: 718 cells/uL (ref 200–950)
Basophils Absolute: 28 cells/uL (ref 0–200)
Basophils Relative: 0.3 %
Eosinophils Absolute: 736 cells/uL — ABNORMAL HIGH (ref 15–500)
Eosinophils Relative: 8 %
HCT: 28.8 % — ABNORMAL LOW (ref 35.0–45.0)
Hemoglobin: 9.4 g/dL — ABNORMAL LOW (ref 11.7–15.5)
Lymphs Abs: 1518 cells/uL (ref 850–3900)
MCH: 31.6 pg (ref 27.0–33.0)
MCHC: 32.6 g/dL (ref 32.0–36.0)
MCV: 97 fL (ref 80.0–100.0)
MPV: 11.3 fL (ref 7.5–12.5)
Monocytes Relative: 7.8 %
Neutro Abs: 6201 cells/uL (ref 1500–7800)
Neutrophils Relative %: 67.4 %
Platelets: 264 10*3/uL (ref 140–400)
RBC: 2.97 10*6/uL — ABNORMAL LOW (ref 3.80–5.10)
RDW: 12.4 % (ref 11.0–15.0)
Total Lymphocyte: 16.5 %
WBC: 9.2 10*3/uL (ref 3.8–10.8)

## 2021-08-16 LAB — BASIC METABOLIC PANEL
BUN/Creatinine Ratio: 12 (calc) (ref 6–22)
BUN: 22 mg/dL (ref 7–25)
CO2: 25 mmol/L (ref 20–32)
Calcium: 9.6 mg/dL (ref 8.6–10.4)
Chloride: 106 mmol/L (ref 98–110)
Creat: 1.87 mg/dL — ABNORMAL HIGH (ref 0.60–1.00)
Glucose, Bld: 88 mg/dL (ref 65–99)
Potassium: 4.7 mmol/L (ref 3.5–5.3)
Sodium: 139 mmol/L (ref 135–146)

## 2021-08-19 ENCOUNTER — Telehealth: Payer: Self-pay

## 2021-08-19 DIAGNOSIS — R197 Diarrhea, unspecified: Secondary | ICD-10-CM

## 2021-08-19 NOTE — Addendum Note (Signed)
Addended by: Lucie Leather D on: 08/19/2021 04:10 PM   Modules accepted: Orders

## 2021-08-19 NOTE — Telephone Encounter (Signed)
Per Dr. Baxter Flattery, ABI was unremarkable - relayed this to patient. Dr. Baxter Flattery would like to test for C.diff. Notified patient, she will pick up stool kit at front desk tomorrow 08/20/21.  Beryle Flock, RN

## 2021-08-19 NOTE — Telephone Encounter (Signed)
Patient left voicemail requesting callback to discuss ABI ultrasound results. Message sent to Dr. Baxter Flattery.   Beryle Flock, RN

## 2021-08-19 NOTE — Telephone Encounter (Signed)
Patient called again requesting to discuss ultrasound results with Dr. Baxter Flattery.  Patient also states she is having diarrhea 3-4 times per day while taking her clindamycin. States she began taking probiotics, but noticed little improvement. Asks if there is another medication she can take to help with the diarrhea.  Will route message to clinical pharmacists and provider.  Binnie Kand, RN

## 2021-08-20 ENCOUNTER — Inpatient Hospital Stay: Payer: PPO | Admitting: Internal Medicine

## 2021-08-29 ENCOUNTER — Encounter: Payer: Self-pay | Admitting: Internal Medicine

## 2021-08-29 ENCOUNTER — Other Ambulatory Visit: Payer: Self-pay

## 2021-08-29 ENCOUNTER — Ambulatory Visit (INDEPENDENT_AMBULATORY_CARE_PROVIDER_SITE_OTHER): Payer: PPO | Admitting: Internal Medicine

## 2021-08-29 VITALS — BP 108/67 | HR 66 | Resp 16 | Ht 62.0 in | Wt 115.0 lb

## 2021-08-29 DIAGNOSIS — R600 Localized edema: Secondary | ICD-10-CM

## 2021-08-29 DIAGNOSIS — N183 Chronic kidney disease, stage 3 unspecified: Secondary | ICD-10-CM

## 2021-08-29 DIAGNOSIS — L03119 Cellulitis of unspecified part of limb: Secondary | ICD-10-CM

## 2021-08-29 DIAGNOSIS — I878 Other specified disorders of veins: Secondary | ICD-10-CM

## 2021-08-29 NOTE — Patient Instructions (Signed)
Place for compression socks outlet store:  [employer] Address: [address], [place], [place] [address] Hours:  Closed ? Opens 8:30?AM Fri Confirmed by phone call 5 weeks ago Phone: [phone]

## 2021-08-29 NOTE — Progress Notes (Signed)
RFV: follow up for left lower leg cellulitis  Patient ID: Connie Bennett, female   DOB: 04/08/1950, 71 y.o.   MRN: [id number]  HPI Connie Bennett is a 71yo F with hx of recurrent cellulitis to Left leg who has received treatment with linezolid plus working on improvement with using compression socks. Today's visit, her left leg looks improved. There is noticeable Less leg swelling and less erythema.   Outpatient Encounter Medications as of 08/29/2021  Medication Sig   acetaminophen (TYLENOL) 500 MG tablet Take 1,000 mg by mouth every 6 (six) hours as needed (pain).   ALPRAZolam (XANAX) 1 MG tablet Take 1 mg by mouth 3 (three) times daily as needed for anxiety or sleep (nerves).   anastrozole (ARIMIDEX) 1 MG tablet TAKE 1 TABLET BY MOUTH EVERY DAY (Patient taking differently: Take 1 mg by mouth at bedtime.)   Ascorbic Acid (VITAMIN C) 1000 MG tablet Take 1,000 mg by mouth daily with lunch.   B Complex-C (B-COMPLEX WITH VITAMIN C) tablet Take 1 tablet by mouth daily.   brexpiprazole (REXULTI) 1 MG TABS tablet Take 1 mg by mouth every morning.   calcium carbonate (OS-CAL) 600 MG TABS tablet Take 600 mg by mouth daily with lunch.   cephALEXin (KEFLEX) 500 MG capsule Take 500 mg by mouth 3 (three) times daily.   Cholecalciferol (VITAMIN D) 50 MCG (2000 UT) tablet Take 2,000 Units by mouth daily with lunch.   clotrimazole (CLOTRIMAZOLE ANTI-FUNGAL) 1 % cream Apply 1 application. topically 2 (two) times daily.   ezetimibe (ZETIA) 10 MG tablet Take 10 mg by mouth at bedtime.    ferrous sulfate 325 (65 FE) MG tablet Take 1 tablet (325 mg total) by mouth daily with breakfast.   Flaxseed, Linseed, (FLAX SEED OIL) 1000 MG CAPS Take 1,000 mg by mouth daily with lunch.   furosemide (LASIX) 20 MG tablet Take 1 tablet (20 mg total) by mouth daily. As needed for lower extremity swelling   furosemide (LASIX) 40 MG tablet Take 40 mg by mouth daily as needed for fluid or edema (leg swelling). Reported on 06/29/2015    Lactobacillus (PROBIOTIC ACIDOPHILUS PO) Take 1 tablet by mouth daily with lunch.   lidocaine (LIDODERM) 5 % Place 1 patch onto the skin at bedtime as needed (pain).   meclizine (ANTIVERT) 25 MG tablet Take 25 mg by mouth 4 (four) times daily as needed for dizziness.   metaxalone (SKELAXIN) 800 MG tablet Take by mouth.   naloxegol oxalate (MOVANTIK) 25 MG TABS tablet Take by mouth.   Omega-3 Fatty Acids (FISH OIL) 1000 MG CAPS Take 1,000 mg by mouth daily with lunch.   oxyCODONE-acetaminophen (PERCOCET) 10-325 MG tablet Take 1 tablet by mouth 5 (five) times daily as needed for pain.   polyethylene glycol (MIRALAX / GLYCOLAX) 17 g packet Take 17 g by mouth daily as needed for mild constipation.   PROAIR HFA 108 (90 Base) MCG/ACT inhaler Inhale 2 puffs into the lungs every 4 (four) hours as needed for wheezing or shortness of breath.   ramipril (ALTACE) 10 MG capsule Take 10 mg by mouth every morning.   rosuvastatin (CRESTOR) 20 MG tablet Take 20 mg by mouth at bedtime.   venlafaxine (EFFEXOR-XR) 150 MG 24 hr capsule Take 150 mg by mouth 2 (two) times daily with breakfast and lunch.   [DISCONTINUED] cefadroxil (DURICEF) 500 MG capsule SMARTSIG:2 Capsule(s) By Mouth Every 12 Hours   gabapentin (NEURONTIN) 600 MG tablet Take 1 tablet (600 mg total) by  mouth at bedtime for 14 days. Take one tablet (600 mg) by mouth every morning and two tablets (1200 mg) at night   [DISCONTINUED] cephALEXin (KEFLEX) 500 MG capsule Take 1 capsule (500 mg total) by mouth 3 (three) times daily.   [DISCONTINUED] MOVANTIK 25 MG TABS tablet Take 25 mg by mouth every morning.   No facility-administered encounter medications on file as of 08/29/2021.     Patient Active Problem List   Diagnosis Date Noted   Cellulitis of left lower extremity 07/20/2021   Mixed hyperlipidemia 07/20/2021   Major depressive disorder 07/20/2021   Acute renal failure superimposed on stage 3a chronic kidney disease ([place]) 07/20/2021    Hypokalemia 07/20/2021   Malnutrition of moderate degree 07/08/2021   SIRS (systemic inflammatory response syndrome) ([place]) 07/07/2021   Osteopenia 11/30/2015   Anemia of chronic disease 08/31/2015   Breast cancer of upper-inner quadrant of left female breast ([place]) 06/29/2015   Status post mastectomy 01/25/2015   Preoperative clearance 12/15/2014   Right bundle branch block 12/15/2014   Essential hypertension 12/15/2014   Mitral valve prolapse 12/15/2014   Breast cancer of upper-outer quadrant of right female breast ([place]) 09/04/2014   Complex sleep apnea syndrome 11/14/2010     Health Maintenance Due  Topic Date Due   Pneumonia Vaccine 51+ Years old (1 - PCV) 04/07/1955   Hepatitis C Screening  Never done   TETANUS/TDAP  Never done   Zoster Vaccines- Shingrix (1 of 2) Never done   COLONOSCOPY (Pts 45-92yr Insurance coverage will need to be confirmed)  Never done   MAMMOGRAM  Never done   COVID-19 Vaccine (3 - Pfizer risk series) 06/27/2019     Review of Systems Review of Systems  Constitutional: Negative for fever, chills, diaphoresis, activity change, appetite change, fatigue and unexpected weight change.  HENT: Negative for congestion, sore throat, rhinorrhea, sneezing, trouble swallowing and sinus pressure.  Eyes: Negative for photophobia and visual disturbance.  Respiratory: Negative for cough, chest tightness, shortness of breath, wheezing and stridor.  Cardiovascular: Negative for chest pain, palpitations and leg swelling.  Gastrointestinal: Negative for nausea, vomiting, abdominal pain, diarrhea, constipation, blood in stool, abdominal distention and anal bleeding.  Genitourinary: Negative for dysuria, hematuria, flank pain and difficulty urinating.  Musculoskeletal: Negative for myalgias, back pain, joint swelling, arthralgias and gait problem.  Skin: Negative for color change, pallor, rash and wound.  Neurological: Negative for dizziness, tremors, weakness and  light-headedness.  Hematological: Negative for adenopathy. Does not bruise/bleed easily.  Psychiatric/Behavioral: Negative for behavioral problems, confusion, sleep disturbance, dysphoric mood, decreased concentration and agitation.   Physical Exam   BP 108/67   Pulse 66   Resp 16   Ht '5\' 2"'$  (1.575 m)   Wt 115 lb (52.2 kg)   SpO2 100%   BMI 21.03 kg/m   Physical Exam  Constitutional:  oriented to person, place, and time. appears well-developed and well-nourished. No distress.  HENT: [place]/AT, PERRLA, no scleral icterus Ext: trace edema. Signs of varicose veings  Skin: Skin is warm and dry. No rash noted. No erythema. Dry flaky heal noted to both feet Psychiatric: a normal mood and affect.  behavior is normal.     CBC Lab Results  Component Value Date   WBC 9.2 08/15/2021   RBC 2.97 (L) 08/15/2021   HGB 9.4 (L) 08/15/2021   HCT 28.8 (L) 08/15/2021   PLT 264 08/15/2021   MCV 97.0 08/15/2021   MCH 31.6 08/15/2021   MCHC 32.6 08/15/2021   RDW  12.4 08/15/2021   LYMPHSABS 1,518 08/15/2021   MONOABS 1.3 (H) 07/20/2021   EOSABS 736 (H) 08/15/2021    BMET Lab Results  Component Value Date   NA 139 08/15/2021   K 4.7 08/15/2021   CL 106 08/15/2021   CO2 25 08/15/2021   GLUCOSE 88 08/15/2021   BUN 22 08/15/2021   CREATININE 1.87 (H) 08/15/2021   CALCIUM 9.6 08/15/2021   GFRNONAA 35 (L) 07/27/2021   GFRAA 46 (L) 10/14/2019      Assessment and Plan Left lower leg Cellulitis resolve. No need for further abtx  Venous congestion/lower extremity edema = recommend to continue to wear compression socks when she is up on her feet  Ckd 3= stable on most recent labs   Rtc as needed

## 2021-09-09 ENCOUNTER — Telehealth: Payer: Self-pay

## 2021-09-09 NOTE — Telephone Encounter (Signed)
Received call from patient c/o 7-8/10 stomach pain. States pain began since she stopped antibiotics after her last appointment with Dr. Baxter Flattery on 6/1. States if she eats anything (even crackers) she has to go to the bathroom. Denies diarrhea, fever, abnormal stool.   Encouraged patient to reach out to her primary care doctor or urgent care for an appointment. Patient stated understanding and confirmed she would seek care.  Binnie Kand, RN

## 2021-09-10 DIAGNOSIS — R197 Diarrhea, unspecified: Secondary | ICD-10-CM | POA: Diagnosis not present

## 2021-09-24 DIAGNOSIS — N183 Chronic kidney disease, stage 3 unspecified: Secondary | ICD-10-CM | POA: Diagnosis not present

## 2021-09-24 DIAGNOSIS — A498 Other bacterial infections of unspecified site: Secondary | ICD-10-CM | POA: Diagnosis not present

## 2021-09-24 DIAGNOSIS — Z720 Tobacco use: Secondary | ICD-10-CM | POA: Diagnosis not present

## 2021-09-24 DIAGNOSIS — I129 Hypertensive chronic kidney disease with stage 1 through stage 4 chronic kidney disease, or unspecified chronic kidney disease: Secondary | ICD-10-CM | POA: Diagnosis not present

## 2021-09-24 DIAGNOSIS — E782 Mixed hyperlipidemia: Secondary | ICD-10-CM | POA: Diagnosis not present

## 2021-09-24 DIAGNOSIS — D649 Anemia, unspecified: Secondary | ICD-10-CM | POA: Diagnosis not present

## 2021-10-08 DIAGNOSIS — F3181 Bipolar II disorder: Secondary | ICD-10-CM | POA: Diagnosis not present

## 2021-10-23 DIAGNOSIS — Z5181 Encounter for therapeutic drug level monitoring: Secondary | ICD-10-CM | POA: Diagnosis not present

## 2021-10-23 DIAGNOSIS — M545 Low back pain, unspecified: Secondary | ICD-10-CM | POA: Diagnosis not present

## 2021-10-23 DIAGNOSIS — G8929 Other chronic pain: Secondary | ICD-10-CM | POA: Diagnosis not present

## 2021-10-23 DIAGNOSIS — K5903 Drug induced constipation: Secondary | ICD-10-CM | POA: Diagnosis not present

## 2021-10-23 DIAGNOSIS — M533 Sacrococcygeal disorders, not elsewhere classified: Secondary | ICD-10-CM | POA: Diagnosis not present

## 2021-10-23 DIAGNOSIS — M62838 Other muscle spasm: Secondary | ICD-10-CM | POA: Diagnosis not present

## 2021-10-23 DIAGNOSIS — F1190 Opioid use, unspecified, uncomplicated: Secondary | ICD-10-CM | POA: Diagnosis not present

## 2021-10-23 DIAGNOSIS — G894 Chronic pain syndrome: Secondary | ICD-10-CM | POA: Diagnosis not present

## 2021-10-23 DIAGNOSIS — Z79899 Other long term (current) drug therapy: Secondary | ICD-10-CM | POA: Diagnosis not present

## 2021-11-04 DIAGNOSIS — H3554 Dystrophies primarily involving the retinal pigment epithelium: Secondary | ICD-10-CM | POA: Diagnosis not present

## 2021-11-04 DIAGNOSIS — H2513 Age-related nuclear cataract, bilateral: Secondary | ICD-10-CM | POA: Diagnosis not present

## 2021-11-18 DIAGNOSIS — M531 Cervicobrachial syndrome: Secondary | ICD-10-CM | POA: Diagnosis not present

## 2021-11-18 DIAGNOSIS — S134XXA Sprain of ligaments of cervical spine, initial encounter: Secondary | ICD-10-CM | POA: Diagnosis not present

## 2021-11-18 DIAGNOSIS — M542 Cervicalgia: Secondary | ICD-10-CM | POA: Diagnosis not present

## 2021-11-18 DIAGNOSIS — M9902 Segmental and somatic dysfunction of thoracic region: Secondary | ICD-10-CM | POA: Diagnosis not present

## 2021-11-18 DIAGNOSIS — M9901 Segmental and somatic dysfunction of cervical region: Secondary | ICD-10-CM | POA: Diagnosis not present

## 2021-11-18 DIAGNOSIS — M5032 Other cervical disc degeneration, mid-cervical region, unspecified level: Secondary | ICD-10-CM | POA: Diagnosis not present

## 2021-11-18 DIAGNOSIS — S233XXA Sprain of ligaments of thoracic spine, initial encounter: Secondary | ICD-10-CM | POA: Diagnosis not present

## 2021-11-18 DIAGNOSIS — M5413 Radiculopathy, cervicothoracic region: Secondary | ICD-10-CM | POA: Diagnosis not present

## 2021-11-18 DIAGNOSIS — G4489 Other headache syndrome: Secondary | ICD-10-CM | POA: Diagnosis not present

## 2021-11-19 ENCOUNTER — Inpatient Hospital Stay: Payer: PPO

## 2021-11-19 ENCOUNTER — Inpatient Hospital Stay: Payer: PPO | Admitting: Nurse Practitioner

## 2021-11-19 ENCOUNTER — Other Ambulatory Visit: Payer: Self-pay

## 2021-11-19 DIAGNOSIS — C50212 Malignant neoplasm of upper-inner quadrant of left female breast: Secondary | ICD-10-CM

## 2021-11-19 DIAGNOSIS — C50411 Malignant neoplasm of upper-outer quadrant of right female breast: Secondary | ICD-10-CM

## 2021-11-20 DIAGNOSIS — M531 Cervicobrachial syndrome: Secondary | ICD-10-CM | POA: Diagnosis not present

## 2021-11-20 DIAGNOSIS — M542 Cervicalgia: Secondary | ICD-10-CM | POA: Diagnosis not present

## 2021-11-20 DIAGNOSIS — M5032 Other cervical disc degeneration, mid-cervical region, unspecified level: Secondary | ICD-10-CM | POA: Diagnosis not present

## 2021-11-20 DIAGNOSIS — M5413 Radiculopathy, cervicothoracic region: Secondary | ICD-10-CM | POA: Diagnosis not present

## 2021-11-20 DIAGNOSIS — S233XXA Sprain of ligaments of thoracic spine, initial encounter: Secondary | ICD-10-CM | POA: Diagnosis not present

## 2021-11-20 DIAGNOSIS — M9901 Segmental and somatic dysfunction of cervical region: Secondary | ICD-10-CM | POA: Diagnosis not present

## 2021-11-20 DIAGNOSIS — M9902 Segmental and somatic dysfunction of thoracic region: Secondary | ICD-10-CM | POA: Diagnosis not present

## 2021-11-20 DIAGNOSIS — S134XXA Sprain of ligaments of cervical spine, initial encounter: Secondary | ICD-10-CM | POA: Diagnosis not present

## 2021-11-20 DIAGNOSIS — G4489 Other headache syndrome: Secondary | ICD-10-CM | POA: Diagnosis not present

## 2021-11-21 DIAGNOSIS — M9901 Segmental and somatic dysfunction of cervical region: Secondary | ICD-10-CM | POA: Diagnosis not present

## 2021-11-21 DIAGNOSIS — S233XXA Sprain of ligaments of thoracic spine, initial encounter: Secondary | ICD-10-CM | POA: Diagnosis not present

## 2021-11-21 DIAGNOSIS — G4489 Other headache syndrome: Secondary | ICD-10-CM | POA: Diagnosis not present

## 2021-11-21 DIAGNOSIS — M9902 Segmental and somatic dysfunction of thoracic region: Secondary | ICD-10-CM | POA: Diagnosis not present

## 2021-11-21 DIAGNOSIS — S134XXA Sprain of ligaments of cervical spine, initial encounter: Secondary | ICD-10-CM | POA: Diagnosis not present

## 2021-11-21 DIAGNOSIS — M531 Cervicobrachial syndrome: Secondary | ICD-10-CM | POA: Diagnosis not present

## 2021-11-21 DIAGNOSIS — M5032 Other cervical disc degeneration, mid-cervical region, unspecified level: Secondary | ICD-10-CM | POA: Diagnosis not present

## 2021-11-21 DIAGNOSIS — M542 Cervicalgia: Secondary | ICD-10-CM | POA: Diagnosis not present

## 2021-11-21 DIAGNOSIS — M5413 Radiculopathy, cervicothoracic region: Secondary | ICD-10-CM | POA: Diagnosis not present

## 2021-11-25 ENCOUNTER — Other Ambulatory Visit: Payer: Self-pay

## 2021-11-25 ENCOUNTER — Inpatient Hospital Stay: Payer: PPO

## 2021-11-25 ENCOUNTER — Inpatient Hospital Stay: Payer: PPO | Attending: Hematology

## 2021-11-25 ENCOUNTER — Inpatient Hospital Stay (HOSPITAL_BASED_OUTPATIENT_CLINIC_OR_DEPARTMENT_OTHER): Payer: PPO | Admitting: Hematology

## 2021-11-25 VITALS — BP 139/62 | HR 81 | Temp 98.5°F | Resp 18 | Ht 62.0 in | Wt 116.4 lb

## 2021-11-25 DIAGNOSIS — D638 Anemia in other chronic diseases classified elsewhere: Secondary | ICD-10-CM

## 2021-11-25 DIAGNOSIS — C50411 Malignant neoplasm of upper-outer quadrant of right female breast: Secondary | ICD-10-CM | POA: Diagnosis not present

## 2021-11-25 DIAGNOSIS — Z9013 Acquired absence of bilateral breasts and nipples: Secondary | ICD-10-CM | POA: Insufficient documentation

## 2021-11-25 DIAGNOSIS — Z170 Estrogen receptor positive status [ER+]: Secondary | ICD-10-CM | POA: Diagnosis not present

## 2021-11-25 DIAGNOSIS — R221 Localized swelling, mass and lump, neck: Secondary | ICD-10-CM | POA: Diagnosis not present

## 2021-11-25 DIAGNOSIS — N183 Chronic kidney disease, stage 3 unspecified: Secondary | ICD-10-CM | POA: Diagnosis not present

## 2021-11-25 DIAGNOSIS — C50212 Malignant neoplasm of upper-inner quadrant of left female breast: Secondary | ICD-10-CM

## 2021-11-25 DIAGNOSIS — Z79899 Other long term (current) drug therapy: Secondary | ICD-10-CM | POA: Diagnosis not present

## 2021-11-25 DIAGNOSIS — Z171 Estrogen receptor negative status [ER-]: Secondary | ICD-10-CM | POA: Diagnosis not present

## 2021-11-25 DIAGNOSIS — D649 Anemia, unspecified: Secondary | ICD-10-CM | POA: Diagnosis not present

## 2021-11-25 DIAGNOSIS — E2839 Other primary ovarian failure: Secondary | ICD-10-CM | POA: Diagnosis not present

## 2021-11-25 DIAGNOSIS — Z79811 Long term (current) use of aromatase inhibitors: Secondary | ICD-10-CM | POA: Diagnosis not present

## 2021-11-25 DIAGNOSIS — M8580 Other specified disorders of bone density and structure, unspecified site: Secondary | ICD-10-CM | POA: Diagnosis not present

## 2021-11-25 LAB — CMP (CANCER CENTER ONLY)
ALT: 13 U/L (ref 0–44)
AST: 20 U/L (ref 15–41)
Albumin: 3.7 g/dL (ref 3.5–5.0)
Alkaline Phosphatase: 61 U/L (ref 38–126)
Anion gap: 4 — ABNORMAL LOW (ref 5–15)
BUN: 13 mg/dL (ref 8–23)
CO2: 26 mmol/L (ref 22–32)
Calcium: 9.9 mg/dL (ref 8.9–10.3)
Chloride: 108 mmol/L (ref 98–111)
Creatinine: 1.15 mg/dL — ABNORMAL HIGH (ref 0.44–1.00)
GFR, Estimated: 51 mL/min — ABNORMAL LOW (ref >60)
Glucose, Bld: 124 mg/dL — ABNORMAL HIGH (ref 70–99)
Potassium: 3.4 mmol/L — ABNORMAL LOW (ref 3.5–5.1)
Sodium: 138 mmol/L (ref 135–145)
Total Bilirubin: 0.2 mg/dL — ABNORMAL LOW (ref 0.3–1.2)
Total Protein: 6.2 g/dL — ABNORMAL LOW (ref 6.5–8.1)

## 2021-11-25 LAB — IRON AND IRON BINDING CAPACITY (CC-WL,HP ONLY)
Iron: 44 ug/dL (ref 28–170)
Saturation Ratios: 20 % (ref 10.4–31.8)
TIBC: 223 ug/dL — ABNORMAL LOW (ref 250–450)
UIBC: 179 ug/dL (ref 148–442)

## 2021-11-25 LAB — CBC WITH DIFFERENTIAL (CANCER CENTER ONLY)
Abs Immature Granulocytes: 0.03 10*3/uL (ref 0.00–0.07)
Basophils Absolute: 0 10*3/uL (ref 0.0–0.1)
Basophils Relative: 1 %
Eosinophils Absolute: 0.2 10*3/uL (ref 0.0–0.5)
Eosinophils Relative: 3 %
HCT: 30.8 % — ABNORMAL LOW (ref 36.0–46.0)
Hemoglobin: 10.4 g/dL — ABNORMAL LOW (ref 12.0–15.0)
Immature Granulocytes: 0 %
Lymphocytes Relative: 24 %
Lymphs Abs: 1.9 10*3/uL (ref 0.7–4.0)
MCH: 31.4 pg (ref 26.0–34.0)
MCHC: 33.8 g/dL (ref 30.0–36.0)
MCV: 93.1 fL (ref 80.0–100.0)
Monocytes Absolute: 0.5 10*3/uL (ref 0.1–1.0)
Monocytes Relative: 7 %
Neutro Abs: 5.1 10*3/uL (ref 1.7–7.7)
Neutrophils Relative %: 65 %
Platelet Count: 223 10*3/uL (ref 150–400)
RBC: 3.31 MIL/uL — ABNORMAL LOW (ref 3.87–5.11)
RDW: 12.9 % (ref 11.5–15.5)
WBC Count: 7.7 10*3/uL (ref 4.0–10.5)
nRBC: 0 % (ref 0.0–0.2)

## 2021-11-25 LAB — FERRITIN: Ferritin: 139 ng/mL (ref 11–307)

## 2021-11-25 MED ORDER — SODIUM CHLORIDE 0.9 % IV SOLN: Freq: Once | INTRAVENOUS | Status: AC

## 2021-11-25 MED ORDER — ZOLEDRONIC ACID 4 MG/100ML IV SOLN
4.0000 mg | Freq: Once | INTRAVENOUS | Status: AC
  Administered 2021-11-25: 4 mg via INTRAVENOUS
  Filled 2021-11-25: qty 100

## 2021-11-25 NOTE — Patient Instructions (Signed)

## 2021-11-25 NOTE — Progress Notes (Signed)
Connie Bennett   [phone] [phone]   Clinic Follow up Note   Patient Care Team: Connie Neer, MD as PCP - General (Family Medicine) Connie Bouche, NP (Inactive) as Nurse Practitioner (Nurse Practitioner) Connie Klein, MD as Consulting Physician (General Surgery) Connie Merle, MD as Consulting Physician (Hematology) Connie Cheese, NP as Nurse Practitioner (Hematology and Oncology) Connie Reese, MD as Consulting Physician (Plastic Surgery)  Date of Service:  11/25/2021  CHIEF COMPLAINT: f/u of bilateral breast cancer  CURRENT THERAPY:  Anastrozole 1 mg daily, started on 09/28/15 -Zometa, q39month, starting 10/2020  ASSESSMENT & PLAN:  Connie BUNDRICKis a 71y.o. post-menopausal female with   1. Breast cancer of upper inner quadrant of left breast,  Invasive ductal carcinoma, pT2N0M0, stage IIA, G2, ER+/PR-/HER2-, ONCOTYPE RS 28  -diagnosed in 05/2015. S/p left breast mastectomy.  -Oncotype RS showed moderate risk, chemo ultimately not pursued. -She started anti-estrogen therapy with exemestane in 07/2015, but stopped after 1 month use due to skin rash. She started anastrozole in 08/2015, plan for 7 years. She is tolerating well overall. -she is clinically doing very well, from a breast cancer standpoint. Labs reviewed, overall stable. Breast exam was unremarkable. There is no clinical concern for recurrence.   2. Extensive right breast DCIS, >10cm, high grade ER/PR negative -She was diagnosed in 07/2014. She is s/p right lumpectomy and multiple re-excision which resulted in her ultimately having a right breast mastectomy.   3. Osteoporosis  -Her 11/2017 DEXA shows Osteopenia with lowest T-score -2.2 at left femur neck. Repeat in February 2022 showed worsening with T score -2.9 at the left femur neck, she has osteoporosis now -she began Zometa q611monthon 11/07/20. -plan for repeat DEXA 05/2022; I ordered today   4. Left cervical  enlarged node  -present since at least 05/2021 -she feels it has gotten larger and notes it is tender. She denies dental concerns. She notes she continues smoking but is down to 3 cigarettes a day. -I recommend USKoreaf neck for further evaluation.   5. Anemia -Possibly anemia of chronic disease secondary to CKD -Her iron study, folic acid, and B1G89evels were normal in 11/2015. -hgb has been stable in 10-11 range. Iron panel today is pending (11/25/21).  -Continue oral iron daily.    6. Arthritis, chronic back pain, hypertension -She will continue follow-up with her primary care physician Dr ShBrigitte Pulsevery 6 weeks.  -Stable      Plan -proceed with IV Zometa infusion today -Continue anastrozole -neck USKoreao be done soon, will call her with results  -DEXA due 05/2022 -lab, f/u, and Zometa infusion in 6 months   No problem-specific Assessment & Plan notes found for this encounter.   SUMMARY OF ONCOLOGIC HISTORY: Oncology History Overview Note  Breast cancer of upper-outer quadrant of RIGHT female breast ([facility]  Staging form: Breast, AJCC 7th Edition Clinical stage from 09/06/2014: Stage 0 (Tis (DCIS), N0, M0)  Pathologic stage from 01/25/2015: Stage 0 (Tis (DCIS), N0, cM0)  ------  Breast cancer of upper-inner quadrant of LEFT female breast (Connie Bennett  Staging form: Breast, AJCC 7th Edition Clinical stage from 06/20/2015: Stage IA (T1c, N0, M0)      Pathologic stage from 07/05/2015: Stage IIA (T2, N0, cM0)    Breast cancer of upper-outer quadrant of right female breast ([facility] 08/23/2014 Mammogram   Mammogram and ultrasound showed a 3cm cystic lesion at 8:30 of right breast, and a 7 mm and a  5 mm increased density lesion at 10:00 of the right breast.   08/29/2014 Initial Biopsy   Right breast core needle bx x 2: high-grade DCIS, with focus of mildly suspicious for early stromal invasion. ER- (0%), PR- (0%).    08/29/2014 Clinical Stage   Stage 0: Tis N0   09/13/2014 Surgery   Right  lumpectomy/SLNB (Connie Bennett): High grade DCIS with necrosis and calcfications, positive margins. 5 LN removed and negative for malignancy (0/5). Grade 3.   09/13/2014 Pathologic Stage   Stage 0: Tis N0   09/29/2014 Surgery   Re-excision for positive margins; multiple breast margins re-excised, high-grade DCIS, measuring 1.0 cm, 3.0 cm, 2.0 cm, some margins are still positive or very close.    01/25/2015 Definitive Surgery   Right nipple-sparing mastectomy Connie Bennett): DCIS with calcifications, high grade, spans 9 cm, LCIS, fibrocystic changes with adenosis and calcifications, negative margins. 1 LN removed and negative. Connie Bennett   03/23/2015 Survivorship   Survivorship care plan completed and mailed to patient in lieu of in person visit at request of patient.   Breast cancer of upper-inner quadrant of left female breast (Connie Bennett)  06/28/2013 Imaging   DEXA scan: Osteopenia [facility])   06/19/2015 Mammogram   Diagnostic mammogram and ultrasound showed a 1.3 cm in the upper inner quadrant of left breast, and a 0.9 mm simple cyst. Axilla was negative.   06/20/2015 Receptors her2   ER 100% positive, PR negative, Ki-67 30%, HER-2 negative   06/20/2015 Initial Biopsy   The left breast upper inner quadrant mass biopsy showed invasive ductal carcinoma, grade 2   06/20/2015 Initial Diagnosis   Breast cancer of upper-inner quadrant of left female breast (Connie Bennett)   07/05/2015 Surgery   Left breast nipple-sparing mastectomy and sentinel lymph node biopsy with immediate Bennett Connie Bennett); Bennett with Dr. Harlow Bennett at [facility]   07/05/2015 Pathology Results   Left mastectomy: Invasive ductal carcinoma with extracellular mucin, grade 2, spanning 2.0 cm, margins were negative, lobular carcinoma in situ, 3 sentinel lymph nodes negative. PR repeated and remains negative. HER2 remains neg (ratio 1.21)   07/05/2015 Pathologic Stage   pT2, pN0: Stage IIA    07/05/2015 Oncotype testing   RS 28  (intermediate risk), which predicts 10 year risk of distant recurrence 18% with tamoxifen alone   07/30/2015 - 08/30/2015 Anti-estrogen oral therapy   Adjuvant exemestane 25 mg once daily, stopped after one month due to skin rash.    09/01/2015 -  Anti-estrogen oral therapy   Switched to anastrozole 3m daily.  Planned duration of treatment : 5-10 years ([facility]      INTERVAL HISTORY:  Connie Bennett here for a follow up of breast cancer. She was last seen by me on 05/22/21. She presents to the clinic alone. She reports she fell and has been having neck pain since then. She also reports continued enlarged left cervical node, and she feels it's gotten bigger. She notes it's tender to palpation.   All other systems were reviewed with the patient and are negative.  MEDICAL HISTORY:  Past Medical History:  Diagnosis Date   Anxiety    takes Xanax daily as needed   Arthritis    Bilateral lower extremity edema    takes Furosemide daily as needed   Breast cancer ([facility] oncologist-  dr YTruitt Bennett-  right upper-outer quadrant    dx May 2016--- Stage 0  (Tis,N0,M0)  DCIS  Right breast---  09-13-2014  s/p  radioactive seed/ partial mastectomy / sln  bx   Chronic fatigue syndrome    Chronic low back pain    Complication of anesthesia    spinal cord stimulator- to be off for surgery   Depression    takes Effexor daily   Family history of adverse reaction to anesthesia    mom hard to wake up   Fibromyalgia    Headache(784.0)    couple of times a week   Heart murmur    History of bronchitis > 69yr ago   History of colon polyps    benign   History of drug overdose    oxycontin  and oxycodone  Sept 2015--  now has narcan injection prescription   History of DVT of lower extremity    2008--  BILATERAL   History of hiatal hernia    HTN (hypertension)    takes Amlodipine and Ramipril daily   Hyperlipidemia    takes Zetia and Crestor  daily   Joint pain    Mitral valve prolapse    MILD /    PER PT ASYMPTOMATIC   Muscle spasm    takes Zanaflex daily as needed   Osteoporosis    Peripheral neuropathy    takes Neurontin daily   Peripheral vascular disease (HCC)    hx dvt's   RSD (reflex sympathetic dystrophy)    left wrist and forearm from a fx   RSD (reflex sympathetic dystrophy)    Sjogren's disease ([facility]    Vertigo    takes Meclizine daily     SURGICAL HISTORY: Past Surgical History:  Procedure Laterality Date   BACK SURGERY     BREAST Bennett WITH PLACEMENT OF TISSUE EXPANDER AND FLEX HD (ACELLULAR HYDRATED DERMIS) Right 01/25/2015   Procedure: RIGHT BREAST Bennett WITH PLACEMENT OF IMPLANT AND ACELLULAR DERMAL MATRIX ;  Surgeon: DCrissie Reese MD;  [place]  Service: Plastics;  Laterality: Right;   BREAST Bennett WITH PLACEMENT OF TISSUE EXPANDER AND FLEX HD (ACELLULAR HYDRATED DERMIS) Left 07/05/2015   Procedure: LEFT BREAST Bennett WITH PLACEMENT OF TISSUE EXPANDER AND  ACELLULAR DERMAL MATRIX ;  Surgeon: DCrissie Reese MD;  [place]  Service: Plastics;  Laterality: Left;   COLONOSCOPY     ESOPHAGOGASTRODUODENOSCOPY     eye lid surgery Bilateral    FOOT SURGERY Bilateral 1996   MORTON'S NEUROMA   INNER EAR SURGERY Right    LUMBAR [facility]     MASTECTOMY Right 01/25/2015   nipple sparing    NASAL SINUS SURGERY     NIPPLE SPARING MASTECTOMY/SENTINAL LYMPH NODE BIOPSY/Bennett/PLACEMENT OF TISSUE EXPANDER Left 07/05/2015   Procedure: LEFT NIPPLE SPARING MASTECTOMY WITH SENTINAL LYMPH NODE BIOPSY ;  Surgeon: FStark Klein MD;  [place]  Service: General;  Laterality: Left;   OPEN REDUCTION INTERNAL FIXATION (ORIF) DISTAL RADIAL FRACTURE Right 08/09/2018   Procedure: OPEN REDUCTION INTERNAL FIXATION (ORIF) DISTAL RADIAL FRACTURE;  Surgeon: TMilly Jakob MD;  [place]  Service: Orthopedics;  Laterality: Right;   ORIF LEFT WRIST FX'S/  LEFT CARPAL TUNNEL RELEASE  1999   POSTERIOR  LUMBAR FUSION  01-30-2009   L4--5 Laminectmy w/ decompression/  bilateral L4--5 microdiskectomy and fusion   RADIOACTIVE SEED GUIDED PARTIAL MASTECTOMY WITH AXILLARY SENTINEL LYMPH NODE BIOPSY Right 09/13/2014   Procedure: RADIOACTIVE SEED GUIDED PARTIAL MASTECTOMY WITH AXILLARY SENTINEL LYMPH NODE BIOPSY;  Surgeon: FStark Klein MD;  [place]  Service: General;  Laterality: Right;   RE-EXCISION OF BREAST LUMPECTOMY Right 09/29/2014  Procedure: RE-EXCISION OF BREAST LUMPECTOMY;  Surgeon: Connie Klein, MD;  Location: [place];  Service: General;  Laterality: Right;   RIGHT CARPAL TUNNEL RELEASE/  TRIGGER RELEASE RIGHT MIDDLE FINGER  06-18-2006   RIGHT INFERIOR PARATHYROIDECTOMY  04-07-2006   SIMPLE MASTECTOMY WITH AXILLARY SENTINEL NODE BIOPSY Right 01/25/2015   Procedure: RIGHT NIPPLE SPARING MASTECTOMY;  Surgeon: Connie Klein, MD;  Location: [place];  Service: General;  Laterality: Right;   SPINAL CORD STIMULATOR IMPLANT  2013   TONSILLECTOMY  as child    I have reviewed the social history and family history with the patient and they are unchanged from previous note.  ALLERGIES:  is allergic to carbocaine [mepivacaine hcl], penicillins, sulfa antibiotics, and latex.  MEDICATIONS:  Current Outpatient Medications  Medication Sig Dispense Refill   acetaminophen (TYLENOL) 500 MG tablet Take 1,000 mg by mouth every 6 (six) hours as needed (pain).     ALPRAZolam (XANAX) 1 MG tablet Take 1 mg by mouth 3 (three) times daily as needed for anxiety or sleep (nerves).     anastrozole (ARIMIDEX) 1 MG tablet TAKE 1 TABLET BY MOUTH EVERY DAY (Patient taking differently: Take 1 mg by mouth at bedtime.) 90 tablet 3   Ascorbic Acid (VITAMIN C) 1000 MG tablet Take 1,000 mg by mouth daily with lunch.     B Complex-C (B-COMPLEX WITH VITAMIN C) tablet Take 1 tablet by mouth daily. 120 tablet 0   brexpiprazole (REXULTI) 1 MG TABS tablet Take 1 mg by mouth every morning.      calcium carbonate (OS-CAL) 600 MG TABS tablet Take 600 mg by mouth daily with lunch.     cephALEXin (KEFLEX) 500 MG capsule Take 500 mg by mouth 3 (three) times daily.     Cholecalciferol (VITAMIN D) 50 MCG (2000 UT) tablet Take 2,000 Units by mouth daily with lunch.     clotrimazole (CLOTRIMAZOLE ANTI-FUNGAL) 1 % cream Apply 1 application. topically 2 (two) times daily. 30 g 0   ezetimibe (ZETIA) 10 MG tablet Take 10 mg by mouth at bedtime.      ferrous sulfate 325 (65 FE) MG tablet Take 1 tablet (325 mg total) by mouth daily with breakfast. 60 tablet 0   Flaxseed, Linseed, (FLAX SEED OIL) 1000 MG CAPS Take 1,000 mg by mouth daily with lunch.     furosemide (LASIX) 20 MG tablet Take 1 tablet (20 mg total) by mouth daily. As needed for lower extremity swelling 5 tablet 0   furosemide (LASIX) 40 MG tablet Take 40 mg by mouth daily as needed for fluid or edema (leg swelling). Reported on 06/29/2015  1   gabapentin (NEURONTIN) 600 MG tablet Take 1 tablet (600 mg total) by mouth at bedtime for 14 days. Take one tablet (600 mg) by mouth every morning and two tablets (1200 mg) at night 14 tablet 0   Lactobacillus (PROBIOTIC ACIDOPHILUS PO) Take 1 tablet by mouth daily with lunch.     lidocaine (LIDODERM) 5 % Place 1 patch onto the skin at bedtime as needed (pain).     meclizine (ANTIVERT) 25 MG tablet Take 25 mg by mouth 4 (four) times daily as needed for dizziness.     naloxegol oxalate (MOVANTIK) 25 MG TABS tablet Take by mouth.     Omega-3 Fatty Acids (FISH OIL) 1000 MG CAPS Take 1,000 mg by mouth daily with lunch.     oxyCODONE-acetaminophen (PERCOCET) 10-325 MG tablet Take 1 tablet by mouth 5 (five) times daily as needed  for pain.     polyethylene glycol (MIRALAX / GLYCOLAX) 17 g packet Take 17 g by mouth daily as needed for mild constipation. 14 each 0   PROAIR HFA 108 (90 Base) MCG/ACT inhaler Inhale 2 puffs into the lungs every 4 (four) hours as needed for wheezing or shortness of breath.  2    ramipril (ALTACE) 10 MG capsule Take 10 mg by mouth every morning.     rosuvastatin (CRESTOR) 20 MG tablet Take 20 mg by mouth at bedtime.     venlafaxine (EFFEXOR-XR) 150 MG 24 hr capsule Take 150 mg by mouth 2 (two) times daily with breakfast and lunch.     No current facility-administered medications for this visit.    PHYSICAL EXAMINATION: ECOG PERFORMANCE STATUS: 0 - Asymptomatic  Vitals:   11/25/21 1424  BP: 139/62  Bennett: 81  Resp: 18  Temp: 98.5 F (36.9 C)  SpO2: 97%   Wt Readings from Last 3 Encounters:  11/25/21 116 lb 6.4 oz (52.8 kg)  08/29/21 115 lb (52.2 kg)  07/19/21 121 lb 0.5 oz (54.9 kg)     GENERAL:alert, no distress and comfortable SKIN: skin color, texture, turgor are normal, no rashes or significant lesions EYES: normal, Conjunctiva are pink and non-injected, sclera clear OROPHARYNX:no exudate, no erythema and lips, buccal mucosa, and tongue normal  NECK: supple, thyroid normal size, non-tender, without nodularity LYMPH:  no palpable lymphadenopathy in the axillary, (+) palpable left cervical lymph node LUNGS: clear to auscultation and percussion with normal breathing effort HEART: regular rate & rhythm and no murmurs and no lower extremity edema ABDOMEN:abdomen soft, non-tender and normal bowel sounds Musculoskeletal:no cyanosis of digits and no clubbing  NEURO: alert & oriented x 3 with fluent speech, no focal motor/sensory deficits BREAST: No palpable mass, nodules or adenopathy bilaterally. Breast exam benign.   LABORATORY DATA:  I have reviewed the data as listed    Latest Ref Rng & Units 11/25/2021    2:07 PM 08/15/2021    2:38 AM 07/24/2021    4:42 AM  CBC  WBC 4.0 - 10.5 K/uL 7.7  9.2  8.6   Hemoglobin 12.0 - 15.0 g/dL 10.4  9.4  9.6   Hematocrit 36.0 - 46.0 % 30.8  28.8  30.0   Platelets 150 - 400 K/uL 223  264  398         Latest Ref Rng & Units 11/25/2021    2:07 PM 08/15/2021    2:38 AM 07/27/2021    3:44 AM  CMP  Glucose 70 - 99  mg/dL 124  88  95   BUN 8 - 23 mg/dL 13  22  26    Creatinine 0.44 - 1.00 mg/dL 1.15  1.87  1.58   Sodium 135 - 145 mmol/L 138  139  139   Potassium 3.5 - 5.1 mmol/L 3.4  4.7  3.9   Chloride 98 - 111 mmol/L 108  106  104   CO2 22 - 32 mmol/L 26  25  26    Calcium 8.9 - 10.3 mg/dL 9.9  9.6  9.2   Total Protein 6.5 - 8.1 g/dL 6.2     Total Bilirubin 0.3 - 1.2 mg/dL 0.2     Alkaline Phos 38 - 126 U/L 61     AST 15 - 41 U/L 20     ALT 0 - 44 U/L 13         RADIOGRAPHIC STUDIES: I have personally reviewed the radiological images as listed  and agreed with the findings in the report. No results found.    Orders Placed This Encounter  Procedures   US Soft Tissue Head/Neck    Standing Status:   Future    Standing Expiration Date:   11/25/2022    Order Specific Question:   Reason for Exam (SYMPTOM  OR DIAGNOSIS REQUIRED)    Answer:   adenopathy in left upper neck    Order Specific Question:   Preferred imaging location?    Answer:   [facility]   DG Bone Density    Standing Status:   Future    Standing Expiration Date:   11/25/2022    Scheduling Instructions:     [place]    Order Specific Question:   Reason for Exam (SYMPTOM  OR DIAGNOSIS REQUIRED)    Answer:   screening    Order Specific Question:   Preferred imaging location?    Answer:   [facility]   All questions were answered. The patient knows to call the clinic with any problems, questions or concerns. No barriers to learning was detected. The total time spent in the appointment was 30 minutes.     Connie Merle, MD 11/25/2021   I, Wilburn Mylar, am acting as scribe for Connie Merle, MD.   I have reviewed the above documentation for accuracy and completeness, and I agree with the above.

## 2021-11-26 ENCOUNTER — Encounter: Payer: Self-pay | Admitting: Hematology

## 2021-11-26 DIAGNOSIS — S134XXA Sprain of ligaments of cervical spine, initial encounter: Secondary | ICD-10-CM | POA: Diagnosis not present

## 2021-11-26 DIAGNOSIS — G4489 Other headache syndrome: Secondary | ICD-10-CM | POA: Diagnosis not present

## 2021-11-26 DIAGNOSIS — M5032 Other cervical disc degeneration, mid-cervical region, unspecified level: Secondary | ICD-10-CM | POA: Diagnosis not present

## 2021-11-26 DIAGNOSIS — S233XXA Sprain of ligaments of thoracic spine, initial encounter: Secondary | ICD-10-CM | POA: Diagnosis not present

## 2021-11-26 DIAGNOSIS — M5413 Radiculopathy, cervicothoracic region: Secondary | ICD-10-CM | POA: Diagnosis not present

## 2021-11-26 DIAGNOSIS — M9902 Segmental and somatic dysfunction of thoracic region: Secondary | ICD-10-CM | POA: Diagnosis not present

## 2021-11-26 DIAGNOSIS — M9901 Segmental and somatic dysfunction of cervical region: Secondary | ICD-10-CM | POA: Diagnosis not present

## 2021-11-26 DIAGNOSIS — M531 Cervicobrachial syndrome: Secondary | ICD-10-CM | POA: Diagnosis not present

## 2021-11-26 DIAGNOSIS — M542 Cervicalgia: Secondary | ICD-10-CM | POA: Diagnosis not present

## 2021-12-02 ENCOUNTER — Emergency Department
Admission: EM | Admit: 2021-12-02 | Discharge: 2021-12-02 | Disposition: A | Discharge status: Home / Self Care | Payer: PPO | Attending: Emergency Medicine | Admitting: Emergency Medicine

## 2021-12-02 ENCOUNTER — Other Ambulatory Visit: Payer: Self-pay

## 2021-12-02 DIAGNOSIS — I129 Hypertensive chronic kidney disease with stage 1 through stage 4 chronic kidney disease, or unspecified chronic kidney disease: Secondary | ICD-10-CM | POA: Diagnosis not present

## 2021-12-02 DIAGNOSIS — R197 Diarrhea, unspecified: Secondary | ICD-10-CM

## 2021-12-02 DIAGNOSIS — E876 Hypokalemia: Secondary | ICD-10-CM | POA: Diagnosis not present

## 2021-12-02 DIAGNOSIS — N179 Acute kidney failure, unspecified: Secondary | ICD-10-CM | POA: Diagnosis not present

## 2021-12-02 DIAGNOSIS — N189 Chronic kidney disease, unspecified: Secondary | ICD-10-CM | POA: Insufficient documentation

## 2021-12-02 DIAGNOSIS — Z853 Personal history of malignant neoplasm of breast: Secondary | ICD-10-CM | POA: Diagnosis not present

## 2021-12-02 LAB — COMPREHENSIVE METABOLIC PANEL
ALT: 21 U/L (ref 0–44)
AST: 36 U/L (ref 15–41)
Albumin: 3.8 g/dL (ref 3.5–5.0)
Alkaline Phosphatase: 62 U/L (ref 38–126)
Anion gap: 10 (ref 5–15)
BUN: 18 mg/dL (ref 8–23)
CO2: 22 mmol/L (ref 22–32)
Calcium: 9.1 mg/dL (ref 8.9–10.3)
Chloride: 111 mmol/L (ref 98–111)
Creatinine, Ser: 2.97 mg/dL — ABNORMAL HIGH (ref 0.44–1.00)
GFR, Estimated: 16 mL/min — ABNORMAL LOW (ref >60)
Glucose, Bld: 102 mg/dL — ABNORMAL HIGH (ref 70–99)
Potassium: 3.1 mmol/L — ABNORMAL LOW (ref 3.5–5.1)
Sodium: 143 mmol/L (ref 135–145)
Total Bilirubin: 0.2 mg/dL — ABNORMAL LOW (ref 0.3–1.2)
Total Protein: 7.1 g/dL (ref 6.5–8.1)

## 2021-12-02 LAB — CBC WITH DIFFERENTIAL/PLATELET
Abs Immature Granulocytes: 0.02 10*3/uL (ref 0.00–0.07)
Basophils Absolute: 0.1 10*3/uL (ref 0.0–0.1)
Basophils Relative: 1 %
Eosinophils Absolute: 0.1 10*3/uL (ref 0.0–0.5)
Eosinophils Relative: 1 %
HCT: 34 % — ABNORMAL LOW (ref 36.0–46.0)
Hemoglobin: 10.8 g/dL — ABNORMAL LOW (ref 12.0–15.0)
Immature Granulocytes: 0 %
Lymphocytes Relative: 23 %
Lymphs Abs: 1.7 10*3/uL (ref 0.7–4.0)
MCH: 29.8 pg (ref 26.0–34.0)
MCHC: 31.8 g/dL (ref 30.0–36.0)
MCV: 93.9 fL (ref 80.0–100.0)
Monocytes Absolute: 0.6 10*3/uL (ref 0.1–1.0)
Monocytes Relative: 8 %
Neutro Abs: 4.9 10*3/uL (ref 1.7–7.7)
Neutrophils Relative %: 67 %
Platelets: 247 10*3/uL (ref 150–400)
RBC: 3.62 MIL/uL — ABNORMAL LOW (ref 3.87–5.11)
RDW: 13.8 % (ref 11.5–15.5)
WBC: 7.4 10*3/uL (ref 4.0–10.5)
nRBC: 0 % (ref 0.0–0.2)

## 2021-12-02 LAB — URINALYSIS, ROUTINE W REFLEX MICROSCOPIC
Bilirubin Urine: NEGATIVE
Glucose, UA: NEGATIVE mg/dL
Ketones, ur: NEGATIVE mg/dL
Leukocytes,Ua: NEGATIVE
Nitrite: NEGATIVE
Protein, ur: 30 mg/dL — AB
Specific Gravity, Urine: 1.003 — ABNORMAL LOW (ref 1.005–1.030)
pH: 7 (ref 5.0–8.0)

## 2021-12-02 MED ORDER — POTASSIUM CHLORIDE CRYS ER 20 MEQ PO TBCR
40.0000 meq | EXTENDED_RELEASE_TABLET | Freq: Once | ORAL | Status: AC
Start: 2021-12-02 — End: 2021-12-02
  Administered 2021-12-02: 40 meq via ORAL
  Filled 2021-12-02: qty 2

## 2021-12-02 MED ORDER — LACTATED RINGERS IV BOLUS
1000.0000 mL | Freq: Once | INTRAVENOUS | Status: AC
  Administered 2021-12-02: 1000 mL via INTRAVENOUS

## 2021-12-02 MED ORDER — POTASSIUM CHLORIDE 10 MEQ/100ML IV SOLN
10.0000 meq | INTRAVENOUS | Status: DC
  Administered 2021-12-02: 10 meq via INTRAVENOUS
  Filled 2021-12-02: qty 100

## 2021-12-02 NOTE — ED Triage Notes (Signed)
Pt presents to ER with c/o diarrhea x1 week that pt states had become slightly better and then became worse again.  Pt states she is currently taking dificid for a second time (states she took one course in June of this year).  Pt denies any other abx recently.  Pt denies n/v, and some cramping in her abdomen.  Pt states stool is very runny, but states it does not appear dark/tarry in appearance.  Pt otherwise A&O x4, in NAD in triage.

## 2021-12-02 NOTE — ED Notes (Signed)
Report to Matthew RN

## 2021-12-02 NOTE — ED Provider Notes (Signed)
[facility] Provider Note    Event Date/Time   First MD Initiated Contact with Patient 12/02/21 [phone]     (approximate)   History   Diarrhea   HPI  IA LEEB is a 71 y.o. female who presents to the ED for evaluation of Diarrhea   I reviewed DC summary from April admission due to cellulitis of her left leg.  She is a history of Sjogren's disease, breast cancer s/p mastectomy, CKD 3, HTN, HLD.  Patient presents to the ED for evaluation of about 1 week of watery diarrhea.  She reports for the past 6 or 7 days she has had 10+ episodes of watery stool per day.  Reports some nausea and stomach rumbling, but no abdominal pain.  Reports she called her PCP last week and was prescribed Dificid over the phone without any stool samples provided, she started this on Thursday.  Prior to this Dificid, she has had no antibiotics since her medical admissions in April for cellulitis.  She denies any fevers, reports dizziness with standing.   Physical Exam   Triage Vital Signs: ED Triage Vitals  Enc Vitals Group     BP 12/02/21 0533 (!) 159/74     Pulse Rate 12/02/21 0533 70     Resp 12/02/21 0533 20     Temp 12/02/21 0533 98.3 F (36.8 C)     Temp Source 12/02/21 0533 Oral     SpO2 12/02/21 0533 96 %     Weight --      Height --      Head Circumference --      Peak Flow --      Pain Score 12/02/21 0548 5     Pain Loc --      Pain Edu? --      Excl. in [place]? --     Most recent vital signs: Vitals:   12/02/21 0618 12/02/21 0620  BP:    Pulse: 70   Resp: 16   Temp:  97.8 F (36.6 C)  SpO2: 100%     General: Awake, no distress.  Dry mucous membranes.  Looks systemically well. CV:  Good peripheral perfusion.  Resp:  Normal effort.  Abd:  No distention.  Soft and benign throughout. MSK:  No deformity noted.  Neuro:  No focal deficits appreciated. Other:     ED Results / Procedures / Treatments   Labs (all labs ordered are listed, but  only abnormal results are displayed) Labs Reviewed  CBC WITH DIFFERENTIAL/PLATELET - Abnormal; Notable for the following components:      Result Value   RBC 3.62 (*)    Hemoglobin 10.8 (*)    HCT 34.0 (*)    All other components within normal limits  COMPREHENSIVE METABOLIC PANEL - Abnormal; Notable for the following components:   Potassium 3.1 (*)    Glucose, Bld 102 (*)    Creatinine, Ser 2.97 (*)    Total Bilirubin 0.2 (*)    GFR, Estimated 16 (*)    All other components within normal limits  C DIFFICILE QUICK SCREEN W PCR REFLEX    URINALYSIS, ROUTINE W REFLEX MICROSCOPIC    EKG   RADIOLOGY   Official radiology report(s): No results found.  PROCEDURES and INTERVENTIONS:  Procedures  Medications  lactated ringers bolus 1,000 mL (1,000 mLs Intravenous New Bag/Given 12/02/21 3329)     IMPRESSION / MDM / ASSESSMENT AND PLAN / ED COURSE  I reviewed the triage vital  signs and the nursing notes.  Differential diagnosis includes, but is not limited to, C. difficile, gastroenteritis, dehydration, colitis, SBO  {Patient presents with symptoms of an acute illness or injury that is potentially life-threatening.  71 year old woman presents to the ED with a week of watery diarrhea, with evidence of an AKI and hypokalemia requiring medical admission.  Has a reassuring abdominal examination without peritoneal features.  Blood work with no leukocytosis or signs of sepsis.  Metabolic panel with hypokalemia and AKI on CKD.  She is started on lactated Ringer's.  Awaiting urine and stool samples to assess for cystitis and C. difficile.  We will consult medicine for admission.      FINAL CLINICAL IMPRESSION(S) / ED DIAGNOSES   Final diagnoses:  AKI (acute kidney injury) ([place])  Diarrhea, unspecified type  Hypokalemia     Rx / DC Orders   ED Discharge Orders     None        Note:  This document was prepared using Dragon voice recognition software and may include  unintentional dictation errors.   Vladimir Crofts, MD 12/02/21 [phone]

## 2021-12-02 NOTE — ED Provider Notes (Signed)
-----------------------------------------   8:41 AM on 12/02/2021 ----------------------------------------- Patient evaluated by hospitalist and stated to them that she did not want to be admitted.  I had further discussion with the patient and explained that she was significantly dehydrated with associated AKI.  Despite this, and explanation of possible worsening kidney function if she were to go home, patient continues to express desire to leave [place].  She was counseled to closely follow-up with her PCP and to drink plenty of fluids, counseled to return to the ED for reassessment at any time.  Patient expresses understanding.   Blake Divine, MD 12/02/21 [phone]

## 2021-12-03 ENCOUNTER — Encounter (HOSPITAL_COMMUNITY): Payer: Self-pay

## 2021-12-03 ENCOUNTER — Inpatient Hospital Stay (HOSPITAL_COMMUNITY)
Admission: EM | Admit: 2021-12-03 | Discharge: 2021-12-07 | DRG: 683 | Disposition: A | Discharge status: Home / Self Care | Payer: PPO | Attending: Internal Medicine | Admitting: Internal Medicine

## 2021-12-03 ENCOUNTER — Other Ambulatory Visit: Payer: Self-pay

## 2021-12-03 ENCOUNTER — Emergency Department (HOSPITAL_COMMUNITY): Payer: PPO

## 2021-12-03 DIAGNOSIS — Z79899 Other long term (current) drug therapy: Secondary | ICD-10-CM

## 2021-12-03 DIAGNOSIS — G629 Polyneuropathy, unspecified: Secondary | ICD-10-CM | POA: Diagnosis not present

## 2021-12-03 DIAGNOSIS — E876 Hypokalemia: Secondary | ICD-10-CM | POA: Diagnosis not present

## 2021-12-03 DIAGNOSIS — I739 Peripheral vascular disease, unspecified: Secondary | ICD-10-CM | POA: Diagnosis not present

## 2021-12-03 DIAGNOSIS — Z981 Arthrodesis status: Secondary | ICD-10-CM

## 2021-12-03 DIAGNOSIS — M545 Low back pain, unspecified: Secondary | ICD-10-CM | POA: Diagnosis not present

## 2021-12-03 DIAGNOSIS — Z803 Family history of malignant neoplasm of breast: Secondary | ICD-10-CM

## 2021-12-03 DIAGNOSIS — I341 Nonrheumatic mitral (valve) prolapse: Secondary | ICD-10-CM | POA: Diagnosis not present

## 2021-12-03 DIAGNOSIS — N281 Cyst of kidney, acquired: Secondary | ICD-10-CM | POA: Diagnosis not present

## 2021-12-03 DIAGNOSIS — N2889 Other specified disorders of kidney and ureter: Secondary | ICD-10-CM | POA: Diagnosis not present

## 2021-12-03 DIAGNOSIS — Z170 Estrogen receptor positive status [ER+]: Secondary | ICD-10-CM | POA: Diagnosis not present

## 2021-12-03 DIAGNOSIS — G90512 Complex regional pain syndrome I of left upper limb: Secondary | ICD-10-CM | POA: Diagnosis not present

## 2021-12-03 DIAGNOSIS — M797 Fibromyalgia: Secondary | ICD-10-CM | POA: Diagnosis present

## 2021-12-03 DIAGNOSIS — Z882 Allergy status to sulfonamides status: Secondary | ICD-10-CM

## 2021-12-03 DIAGNOSIS — I700 Atherosclerosis of aorta: Secondary | ICD-10-CM | POA: Diagnosis present

## 2021-12-03 DIAGNOSIS — C50212 Malignant neoplasm of upper-inner quadrant of left female breast: Secondary | ICD-10-CM | POA: Diagnosis not present

## 2021-12-03 DIAGNOSIS — D638 Anemia in other chronic diseases classified elsewhere: Secondary | ICD-10-CM | POA: Diagnosis present

## 2021-12-03 DIAGNOSIS — G9332 Myalgic encephalomyelitis/chronic fatigue syndrome: Secondary | ICD-10-CM | POA: Diagnosis present

## 2021-12-03 DIAGNOSIS — M1990 Unspecified osteoarthritis, unspecified site: Secondary | ICD-10-CM | POA: Diagnosis not present

## 2021-12-03 DIAGNOSIS — E860 Dehydration: Secondary | ICD-10-CM | POA: Diagnosis not present

## 2021-12-03 DIAGNOSIS — R8271 Bacteriuria: Secondary | ICD-10-CM | POA: Diagnosis present

## 2021-12-03 DIAGNOSIS — Z9011 Acquired absence of right breast and nipple: Secondary | ICD-10-CM

## 2021-12-03 DIAGNOSIS — R197 Diarrhea, unspecified: Secondary | ICD-10-CM

## 2021-12-03 DIAGNOSIS — N1831 Chronic kidney disease, stage 3a: Secondary | ICD-10-CM | POA: Diagnosis present

## 2021-12-03 DIAGNOSIS — Z79811 Long term (current) use of aromatase inhibitors: Secondary | ICD-10-CM

## 2021-12-03 DIAGNOSIS — I7143 Infrarenal abdominal aortic aneurysm, without rupture: Secondary | ICD-10-CM | POA: Diagnosis not present

## 2021-12-03 DIAGNOSIS — Z880 Allergy status to penicillin: Secondary | ICD-10-CM

## 2021-12-03 DIAGNOSIS — F17210 Nicotine dependence, cigarettes, uncomplicated: Secondary | ICD-10-CM | POA: Diagnosis present

## 2021-12-03 DIAGNOSIS — Z86718 Personal history of other venous thrombosis and embolism: Secondary | ICD-10-CM

## 2021-12-03 DIAGNOSIS — I2510 Atherosclerotic heart disease of native coronary artery without angina pectoris: Secondary | ICD-10-CM | POA: Diagnosis present

## 2021-12-03 DIAGNOSIS — I129 Hypertensive chronic kidney disease with stage 1 through stage 4 chronic kidney disease, or unspecified chronic kidney disease: Secondary | ICD-10-CM | POA: Diagnosis present

## 2021-12-03 DIAGNOSIS — M810 Age-related osteoporosis without current pathological fracture: Secondary | ICD-10-CM | POA: Diagnosis present

## 2021-12-03 DIAGNOSIS — Z91040 Latex allergy status: Secondary | ICD-10-CM

## 2021-12-03 DIAGNOSIS — A0471 Enterocolitis due to Clostridium difficile, recurrent: Secondary | ICD-10-CM | POA: Diagnosis present

## 2021-12-03 DIAGNOSIS — N3000 Acute cystitis without hematuria: Secondary | ICD-10-CM

## 2021-12-03 DIAGNOSIS — Z8249 Family history of ischemic heart disease and other diseases of the circulatory system: Secondary | ICD-10-CM

## 2021-12-03 DIAGNOSIS — I10 Essential (primary) hypertension: Secondary | ICD-10-CM | POA: Diagnosis present

## 2021-12-03 DIAGNOSIS — I714 Abdominal aortic aneurysm, without rupture, unspecified: Secondary | ICD-10-CM | POA: Diagnosis not present

## 2021-12-03 DIAGNOSIS — F329 Major depressive disorder, single episode, unspecified: Secondary | ICD-10-CM | POA: Diagnosis present

## 2021-12-03 DIAGNOSIS — Z720 Tobacco use: Secondary | ICD-10-CM | POA: Diagnosis present

## 2021-12-03 DIAGNOSIS — E870 Hyperosmolality and hypernatremia: Secondary | ICD-10-CM | POA: Diagnosis not present

## 2021-12-03 DIAGNOSIS — M3500 Sicca syndrome, unspecified: Secondary | ICD-10-CM | POA: Diagnosis not present

## 2021-12-03 DIAGNOSIS — E782 Mixed hyperlipidemia: Secondary | ICD-10-CM | POA: Diagnosis present

## 2021-12-03 DIAGNOSIS — Z853 Personal history of malignant neoplasm of breast: Secondary | ICD-10-CM

## 2021-12-03 DIAGNOSIS — N179 Acute kidney failure, unspecified: Secondary | ICD-10-CM | POA: Diagnosis not present

## 2021-12-03 DIAGNOSIS — F419 Anxiety disorder, unspecified: Secondary | ICD-10-CM | POA: Diagnosis present

## 2021-12-03 DIAGNOSIS — Z884 Allergy status to anesthetic agent status: Secondary | ICD-10-CM

## 2021-12-03 DIAGNOSIS — D631 Anemia in chronic kidney disease: Secondary | ICD-10-CM | POA: Diagnosis present

## 2021-12-03 LAB — CBC WITH DIFFERENTIAL/PLATELET
Abs Immature Granulocytes: 0.02 10*3/uL (ref 0.00–0.07)
Basophils Absolute: 0 10*3/uL (ref 0.0–0.1)
Basophils Relative: 0 %
Eosinophils Absolute: 0.1 10*3/uL (ref 0.0–0.5)
Eosinophils Relative: 1 %
HCT: 34.5 % — ABNORMAL LOW (ref 36.0–46.0)
Hemoglobin: 11.2 g/dL — ABNORMAL LOW (ref 12.0–15.0)
Immature Granulocytes: 0 %
Lymphocytes Relative: 24 %
Lymphs Abs: 1.6 10*3/uL (ref 0.7–4.0)
MCH: 30.9 pg (ref 26.0–34.0)
MCHC: 32.5 g/dL (ref 30.0–36.0)
MCV: 95 fL (ref 80.0–100.0)
Monocytes Absolute: 0.5 10*3/uL (ref 0.1–1.0)
Monocytes Relative: 8 %
Neutro Abs: 4.6 10*3/uL (ref 1.7–7.7)
Neutrophils Relative %: 67 %
Platelets: 251 10*3/uL (ref 150–400)
RBC: 3.63 MIL/uL — ABNORMAL LOW (ref 3.87–5.11)
RDW: 14.1 % (ref 11.5–15.5)
WBC: 6.9 10*3/uL (ref 4.0–10.5)
nRBC: 0 % (ref 0.0–0.2)

## 2021-12-03 LAB — COMPREHENSIVE METABOLIC PANEL
ALT: 21 U/L (ref 0–44)
AST: 32 U/L (ref 15–41)
Albumin: 3.6 g/dL (ref 3.5–5.0)
Alkaline Phosphatase: 64 U/L (ref 38–126)
Anion gap: 8 (ref 5–15)
BUN: 18 mg/dL (ref 8–23)
CO2: 22 mmol/L (ref 22–32)
Calcium: 8.9 mg/dL (ref 8.9–10.3)
Chloride: 115 mmol/L — ABNORMAL HIGH (ref 98–111)
Creatinine, Ser: 2.91 mg/dL — ABNORMAL HIGH (ref 0.44–1.00)
GFR, Estimated: 17 mL/min — ABNORMAL LOW (ref >60)
Glucose, Bld: 75 mg/dL (ref 70–99)
Potassium: 2.8 mmol/L — ABNORMAL LOW (ref 3.5–5.1)
Sodium: 145 mmol/L (ref 135–145)
Total Bilirubin: 0.6 mg/dL (ref 0.3–1.2)
Total Protein: 7.3 g/dL (ref 6.5–8.1)

## 2021-12-03 LAB — URINALYSIS, ROUTINE W REFLEX MICROSCOPIC
Bilirubin Urine: NEGATIVE
Glucose, UA: NEGATIVE mg/dL
Hgb urine dipstick: NEGATIVE
Ketones, ur: 20 mg/dL — AB
Nitrite: NEGATIVE
Protein, ur: >=300 mg/dL — AB
Specific Gravity, Urine: 1.019 (ref 1.005–1.030)
WBC, UA: >50 WBC/hpf — ABNORMAL HIGH (ref 0–5)
pH: 5 (ref 5.0–8.0)

## 2021-12-03 LAB — LIPASE, BLOOD: Lipase: 101 U/L — ABNORMAL HIGH (ref 11–51)

## 2021-12-03 MED ORDER — SODIUM CHLORIDE 0.9 % IV SOLN: INTRAVENOUS | Status: DC

## 2021-12-03 MED ORDER — SODIUM CHLORIDE 0.9 % IV BOLUS
500.0000 mL | Freq: Once | INTRAVENOUS | Status: AC
  Administered 2021-12-03: 500 mL via INTRAVENOUS

## 2021-12-03 NOTE — ED Notes (Signed)
Pt ambulatory with cane.

## 2021-12-03 NOTE — ED Triage Notes (Signed)
Pt reports diarrhea x 1 week with abd cramping. Pt reports starting abt on 11/28/2021 with no relief. Denies n/v.

## 2021-12-03 NOTE — ED Provider Triage Note (Signed)
Emergency Medicine Provider Triage Evaluation Note  KHYLA MCCUMBERS , a 71 y.o. female  was evaluated in triage.  Pt complains of diarrhea x1 week. Patient evaluated at [facility] yesterday and was offered admission for AKI; however didn't want to be admitted there because all of her doctors are here in [place]. She has been taking imodium with no relief.   Review of Systems  Positive: diarrhea Negative: CP  Physical Exam  BP (!) 119/50 (BP Location: Left Arm)   Pulse 86   Temp 98.4 F (36.9 C) (Oral)   Resp 17   Ht '5\' 2"'$  (1.575 m)   Wt 49.9 kg   SpO2 100%   BMI 20.12 kg/m  Gen:   Awake, no distress   Resp:  Normal effort  MSK:   Moves extremities without difficulty  Other:    Medical Decision Making  Medically screening exam initiated at 8:54 PM.  Appropriate orders placed.  FRANSHESCA CHIPMAN was informed that the remainder of the evaluation will be completed by another provider, this initial triage assessment does not replace that evaluation, and the importance of remaining in the ED until their evaluation is complete.  Repeat labs   Karie Kirks 12/03/21 2059

## 2021-12-04 DIAGNOSIS — E860 Dehydration: Secondary | ICD-10-CM | POA: Diagnosis present

## 2021-12-04 DIAGNOSIS — N179 Acute kidney failure, unspecified: Secondary | ICD-10-CM | POA: Diagnosis present

## 2021-12-04 DIAGNOSIS — M545 Low back pain, unspecified: Secondary | ICD-10-CM | POA: Diagnosis present

## 2021-12-04 DIAGNOSIS — M810 Age-related osteoporosis without current pathological fracture: Secondary | ICD-10-CM | POA: Diagnosis present

## 2021-12-04 DIAGNOSIS — I7143 Infrarenal abdominal aortic aneurysm, without rupture: Secondary | ICD-10-CM | POA: Diagnosis present

## 2021-12-04 DIAGNOSIS — G90512 Complex regional pain syndrome I of left upper limb: Secondary | ICD-10-CM | POA: Diagnosis present

## 2021-12-04 DIAGNOSIS — I2510 Atherosclerotic heart disease of native coronary artery without angina pectoris: Secondary | ICD-10-CM | POA: Diagnosis not present

## 2021-12-04 DIAGNOSIS — I341 Nonrheumatic mitral (valve) prolapse: Secondary | ICD-10-CM | POA: Diagnosis present

## 2021-12-04 DIAGNOSIS — C50212 Malignant neoplasm of upper-inner quadrant of left female breast: Secondary | ICD-10-CM

## 2021-12-04 DIAGNOSIS — M797 Fibromyalgia: Secondary | ICD-10-CM | POA: Diagnosis present

## 2021-12-04 DIAGNOSIS — A0471 Enterocolitis due to Clostridium difficile, recurrent: Secondary | ICD-10-CM | POA: Diagnosis present

## 2021-12-04 DIAGNOSIS — Z720 Tobacco use: Secondary | ICD-10-CM | POA: Diagnosis present

## 2021-12-04 DIAGNOSIS — D631 Anemia in chronic kidney disease: Secondary | ICD-10-CM | POA: Diagnosis present

## 2021-12-04 DIAGNOSIS — D638 Anemia in other chronic diseases classified elsewhere: Secondary | ICD-10-CM | POA: Diagnosis not present

## 2021-12-04 DIAGNOSIS — I700 Atherosclerosis of aorta: Secondary | ICD-10-CM

## 2021-12-04 DIAGNOSIS — I739 Peripheral vascular disease, unspecified: Secondary | ICD-10-CM | POA: Diagnosis present

## 2021-12-04 DIAGNOSIS — E876 Hypokalemia: Secondary | ICD-10-CM | POA: Diagnosis present

## 2021-12-04 DIAGNOSIS — I714 Abdominal aortic aneurysm, without rupture, unspecified: Secondary | ICD-10-CM

## 2021-12-04 DIAGNOSIS — I129 Hypertensive chronic kidney disease with stage 1 through stage 4 chronic kidney disease, or unspecified chronic kidney disease: Secondary | ICD-10-CM | POA: Diagnosis present

## 2021-12-04 DIAGNOSIS — Z981 Arthrodesis status: Secondary | ICD-10-CM | POA: Diagnosis not present

## 2021-12-04 DIAGNOSIS — G9332 Myalgic encephalomyelitis/chronic fatigue syndrome: Secondary | ICD-10-CM | POA: Diagnosis present

## 2021-12-04 DIAGNOSIS — M1990 Unspecified osteoarthritis, unspecified site: Secondary | ICD-10-CM | POA: Diagnosis present

## 2021-12-04 DIAGNOSIS — E870 Hyperosmolality and hypernatremia: Secondary | ICD-10-CM | POA: Diagnosis not present

## 2021-12-04 DIAGNOSIS — F329 Major depressive disorder, single episode, unspecified: Secondary | ICD-10-CM | POA: Diagnosis present

## 2021-12-04 DIAGNOSIS — G629 Polyneuropathy, unspecified: Secondary | ICD-10-CM | POA: Diagnosis present

## 2021-12-04 DIAGNOSIS — N1831 Chronic kidney disease, stage 3a: Secondary | ICD-10-CM

## 2021-12-04 DIAGNOSIS — Z170 Estrogen receptor positive status [ER+]: Secondary | ICD-10-CM

## 2021-12-04 DIAGNOSIS — M3500 Sicca syndrome, unspecified: Secondary | ICD-10-CM | POA: Diagnosis present

## 2021-12-04 DIAGNOSIS — R8271 Bacteriuria: Secondary | ICD-10-CM

## 2021-12-04 DIAGNOSIS — F419 Anxiety disorder, unspecified: Secondary | ICD-10-CM | POA: Diagnosis present

## 2021-12-04 LAB — CBC
HCT: 34.3 % — ABNORMAL LOW (ref 36.0–46.0)
Hemoglobin: 11.1 g/dL — ABNORMAL LOW (ref 12.0–15.0)
MCH: 31 pg (ref 26.0–34.0)
MCHC: 32.4 g/dL (ref 30.0–36.0)
MCV: 95.8 fL (ref 80.0–100.0)
Platelets: 246 10*3/uL (ref 150–400)
RBC: 3.58 MIL/uL — ABNORMAL LOW (ref 3.87–5.11)
RDW: 14.3 % (ref 11.5–15.5)
WBC: 8.2 10*3/uL (ref 4.0–10.5)
nRBC: 0 % (ref 0.0–0.2)

## 2021-12-04 LAB — BASIC METABOLIC PANEL
Anion gap: 7 (ref 5–15)
BUN: 16 mg/dL (ref 8–23)
CO2: 20 mmol/L — ABNORMAL LOW (ref 22–32)
Calcium: 8.5 mg/dL — ABNORMAL LOW (ref 8.9–10.3)
Chloride: 118 mmol/L — ABNORMAL HIGH (ref 98–111)
Creatinine, Ser: 2.59 mg/dL — ABNORMAL HIGH (ref 0.44–1.00)
GFR, Estimated: 19 mL/min — ABNORMAL LOW (ref >60)
Glucose, Bld: 56 mg/dL — ABNORMAL LOW (ref 70–99)
Potassium: 3.1 mmol/L — ABNORMAL LOW (ref 3.5–5.1)
Sodium: 145 mmol/L (ref 135–145)

## 2021-12-04 LAB — RAPID URINE DRUG SCREEN, HOSP PERFORMED
Amphetamines: NOT DETECTED
Barbiturates: NOT DETECTED
Benzodiazepines: POSITIVE — AB
Cocaine: NOT DETECTED
Opiates: NOT DETECTED
Tetrahydrocannabinol: NOT DETECTED

## 2021-12-04 LAB — CK: Total CK: 88 U/L (ref 38–234)

## 2021-12-04 MED ORDER — B COMPLEX-C PO TABS
1.0000 | ORAL_TABLET | Freq: Every day | ORAL | Status: DC
  Administered 2021-12-04 – 2021-12-07 (×4): 1 via ORAL
  Filled 2021-12-04 (×4): qty 1

## 2021-12-04 MED ORDER — OXYCODONE-ACETAMINOPHEN 10-325 MG PO TABS: 1.0000 | ORAL_TABLET | Freq: Four times a day (QID) | ORAL | Status: DC | PRN

## 2021-12-04 MED ORDER — ACETAMINOPHEN 325 MG PO TABS: 650.0000 mg | ORAL_TABLET | Freq: Four times a day (QID) | ORAL | Status: DC | PRN

## 2021-12-04 MED ORDER — VITAMIN C 500 MG PO TABS
1000.0000 mg | ORAL_TABLET | Freq: Every day | ORAL | Status: DC
  Administered 2021-12-04 – 2021-12-06 (×3): 1000 mg via ORAL
  Filled 2021-12-04 (×4): qty 2

## 2021-12-04 MED ORDER — CALCIUM CARBONATE 1250 (500 CA) MG PO TABS
1.0000 | ORAL_TABLET | Freq: Every day | ORAL | Status: DC
  Administered 2021-12-04 – 2021-12-06 (×3): 1250 mg via ORAL
  Filled 2021-12-04 (×3): qty 1

## 2021-12-04 MED ORDER — HYDRALAZINE HCL 20 MG/ML IJ SOLN
10.0000 mg | INTRAMUSCULAR | Status: DC | PRN
  Administered 2021-12-04: 10 mg via INTRAVENOUS
  Filled 2021-12-04: qty 1

## 2021-12-04 MED ORDER — ONDANSETRON HCL 4 MG/2ML IJ SOLN: 4.0000 mg | Freq: Four times a day (QID) | INTRAMUSCULAR | Status: DC | PRN

## 2021-12-04 MED ORDER — EZETIMIBE 10 MG PO TABS
10.0000 mg | ORAL_TABLET | Freq: Every day | ORAL | Status: DC
  Administered 2021-12-04 – 2021-12-06 (×3): 10 mg via ORAL
  Filled 2021-12-04 (×3): qty 1

## 2021-12-04 MED ORDER — ALPRAZOLAM 1 MG PO TABS
1.0000 mg | ORAL_TABLET | Freq: Three times a day (TID) | ORAL | Status: DC | PRN
  Administered 2021-12-04 – 2021-12-06 (×2): 1 mg via ORAL
  Filled 2021-12-04 (×2): qty 1

## 2021-12-04 MED ORDER — FIDAXOMICIN 200 MG PO TABS
200.0000 mg | ORAL_TABLET | Freq: Two times a day (BID) | ORAL | Status: DC
  Administered 2021-12-04 – 2021-12-07 (×7): 200 mg via ORAL
  Filled 2021-12-04 (×7): qty 1

## 2021-12-04 MED ORDER — ONDANSETRON HCL 4 MG PO TABS: 4.0000 mg | ORAL_TABLET | Freq: Four times a day (QID) | ORAL | Status: DC | PRN

## 2021-12-04 MED ORDER — SODIUM CHLORIDE 0.9 % IV BOLUS
1000.0000 mL | Freq: Once | INTRAVENOUS | Status: AC
  Administered 2021-12-04: 1000 mL via INTRAVENOUS

## 2021-12-04 MED ORDER — INFLUENZA VAC A&B SA ADJ QUAD 0.5 ML IM PRSY: 0.5000 mL | PREFILLED_SYRINGE | INTRAMUSCULAR | Status: DC | PRN

## 2021-12-04 MED ORDER — LIDOCAINE 5 % EX PTCH
1.0000 | MEDICATED_PATCH | Freq: Every evening | CUTANEOUS | Status: DC | PRN
Start: 2021-12-04 — End: 2021-12-04

## 2021-12-04 MED ORDER — ACETAMINOPHEN 325 MG PO TABS
325.0000 mg | ORAL_TABLET | Freq: Four times a day (QID) | ORAL | Status: DC | PRN
  Administered 2021-12-04 – 2021-12-05 (×2): 325 mg via ORAL
  Filled 2021-12-04 (×2): qty 1

## 2021-12-04 MED ORDER — BREXPIPRAZOLE 1 MG PO TABS
1.0000 mg | ORAL_TABLET | Freq: Every morning | ORAL | Status: DC
  Administered 2021-12-04 – 2021-12-07 (×4): 1 mg via ORAL
  Filled 2021-12-04 (×4): qty 1

## 2021-12-04 MED ORDER — ANASTROZOLE 1 MG PO TABS
1.0000 mg | ORAL_TABLET | Freq: Every day | ORAL | Status: DC
  Administered 2021-12-04 – 2021-12-06 (×3): 1 mg via ORAL
  Filled 2021-12-04 (×3): qty 1

## 2021-12-04 MED ORDER — GABAPENTIN 300 MG PO CAPS
300.0000 mg | ORAL_CAPSULE | Freq: Every day | ORAL | Status: DC
  Administered 2021-12-04 – 2021-12-07 (×4): 300 mg via ORAL
  Filled 2021-12-04 (×4): qty 1

## 2021-12-04 MED ORDER — ALBUTEROL SULFATE (2.5 MG/3ML) 0.083% IN NEBU: 2.5000 mg | INHALATION_SOLUTION | RESPIRATORY_TRACT | Status: DC | PRN

## 2021-12-04 MED ORDER — ONDANSETRON HCL 4 MG/2ML IJ SOLN
4.0000 mg | Freq: Once | INTRAMUSCULAR | Status: AC
  Administered 2021-12-04: 4 mg via INTRAVENOUS
  Filled 2021-12-04: qty 2

## 2021-12-04 MED ORDER — NICOTINE 14 MG/24HR TD PT24
14.0000 mg | MEDICATED_PATCH | Freq: Every day | TRANSDERMAL | Status: DC
  Filled 2021-12-04 (×4): qty 1

## 2021-12-04 MED ORDER — POTASSIUM CHLORIDE 10 MEQ/100ML IV SOLN
10.0000 meq | INTRAVENOUS | Status: AC
  Administered 2021-12-04 (×4): 10 meq via INTRAVENOUS
  Filled 2021-12-04 (×4): qty 100

## 2021-12-04 MED ORDER — VITAMIN D 25 MCG (1000 UNIT) PO TABS
2000.0000 [IU] | ORAL_TABLET | Freq: Every day | ORAL | Status: DC
  Administered 2021-12-04 – 2021-12-06 (×3): 2000 [IU] via ORAL
  Filled 2021-12-04 (×3): qty 2

## 2021-12-04 MED ORDER — ROSUVASTATIN CALCIUM 20 MG PO TABS
20.0000 mg | ORAL_TABLET | Freq: Every day | ORAL | Status: DC
  Administered 2021-12-04 – 2021-12-06 (×3): 20 mg via ORAL
  Filled 2021-12-04 (×3): qty 1

## 2021-12-04 MED ORDER — OXYCODONE-ACETAMINOPHEN 5-325 MG PO TABS
1.0000 | ORAL_TABLET | Freq: Four times a day (QID) | ORAL | Status: DC | PRN
  Administered 2021-12-04 – 2021-12-05 (×3): 1 via ORAL
  Filled 2021-12-04 (×3): qty 1

## 2021-12-04 MED ORDER — POLYETHYLENE GLYCOL 3350 17 G PO PACK
17.0000 g | PACK | Freq: Every day | ORAL | Status: DC | PRN
Start: 2021-12-04 — End: 2021-12-07

## 2021-12-04 MED ORDER — OXYCODONE HCL 5 MG PO TABS
5.0000 mg | ORAL_TABLET | Freq: Four times a day (QID) | ORAL | Status: DC | PRN
  Administered 2021-12-04 – 2021-12-06 (×7): 5 mg via ORAL
  Filled 2021-12-04 (×7): qty 1

## 2021-12-04 MED ORDER — MECLIZINE HCL 25 MG PO TABS: 25.0000 mg | ORAL_TABLET | Freq: Four times a day (QID) | ORAL | Status: DC | PRN

## 2021-12-04 MED ORDER — POTASSIUM CHLORIDE IN NACL 20-0.9 MEQ/L-% IV SOLN
INTRAVENOUS | Status: AC
  Filled 2021-12-04 (×2): qty 1000

## 2021-12-04 MED ORDER — NALOXEGOL OXALATE 25 MG PO TABS: 25.0000 mg | ORAL_TABLET | Freq: Every day | ORAL | Status: DC | PRN

## 2021-12-04 MED ORDER — LIDOCAINE 5 % EX PTCH
1.0000 | MEDICATED_PATCH | Freq: Every day | CUTANEOUS | Status: DC | PRN
  Administered 2021-12-04: 1 via TRANSDERMAL
  Filled 2021-12-04 (×2): qty 1

## 2021-12-04 MED ORDER — SODIUM CHLORIDE 0.9 % IV SOLN: INTRAVENOUS | Status: DC

## 2021-12-04 MED ORDER — ASPIRIN 81 MG PO TBEC
81.0000 mg | DELAYED_RELEASE_TABLET | Freq: Every day | ORAL | Status: DC
  Administered 2021-12-04 – 2021-12-07 (×4): 81 mg via ORAL
  Filled 2021-12-04 (×4): qty 1

## 2021-12-04 MED ORDER — GABAPENTIN 600 MG PO TABS: 600.0000 mg | ORAL_TABLET | ORAL | Status: DC

## 2021-12-04 MED ORDER — VENLAFAXINE HCL ER 150 MG PO CP24
150.0000 mg | ORAL_CAPSULE | Freq: Every day | ORAL | Status: DC
  Administered 2021-12-04 – 2021-12-07 (×4): 150 mg via ORAL
  Filled 2021-12-04 (×4): qty 1

## 2021-12-04 MED ORDER — ACETAMINOPHEN 650 MG RE SUPP: 650.0000 mg | Freq: Four times a day (QID) | RECTAL | Status: DC | PRN

## 2021-12-04 MED ORDER — FOSFOMYCIN TROMETHAMINE 3 G PO PACK
3.0000 g | PACK | Freq: Once | ORAL | Status: AC
Start: 2021-12-04 — End: 2021-12-04
  Administered 2021-12-04: 3 g via ORAL
  Filled 2021-12-04: qty 3

## 2021-12-04 MED ORDER — VENLAFAXINE HCL ER 150 MG PO CP24
150.0000 mg | ORAL_CAPSULE | Freq: Two times a day (BID) | ORAL | Status: DC
Start: 2021-12-04 — End: 2021-12-04

## 2021-12-04 NOTE — H&P (Addendum)
History and Physical    Patient: Connie Bennett HMC:[id number] DOB: Apr 21, 1950 DOA: 12/03/2021 DOS: the patient was seen and examined on 12/04/2021 PCP: Mayra Neer, MD  Patient coming from: Home  Chief Complaint:  Chief Complaint  Patient presents with   Diarrhea   HPI: Connie Bennett is a 71 y.o. female with medical history significant of anxiety, lower extremity edema, chronic fatigue syndrome, chronic lower back pain, spinal cord stimulator, fibromyalgia, depression, history of heart murmur, history of bronchitis, colon polyps, history of oxycodone OD, history of bilateral lower extremity DVT, hiatal hernia, hypertension, hyperlipidemia, mitral valve prolapse, osteoporosis, peripheral neuropathy, peripheral vascular disease, reflex sympathetic dystrophy, Sjogren syndromes, vertigo who is coming to the emergency department due to diarrhea, dehydration and lightheadedness in the setting of recurrent C. difficile colitis, which she is being treated with a second round of Dificid.  She also has some dysuria when she collected her urinalysis, but has not had dysuria since.  She stated that her urine looked very dark and concentrated initially and has become lighter in color since.  Her appetite is decreased and she may get occasionally mildly nauseous, but no vomiting, constipation, melena or hematochezia.  No flank pain, frequency or hematuria.  No fever, chills or night sweats. No sore throat, rhinorrhea, dyspnea, wheezing or hemoptysis.  No chest pain, palpitations, diaphoresis, PND, orthopnea or pitting edema of the lower extremities.  No polyuria, polydipsia, polyphagia or blurred vision.  ED course: Initial vital signs were temperature 98.4 F, pulse 86, respirations 17, BP 119/50 mmHg O2 sat 100% on room air.  The patient received a 500 mL NS bolus, ondansetron 4 4 mg IVP, fosfomycin 3 g p.o. x1 and KCl 10 mEq IVPB x4 doses.  Lab work: Her urinalysis was cloudy with ketonuria 20 and  proteinuria more than 300 mg/dL.  There was large leukocyte esterase, 11-20 RBC, more than 50 WBC and few bacteria.  UDS was positive for benzodiazepines.  CBC showed a white count 6.9, hemoglobin 11.2 g/dL platelets 251.  Lipase was 101.  CMP showed a potassium of 2.8 and chloride 115 mmol/L.  Creatinine was 2.91 mg/dL.  Her recent baseline creatinine is usually between 1.3 to 1.4 mg/dL.  Imaging: CT renal study with no acute intra-abdominal pathology identified.  There is extensive right coronary artery calcification.  There is a 3.1 cm infrarenal AAA with recommendation for ultrasound follow-up every 3 years.  There is aortic atherosclerosis.  Review of Systems: As mentioned in the history of present illness. All other systems reviewed and are negative.  Past Medical History:  Diagnosis Date   Anxiety    takes Xanax daily as needed   Arthritis    Bilateral lower extremity edema    takes Furosemide daily as needed   Breast cancer [facility]) oncologist-  dr Truitt Merle--  right upper-outer quadrant    dx May 2016--- Stage 0  (Tis,N0,M0)  DCIS  Right breast---  09-13-2014  s/p  radioactive seed/ partial mastectomy / sln bx   Chronic fatigue syndrome    Chronic low back pain    Complication of anesthesia    spinal cord stimulator- to be off for surgery   Depression    takes Effexor daily   Family history of adverse reaction to anesthesia    mom hard to wake up   Fibromyalgia    Headache(784.0)    couple of times a week   Heart murmur    History of bronchitis > 47yr ago  History of colon polyps    benign   History of drug overdose    oxycontin  and oxycodone  Sept 2015--  now has narcan injection prescription   History of DVT of lower extremity    2008--  BILATERAL   History of hiatal hernia    HTN (hypertension)    takes Amlodipine and Ramipril daily   Hyperlipidemia    takes Zetia and Crestor  daily   Joint pain    Mitral valve prolapse    MILD /   PER PT ASYMPTOMATIC   Muscle  spasm    takes Zanaflex daily as needed   Osteoporosis    Peripheral neuropathy    takes Neurontin daily   Peripheral vascular disease (HCC)    hx dvt's   RSD (reflex sympathetic dystrophy)    left wrist and forearm from a fx   RSD (reflex sympathetic dystrophy)    Sjogren's disease ([employer] [facility])    Vertigo    takes Meclizine daily    Past Surgical History:  Procedure Laterality Date   BACK SURGERY     BREAST RECONSTRUCTION WITH PLACEMENT OF TISSUE EXPANDER AND FLEX HD (ACELLULAR HYDRATED DERMIS) Right 01/25/2015   Procedure: RIGHT BREAST RECONSTRUCTION WITH PLACEMENT OF IMPLANT AND ACELLULAR DERMAL MATRIX ;  Surgeon: Crissie Reese, MD;  Location: [place];  Service: Plastics;  Laterality: Right;   BREAST RECONSTRUCTION WITH PLACEMENT OF TISSUE EXPANDER AND FLEX HD (ACELLULAR HYDRATED DERMIS) Left 07/05/2015   Procedure: LEFT BREAST RECONSTRUCTION WITH PLACEMENT OF TISSUE EXPANDER AND  ACELLULAR DERMAL MATRIX ;  Surgeon: Crissie Reese, MD;  Location: [place];  Service: Plastics;  Laterality: Left;   COLONOSCOPY     ESOPHAGOGASTRODUODENOSCOPY     eye lid surgery Bilateral    FOOT SURGERY Bilateral 1996   MORTON'S NEUROMA   INNER EAR SURGERY Right    LUMBAR [place] SURGERY     MASTECTOMY Right 01/25/2015   nipple sparing    NASAL SINUS SURGERY     NIPPLE SPARING MASTECTOMY/SENTINAL LYMPH NODE BIOPSY/RECONSTRUCTION/PLACEMENT OF TISSUE EXPANDER Left 07/05/2015   Procedure: LEFT NIPPLE SPARING MASTECTOMY WITH SENTINAL LYMPH NODE BIOPSY ;  Surgeon: [name], MD;  Location: [place];  Service: General;  Laterality: Left;   OPEN REDUCTION INTERNAL FIXATION (ORIF) DISTAL RADIAL FRACTURE Right 08/09/2018   Procedure: OPEN REDUCTION INTERNAL FIXATION (ORIF) DISTAL RADIAL FRACTURE;  Surgeon: Milly Jakob, MD;  Location: [place];  Service: Orthopedics;  Laterality: Right;   ORIF LEFT WRIST FX'S/  LEFT CARPAL TUNNEL RELEASE  1999   POSTERIOR LUMBAR FUSION  01-30-2009   L4--5 Laminectmy w/  decompression/  bilateral L4--5 microdiskectomy and fusion   RADIOACTIVE SEED GUIDED PARTIAL MASTECTOMY WITH AXILLARY SENTINEL LYMPH NODE BIOPSY Right 09/13/2014   Procedure: RADIOACTIVE SEED GUIDED PARTIAL MASTECTOMY WITH AXILLARY SENTINEL LYMPH NODE BIOPSY;  Surgeon: [name], MD;  Location: [place];  Service: General;  Laterality: Right;   RE-EXCISION OF BREAST LUMPECTOMY Right 09/29/2014   Procedure: RE-EXCISION OF BREAST LUMPECTOMY;  Surgeon: [name], MD;  Location: [place];  Service: General;  Laterality: Right;   RIGHT CARPAL TUNNEL RELEASE/  TRIGGER RELEASE RIGHT MIDDLE FINGER  06-18-2006   RIGHT INFERIOR PARATHYROIDECTOMY  04-07-2006   SIMPLE MASTECTOMY WITH AXILLARY SENTINEL NODE BIOPSY Right 01/25/2015   Procedure: RIGHT NIPPLE [place];  Surgeon: [name], MD;  Location: [place];  Service: General;  Laterality: Right;   SPINAL CORD STIMULATOR IMPLANT  2013   TONSILLECTOMY  as  child   Social History:  reports that she has been smoking cigarettes. She has a 6.25 pack-year smoking history. She has never used smokeless tobacco. She reports current alcohol use. She reports that she does not use drugs.  Allergies  Allergen Reactions   Carbocaine [Mepivacaine Hcl] Hives and Swelling   Penicillins Nausea And Vomiting, Swelling, Rash and Other (See Comments)    Has patient had a PCN reaction causing immediate rash, facial/tongue/throat swelling, SOB or lightheadedness with hypotension: Yes Has patient had a PCN reaction causing severe rash involving mucus membranes or skin necrosis: No Has patient had a PCN reaction that required hospitalization No Has patient had a PCN reaction occurring within the last 10 years: Yes If all of the above answers are "NO", then may proceed with Cephalosporin use.   Sulfa Antibiotics Nausea And Vomiting, Swelling, Rash and Other (See Comments)    Headache    Latex Rash and Other (See Comments)     Reaction to gloves and bandaids (paper tape is ok)    Family History  Problem Relation Age of Onset   Heart murmur Mother    Cancer Mother 81       carcinoid tumor    Hypertension Mother    Heart murmur Sister        x2   Cancer Maternal Aunt        lung cancer   Cancer Paternal Aunt        breast cancer    Cancer Maternal Aunt        gastric cancer    Cancer Cousin        lung cancer   Cancer Cousin        bone cancer    Heart attack Maternal Uncle    Heart attack Paternal Uncle    Hypertension Son    Stroke Neg Hx     Prior to Admission medications   Medication Sig Start Date End Date Taking? Authorizing Provider  acetaminophen (TYLENOL) 500 MG tablet Take 1,000 mg by mouth every 6 (six) hours as needed (pain).   Yes [provider]  ALPRAZolam Duanne Moron) 1 MG tablet Take 1 mg by mouth 3 (three) times daily as needed for anxiety or sleep (nerves). 07/15/21  Yes [provider]  anastrozole (ARIMIDEX) 1 MG tablet TAKE 1 TABLET BY MOUTH EVERY DAY Patient taking differently: Take 1 mg by mouth at bedtime. 06/01/21  Yes Truitt Merle, MD  Ascorbic Acid (VITAMIN C) 1000 MG tablet Take 1,000 mg by mouth daily with lunch.   Yes [provider]  B Complex-C (B-COMPLEX WITH VITAMIN C) tablet Take 1 tablet by mouth daily. 07/09/21  Yes Lavina Hamman, MD  brexpiprazole (REXULTI) 1 MG TABS tablet Take 1 mg by mouth every morning.   Yes [provider]  calcium carbonate (OS-CAL) 600 MG TABS tablet Take 600 mg by mouth daily with lunch.   Yes [provider]  Cholecalciferol (VITAMIN D) 50 MCG (2000 UT) tablet Take 2,000 Units by mouth daily with lunch.   Yes [provider]  DIFICID 200 MG TABS tablet Take 200 mg by mouth 2 (two) times daily. 11/28/21  Yes [provider]  ezetimibe (ZETIA) 10 MG tablet Take 10 mg by mouth at bedtime.    Yes [provider]  ferrous sulfate 325 (65 FE) MG tablet Take 1 tablet (325 mg total)  by mouth daily with breakfast. 07/10/21  Yes Lavina Hamman, MD  Flaxseed, Linseed, (FLAX  SEED OIL) 1000 MG CAPS Take 1,000 mg by mouth daily with lunch.   Yes [provider]  furosemide (LASIX) 40 MG tablet Take 40 mg by mouth daily as needed for fluid or edema (leg swelling). Reported on 06/29/2015 08/21/14  Yes [provider]  gabapentin (NEURONTIN) 600 MG tablet Take 1 tablet (600 mg total) by mouth at bedtime for 14 days. Take one tablet (600 mg) by mouth every morning and two tablets (1200 mg) at night Patient taking differently: Take 600-1,200 mg by mouth See admin instructions. Take one tablet (600 mg) by mouth every morning, one tablet (600 mg) and two tablets (1200 mg) at night 07/28/21 12/05/22 Yes Hall, Carole N, DO  Lactobacillus (PROBIOTIC ACIDOPHILUS PO) Take 1 tablet by mouth daily with lunch.   Yes [provider]  meclizine (ANTIVERT) 25 MG tablet Take 25 mg by mouth 4 (four) times daily as needed for dizziness. 05/08/21  Yes [provider]  naloxegol oxalate (MOVANTIK) 25 MG TABS tablet Take 25 mg by mouth daily as needed (for constipation). 08/01/21  Yes [provider]  Omega-3 Fatty Acids (FISH OIL) 1000 MG CAPS Take 1,000 mg by mouth daily with lunch.   Yes [provider]  oxyCODONE-acetaminophen (PERCOCET) 10-325 MG tablet Take 1 tablet by mouth 5 (five) times daily as needed for pain.   Yes [provider]  polyethylene glycol (MIRALAX / GLYCOLAX) 17 g packet Take 17 g by mouth daily as needed for mild constipation. 07/28/21  Yes Hall, Carole N, DO  PROAIR HFA [phone] Base) MCG/ACT inhaler Inhale 2 puffs into the lungs every 4 (four) hours as needed for wheezing or shortness of breath. 08/06/16  Yes [provider]  ramipril (ALTACE) 10 MG capsule Take 10 mg by mouth every morning.   Yes [provider]  rosuvastatin (CRESTOR) 20 MG tablet Take 20 mg by mouth at bedtime.   Yes [provider]   venlafaxine (EFFEXOR-XR) 150 MG 24 hr capsule Take 150 mg by mouth 2 (two) times daily with breakfast and lunch.   Yes [provider]  furosemide (LASIX) 20 MG tablet Take 1 tablet (20 mg total) by mouth daily. As needed for lower extremity swelling Patient taking differently: Take 20 mg by mouth daily as needed for edema (for lower extremity swelling). 08/15/21   Carlyle Basques, MD  lidocaine (LIDODERM) 5 % Place 1 patch onto the skin at bedtime as needed (pain). 05/02/21   [provider]  naloxone [facility]) nasal spray 4 mg/0.1 mL Place 1 spray into the nose once as needed (for opioid overdose). 10/23/21   [provider]    Physical Exam: Vitals:   12/04/21 0000 12/04/21 0030 12/04/21 0200 12/04/21 0518  BP: 127/66 (!) 143/83 (!) 154/74 (!) 161/80  Pulse: 72 75 83 80  Resp: '17 17 18 18  '$ Temp:  98.4 F (36.9 C)  98.1 F (36.7 C)  TempSrc:    Oral  SpO2: 100% 100% 100% 100%  Weight:      Height:       Physical Exam Vitals and nursing note reviewed.  Constitutional:      Appearance: Normal appearance. She is normal weight.  HENT:     Head: Normocephalic.     Nose: Nose normal. No rhinorrhea.     Mouth/Throat:     Mouth: Mucous membranes are dry.  Eyes:     General: No scleral icterus.    Pupils: Pupils are equal, round, and  reactive to light.  Cardiovascular:     Rate and Rhythm: Normal rate and regular rhythm.  Pulmonary:     Effort: Pulmonary effort is normal.     Breath sounds: Normal breath sounds.  Abdominal:     General: Bowel sounds are normal. There is no distension.     Palpations: Abdomen is soft.     Tenderness: There is no abdominal tenderness. There is no right CVA tenderness, left CVA tenderness or guarding.  Musculoskeletal:     Cervical back: Neck supple.     Right lower leg: No edema.     Left lower leg: No edema.  Skin:    General: Skin is warm and dry.  Neurological:     General: No focal deficit present.     Mental  Status: She is alert and oriented to person, place, and time.  Psychiatric:        Mood and Affect: Mood normal.   Data Reviewed:  Results are pending, will review when available.  Assessment and Plan: Principal Problem:   Acute renal failure superimposed  on stage 3a chronic kidney disease ([place]) Observation/telemetry. Continue IV fluids. Hold ARB/ACE. Hold diuretic. Avoid hypotension. Avoid nephrotoxins. Monitor intake and output. Monitor renal function electrolytes.  Active Problems:   Recurrent Clostridioides difficile diarrhea Discontinue Movantik. Minimize oxycodone use for pain. Continue Dificid twice daily. Briefly discussed with ID. No changes in treatment.    Asymptomatic bacteriuria Dysuria likely from concentrated urine. No further symptoms since then. Briefly discussed with ID on-call. Deferred antibiotic therapy due to C. difficile.    Hypokalemia Replacing. Follow potassium level.    Essential hypertension Holding furosemide and ramipril. Monitor BP, renal function electrolytes.    Major depressive disorder Continue Wellbutrin 150 mg p.o. twice daily. Follow-up with PCP and behavioral health as scheduled.    Breast cancer of upper-inner quadrant of left female breast (HCC) Continue Arimidex 1 mg p.o. daily. Follow-up at the cancer center as scheduled.    Anemia of chronic disease Monitor hematocrit and hemoglobin.    CAD (coronary artery disease) No complaints of angina. Already on aspirin and statin. Had benign work-up 7 years ago. Check echocardiogram. Outpatient follow-up with cardiology.    Abdominal aortic aneurysm (AAA)  3.0 cm to 5.0 cm in diameter in female [facility]) Discussed with the patient. She will follow-up with ultrasound with her PCP.    Aortic atherosclerosis (HCC)   Mixed hyperlipidemia Continue rosuvastatin 20 mg p.o. daily.    Tobacco use NicoDerm 14 mg patch daily. Tobacco cessation advised.    Advance Care  Planning:   Code Status: Full Code   Consults:   Family Communication:   Severity of Illness: The appropriate patient status for this patient is INPATIENT. Inpatient status is judged to be reasonable and necessary in order to provide the required intensity of service to ensure the patient's safety. The patient's presenting symptoms, physical exam findings, and initial radiographic and laboratory data in the context of their chronic comorbidities is felt to place them at high risk for further clinical deterioration. Furthermore, it is not anticipated that the patient will be medically stable for discharge from the hospital within 2 midnights of admission.   * I certify that at the point of admission it is my clinical judgment that the patient will require inpatient hospital care spanning beyond 2 midnights from the point of admission due to high intensity of service, high risk for further deterioration and high frequency of surveillance required.*  Author: Shanon Brow  Judieth Keens, MD 12/04/2021 8:11 AM  For on call review [email or web address].   This document was prepared using Dragon voice recognition software and may contain some unintended transcription errors.

## 2021-12-04 NOTE — ED Provider Notes (Signed)
[facility] DEPT Provider Note   CSN: [id number] Arrival date & time: 12/03/21  2016     History  Chief Complaint  Patient presents with   Diarrhea    PEARLINE YERBY is a 71 y.o. female.  The history is provided by the patient.  Diarrhea Quality:  Watery Severity:  Severe Onset quality:  Gradual Number of episodes:  7-8 Duration:  1 week Timing:  Intermittent Progression:  Unchanged Relieved by:  Nothing Worsened by:  Nothing Ineffective treatments:  None tried Associated symptoms: no fever and no vomiting   Risk factors: no recent antibiotic use   Patient seen yesterday at [facility] for diarrhea and decided against admission presents for ongoing diarrhea today.  No fevers or chills.  No nausea or vomiting.  No recent antibiotics.      Past Medical History:  Diagnosis Date   Anxiety    takes Xanax daily as needed   Arthritis    Bilateral lower extremity edema    takes Furosemide daily as needed   Breast cancer [facility]) oncologist-  dr Truitt Merle--  right upper-outer quadrant    dx May 2016--- Stage 0  (Tis,N0,M0)  DCIS  Right breast---  09-13-2014  s/p  radioactive seed/ partial mastectomy / sln bx   Chronic fatigue syndrome    Chronic low back pain    Complication of anesthesia    spinal cord stimulator- to be off for surgery   Depression    takes Effexor daily   Family history of adverse reaction to anesthesia    mom hard to wake up   Fibromyalgia    Headache(784.0)    couple of times a week   Heart murmur    History of bronchitis > 39yr ago   History of colon polyps    benign   History of drug overdose    oxycontin  and oxycodone  Sept 2015--  now has narcan injection prescription   History of DVT of lower extremity    2008--  BILATERAL   History of hiatal hernia    HTN (hypertension)    takes Amlodipine and Ramipril daily   Hyperlipidemia    takes Zetia and Crestor  daily   Joint pain    Mitral valve prolapse    MILD /    PER PT ASYMPTOMATIC   Muscle spasm    takes Zanaflex daily as needed   Osteoporosis    Peripheral neuropathy    takes Neurontin daily   Peripheral vascular disease (HCC)    hx dvt's   RSD (reflex sympathetic dystrophy)    left wrist and forearm from a fx   RSD (reflex sympathetic dystrophy)    Sjogren's disease ([place]    Vertigo    takes Meclizine daily     Home Medications Prior to Admission medications   Medication Sig Start Date End Date Taking? Authorizing Provider  acetaminophen (TYLENOL) 500 MG tablet Take 1,000 mg by mouth every 6 (six) hours as needed (pain).    [provider]  ALPRAZolam (Duanne Moron 1 MG tablet Take 1 mg by mouth 3 (three) times daily as needed for anxiety or sleep (nerves). 07/15/21   [provider]  anastrozole (ARIMIDEX) 1 MG tablet TAKE 1 TABLET BY MOUTH EVERY DAY Patient taking differently: Take 1 mg by mouth at bedtime. 06/01/21   FTruitt Merle MD  Ascorbic Acid (VITAMIN C) 1000 MG tablet Take 1,000 mg by mouth daily with lunch.    [provider]  B Complex-C (B-COMPLEX WITH VITAMIN C) tablet Take 1 tablet by mouth daily. 07/09/21   Lavina Hamman, MD  brexpiprazole (REXULTI) 1 MG TABS tablet Take 1 mg by mouth every morning.    [provider]  calcium carbonate (OS-CAL) 600 MG TABS tablet Take 600 mg by mouth daily with lunch.    [provider]  cephALEXin (KEFLEX) 500 MG capsule Take 500 mg by mouth 3 (three) times daily.    [provider]  Cholecalciferol (VITAMIN D) 50 MCG (2000 UT) tablet Take 2,000 Units by mouth daily with lunch.    [provider]  clotrimazole (CLOTRIMAZOLE ANTI-FUNGAL) 1 % cream Apply 1 application. topically 2 (two) times daily. 08/15/21   Carlyle Basques, MD  ezetimibe (ZETIA) 10 MG tablet Take 10 mg by mouth at bedtime.     [provider]  ferrous sulfate 325 (65 FE) MG tablet Take 1 tablet (325 mg total) by mouth daily with breakfast. 07/10/21   Lavina Hamman, MD  Flaxseed, Linseed, (FLAX SEED OIL) 1000 MG CAPS Take 1,000 mg by mouth daily with lunch.    [provider]  furosemide (LASIX) 20 MG tablet Take 1 tablet (20 mg total) by mouth daily. As needed for lower extremity swelling 08/15/21   Carlyle Basques, MD  furosemide (LASIX) 40 MG tablet Take 40 mg by mouth daily as needed for fluid or edema (leg swelling). Reported on 06/29/2015 08/21/14   [provider]  gabapentin (NEURONTIN) 600 MG tablet Take 1 tablet (600 mg total) by mouth at bedtime for 14 days. Take one tablet (600 mg) by mouth every morning and two tablets (1200 mg) at night 07/28/21 08/11/21  Kayleen Memos, DO  Lactobacillus (PROBIOTIC ACIDOPHILUS PO) Take 1 tablet by mouth daily with lunch.    [provider]  lidocaine (LIDODERM) 5 % Place 1 patch onto the skin at bedtime as needed (pain). 05/02/21   [provider]  meclizine (ANTIVERT) 25 MG tablet Take 25 mg by mouth 4 (four) times daily as needed for dizziness. 05/08/21   [provider]  naloxegol oxalate (MOVANTIK) 25 MG TABS tablet Take by mouth. 08/01/21   [provider]  Omega-3 Fatty Acids (FISH OIL) 1000 MG CAPS Take 1,000 mg by mouth daily with lunch.    [provider]  oxyCODONE-acetaminophen (PERCOCET) 10-325 MG tablet Take 1 tablet by mouth 5 (five) times daily as needed for pain.    [provider]  polyethylene glycol (MIRALAX / GLYCOLAX) 17 g packet Take 17 g by mouth daily as needed for mild constipation. 07/28/21   Kayleen Memos, DO  PROAIR HFA 108 [phone] Base) MCG/ACT inhaler Inhale 2 puffs into the lungs every 4 (four) hours as needed for wheezing or shortness of breath. 08/06/16   [provider]  ramipril (ALTACE) 10 MG capsule Take 10 mg by mouth every morning.    [provider]  rosuvastatin (CRESTOR) 20 MG tablet Take 20 mg by mouth at bedtime.    [provider]  venlafaxine (EFFEXOR-XR) 150 MG 24 hr capsule  Take 150 mg by mouth 2 (two) times daily with breakfast and lunch.    [provider]      Allergies    Carbocaine [mepivacaine hcl], Penicillins, Sulfa antibiotics, Other, and Latex    Review of Systems   Review of Systems  Constitutional:  Negative for fever.  HENT:  Negative for facial swelling.   Eyes:  Negative for redness.  Respiratory:  Negative for wheezing and stridor.   Cardiovascular:  Negative for leg swelling.  Gastrointestinal:  Positive for diarrhea. Negative for vomiting.  Neurological:  Negative for facial asymmetry.  All other systems reviewed and are negative.   Physical Exam Updated Vital Signs BP (!) 143/83   Pulse 75   Temp 98.4 F (36.9 C)   Resp 17   Ht '5\' 2"'$  (1.575 m)   Wt 49.9 kg   SpO2 100%   BMI 20.12 kg/m  Physical Exam Vitals and nursing note reviewed.  Constitutional:      General: She is not in acute distress.    Appearance: She is well-developed.  HENT:     Head: Normocephalic and atraumatic.     Nose: Nose normal.  Eyes:     Pupils: Pupils are equal, round, and reactive to light.  Cardiovascular:     Rate and Rhythm: Normal rate and regular rhythm.     Pulses: Normal pulses.     Heart sounds: Normal heart sounds.  Pulmonary:     Effort: Pulmonary effort is normal. No respiratory distress.     Breath sounds: Normal breath sounds.  Abdominal:     General: Abdomen is flat. Bowel sounds are normal. There is no distension.     Palpations: Abdomen is soft.     Tenderness: There is no abdominal tenderness. There is no guarding or rebound.  Genitourinary:    Vagina: No vaginal discharge.  Musculoskeletal:        General: Normal range of motion.     Cervical back: Neck supple.  Skin:    General: Skin is warm and dry.     Capillary Refill: Capillary refill takes less than 2 seconds.     Findings: No erythema or rash.  Neurological:     General: No focal deficit present.     Deep Tendon Reflexes: Reflexes normal.   Psychiatric:        Mood and Affect: Mood normal.        Behavior: Behavior normal.     ED Results / Procedures / Treatments   Labs (all labs ordered are listed, but only abnormal results are displayed) Results for orders placed or performed during the hospital encounter of 12/03/21  CBC with Differential  Result Value Ref Range   WBC 6.9 4.0 - 10.5 K/uL   RBC 3.63 (L) 3.87 - 5.11 MIL/uL   Hemoglobin 11.2 (L) 12.0 - 15.0 g/dL   HCT 34.5 (L) 36.0 - 46.0 %   MCV 95.0 80.0 - 100.0 fL   MCH 30.9 26.0 - 34.0 pg   MCHC 32.5 30.0 - 36.0 g/dL   RDW 14.1 11.5 - 15.5 %   Platelets 251 150 - 400 K/uL   nRBC 0.0 0.0 - 0.2 %   Neutrophils Relative % 67 %   Neutro Abs 4.6 1.7 - 7.7 K/uL   Lymphocytes Relative 24 %   Lymphs Abs 1.6 0.7 - 4.0 K/uL   Monocytes Relative 8 %   Monocytes Absolute 0.5 0.1 - 1.0 K/uL   Eosinophils Relative 1 %   Eosinophils Absolute 0.1 0.0 - 0.5 K/uL   Basophils Relative 0 %   Basophils Absolute 0.0 0.0 - 0.1 K/uL   Immature Granulocytes 0 %   Abs Immature Granulocytes 0.02 0.00 - 0.07 K/uL  Comprehensive metabolic panel  Result Value Ref Range   Sodium 145 135 - 145 mmol/L   Potassium 2.8 (L) 3.5 - 5.1 mmol/L   Chloride 115 (  H) 98 - 111 mmol/L   CO2 22 22 - 32 mmol/L   Glucose, Bld 75 70 - 99 mg/dL   BUN 18 8 - 23 mg/dL   Creatinine, Ser 2.91 (H) 0.44 - 1.00 mg/dL   Calcium 8.9 8.9 - 10.3 mg/dL   Total Protein 7.3 6.5 - 8.1 g/dL   Albumin 3.6 3.5 - 5.0 g/dL   AST 32 15 - 41 U/L   ALT 21 0 - 44 U/L   Alkaline Phosphatase 64 38 - 126 U/L   Total Bilirubin 0.6 0.3 - 1.2 mg/dL   GFR, Estimated 17 (L) >60 mL/min   Anion gap 8 5 - 15  Lipase, blood  Result Value Ref Range   Lipase 101 (H) 11 - 51 U/L  Urinalysis, Routine w reflex microscopic Urine, Clean Catch  Result Value Ref Range   Color, Urine YELLOW YELLOW   APPearance CLOUDY (A) CLEAR   Specific Gravity, Urine 1.019 1.005 - 1.030   pH 5.0 5.0 - 8.0   Glucose, UA NEGATIVE NEGATIVE mg/dL    Hgb urine dipstick NEGATIVE NEGATIVE   Bilirubin Urine NEGATIVE NEGATIVE   Ketones, ur 20 (A) NEGATIVE mg/dL   Protein, ur >=300 (A) NEGATIVE mg/dL   Nitrite NEGATIVE NEGATIVE   Leukocytes,Ua LARGE (A) NEGATIVE   RBC / HPF 11-20 0 - 5 RBC/hpf   WBC, UA >50 (H) 0 - 5 WBC/hpf   Bacteria, UA FEW (A) NONE SEEN   Squamous Epithelial / LPF 0-5 0 - 5   Mucus PRESENT    Hyaline Casts, UA PRESENT   Rapid urine drug screen (hospital performed)  Result Value Ref Range   Opiates NONE DETECTED NONE DETECTED   Cocaine NONE DETECTED NONE DETECTED   Benzodiazepines POSITIVE (A) NONE DETECTED   Amphetamines NONE DETECTED NONE DETECTED   Tetrahydrocannabinol NONE DETECTED NONE DETECTED   Barbiturates NONE DETECTED NONE DETECTED   CT Renal Stone Study  Result Date: 12/04/2021 CLINICAL DATA:  Flank pain, kidney stone suspected. Diarrhea, abdominal cramping EXAM: CT ABDOMEN AND PELVIS WITHOUT CONTRAST TECHNIQUE: Multidetector CT imaging of the abdomen and pelvis was performed following the standard protocol without IV contrast. RADIATION DOSE REDUCTION: This exam was performed according to the departmental dose-optimization program which includes automated exposure control, adjustment of the mA and/or kV according to patient size and/or use of iterative reconstruction technique. COMPARISON:  03/04/2007 FINDINGS: Lower chest: Visualized lung bases are clear. Extensive right coronary artery calcification. Global cardiac size within normal limits. No pericardial effusion. Bilateral breast implants noted. Hepatobiliary: No focal liver abnormality is seen. No gallstones, gallbladder wall thickening, or biliary dilatation. Pancreas: Unremarkable Spleen: Unremarkable Adrenals/Urinary Tract: The adrenal glands are unremarkable. The kidneys are normal in size and position. A few scattered cortical calcifications are seen within the right kidney which may be post inflammatory or posttraumatic in nature. No urinary renal or  ureteral calculi are identified. There is no hydronephrosis. 18 mm simple cyst is seen arising exophytically from the lateral aspect the left kidney. 22 mm hyperdense cyst measures 23 Hounsfield units and arises from the upper pole the left kidney, slightly enlarged since prior examination. This was better assessed on prior MRI examination of 05/11/2007 and is compatible with a hemorrhagic cyst. No follow-up imaging is recommended for these lesions. The bladder is unremarkable. Stomach/Bowel: Stomach is within normal limits. Appendix appears normal. No evidence of bowel wall thickening, distention, or inflammatory changes. Vascular/Lymphatic: Extensive aortoiliac atherosclerotic calcification. 3.1 cm fusiform infrarenal abdominal aortic aneurysm is  present. No pathologic adenopathy within the abdomen and pelvis. Reproductive: Uterus and bilateral adnexa are unremarkable. Other: No abdominal wall hernia Musculoskeletal: L4-5 posterior decompression and fusion with instrumentation has been performed. Degenerative changes are seen within the lumbar spine, most severe at L5-S1. Dorsal column stimulator device is seen within the subcutaneous soft tissues of the left lumbar region with leads entering the spinal canal at T12-L1 extending cranially beyond the margin of the examination. No acute bone abnormality. No lytic or blastic bone lesion. IMPRESSION: 1. No acute intra-abdominal pathology identified. No definite radiographic explanation for the patient's reported symptoms. 2. Extensive right coronary artery calcification. 3. 3.1 cm infrarenal abdominal aortic aneurysm. Recommend follow-up ultrasound every 3 years. This recommendation follows ACR consensus guidelines: White Paper of the ACR Incidental Findings Committee II on Vascular Findings. J Am Coll Radiol 2013; 10:789-794. Aortic Atherosclerosis (ICD10-I70.0). Electronically Signed   By: Fidela Salisbury M.D.   On: 12/04/2021 00:24     Radiology CT Renal Stone  Study  Result Date: 12/04/2021 CLINICAL DATA:  Flank pain, kidney stone suspected. Diarrhea, abdominal cramping EXAM: CT ABDOMEN AND PELVIS WITHOUT CONTRAST TECHNIQUE: Multidetector CT imaging of the abdomen and pelvis was performed following the standard protocol without IV contrast. RADIATION DOSE REDUCTION: This exam was performed according to the departmental dose-optimization program which includes automated exposure control, adjustment of the mA and/or kV according to patient size and/or use of iterative reconstruction technique. COMPARISON:  03/04/2007 FINDINGS: Lower chest: Visualized lung bases are clear. Extensive right coronary artery calcification. Global cardiac size within normal limits. No pericardial effusion. Bilateral breast implants noted. Hepatobiliary: No focal liver abnormality is seen. No gallstones, gallbladder wall thickening, or biliary dilatation. Pancreas: Unremarkable Spleen: Unremarkable Adrenals/Urinary Tract: The adrenal glands are unremarkable. The kidneys are normal in size and position. A few scattered cortical calcifications are seen within the right kidney which may be post inflammatory or posttraumatic in nature. No urinary renal or ureteral calculi are identified. There is no hydronephrosis. 18 mm simple cyst is seen arising exophytically from the lateral aspect the left kidney. 22 mm hyperdense cyst measures 23 Hounsfield units and arises from the upper pole the left kidney, slightly enlarged since prior examination. This was better assessed on prior MRI examination of 05/11/2007 and is compatible with a hemorrhagic cyst. No follow-up imaging is recommended for these lesions. The bladder is unremarkable. Stomach/Bowel: Stomach is within normal limits. Appendix appears normal. No evidence of bowel wall thickening, distention, or inflammatory changes. Vascular/Lymphatic: Extensive aortoiliac atherosclerotic calcification. 3.1 cm fusiform infrarenal abdominal aortic aneurysm is  present. No pathologic adenopathy within the abdomen and pelvis. Reproductive: Uterus and bilateral adnexa are unremarkable. Other: No abdominal wall hernia Musculoskeletal: L4-5 posterior decompression and fusion with instrumentation has been performed. Degenerative changes are seen within the lumbar spine, most severe at L5-S1. Dorsal column stimulator device is seen within the subcutaneous soft tissues of the left lumbar region with leads entering the spinal canal at T12-L1 extending cranially beyond the margin of the examination. No acute bone abnormality. No lytic or blastic bone lesion. IMPRESSION: 1. No acute intra-abdominal pathology identified. No definite radiographic explanation for the patient's reported symptoms. 2. Extensive right coronary artery calcification. 3. 3.1 cm infrarenal abdominal aortic aneurysm. Recommend follow-up ultrasound every 3 years. This recommendation follows ACR consensus guidelines: White Paper of the ACR Incidental Findings Committee II on Vascular Findings. J Am Coll Radiol 2013; 10:789-794. Aortic Atherosclerosis (ICD10-I70.0). Electronically Signed   By: Fidela Salisbury M.D.   On:  12/04/2021 00:24    Procedures Procedures    Medications Ordered in ED Medications  0.9 %  sodium chloride infusion ( Intravenous New Bag/Given 12/03/21 2335)  potassium chloride 10 mEq in 100 mL IVPB (has no administration in time range)  sodium chloride 0.9 % bolus 500 mL (500 mLs Intravenous New Bag/Given 12/03/21 2335)    ED Course/ Medical Decision Making/ A&P                           Medical Decision Making Patient with 1 week of diarrhea, seen yesterday at [facility] for same.    Problems Addressed: AKI (acute kidney injury) ([place]): acute illness or injury    Details: IV fluids  Dehydration: acute illness or injury    Details: IV fluids  Hypokalemia: acute illness or injury    Details: IV potassium   Amount and/or Complexity of Data Reviewed Independent Historian:  spouse    Details: See above  External Data Reviewed: labs and notes.    Details: Previous notes and labs from  yesterday reviewed  Labs: ordered.    Details: All labs reviewed:  Normal white count, low hemoglobin 11.2, normal platelet count.  Urine with UTI and proteinuria.  Normal sodium, potassium low 2.8, normal BUN creatinine elevated 2.91. Normal LLFTs, lipase elevated 101 Radiology: ordered and independent interpretation performed.    Details: No acute GI finding by me on CT  Risk Prescription drug management. Decision regarding hospitalization. Risk Details: The patient appears reasonably stabilized for admission considering the current resources, flow, and capabilities available in the ED at this time, and I doubt any other [facility] requiring further screening and/or treatment in the ED prior to admission.     Final Clinical Impression(s) / ED Diagnoses Final diagnoses:  Dehydration  Diarrhea, unspecified type  Hypokalemia  AKI (acute kidney injury) [facility])    Rx / DC Orders ED Discharge Orders     None         Caldwell Kronenberger, MD 12/04/21 5053

## 2021-12-04 NOTE — ED Notes (Signed)
ED TO INPATIENT HANDOFF REPORT  Name/Age/Gender Connie Bennett 71 y.o. female  Code Status Code Status History     Date Active Date Inactive Code Status Order ID Comments User Context   07/20/2021 0055 07/28/2021 1710 Full Code [id number]  Vernelle Emerald, MD Inpatient   07/07/2021 0130 07/09/2021 1613 Full Code [id number]  Rise Patience, MD ED   09/13/2014 1029 09/14/2014 1154 Full Code [id number]  [name], MD Inpatient       Home/SNF/Other Home  Chief Complaint Acute renal failure superimposed on stage 3a chronic kidney disease ([place]) [N17.9, N18.31]  Level of Care/Admitting Diagnosis ED Disposition     ED Disposition  Admit   Condition  --   Comment  Hospital Area: [facility] [[id number]]  Level of Care: Med-Surg [16]  May admit patient to [facility] or [facility] if equivalent level of care is available:: Yes  Covid Evaluation: Asymptomatic - no recent exposure (last 10 days) testing not required  Diagnosis: Acute renal failure superimposed on stage 3a chronic kidney disease [facility]) [[id number]]  Admitting Physician: Vianne Bulls [[id number]]  Attending Physician: Vianne Bulls [[id number]]  Certification:: I certify this patient will need inpatient services for at least 2 midnights  Estimated Length of Stay: 3          Medical History Past Medical History:  Diagnosis Date   Anxiety    takes Xanax daily as needed   Arthritis    Bilateral lower extremity edema    takes Furosemide daily as needed   Breast cancer [facility]) oncologist-  dr Truitt Merle--  right upper-outer quadrant    dx May 2016--- Stage 0  (Tis,N0,M0)  DCIS  Right breast---  09-13-2014  s/p  radioactive seed/ partial mastectomy / sln bx   Chronic fatigue syndrome    Chronic low back pain    Complication of anesthesia    spinal cord stimulator- to be off for surgery   Depression    takes Effexor daily   Family history of adverse reaction to anesthesia    mom hard to wake up    Fibromyalgia    Headache(784.0)    couple of times a week   Heart murmur    History of bronchitis > 69yr ago   History of colon polyps    benign   History of drug overdose    oxycontin  and oxycodone  Sept 2015--  now has narcan injection prescription   History of DVT of lower extremity    2008--  BILATERAL   History of hiatal hernia    HTN (hypertension)    takes Amlodipine and Ramipril daily   Hyperlipidemia    takes Zetia and Crestor  daily   Joint pain    Mitral valve prolapse    MILD /   PER PT ASYMPTOMATIC   Muscle spasm    takes Zanaflex daily as needed   Osteoporosis    Peripheral neuropathy    takes Neurontin daily   Peripheral vascular disease (HCC)    hx dvt's   RSD (reflex sympathetic dystrophy)    left wrist and forearm from a fx   RSD (reflex sympathetic dystrophy)    Sjogren's disease (HEpping    Vertigo    takes Meclizine daily     Allergies Allergies  Allergen Reactions   Carbocaine [Mepivacaine Hcl] Hives and Swelling   Penicillins Nausea And Vomiting, Swelling, Rash and Other (See Comments)    Has patient had a PCN  reaction causing immediate rash, facial/tongue/throat swelling, SOB or lightheadedness with hypotension: Yes Has patient had a PCN reaction causing severe rash involving mucus membranes or skin necrosis: No Has patient had a PCN reaction that required hospitalization No Has patient had a PCN reaction occurring within the last 10 years: Yes If all of the above answers are "NO", then may proceed with Cephalosporin use.   Sulfa Antibiotics Nausea And Vomiting, Swelling, Rash and Other (See Comments)    Headache    Latex Rash and Other (See Comments)    Reaction to gloves and bandaids (paper tape is ok)    IV Location/Drains/Wounds Patient Lines/Drains/Airways Status     Active Line/Drains/Airways     Name Placement date Placement time Site Days   Peripheral IV 12/03/21 20 G 1" Right Antecubital 12/03/21  2328  Antecubital  1             Labs/Imaging Results for orders placed or performed during the hospital encounter of 12/03/21 (from the past 48 hour(s))  Urinalysis, Routine w reflex microscopic Urine, Clean Catch     Status: Abnormal   Collection Time: 12/03/21  8:55 PM  Result Value Ref Range   Color, Urine YELLOW YELLOW   APPearance CLOUDY (A) CLEAR   Specific Gravity, Urine 1.019 1.005 - 1.030   pH 5.0 5.0 - 8.0   Glucose, UA NEGATIVE NEGATIVE mg/dL   Hgb urine dipstick NEGATIVE NEGATIVE   Bilirubin Urine NEGATIVE NEGATIVE   Ketones, ur 20 (A) NEGATIVE mg/dL   Protein, ur >=300 (A) NEGATIVE mg/dL   Nitrite NEGATIVE NEGATIVE   Leukocytes,Ua LARGE (A) NEGATIVE   RBC / HPF 11-20 0 - 5 RBC/hpf   WBC, UA >50 (H) 0 - 5 WBC/hpf   Bacteria, UA FEW (A) NONE SEEN   Squamous Epithelial / LPF 0-5 0 - 5   Mucus PRESENT    Hyaline Casts, UA PRESENT     Comment: Performed at [facility], [place] [address], [place], [place] [address]  CBC with Differential     Status: Abnormal   Collection Time: 12/03/21  9:06 PM  Result Value Ref Range   WBC 6.9 4.0 - 10.5 K/uL   RBC 3.63 (L) 3.87 - 5.11 MIL/uL   Hemoglobin 11.2 (L) 12.0 - 15.0 g/dL   HCT 34.5 (L) 36.0 - 46.0 %   MCV 95.0 80.0 - 100.0 fL   MCH 30.9 26.0 - 34.0 pg   MCHC 32.5 30.0 - 36.0 g/dL   RDW 14.1 11.5 - 15.5 %   Platelets 251 150 - 400 K/uL   nRBC 0.0 0.0 - 0.2 %   Neutrophils Relative % 67 %   Neutro Abs 4.6 1.7 - 7.7 K/uL   Lymphocytes Relative 24 %   Lymphs Abs 1.6 0.7 - 4.0 K/uL   Monocytes Relative 8 %   Monocytes Absolute 0.5 0.1 - 1.0 K/uL   Eosinophils Relative 1 %   Eosinophils Absolute 0.1 0.0 - 0.5 K/uL   Basophils Relative 0 %   Basophils Absolute 0.0 0.0 - 0.1 K/uL   Immature Granulocytes 0 %   Abs Immature Granulocytes 0.02 0.00 - 0.07 K/uL    Comment: Performed at [facility], [place] [address] [place],  [address]  Comprehensive metabolic panel     Status: Abnormal   Collection Time:  12/03/21  9:06 PM  Result Value Ref Range   Sodium 145 135 - 145 mmol/L   Potassium 2.8 (L) 3.5 - 5.1  mmol/L   Chloride 115 (H) 98 - 111 mmol/L   CO2 22 22 - 32 mmol/L   Glucose, Bld 75 70 - 99 mg/dL    Comment: Glucose reference range applies only to samples taken after fasting for at least 8 hours.   BUN 18 8 - 23 mg/dL   Creatinine, Ser 2.91 (H) 0.44 - 1.00 mg/dL   Calcium 8.9 8.9 - 10.3 mg/dL   Total Protein 7.3 6.5 - 8.1 g/dL   Albumin 3.6 3.5 - 5.0 g/dL   AST 32 15 - 41 U/L   ALT 21 0 - 44 U/L   Alkaline Phosphatase 64 38 - 126 U/L   Total Bilirubin 0.6 0.3 - 1.2 mg/dL   GFR, Estimated 17 (L) >60 mL/min    Comment: (NOTE) Calculated using the CKD-EPI Creatinine Equation (2021)    Anion gap 8 5 - 15    Comment: Performed at [facility], [place] [address], [place], [place] [address]  Lipase, blood     Status: Abnormal   Collection Time: 12/03/21  9:06 PM  Result Value Ref Range   Lipase 101 (H) 11 - 51 U/L    Comment: Performed at [facility], [place] [address] [place], [place] [address]  Rapid urine drug screen (hospital performed)     Status: Abnormal   Collection Time: 12/03/21 11:19 PM  Result Value Ref Range   Opiates NONE DETECTED NONE DETECTED   Cocaine NONE DETECTED NONE DETECTED   Benzodiazepines POSITIVE (A) NONE DETECTED   Amphetamines NONE DETECTED NONE DETECTED   Tetrahydrocannabinol NONE DETECTED NONE DETECTED   Barbiturates NONE DETECTED NONE DETECTED    Comment: (NOTE) DRUG SCREEN FOR MEDICAL PURPOSES ONLY.  IF CONFIRMATION IS NEEDED FOR ANY PURPOSE, NOTIFY LAB WITHIN 5 DAYS.  LOWEST DETECTABLE LIMITS FOR URINE DRUG SCREEN Drug Class                     Cutoff (ng/mL) Amphetamine and metabolites    1000 Barbiturate and metabolites    200 Benzodiazepine                 364 Tricyclics and metabolites     300 Opiates and metabolites        300 Cocaine and metabolites        300 THC                             50 Performed at [facility], [place] [address], [place], [place] [address]    CT Renal Stone Study  Result Date: 12/04/2021 CLINICAL DATA:  Flank pain, kidney stone suspected. Diarrhea, abdominal cramping EXAM: CT ABDOMEN AND PELVIS WITHOUT CONTRAST TECHNIQUE: Multidetector CT imaging of the abdomen and pelvis was performed following the standard protocol without IV contrast. RADIATION DOSE REDUCTION: This exam was performed according to the departmental dose-optimization program which includes automated exposure control, adjustment of the mA and/or kV according to patient size and/or use of iterative reconstruction technique. COMPARISON:  03/04/2007 FINDINGS: Lower chest: Visualized lung bases are clear. Extensive right coronary artery calcification. Global cardiac size within normal limits. No pericardial effusion. Bilateral breast implants noted. Hepatobiliary: No focal liver abnormality is seen. No gallstones, gallbladder wall thickening, or biliary dilatation. Pancreas: Unremarkable Spleen: Unremarkable Adrenals/Urinary Tract: The adrenal glands are unremarkable. The kidneys are normal in size and position. A few scattered cortical calcifications are seen within the right kidney  which may be post inflammatory or posttraumatic in nature. No urinary renal or ureteral calculi are identified. There is no hydronephrosis. 18 mm simple cyst is seen arising exophytically from the lateral aspect the left kidney. 22 mm hyperdense cyst measures 23 Hounsfield units and arises from the upper pole the left kidney, slightly enlarged since prior examination. This was better assessed on prior MRI examination of 05/11/2007 and is compatible with a hemorrhagic cyst. No follow-up imaging is recommended for these lesions. The bladder is unremarkable. Stomach/Bowel: Stomach is within normal limits. Appendix appears normal. No evidence of bowel wall thickening, distention, or inflammatory changes.  Vascular/Lymphatic: Extensive aortoiliac atherosclerotic calcification. 3.1 cm fusiform infrarenal abdominal aortic aneurysm is present. No pathologic adenopathy within the abdomen and pelvis. Reproductive: Uterus and bilateral adnexa are unremarkable. Other: No abdominal wall hernia Musculoskeletal: L4-5 posterior decompression and fusion with instrumentation has been performed. Degenerative changes are seen within the lumbar spine, most severe at L5-S1. Dorsal column stimulator device is seen within the subcutaneous soft tissues of the left lumbar region with leads entering the spinal canal at T12-L1 extending cranially beyond the margin of the examination. No acute bone abnormality. No lytic or blastic bone lesion. IMPRESSION: 1. No acute intra-abdominal pathology identified. No definite radiographic explanation for the patient's reported symptoms. 2. Extensive right coronary artery calcification. 3. 3.1 cm infrarenal abdominal aortic aneurysm. Recommend follow-up ultrasound every 3 years. This recommendation follows ACR consensus guidelines: White Paper of the ACR Incidental Findings Committee II on Vascular Findings. J Am Coll Radiol 2013; 10:789-794. Aortic Atherosclerosis (ICD10-I70.0). Electronically Signed   By: Fidela Salisbury M.D.   On: 12/04/2021 00:24    Pending Labs Unresulted Labs (From admission, onward)     Start     Ordered   12/03/21 2330  Gastrointestinal Panel by PCR , Stool  (Gastrointestinal Panel by PCR, Stool                                                                                                                                                     **Does Not include CLOSTRIDIUM DIFFICILE testing. **If CDIFF testing is needed, place order from the "C Difficile Testing" order set.**)  Once,   URGENT       Question Answer Comment  Patient immune status Normal   Release to patient Immediate      12/03/21 2329            Vitals/Pain Today's Vitals   12/04/21 0000  12/04/21 0030 12/04/21 0200 12/04/21 0518  BP: 127/66 (!) 143/83 (!) 154/74 (!) 161/80  Pulse: 72 75 83 80  Resp: '17 17 18 18  '$ Temp:  98.4 F (36.9 C)  98.1 F (36.7 C)  TempSrc:    Oral  SpO2: 100% 100% 100% 100%  Weight:      Height:  PainSc:        Isolation Precautions Enteric precautions (UV disinfection)  Medications Medications  0.9 %  sodium chloride infusion ( Intravenous New Bag/Given 12/03/21 2335)  0.9 %  sodium chloride infusion ( Intravenous Not Given 12/04/21 0212)  acetaminophen (TYLENOL) tablet 650 mg (has no administration in time range)  oxyCODONE-acetaminophen (PERCOCET/ROXICET) 5-325 MG per tablet 1 tablet (1 tablet Oral Given 12/04/21 0619)    And  oxyCODONE (Oxy IR/ROXICODONE) immediate release tablet 5 mg (5 mg Oral Given 12/04/21 0619)  sodium chloride 0.9 % bolus 500 mL (0 mLs Intravenous Stopped 12/04/21 0218)  potassium chloride 10 mEq in 100 mL IVPB (0 mEq Intravenous Stopped 12/04/21 0642)  ondansetron (ZOFRAN) injection 4 mg (4 mg Intravenous Given 12/04/21 0212)  fosfomycin (MONUROL) packet 3 g (3 g Oral Given 12/04/21 0223)    Mobility walks

## 2021-12-04 NOTE — Plan of Care (Signed)
  Problem: Education: Goal: Knowledge of General Education information will improve Description Including pain rating scale, medication(s)/side effects and non-pharmacologic comfort measures Outcome: Progressing   

## 2021-12-04 NOTE — ED Notes (Signed)
Pt provided bedside commode for bathroom use. Pt expressed her diarrhea episodes are spontaneous and that she will use the bedside commode when needed.

## 2021-12-04 NOTE — Progress Notes (Signed)
Transition of Care [facility]) Screening Note  Patient Details  Name: Connie Bennett Date of Birth: 1950-04-19  Transition of Care [facility]) CM/SW Contact:    Sherie Don, LCSW Phone Number: 12/04/2021, 11:46 AM  Transition of Care Department [facility]) has reviewed patient and no TOC needs have been identified at this time. We will continue to monitor patient advancement through interdisciplinary progression rounds. If new patient transition needs arise, please place a TOC consult.

## 2021-12-05 ENCOUNTER — Inpatient Hospital Stay (HOSPITAL_COMMUNITY): Payer: PPO

## 2021-12-05 DIAGNOSIS — I2510 Atherosclerotic heart disease of native coronary artery without angina pectoris: Secondary | ICD-10-CM | POA: Diagnosis not present

## 2021-12-05 DIAGNOSIS — N1831 Chronic kidney disease, stage 3a: Secondary | ICD-10-CM | POA: Diagnosis not present

## 2021-12-05 DIAGNOSIS — N179 Acute kidney failure, unspecified: Secondary | ICD-10-CM | POA: Diagnosis not present

## 2021-12-05 LAB — GASTROINTESTINAL PANEL BY PCR, STOOL (REPLACES STOOL CULTURE)

## 2021-12-05 LAB — MAGNESIUM: Magnesium: 1.8 mg/dL (ref 1.7–2.4)

## 2021-12-05 LAB — COMPREHENSIVE METABOLIC PANEL
ALT: 19 U/L (ref 0–44)
AST: 26 U/L (ref 15–41)
Albumin: 3 g/dL — ABNORMAL LOW (ref 3.5–5.0)
Alkaline Phosphatase: 53 U/L (ref 38–126)
Anion gap: 5 (ref 5–15)
BUN: 11 mg/dL (ref 8–23)
CO2: 20 mmol/L — ABNORMAL LOW (ref 22–32)
Calcium: 8 mg/dL — ABNORMAL LOW (ref 8.9–10.3)
Chloride: 122 mmol/L — ABNORMAL HIGH (ref 98–111)
Creatinine, Ser: 2.07 mg/dL — ABNORMAL HIGH (ref 0.44–1.00)
GFR, Estimated: 25 mL/min — ABNORMAL LOW (ref >60)
Glucose, Bld: 69 mg/dL — ABNORMAL LOW (ref 70–99)
Potassium: 2.7 mmol/L — CL (ref 3.5–5.1)
Sodium: 147 mmol/L — ABNORMAL HIGH (ref 135–145)
Total Bilirubin: 0.4 mg/dL (ref 0.3–1.2)
Total Protein: 5.7 g/dL — ABNORMAL LOW (ref 6.5–8.1)

## 2021-12-05 LAB — CBC
HCT: 32.7 % — ABNORMAL LOW (ref 36.0–46.0)
Hemoglobin: 10.7 g/dL — ABNORMAL LOW (ref 12.0–15.0)
MCH: 30.8 pg (ref 26.0–34.0)
MCHC: 32.7 g/dL (ref 30.0–36.0)
MCV: 94.2 fL (ref 80.0–100.0)
Platelets: 215 10*3/uL (ref 150–400)
RBC: 3.47 MIL/uL — ABNORMAL LOW (ref 3.87–5.11)
RDW: 14.3 % (ref 11.5–15.5)
WBC: 6.7 10*3/uL (ref 4.0–10.5)
nRBC: 0 % (ref 0.0–0.2)

## 2021-12-05 LAB — ECHOCARDIOGRAM COMPLETE
AR max vel: 1.71 cm2
AV Peak grad: 16.2 mmHg
Ao pk vel: 2.01 m/s
Area-P 1/2: 5.06 cm2
Height: 62 in
P 1/2 time: 398 msec
S' Lateral: 3 cm
Weight: 1760 oz

## 2021-12-05 LAB — POTASSIUM: Potassium: 3.1 mmol/L — ABNORMAL LOW (ref 3.5–5.1)

## 2021-12-05 MED ORDER — ADULT MULTIVITAMIN W/MINERALS CH
1.0000 | ORAL_TABLET | Freq: Every day | ORAL | Status: DC
  Administered 2021-12-06 – 2021-12-07 (×2): 1 via ORAL
  Filled 2021-12-05 (×2): qty 1

## 2021-12-05 MED ORDER — POTASSIUM CHLORIDE CRYS ER 20 MEQ PO TBCR
40.0000 meq | EXTENDED_RELEASE_TABLET | ORAL | Status: AC
  Administered 2021-12-05 (×2): 40 meq via ORAL
  Filled 2021-12-05: qty 2

## 2021-12-05 MED ORDER — SODIUM CHLORIDE 0.45 % IV SOLN: INTRAVENOUS | Status: DC

## 2021-12-05 MED ORDER — POTASSIUM CHLORIDE 20 MEQ PO PACK
60.0000 meq | PACK | Freq: Once | ORAL | Status: AC
Start: 2021-12-05 — End: 2021-12-05
  Administered 2021-12-05: 60 meq via ORAL
  Filled 2021-12-05: qty 3

## 2021-12-05 MED ORDER — BOOST PLUS PO LIQD
237.0000 mL | Freq: Three times a day (TID) | ORAL | Status: DC
  Administered 2021-12-05 – 2021-12-07 (×5): 237 mL via ORAL
  Filled 2021-12-05 (×6): qty 237

## 2021-12-05 MED ORDER — DEXTROSE 5 % IV SOLN: INTRAVENOUS | Status: DC

## 2021-12-05 MED ORDER — MAGNESIUM SULFATE 2 GM/50ML IV SOLN
2.0000 g | Freq: Once | INTRAVENOUS | Status: AC
  Administered 2021-12-05: 2 g via INTRAVENOUS
  Filled 2021-12-05: qty 50

## 2021-12-05 NOTE — Progress Notes (Signed)
Initial Nutrition Assessment  DOCUMENTATION CODES:   Not applicable  INTERVENTION:   Boost Plus TID- Each supplement provides 360kcal and 14g protein.    MVI po daily   Pt at high refeed risk; recommend monitor potassium, magnesium and phosphorus labs daily until stable  NUTRITION DIAGNOSIS:   Inadequate oral intake related to acute illness as evidenced by per patient/family report.  GOAL:   Patient will meet greater than or equal to 90% of their needs  MONITOR:   PO intake, Supplement acceptance, Labs, Weight trends, Skin, I & O's  REASON FOR ASSESSMENT:   Malnutrition Screening Tool    ASSESSMENT:   71 y/o female with h/o CKD III, HTN, breast cancer s/p radioactive seed & partial mastectomy, MDD, anxiety, AAA, CAD, DVT, Sjogrens, hiatal hernia and recent C-diff who is admitted with recurrent C-diff with diarrhea x 1 week, UTI and AKI.  RD working remotely.  Spoke with pt via phone. Pt reports poor appetite and oral intake for the past week r/t nausea and diarrhea. Pt reports that her oral intake is improved today in hospital and that this if the first day she has been able to eat. Pt documented to have eaten 100% of her dinner last night and reports that she loves the food. Pt reports that she has been drinking chocolate Boost at home and is taking a daily MVI. RD discussed with pt the importance of adequate nutrition needed to preserve lean muscle. RD will add supplements and MVI to help pt meet her estimated needs. Pt is at high refeed risk. Per chart, pt is down 16lbs(13%) over the past 6 months; this is significant weight loss. Pt reports that she has lost more weight than this overall. RD will attempt to obtain nutrition related exam at follow-up. Pt previously diagnosed with moderate malnutrition during an admission in April.   Medications reviewed and include: vitamin C, aspirin, B-complex with C, D3, dificid, nicotine   Labs reviewed: Na 147(H), K 2.7(L), creat  2.07(H), Mg 1.8 wnl Hgb 10.7(L), Hct 32.7(L)  NUTRITION - FOCUSED PHYSICAL EXAM: Unable to perform at this time   Diet Order:   Diet Order             DIET SOFT Room service appropriate? Yes; Fluid consistency: Thin  Diet effective now                  EDUCATION NEEDS:   No education needs have been identified at this time  Skin:  Skin Assessment: Reviewed RN Assessment  Last BM:  9/7- TYPE 7  Height:   Ht Readings from Last 1 Encounters:  12/03/21 '5\' 2"'$  (1.575 m)    Weight:   Wt Readings from Last 1 Encounters:  12/03/21 49.9 kg    Ideal Body Weight:  50 kg  BMI:  Body mass index is 20.12 kg/m.  Estimated Nutritional Needs:   Kcal:  1400-1600kcal/day  Protein:  70-80g/day  Fluid:  1.3-1.5L/day  Koleen Distance MS, RD, LDN Please refer to [facility] for RD and/or RD on-call/weekend/after hours pager

## 2021-12-05 NOTE — Progress Notes (Addendum)
PROGRESS NOTE  Connie Bennett  ZDG:[id number] DOB: 12-07-1950 DOA: 12/03/2021 PCP: Mayra Neer, MD   Brief Narrative: Patient is a 71 year old female with history of anxiety, bilateral lower extremity edema, chronic fatigue syndrome, fibromyalgia, depression, DVT, hypertension, hyperlipidemia, PVD, Sjogren's syndrome who presented to the emergency department from home with complaints of diarrhea, dehydration, lightheadedness.  Patient was recently diagnosed with recurrent C. difficile colitis and is being treated with second round of Dificid by her PCP.  She reported of decreased appetite.  On presentation she was hemodynamically stable.  Lab work showed low potassium, creatinine of 2.9.  Urine analysis was positive for possible UTI with leukocytes, bacteria so she was given a dose of fosfomycin.  CT renal study did not show any acute intra-abdominal finding.  Assessment & Plan:  Principal Problem:   Acute renal failure superimposed on stage 3a chronic kidney disease (HCC) Active Problems:   Essential hypertension   Mixed hyperlipidemia   Hypokalemia   Major depressive disorder   Breast cancer of upper-inner quadrant of left female breast ([place])   Anemia of chronic disease   Abdominal aortic aneurysm (AAA) 3.0 cm to 5.0 cm in diameter in female [facility])   Aortic atherosclerosis (HCC)   Recurrent Clostridioides difficile diarrhea   Asymptomatic bacteriuria   CAD (coronary artery disease)   Tobacco use   AKI on CKD stage IIIa: Baseline creatinine ranges from 1.3-1.4.  Presented with creatinine Of 2.9.  Kidney function improved with IV fluid.   Continue fluid  Hypernatremia/hypokalemia: Hypokalemia being aggressively managed with supplementation.  Mg 1.8.  Continue gentle hypotonic fluid for hypernatremia  Recurrent C. difficile diarrhea: Currently on second round of Dificid.  Continue. .  Afebrile, no leukocytosis.  Diarrhea is slowing down, she denies any abdominal pain.  C. difficile  panel not sent here, GI pathogen panel came out to be negative.  Suspicion for UTI: Urinalysis showed leukocytes, bacteria.  She was given a dose of fosfomycin.  She denies any abdominal pain or dysuria.  Hypertension: On Lasix, ramipril at home.  Currently on hold  Depression: On Wellbutrin  History of left breast cancer: On Arimidex.  We recommend outpatient follow-up with oncology  Anemia of chronic disease: Continue monitoring, hemoglobin stable  History of coronary artery disease: On aspirin, statin, no anginal symptoms  Abdominal aortic aneurysm:There is a 3.1 cm infrarenal AAA with recommendation for ultrasound follow-up every 3 years  Hyperlipidemia: Continue Lipitor  Smoker: Smoking cessation advised.  Continue nicotine patch      DVT prophylaxis:SCDs Start: 12/04/21 5956     Code Status: Full Code  Family Communication: Called and discussed with husband on phone on 9/7  Patient status:Inpatient  Patient is from :Home  Anticipated discharge LO:VFIE  Estimated DC date:1-2 days   Consultants: None  Procedures:None  Antimicrobials:  Anti-infectives (From admission, onward)    Start     Dose/Rate Route Frequency Ordered Stop   12/04/21 1000  fidaxomicin (DIFICID) tablet 200 mg        200 mg Oral 2 times daily 12/04/21 0807 12/07/21 2359   12/04/21 0200  fosfomycin (MONUROL) packet 3 g        3 g Oral  Once 12/04/21 0149 12/04/21 0223       Subjective: Patient seen and examined at the bedside this morning.  She looks very comfortable, in stable.Her diarrhea is slowing down,denies any abd pain.No nausea or voming  Objective: Vitals:   12/04/21 1925 12/04/21 2158 12/05/21 0137 12/05/21 0501  BP: Marland Kitchen)  166/72 (!) 165/80 (!) 157/76 (!) 167/77  Pulse: 86 80 69 71  Resp: '17 17 17 17  '$ Temp: 98.1 F (36.7 C) 98.3 F (36.8 C) 98.4 F (36.9 C) 98.3 F (36.8 C)  TempSrc: Oral Oral Oral Oral  SpO2: 100% 100% 98% 99%  Weight:      Height:         Intake/Output Summary (Last 24 hours) at 12/05/2021 0759 Last data filed at 12/05/2021 0434 Gross per 24 hour  Intake 2914.88 ml  Output --  Net 2914.88 ml   Filed Weights   12/03/21 2035  Weight: 49.9 kg    Examination:  General exam: Overall comfortable, not in distress, pleasant female HEENT: PERRL Respiratory system:  no wheezes or crackles  Cardiovascular system: S1 & S2 heard, RRR.  Gastrointestinal system: Abdomen is nondistended, soft and nontender. Central nervous system: Alert and oriented Extremities: No edema, no clubbing ,no cyanosis Skin: No rashes, no ulcers,no icterus     Data Reviewed: I have personally reviewed following labs and imaging studies  CBC: Recent Labs  Lab 12/02/21 0547 12/03/21 2106 12/04/21 0839 12/05/21 0416  WBC 7.4 6.9 8.2 6.7  NEUTROABS 4.9 4.6  --   --   HGB 10.8* 11.2* 11.1* 10.7*  HCT 34.0* 34.5* 34.3* 32.7*  MCV 93.9 95.0 95.8 94.2  PLT 247 251 246 409   Basic Metabolic Panel: Recent Labs  Lab 12/02/21 0547 12/03/21 2106 12/04/21 0839 12/05/21 0416  NA 143 145 145 147*  K 3.1* 2.8* 3.1* 2.7*  CL 111 115* 118* 122*  CO2 22 22 20* 20*  GLUCOSE 102* 75 56* 69*  BUN '18 18 16 11  '$ CREATININE 2.97* 2.91* 2.59* 2.07*  CALCIUM 9.1 8.9 8.5* 8.0*     No results found for this or any previous visit (from the past 240 hour(s)).   Radiology Studies: CT Renal Stone Study  Result Date: 12/04/2021 CLINICAL DATA:  Flank pain, kidney stone suspected. Diarrhea, abdominal cramping EXAM: CT ABDOMEN AND PELVIS WITHOUT CONTRAST TECHNIQUE: Multidetector CT imaging of the abdomen and pelvis was performed following the standard protocol without IV contrast. RADIATION DOSE REDUCTION: This exam was performed according to the departmental dose-optimization program which includes automated exposure control, adjustment of the mA and/or kV according to patient size and/or use of iterative reconstruction technique. COMPARISON:  03/04/2007  FINDINGS: Lower chest: Visualized lung bases are clear. Extensive right coronary artery calcification. Global cardiac size within normal limits. No pericardial effusion. Bilateral breast implants noted. Hepatobiliary: No focal liver abnormality is seen. No gallstones, gallbladder wall thickening, or biliary dilatation. Pancreas: Unremarkable Spleen: Unremarkable Adrenals/Urinary Tract: The adrenal glands are unremarkable. The kidneys are normal in size and position. A few scattered cortical calcifications are seen within the right kidney which may be post inflammatory or posttraumatic in nature. No urinary renal or ureteral calculi are identified. There is no hydronephrosis. 18 mm simple cyst is seen arising exophytically from the lateral aspect the left kidney. 22 mm hyperdense cyst measures 23 Hounsfield units and arises from the upper pole the left kidney, slightly enlarged since prior examination. This was better assessed on prior MRI examination of 05/11/2007 and is compatible with a hemorrhagic cyst. No follow-up imaging is recommended for these lesions. The bladder is unremarkable. Stomach/Bowel: Stomach is within normal limits. Appendix appears normal. No evidence of bowel wall thickening, distention, or inflammatory changes. Vascular/Lymphatic: Extensive aortoiliac atherosclerotic calcification. 3.1 cm fusiform infrarenal abdominal aortic aneurysm is present. No pathologic adenopathy within the abdomen  and pelvis. Reproductive: Uterus and bilateral adnexa are unremarkable. Other: No abdominal wall hernia Musculoskeletal: L4-5 posterior decompression and fusion with instrumentation has been performed. Degenerative changes are seen within the lumbar spine, most severe at L5-S1. Dorsal column stimulator device is seen within the subcutaneous soft tissues of the left lumbar region with leads entering the spinal canal at T12-L1 extending cranially beyond the margin of the examination. No acute bone abnormality.  No lytic or blastic bone lesion. IMPRESSION: 1. No acute intra-abdominal pathology identified. No definite radiographic explanation for the patient's reported symptoms. 2. Extensive right coronary artery calcification. 3. 3.1 cm infrarenal abdominal aortic aneurysm. Recommend follow-up ultrasound every 3 years. This recommendation follows ACR consensus guidelines: White Paper of the ACR Incidental Findings Committee II on Vascular Findings. J Am Coll Radiol 2013; 10:789-794. Aortic Atherosclerosis (ICD10-I70.0). Electronically Signed   By: Fidela Salisbury M.D.   On: 12/04/2021 00:24    Scheduled Meds:  anastrozole  1 mg Oral QHS   vitamin C  1,000 mg Oral Q lunch   aspirin EC  81 mg Oral Daily   B-complex with vitamin C  1 tablet Oral Daily   brexpiprazole  1 mg Oral q morning   calcium carbonate  1 tablet Oral Q lunch   cholecalciferol  2,000 Units Oral Q lunch   ezetimibe  10 mg Oral QHS   fidaxomicin  200 mg Oral BID   gabapentin  300 mg Oral Daily   nicotine  14 mg Transdermal Daily   rosuvastatin  20 mg Oral QHS   venlafaxine XR  150 mg Oral Q breakfast   Continuous Infusions:  sodium chloride       LOS: 1 day   Shelly Coss, MD [facility] P9/09/2021, 7:59 AM

## 2021-12-05 NOTE — Progress Notes (Signed)
   12/05/21 0501  Vitals  Temp 98.3 F (36.8 C)  Temp Source Oral  BP (!) 167/77  MAP (mmHg) 107  BP Location Left Arm  Patient Position (if appropriate) Lying  Pulse Rate 71  Pulse Rate Source Monitor  Resp 17  MEWS COLOR  MEWS Score Color Green  Oxygen Therapy  SpO2 99 %  O2 Device Room Air  MEWS Score  MEWS Temp 0  MEWS Systolic 0  MEWS Pulse 0  MEWS RR 0  MEWS LOC 0  MEWS Score 0  Provider Notification  Provider Name/Title Gershon Cull, NP  Date Provider Notified 12/05/21  Time Provider Notified [phone]  Method of Notification Page  Notification Reason Critical result  Test performed and critical result K+ - 2.7  Date Critical Result Received 12/05/21  Time Critical Result Received 0501

## 2021-12-05 NOTE — Plan of Care (Signed)

## 2021-12-06 DIAGNOSIS — N179 Acute kidney failure, unspecified: Secondary | ICD-10-CM | POA: Diagnosis not present

## 2021-12-06 DIAGNOSIS — N1831 Chronic kidney disease, stage 3a: Secondary | ICD-10-CM | POA: Diagnosis not present

## 2021-12-06 LAB — PHOSPHORUS: Phosphorus: 1.2 mg/dL — ABNORMAL LOW (ref 2.5–4.6)

## 2021-12-06 LAB — MAGNESIUM: Magnesium: 1.8 mg/dL (ref 1.7–2.4)

## 2021-12-06 LAB — BASIC METABOLIC PANEL
Anion gap: 4 — ABNORMAL LOW (ref 5–15)
BUN: 12 mg/dL (ref 8–23)
CO2: 23 mmol/L (ref 22–32)
Calcium: 8.2 mg/dL — ABNORMAL LOW (ref 8.9–10.3)
Chloride: 115 mmol/L — ABNORMAL HIGH (ref 98–111)
Creatinine, Ser: 2.05 mg/dL — ABNORMAL HIGH (ref 0.44–1.00)
GFR, Estimated: 25 mL/min — ABNORMAL LOW (ref >60)
Glucose, Bld: 135 mg/dL — ABNORMAL HIGH (ref 70–99)
Potassium: 3.9 mmol/L (ref 3.5–5.1)
Sodium: 142 mmol/L (ref 135–145)

## 2021-12-06 MED ORDER — K PHOS MONO-SOD PHOS DI & MONO 155-852-130 MG PO TABS
500.0000 mg | ORAL_TABLET | Freq: Three times a day (TID) | ORAL | Status: DC
  Administered 2021-12-06 (×3): 500 mg via ORAL
  Filled 2021-12-06 (×5): qty 2

## 2021-12-06 MED ORDER — SODIUM CHLORIDE 0.9 % IV SOLN: INTRAVENOUS | Status: DC

## 2021-12-06 NOTE — Progress Notes (Signed)
Mobility Specialist - Progress Note   12/06/21 1330  Mobility  HOB Elevated/Bed Position Self regulated  Activity Ambulated independently in hallway  Range of Motion/Exercises Active  Level of Assistance Independent  Assistive Device None  Distance Ambulated (ft) 350 ft  Activity Response Tolerated well  Transport method Ambulatory  $Mobility charge 1 Mobility   Pt received in room and agreeable to mobility.  Pt to room after session with all needs met.    [facility]

## 2021-12-06 NOTE — Plan of Care (Signed)

## 2021-12-06 NOTE — Progress Notes (Signed)
Mobility Specialist Cancellation/Refusal Note:   Reason for Cancellation/Refusal: Pt declined mobility at this time. Pt eating breakfast at this time. Will check back as schedule permits.       [facility]

## 2021-12-06 NOTE — Progress Notes (Signed)
PROGRESS NOTE  Connie ERDMAN  EUM:[id number] DOB: 03-06-1951 DOA: 12/03/2021 PCP: Mayra Neer, MD   Brief Narrative: Patient is a 71 year old female with history of anxiety, bilateral lower extremity edema, chronic fatigue syndrome, fibromyalgia, depression, DVT, hypertension, hyperlipidemia, PVD, Sjogren's syndrome who presented to the emergency department from home with complaints of diarrhea, dehydration, lightheadedness.  Patient was recently diagnosed with recurrent C. difficile colitis and is being treated with second round of Dificid by her PCP.  She reported of decreased appetite.  On presentation she was hemodynamically stable.  Lab work showed low potassium, creatinine of 2.9.  Urine analysis was positive for possible UTI with leukocytes, bacteria so she was given a dose of fosfomycin.  CT renal study did not show any acute intra-abdominal finding.  Assessment & Plan:  Principal Problem:   Acute renal failure superimposed on stage 3a chronic kidney disease (HCC) Active Problems:   Essential hypertension   Mixed hyperlipidemia   Hypokalemia   Major depressive disorder   Breast cancer of upper-inner quadrant of left female breast ([place])   Anemia of chronic disease   Abdominal aortic aneurysm (AAA) 3.0 cm to 5.0 cm in diameter in female [facility])   Aortic atherosclerosis (HCC)   Recurrent Clostridioides difficile diarrhea   Asymptomatic bacteriuria   CAD (coronary artery disease)   Tobacco use   AKI on CKD stage IIIa: Baseline creatinine ranges from 1.3-1.4.  Presented with creatinine Of 2.9.  Kidney function slowly improving  with IV fluid.   Continue fluid at current rate  Hypernatremia/hypokalemia/hypophosphatemia: All being monitored and supplemented as needed  Recurrent C. difficile diarrhea: Currently on second round of Dificid.  Continue. .  Afebrile, no leukocytosis.  Diarrhea has slowed  down, she denies any abdominal pain.  C. difficile panel not sent here, GI pathogen  panel came out to be negative.  Suspicion for UTI: Urinalysis showed leukocytes, bacteria.  She was given a dose of fosfomycin.  She denies any abdominal pain or dysuria.  Hypertension: On Lasix, ramipril at home.  Currently on hold  Depression: On Wellbutrin  History of left breast cancer: On Arimidex.  We recommend outpatient follow-up with oncology  Anemia of chronic disease: Continue monitoring, hemoglobin stable  History of coronary artery disease: On aspirin, statin, no anginal symptoms  Abdominal aortic aneurysm:There is a 3.1 cm infrarenal AAA with recommendation for ultrasound follow-up every 3 years  Hyperlipidemia: Continue Lipitor  Smoker: Smoking cessation advised.  Continue nicotine patch Nutrition Problem: Inadequate oral intake Etiology: acute illness    DVT prophylaxis:SCDs Start: 12/04/21 5400     Code Status: Full Code  Family Communication: Called and discussed with husband on phone on 9/7  Patient status:Inpatient  Patient is from :Home  Anticipated discharge QQ:PYPP  Estimated DC date:tomorrow   Consultants: None  Procedures:None  Antimicrobials:  Anti-infectives (From admission, onward)    Start     Dose/Rate Route Frequency Ordered Stop   12/04/21 1000  fidaxomicin (DIFICID) tablet 200 mg        200 mg Oral 2 times daily 12/04/21 0807 12/07/21 2359   12/04/21 0200  fosfomycin (MONUROL) packet 3 g        3 g Oral  Once 12/04/21 0149 12/04/21 0223       Subjective: Patient seen and examined at the bedside today.  Hemodynamically stable.  Comfortable without any complaints.  Diarrhea has slowed down.  She has had 2 bowel movements since yesterday.  Objective: Vitals:   12/05/21 5093 12/05/21 1403  12/05/21 2225 12/06/21 0609  BP: (!) 155/79 (!) 156/78 (!) 158/98 (!) 146/74  Pulse: 77 73 86 71  Resp: '17 15 17 17  '$ Temp: 98.2 F (36.8 C) 98.3 F (36.8 C) 97.9 F (36.6 C) 98 F (36.7 C)  TempSrc: Oral Oral Oral Oral  SpO2: 100%  100% 99% 100%  Weight:      Height:        Intake/Output Summary (Last 24 hours) at 12/06/2021 1042 Last data filed at 12/06/2021 0200 Gross per 24 hour  Intake 1281.42 ml  Output --  Net 1281.42 ml   Filed Weights   12/03/21 2035  Weight: 49.9 kg    Examination:  General exam: Overall comfortable, not in distress HEENT: PERRL Respiratory system:  no wheezes or crackles  Cardiovascular system: S1 & S2 heard, RRR.  Gastrointestinal system: Abdomen is nondistended, soft and nontender. Central nervous system: Alert and oriented Extremities: No edema, no clubbing ,no cyanosis Skin: No rashes, no ulcers,no icterus     Data Reviewed: I have personally reviewed following labs and imaging studies  CBC: Recent Labs  Lab 12/02/21 0547 12/03/21 2106 12/04/21 0839 12/05/21 0416  WBC 7.4 6.9 8.2 6.7  NEUTROABS 4.9 4.6  --   --   HGB 10.8* 11.2* 11.1* 10.7*  HCT 34.0* 34.5* 34.3* 32.7*  MCV 93.9 95.0 95.8 94.2  PLT 247 251 246 086   Basic Metabolic Panel: Recent Labs  Lab 12/02/21 0547 12/03/21 2106 12/04/21 0839 12/05/21 0416 12/05/21 1440 12/06/21 0409  NA 143 145 145 147*  --  142  K 3.1* 2.8* 3.1* 2.7* 3.1* 3.9  CL 111 115* 118* 122*  --  115*  CO2 22 22 20* 20*  --  23  GLUCOSE 102* 75 56* 69*  --  135*  BUN '18 18 16 11  '$ --  12  CREATININE 2.97* 2.91* 2.59* 2.07*  --  2.05*  CALCIUM 9.1 8.9 8.5* 8.0*  --  8.2*  MG  --   --   --  1.8  --  1.8  PHOS  --   --   --   --   --  1.2*     Recent Results (from the past 240 hour(s))  Gastrointestinal Panel by PCR , Stool     Status: None   Collection Time: 12/04/21 11:44 AM   Specimen: Stool  Result Value Ref Range Status   Campylobacter species NOT DETECTED NOT DETECTED Final   Plesimonas shigelloides NOT DETECTED NOT DETECTED Final   Salmonella species NOT DETECTED NOT DETECTED Final   Yersinia enterocolitica NOT DETECTED NOT DETECTED Final   Vibrio species NOT DETECTED NOT DETECTED Final   Vibrio cholerae NOT  DETECTED NOT DETECTED Final   Enteroaggregative E coli (EAEC) NOT DETECTED NOT DETECTED Final   Enteropathogenic E coli (EPEC) NOT DETECTED NOT DETECTED Final   Enterotoxigenic E coli (ETEC) NOT DETECTED NOT DETECTED Final   Shiga like toxin producing E coli (STEC) NOT DETECTED NOT DETECTED Final   Shigella/Enteroinvasive E coli (EIEC) NOT DETECTED NOT DETECTED Final   Cryptosporidium NOT DETECTED NOT DETECTED Final   Cyclospora cayetanensis NOT DETECTED NOT DETECTED Final   Entamoeba histolytica NOT DETECTED NOT DETECTED Final   Giardia lamblia NOT DETECTED NOT DETECTED Final   Adenovirus F40/41 NOT DETECTED NOT DETECTED Final   Astrovirus NOT DETECTED NOT DETECTED Final   Norovirus GI/GII NOT DETECTED NOT DETECTED Final   Rotavirus A NOT DETECTED NOT DETECTED Final   Sapovirus (I,  II, IV, and V) NOT DETECTED NOT DETECTED Final    Comment: Performed at [facility], [address] [place], [place] [address]     Radiology Studies: ECHOCARDIOGRAM COMPLETE  Result Date: 12/05/2021    ECHOCARDIOGRAM REPORT   Patient Name:   TELECIA LAROCQUE Date of Exam: 12/05/2021 Medical Rec #:  [id number]         Height:       62.0 in Accession #:    [id number]        Weight:       110.0 lb Date of Birth:  1951-03-06          BSA:          1.483 m Patient Age:    10 years          BP:           156/78 mmHg Patient Gender: F                 HR:           63 bpm. Exam Location:  Inpatient Procedure: 2D Echo, Cardiac Doppler and Color Doppler Indications:    CAD  History:        Patient has prior history of Echocardiogram examinations, most                 recent 12/18/2014. Risk Factors:Hypertension and Dyslipidemia.  Sonographer:    Jefferey Pica Referring Phys: [id number] [place]  1. Left ventricular ejection fraction, by estimation, is 55 to 60%. The left ventricle has normal function. The left ventricle has no regional wall motion abnormalities. Left ventricular diastolic  parameters are consistent with Grade I diastolic dysfunction (impaired relaxation).  2. Right ventricular systolic function is normal. The right ventricular size is normal. There is mildly elevated pulmonary artery systolic pressure.  3. Left atrial size was mildly dilated.  4. The mitral valve is normal in structure. Mild mitral valve regurgitation. No evidence of mitral stenosis. Moderate mitral annular calcification.  5. The aortic valve is normal in structure. Aortic valve regurgitation is mild. Aortic valve sclerosis/calcification is present, without any evidence of aortic stenosis.  6. The inferior vena cava is normal in size with greater than 50% respiratory variability, suggesting right atrial pressure of 3 mmHg. FINDINGS  Left Ventricle: Left ventricular ejection fraction, by estimation, is 55 to 60%. The left ventricle has normal function. The left ventricle has no regional wall motion abnormalities. The left ventricular internal cavity size was normal in size. There is  no left ventricular hypertrophy. Left ventricular diastolic parameters are consistent with Grade I diastolic dysfunction (impaired relaxation). Right Ventricle: The right ventricular size is normal. Right ventricular systolic function is normal. There is mildly elevated pulmonary artery systolic pressure. The tricuspid regurgitant velocity is 2.82 m/s, and with an assumed right atrial pressure of 5 mmHg, the estimated right ventricular systolic pressure is 62.5 mmHg. Left Atrium: Left atrial size was mildly dilated. Right Atrium: Right atrial size was normal in size. Pericardium: There is no evidence of pericardial effusion. Mitral Valve: The mitral valve is normal in structure. Moderate mitral annular calcification. Mild mitral valve regurgitation. No evidence of mitral valve stenosis. Tricuspid Valve: The tricuspid valve is normal in structure. Tricuspid valve regurgitation is mild . No evidence of tricuspid stenosis. Aortic Valve: The  aortic valve is normal in structure. Aortic valve regurgitation is mild. Aortic regurgitation PHT measures 398 msec. Aortic valve sclerosis/calcification is present, without any  evidence of aortic stenosis. Aortic valve peak gradient measures 16.2 mmHg. Pulmonic Valve: The pulmonic valve was normal in structure. Pulmonic valve regurgitation is not visualized. No evidence of pulmonic stenosis. Aorta: The aortic root is normal in size and structure. Venous: The inferior vena cava is normal in size with greater than 50% respiratory variability, suggesting right atrial pressure of 3 mmHg. IAS/Shunts: No atrial level shunt detected by color flow Doppler.  LEFT VENTRICLE PLAX 2D LVIDd:         4.30 cm LVIDs:         3.00 cm LV PW:         1.10 cm LV IVS:        1.10 cm LVOT diam:     1.90 cm LV SV:         80 LV SV Index:   54 LVOT Area:     2.84 cm  RIGHT VENTRICLE          IVC RV Basal diam:  3.00 cm  IVC diam: 1.50 cm LEFT ATRIUM             Index        RIGHT ATRIUM           Index LA diam:        4.00 cm 2.70 cm/m   RA Area:     12.00 cm LA Vol (A2C):   55.7 ml 37.56 ml/m  RA Volume:   30.10 ml  20.30 ml/m LA Vol (A4C):   54.8 ml 36.96 ml/m LA Biplane Vol: 55.5 ml 37.43 ml/m  AORTIC VALVE                 PULMONIC VALVE AV Area (Vmax): 1.71 cm     PV Vmax:       1.00 m/s AV Vmax:        201.00 cm/s  PV Peak grad:  4.0 mmHg AV Peak Grad:   16.2 mmHg LVOT Vmax:      121.00 cm/s LVOT Vmean:     79.200 cm/s LVOT VTI:       0.283 m AI PHT:         398 msec  AORTA Ao Root diam: 3.20 cm Ao Asc diam:  3.50 cm MITRAL VALVE                TRICUSPID VALVE MV Area (PHT): 5.06 cm     TR Peak grad:   31.8 mmHg MV Decel Time: 150 msec     TR Vmax:        282.00 cm/s MV E velocity: 112.00 cm/s MV A velocity: 121.00 cm/s  SHUNTS MV E/A ratio:  0.93         Systemic VTI:  0.28 m                             Systemic Diam: 1.90 cm Kirk Ruths MD Electronically signed by Kirk Ruths MD Signature Date/Time: 12/05/2021/3:13:00  PM    Final     Scheduled Meds:  anastrozole  1 mg Oral QHS   vitamin C  1,000 mg Oral Q lunch   aspirin EC  81 mg Oral Daily   B-complex with vitamin C  1 tablet Oral Daily   brexpiprazole  1 mg Oral q morning   calcium carbonate  1 tablet Oral Q lunch   cholecalciferol  2,000 Units Oral Q lunch   ezetimibe  10 mg Oral QHS   fidaxomicin  200 mg Oral BID   gabapentin  300 mg Oral Daily   lactose free nutrition  237 mL Oral TID WC   multivitamin with minerals  1 tablet Oral Daily   nicotine  14 mg Transdermal Daily   phosphorus  500 mg Oral TID   rosuvastatin  20 mg Oral QHS   venlafaxine XR  150 mg Oral Q breakfast   Continuous Infusions:  sodium chloride 100 mL/hr at 12/06/21 0941     LOS: 2 days   Shelly Coss, MD [facility] P9/10/2021, 10:42 AM

## 2021-12-07 DIAGNOSIS — N179 Acute kidney failure, unspecified: Secondary | ICD-10-CM | POA: Diagnosis not present

## 2021-12-07 DIAGNOSIS — N1831 Chronic kidney disease, stage 3a: Secondary | ICD-10-CM | POA: Diagnosis not present

## 2021-12-07 LAB — BASIC METABOLIC PANEL
Anion gap: 6 (ref 5–15)
BUN: 14 mg/dL (ref 8–23)
CO2: 23 mmol/L (ref 22–32)
Calcium: 8.1 mg/dL — ABNORMAL LOW (ref 8.9–10.3)
Chloride: 116 mmol/L — ABNORMAL HIGH (ref 98–111)
Creatinine, Ser: 1.74 mg/dL — ABNORMAL HIGH (ref 0.44–1.00)
GFR, Estimated: 31 mL/min — ABNORMAL LOW (ref >60)
Glucose, Bld: 83 mg/dL (ref 70–99)
Potassium: 3.5 mmol/L (ref 3.5–5.1)
Sodium: 145 mmol/L (ref 135–145)

## 2021-12-07 LAB — PHOSPHORUS: Phosphorus: 2.9 mg/dL (ref 2.5–4.6)

## 2021-12-07 MED ORDER — AMLODIPINE BESYLATE 10 MG PO TABS
10.0000 mg | ORAL_TABLET | Freq: Every day | ORAL | Status: DC
Start: 2021-12-07 — End: 2021-12-07
  Administered 2021-12-07: 10 mg via ORAL
  Filled 2021-12-07: qty 1

## 2021-12-07 MED ORDER — AMLODIPINE BESYLATE 10 MG PO TABS: 10.0000 mg | ORAL_TABLET | Freq: Every day | ORAL | 1 refills | Status: AC

## 2021-12-07 MED ORDER — DIFICID 200 MG PO TABS: 200.0000 mg | ORAL_TABLET | Freq: Two times a day (BID) | ORAL | Status: DC

## 2021-12-07 NOTE — Discharge Summary (Signed)
Physician Discharge Summary  Connie Bennett RWE:[id number] DOB: [birth year] DOA: 12/03/2021  PCP: Mayra Neer, MD  Admit date: 12/03/2021 Discharge date: 12/07/2021  Admitted From: Home Disposition:  Home  Discharge Condition:Stable CODE STATUS:FULL Diet recommendation: Heart Healthy  Brief/Interim Summary:  Patient is a 71 year old female with history of anxiety, bilateral lower extremity edema, chronic fatigue syndrome, fibromyalgia, depression, DVT, hypertension, hyperlipidemia, PVD, Sjogren's syndrome who presented to the emergency department from home with complaints of diarrhea, dehydration, lightheadedness.  Patient was recently diagnosed with recurrent C. difficile colitis and is being treated with second round of Dificid by her PCP.  She reported of decreased appetite.  On presentation she was hemodynamically stable.  Lab work showed low potassium, creatinine of 2.9. Urine analysis was positive for possible UTI with leukocytes, bacteria so she was given a dose of fosfomycin.  CT renal study did not show any acute intra-abdominal finding.  Her diarrhea slowly improved, kidney function improved with IV fluids.  She is medically stable for discharge home today.  Following problems were addressed during hospitalization:   AKI on CKD stage IIIa: Baseline creatinine ranges from 1.3-1.4.  Presented with creatinine Of 2.9.  Kidney function improved and is now close to baseline.  Check kidney function in a week   Hypernatremia/hypokalemia/hypophosphatemia: Monitored and supplemented  Recurrent C. difficile diarrhea: Currently on second round of Dificid.  Continue. .  Afebrile, no leukocytosis.  Diarrhea has slowed  down, she denies any abdominal pain.  C. difficile panel not sent here, GI pathogen panel came out to be negative.  She will finish the course of Dificid today   Suspicion for UTI: Urinalysis showed leukocytes, bacteria.  She was given a dose of fosfomycin.  She denies any abdominal  pain or dysuria.   Hypertension: On Lasix, ramipril at home.  Currently on hold.  Started on amlodipine   Depression: On Wellbutrin   History of left breast cancer: On Arimidex.  We recommend outpatient follow-up with oncology   Anemia of chronic disease: Continue monitoring, hemoglobin stable   History of coronary artery disease: On aspirin, statin, no anginal symptoms   Abdominal aortic aneurysm:There is a 3.1 cm infrarenal AAA with recommendation for ultrasound follow-up every 3 years   Hyperlipidemia: Continue Lipitor    Discharge Diagnoses:  Principal Problem:   Acute renal failure superimposed on stage 3a chronic kidney disease ([place]) Active Problems:   Essential hypertension   Mixed hyperlipidemia   Hypokalemia   Major depressive disorder   Breast cancer of upper-inner quadrant of left female breast ([place])   Anemia of chronic disease   Abdominal aortic aneurysm (AAA) 3.0 cm to 5.0 cm in diameter in female [facility])   Aortic atherosclerosis ([facility])   Recurrent Clostridioides difficile diarrhea   Asymptomatic bacteriuria   CAD (coronary artery disease)   Tobacco use    Discharge Instructions  Discharge Instructions     Diet - low sodium heart healthy   Complete by: As directed    Discharge instructions   Complete by: As directed    1)Please take your medications as instructed 2)Follow up with your PCP in a week.  Do a BMP test to check your kidney function.   Increase activity slowly   Complete by: As directed       Allergies as of 12/07/2021       Reactions   Carbocaine [mepivacaine Hcl] Hives, Swelling   Penicillins Nausea And Vomiting, Swelling, Rash, Other (See Comments)   Has patient had a PCN reaction  causing immediate rash, facial/tongue/throat swelling, SOB or lightheadedness with hypotension: Yes Has patient had a PCN reaction causing severe rash involving mucus membranes or skin necrosis: No Has patient had a PCN reaction that required hospitalization  No Has patient had a PCN reaction occurring within the last 10 years: Yes If all of the above answers are "NO", then may proceed with Cephalosporin use.   Sulfa Antibiotics Nausea And Vomiting, Swelling, Rash, Other (See Comments)   Headache   Latex Rash, Other (See Comments)   Reaction to gloves and bandaids (paper tape is ok)        Medication List     STOP taking these medications    furosemide 20 MG tablet Commonly known as: Lasix   furosemide 40 MG tablet Commonly known as: LASIX   ramipril 10 MG capsule Commonly known as: ALTACE       TAKE these medications    acetaminophen 500 MG tablet Commonly known as: TYLENOL Take 1,000 mg by mouth every 6 (six) hours as needed (pain).   ALPRAZolam 1 MG tablet Commonly known as: XANAX Take 1 mg by mouth 3 (three) times daily as needed for anxiety or sleep (nerves).   amLODipine 10 MG tablet Commonly known as: NORVASC Take 1 tablet (10 mg total) by mouth daily. Start taking on: December 08, 2021   anastrozole 1 MG tablet Commonly known as: ARIMIDEX TAKE 1 TABLET BY MOUTH EVERY DAY What changed: when to take this   aspirin EC 81 MG tablet Take 81 mg by mouth daily. Swallow whole.   B-complex with vitamin C tablet Take 1 tablet by mouth daily.   brexpiprazole 1 MG Tabs tablet Commonly known as: REXULTI Take 1 mg by mouth every morning.   calcium carbonate 600 MG Tabs tablet Commonly known as: OS-CAL Take 600 mg by mouth daily with lunch.   Dificid 200 MG Tabs tablet Generic drug: fidaxomicin Take 1 tablet (200 mg total) by mouth 2 (two) times daily. Take one dose this evening and then stop What changed: additional instructions   ezetimibe 10 MG tablet Commonly known as: ZETIA Take 10 mg by mouth at bedtime.   ferrous sulfate 325 (65 FE) MG tablet Take 1 tablet (325 mg total) by mouth daily with breakfast.   Fish Oil 1000 MG Caps Take 1,000 mg by mouth daily with lunch.   Flax Seed Oil 1000 MG  Caps Take 1,000 mg by mouth daily with lunch.   gabapentin 600 MG tablet Commonly known as: NEURONTIN Take 1 tablet (600 mg total) by mouth at bedtime for 14 days. Take one tablet (600 mg) by mouth every morning and two tablets (1200 mg) at night What changed:  how much to take when to take this additional instructions   lidocaine 5 % Commonly known as: LIDODERM Place 1 patch onto the skin at bedtime as needed (pain).   meclizine 25 MG tablet Commonly known as: ANTIVERT Take 25 mg by mouth 4 (four) times daily as needed for dizziness.   naloxegol oxalate 25 MG Tabs tablet Commonly known as: MOVANTIK Take 25 mg by mouth daily as needed (for constipation).   naloxone 4 MG/0.1ML Liqd nasal spray kit Commonly known as: NARCAN Place 1 spray into the nose once as needed (for opioid overdose).   oxyCODONE-acetaminophen 10-325 MG tablet Commonly known as: PERCOCET Take 1 tablet by mouth 5 (five) times daily as needed for pain.   polyethylene glycol 17 g packet Commonly known as: MIRALAX / GLYCOLAX Take  17 g by mouth daily as needed for mild constipation.   ProAir HFA 108 (90 Base) MCG/ACT inhaler Generic drug: albuterol Inhale 2 puffs into the lungs every 4 (four) hours as needed for wheezing or shortness of breath.   PROBIOTIC ACIDOPHILUS PO Take 1 tablet by mouth daily with lunch.   rosuvastatin 20 MG tablet Commonly known as: CRESTOR Take 20 mg by mouth at bedtime.   venlafaxine XR 150 MG 24 hr capsule Commonly known as: EFFEXOR-XR Take 150 mg by mouth 2 (two) times daily with breakfast and lunch.   vitamin C 1000 MG tablet Take 1,000 mg by mouth daily with lunch.   Vitamin D 50 MCG (2000 UT) tablet Take 2,000 Units by mouth daily with lunch.        Follow-up Information     Mayra Neer, MD. Schedule an appointment as soon as possible for a visit in 1 week(s).   Specialty: Family Medicine Contact information: [address] [place]  [place] [address] [phone]                Allergies  Allergen Reactions   Carbocaine [Mepivacaine Hcl] Hives and Swelling   Penicillins Nausea And Vomiting, Swelling, Rash and Other (See Comments)    Has patient had a PCN reaction causing immediate rash, facial/tongue/throat swelling, SOB or lightheadedness with hypotension: Yes Has patient had a PCN reaction causing severe rash involving mucus membranes or skin necrosis: No Has patient had a PCN reaction that required hospitalization No Has patient had a PCN reaction occurring within the last 10 years: Yes If all of the above answers are "NO", then may proceed with Cephalosporin use.   Sulfa Antibiotics Nausea And Vomiting, Swelling, Rash and Other (See Comments)    Headache    Latex Rash and Other (See Comments)    Reaction to gloves and bandaids (paper tape is ok)    Consultations: None   Procedures/Studies: ECHOCARDIOGRAM COMPLETE  Result Date: 12/05/2021    ECHOCARDIOGRAM REPORT   Patient Name:   LE FERRAZ Date of Exam: 12/05/2021 Medical Rec #:  [id number]         Height:       62.0 in Accession #:    [id number]        Weight:       110.0 lb Date of Birth:  Oct 20, 1950          BSA:          1.483 m Patient Age:    35 years          BP:           156/78 mmHg Patient Gender: F                 HR:           63 bpm. Exam Location:  Inpatient Procedure: 2D Echo, Cardiac Doppler and Color Doppler Indications:    CAD  History:        Patient has prior history of Echocardiogram examinations, most                 recent 12/18/2014. Risk Factors:Hypertension and Dyslipidemia.  Sonographer:    Jefferey Pica Referring Phys: [id number] [place]  1. Left ventricular ejection fraction, by estimation, is 55 to 60%. The left ventricle has normal function. The left ventricle has no regional wall motion abnormalities. Left ventricular diastolic parameters are consistent with Grade I diastolic dysfunction (impaired  relaxation).  2. Right ventricular systolic function is normal. The right ventricular size is normal. There is mildly elevated pulmonary artery systolic pressure.  3. Left atrial size was mildly dilated.  4. The mitral valve is normal in structure. Mild mitral valve regurgitation. No evidence of mitral stenosis. Moderate mitral annular calcification.  5. The aortic valve is normal in structure. Aortic valve regurgitation is mild. Aortic valve sclerosis/calcification is present, without any evidence of aortic stenosis.  6. The inferior vena cava is normal in size with greater than 50% respiratory variability, suggesting right atrial pressure of 3 mmHg. FINDINGS  Left Ventricle: Left ventricular ejection fraction, by estimation, is 55 to 60%. The left ventricle has normal function. The left ventricle has no regional wall motion abnormalities. The left ventricular internal cavity size was normal in size. There is  no left ventricular hypertrophy. Left ventricular diastolic parameters are consistent with Grade I diastolic dysfunction (impaired relaxation). Right Ventricle: The right ventricular size is normal. Right ventricular systolic function is normal. There is mildly elevated pulmonary artery systolic pressure. The tricuspid regurgitant velocity is 2.82 m/s, and with an assumed right atrial pressure of 5 mmHg, the estimated right ventricular systolic pressure is 62.0 mmHg. Left Atrium: Left atrial size was mildly dilated. Right Atrium: Right atrial size was normal in size. Pericardium: There is no evidence of pericardial effusion. Mitral Valve: The mitral valve is normal in structure. Moderate mitral annular calcification. Mild mitral valve regurgitation. No evidence of mitral valve stenosis. Tricuspid Valve: The tricuspid valve is normal in structure. Tricuspid valve regurgitation is mild . No evidence of tricuspid stenosis. Aortic Valve: The aortic valve is normal in structure. Aortic valve regurgitation is mild.  Aortic regurgitation PHT measures 398 msec. Aortic valve sclerosis/calcification is present, without any evidence of aortic stenosis. Aortic valve peak gradient measures 16.2 mmHg. Pulmonic Valve: The pulmonic valve was normal in structure. Pulmonic valve regurgitation is not visualized. No evidence of pulmonic stenosis. Aorta: The aortic root is normal in size and structure. Venous: The inferior vena cava is normal in size with greater than 50% respiratory variability, suggesting right atrial pressure of 3 mmHg. IAS/Shunts: No atrial level shunt detected by color flow Doppler.  LEFT VENTRICLE PLAX 2D LVIDd:         4.30 cm LVIDs:         3.00 cm LV PW:         1.10 cm LV IVS:        1.10 cm LVOT diam:     1.90 cm LV SV:         80 LV SV Index:   54 LVOT Area:     2.84 cm  RIGHT VENTRICLE          IVC RV Basal diam:  3.00 cm  IVC diam: 1.50 cm LEFT ATRIUM             Index        RIGHT ATRIUM           Index LA diam:        4.00 cm 2.70 cm/m   RA Area:     12.00 cm LA Vol (A2C):   55.7 ml 37.56 ml/m  RA Volume:   30.10 ml  20.30 ml/m LA Vol (A4C):   54.8 ml 36.96 ml/m LA Biplane Vol: 55.5 ml 37.43 ml/m  AORTIC VALVE                 PULMONIC VALVE AV Area (Vmax): 1.71  cm     PV Vmax:       1.00 m/s AV Vmax:        201.00 cm/s  PV Peak grad:  4.0 mmHg AV Peak Grad:   16.2 mmHg LVOT Vmax:      121.00 cm/s LVOT Vmean:     79.200 cm/s LVOT VTI:       0.283 m AI PHT:         398 msec  AORTA Ao Root diam: 3.20 cm Ao Asc diam:  3.50 cm MITRAL VALVE                TRICUSPID VALVE MV Area (PHT): 5.06 cm     TR Peak grad:   31.8 mmHg MV Decel Time: 150 msec     TR Vmax:        282.00 cm/s MV E velocity: 112.00 cm/s MV A velocity: 121.00 cm/s  SHUNTS MV E/A ratio:  0.93         Systemic VTI:  0.28 m                             Systemic Diam: 1.90 cm Kirk Ruths MD Electronically signed by Kirk Ruths MD Signature Date/Time: 12/05/2021/3:13:00 PM    Final    CT Renal Stone Study  Result Date: 12/04/2021 CLINICAL  DATA:  Flank pain, kidney stone suspected. Diarrhea, abdominal cramping EXAM: CT ABDOMEN AND PELVIS WITHOUT CONTRAST TECHNIQUE: Multidetector CT imaging of the abdomen and pelvis was performed following the standard protocol without IV contrast. RADIATION DOSE REDUCTION: This exam was performed according to the departmental dose-optimization program which includes automated exposure control, adjustment of the mA and/or kV according to patient size and/or use of iterative reconstruction technique. COMPARISON:  03/04/2007 FINDINGS: Lower chest: Visualized lung bases are clear. Extensive right coronary artery calcification. Global cardiac size within normal limits. No pericardial effusion. Bilateral breast implants noted. Hepatobiliary: No focal liver abnormality is seen. No gallstones, gallbladder wall thickening, or biliary dilatation. Pancreas: Unremarkable Spleen: Unremarkable Adrenals/Urinary Tract: The adrenal glands are unremarkable. The kidneys are normal in size and position. A few scattered cortical calcifications are seen within the right kidney which may be post inflammatory or posttraumatic in nature. No urinary renal or ureteral calculi are identified. There is no hydronephrosis. 18 mm simple cyst is seen arising exophytically from the lateral aspect the left kidney. 22 mm hyperdense cyst measures 23 Hounsfield units and arises from the upper pole the left kidney, slightly enlarged since prior examination. This was better assessed on prior MRI examination of 05/11/2007 and is compatible with a hemorrhagic cyst. No follow-up imaging is recommended for these lesions. The bladder is unremarkable. Stomach/Bowel: Stomach is within normal limits. Appendix appears normal. No evidence of bowel wall thickening, distention, or inflammatory changes. Vascular/Lymphatic: Extensive aortoiliac atherosclerotic calcification. 3.1 cm fusiform infrarenal abdominal aortic aneurysm is present. No pathologic adenopathy within  the abdomen and pelvis. Reproductive: Uterus and bilateral adnexa are unremarkable. Other: No abdominal wall hernia Musculoskeletal: L4-5 posterior decompression and fusion with instrumentation has been performed. Degenerative changes are seen within the lumbar spine, most severe at L5-S1. Dorsal column stimulator device is seen within the subcutaneous soft tissues of the left lumbar region with leads entering the spinal canal at T12-L1 extending cranially beyond the margin of the examination. No acute bone abnormality. No lytic or blastic bone lesion. IMPRESSION: 1. No acute intra-abdominal pathology identified. No definite radiographic explanation for  the patient's reported symptoms. 2. Extensive right coronary artery calcification. 3. 3.1 cm infrarenal abdominal aortic aneurysm. Recommend follow-up ultrasound every 3 years. This recommendation follows ACR consensus guidelines: White Paper of the ACR Incidental Findings Committee II on Vascular Findings. J Am Coll Radiol 2013; 10:789-794. Aortic Atherosclerosis (ICD10-I70.0). Electronically Signed   By: Fidela Salisbury M.D.   On: 12/04/2021 00:24      Subjective: Patient seen and examined the bedside today.  Hemodynamically stable for discharge.  I called the husband and discussed about the discharge planning.  Discharge Exam: Vitals:   12/06/21 2204 12/07/21 0710  BP: (!) 158/87 (!) 165/98  Pulse: 79 76  Resp: 17 15  Temp:  98.8 F (37.1 C)  SpO2: 99% 100%   Vitals:   12/06/21 0609 12/06/21 1454 12/06/21 2204 12/07/21 0710  BP: (!) 146/74 (!) 176/76 (!) 158/87 (!) 165/98  Pulse: 71 78 79 76  Resp: _0 Temp: 98 F (36.7 C) 98.1 F (36.7 C)  98.8 F (37.1 C)  TempSrc: Oral Oral  Oral  SpO2: 100% 96% 99% 100%  Weight:      Height:        General: Pt is alert, awake, not in acute distress Cardiovascular: RRR, S1/S2 +, no rubs, no gallops Respiratory: CTA bilaterally, no wheezing, no rhonchi Abdominal: Soft, NT, ND, bowel  sounds + Extremities: no edema, no cyanosis    The results of significant diagnostics from this hospitalization (including imaging, microbiology, ancillary and laboratory) are listed below for reference.     Microbiology: Recent Results (from the past 240 hour(s))  Gastrointestinal Panel by PCR , Stool     Status: None   Collection Time: 12/04/21 11:44 AM   Specimen: Stool  Result Value Ref Range Status   Campylobacter species NOT DETECTED NOT DETECTED Final   Plesimonas shigelloides NOT DETECTED NOT DETECTED Final   Salmonella species NOT DETECTED NOT DETECTED Final   Yersinia enterocolitica NOT DETECTED NOT DETECTED Final   Vibrio species NOT DETECTED NOT DETECTED Final   Vibrio cholerae NOT DETECTED NOT DETECTED Final   Enteroaggregative E coli (EAEC) NOT DETECTED NOT DETECTED Final   Enteropathogenic E coli (EPEC) NOT DETECTED NOT DETECTED Final   Enterotoxigenic E coli (ETEC) NOT DETECTED NOT DETECTED Final   Shiga like toxin producing E coli (STEC) NOT DETECTED NOT DETECTED Final   Shigella/Enteroinvasive E coli (EIEC) NOT DETECTED NOT DETECTED Final   Cryptosporidium NOT DETECTED NOT DETECTED Final   Cyclospora cayetanensis NOT DETECTED NOT DETECTED Final   Entamoeba histolytica NOT DETECTED NOT DETECTED Final   Giardia lamblia NOT DETECTED NOT DETECTED Final   Adenovirus F40/41 NOT DETECTED NOT DETECTED Final   Astrovirus NOT DETECTED NOT DETECTED Final   Norovirus GI/GII NOT DETECTED NOT DETECTED Final   Rotavirus A NOT DETECTED NOT DETECTED Final   Sapovirus (I, II, IV, and V) NOT DETECTED NOT DETECTED Final    Comment: Performed at [facility], [address] [place], [place] [address]     Labs: BNP (last 3 results) No results for input(s): "BNP" in the last 8760 hours. Basic Metabolic Panel: Recent Labs  Lab 12/03/21 2106 12/04/21 0839 12/05/21 0416 12/05/21 1440 12/06/21 0409 12/07/21 0346  NA 145 145 147*  --  142 145  K 2.8* 3.1* 2.7* 3.1*  3.9 3.5  CL 115* 118* 122*  --  115* 116*  CO2 22 20* 20*  --  23 23  GLUCOSE 75 56* 69*  --  135* 83  BUN _0 --  12 14  CREATININE 2.91* 2.59* 2.07*  --  2.05* 1.74*  CALCIUM 8.9 8.5* 8.0*  --  8.2* 8.1*  MG  --   --  1.8  --  1.8  --   PHOS  --   --   --   --  1.2* 2.9   Liver Function Tests: Recent Labs  Lab 12/02/21 0547 12/03/21 2106 12/05/21 0416  AST 36 32 26  ALT _1 ALKPHOS 62 64 53  BILITOT 0.2* 0.6 0.4  PROT 7.1 7.3 5.7*  ALBUMIN 3.8 3.6 3.0*   Recent Labs  Lab 12/03/21 2106  LIPASE 101*   No results for input(s): "AMMONIA" in the last 168 hours. CBC: Recent Labs  Lab 12/02/21 0547 12/03/21 2106 12/04/21 0839 12/05/21 0416  WBC 7.4 6.9 8.2 6.7  NEUTROABS 4.9 4.6  --   --   HGB 10.8* 11.2* 11.1* 10.7*  HCT 34.0* 34.5* 34.3* 32.7*  MCV 93.9 95.0 95.8 94.2  PLT 247 251 246 215   Cardiac Enzymes: Recent Labs  Lab 12/04/21 0839  CKTOTAL 88   BNP: Invalid input(s): "POCBNP" CBG: No results for input(s): "GLUCAP" in the last 168 hours. D-Dimer No results for input(s): "DDIMER" in the last 72 hours. Hgb A1c No results for input(s): "HGBA1C" in the last 72 hours. Lipid Profile No results for input(s): "CHOL", "HDL", "LDLCALC", "TRIG", "CHOLHDL", "LDLDIRECT" in the last 72 hours. Thyroid function studies No results for input(s): "TSH", "T4TOTAL", "T3FREE", "THYROIDAB" in the last 72 hours.  Invalid input(s): "FREET3" Anemia work up No results for input(s): "VITAMINB12", "FOLATE", "FERRITIN", "TIBC", "IRON", "RETICCTPCT" in the last 72 hours. Urinalysis    Component Value Date/Time   COLORURINE YELLOW 12/03/2021 2055   APPEARANCEUR CLOUDY (A) 12/03/2021 2055   LABSPEC 1.019 12/03/2021 2055   PHURINE 5.0 12/03/2021 2055   GLUCOSEU NEGATIVE 12/03/2021 2055   HGBUR NEGATIVE 12/03/2021 2055   BILIRUBINUR NEGATIVE 12/03/2021 2055   KETONESUR 20 (A) 12/03/2021 2055   PROTEINUR >=300 (A) 12/03/2021 2055   NITRITE NEGATIVE 12/03/2021  2055   LEUKOCYTESUR LARGE (A) 12/03/2021 2055   Sepsis Labs Recent Labs  Lab 12/02/21 0547 12/03/21 2106 12/04/21 0839 12/05/21 0416  WBC 7.4 6.9 8.2 6.7   Microbiology Recent Results (from the past 240 hour(s))  Gastrointestinal Panel by PCR , Stool     Status: None   Collection Time: 12/04/21 11:44 AM   Specimen: Stool  Result Value Ref Range Status   Campylobacter species NOT DETECTED NOT DETECTED Final   Plesimonas shigelloides NOT DETECTED NOT DETECTED Final   Salmonella species NOT DETECTED NOT DETECTED Final   Yersinia enterocolitica NOT DETECTED NOT DETECTED Final   Vibrio species NOT DETECTED NOT DETECTED Final   Vibrio cholerae NOT DETECTED NOT DETECTED Final   Enteroaggregative E coli (EAEC) NOT DETECTED NOT DETECTED Final   Enteropathogenic E coli (EPEC) NOT DETECTED NOT DETECTED Final   Enterotoxigenic E coli (ETEC) NOT DETECTED NOT DETECTED Final   Shiga like toxin producing E coli (STEC) NOT DETECTED NOT DETECTED Final   Shigella/Enteroinvasive E coli (EIEC) NOT DETECTED NOT DETECTED Final   Cryptosporidium NOT DETECTED NOT DETECTED Final   Cyclospora cayetanensis NOT DETECTED NOT DETECTED Final   Entamoeba histolytica NOT DETECTED NOT DETECTED Final   Giardia lamblia NOT DETECTED NOT DETECTED Final   Adenovirus F40/41 NOT DETECTED NOT DETECTED Final   Astrovirus NOT DETECTED NOT DETECTED Final   Norovirus  GI/GII NOT DETECTED NOT DETECTED Final   Rotavirus A NOT DETECTED NOT DETECTED Final   Sapovirus (I, II, IV, and V) NOT DETECTED NOT DETECTED Final    Comment: Performed at [facility], [address] [place], [place] [address]    Please note: You were cared for by a hospitalist during your hospital stay. Once you are discharged, your primary care physician will handle any further medical issues. Please note that NO REFILLS for any discharge medications will be authorized once you are discharged, as it is imperative that you return to your  primary care physician (or establish a relationship with a primary care physician if you do not have one) for your post hospital discharge needs so that they can reassess your need for medications and monitor your lab values.    Time coordinating discharge: 40 minutes  SIGNED:   Shelly Coss, MD  [facility] 12/07/2021, 10:12 AM Pager [phone]  If 7PM-7AM, please contact night-coverage [email or web address] Password [email or web address]

## 2021-12-07 NOTE — Plan of Care (Signed)
Pt stable with no needs at time of d/c instructions and education. Pt denies pain and no signs of distress.

## 2021-12-07 NOTE — Plan of Care (Signed)
  Problem: Education: Goal: Knowledge of General Education information will improve Description: Including pain rating scale, medication(s)/side effects and non-pharmacologic comfort measures Outcome: Progressing   Problem: Clinical Measurements: Goal: Ability to maintain clinical measurements within normal limits will improve Outcome: Progressing Goal: Respiratory complications will improve Outcome: Not Applicable   Problem: Activity: Goal: Risk for activity intolerance will decrease Outcome: Not Applicable   Problem: Nutrition: Goal: Adequate nutrition will be maintained Outcome: Progressing

## 2021-12-07 NOTE — Plan of Care (Signed)
  Problem: Clinical Measurements: Goal: Cardiovascular complication will be avoided Outcome: Progressing   Problem: Nutrition: Goal: Adequate nutrition will be maintained Outcome: Progressing   Problem: Pain Managment: Goal: General experience of comfort will improve Outcome: Progressing   Problem: Skin Integrity: Goal: Risk for impaired skin integrity will decrease Outcome: Progressing   

## 2021-12-10 DIAGNOSIS — E876 Hypokalemia: Secondary | ICD-10-CM | POA: Diagnosis not present

## 2021-12-10 DIAGNOSIS — N179 Acute kidney failure, unspecified: Secondary | ICD-10-CM | POA: Diagnosis not present

## 2021-12-10 DIAGNOSIS — A0471 Enterocolitis due to Clostridium difficile, recurrent: Secondary | ICD-10-CM | POA: Diagnosis not present

## 2021-12-10 DIAGNOSIS — I129 Hypertensive chronic kidney disease with stage 1 through stage 4 chronic kidney disease, or unspecified chronic kidney disease: Secondary | ICD-10-CM | POA: Diagnosis not present

## 2021-12-10 DIAGNOSIS — I714 Abdominal aortic aneurysm, without rupture, unspecified: Secondary | ICD-10-CM | POA: Diagnosis not present

## 2021-12-10 DIAGNOSIS — D649 Anemia, unspecified: Secondary | ICD-10-CM | POA: Diagnosis not present

## 2021-12-16 DIAGNOSIS — N179 Acute kidney failure, unspecified: Secondary | ICD-10-CM | POA: Diagnosis not present

## 2021-12-24 DIAGNOSIS — D649 Anemia, unspecified: Secondary | ICD-10-CM | POA: Diagnosis not present

## 2021-12-24 DIAGNOSIS — A0471 Enterocolitis due to Clostridium difficile, recurrent: Secondary | ICD-10-CM | POA: Diagnosis not present

## 2021-12-24 DIAGNOSIS — E876 Hypokalemia: Secondary | ICD-10-CM | POA: Diagnosis not present

## 2021-12-24 DIAGNOSIS — R600 Localized edema: Secondary | ICD-10-CM | POA: Diagnosis not present

## 2021-12-24 DIAGNOSIS — N179 Acute kidney failure, unspecified: Secondary | ICD-10-CM | POA: Diagnosis not present

## 2021-12-24 DIAGNOSIS — I129 Hypertensive chronic kidney disease with stage 1 through stage 4 chronic kidney disease, or unspecified chronic kidney disease: Secondary | ICD-10-CM | POA: Diagnosis not present

## 2021-12-31 ENCOUNTER — Ambulatory Visit
Admission: RE | Admit: 2021-12-31 | Discharge: 2021-12-31 | Disposition: A | Discharge status: Home / Self Care | Payer: PPO | Source: Ambulatory Visit | Attending: Infectious Diseases | Admitting: Infectious Diseases

## 2021-12-31 ENCOUNTER — Ambulatory Visit (INDEPENDENT_AMBULATORY_CARE_PROVIDER_SITE_OTHER): Payer: PPO | Admitting: Infectious Diseases

## 2021-12-31 ENCOUNTER — Other Ambulatory Visit: Payer: Self-pay

## 2021-12-31 ENCOUNTER — Encounter: Payer: Self-pay | Admitting: Infectious Diseases

## 2021-12-31 VITALS — BP 122/54 | HR 70 | Resp 16 | Ht 62.0 in | Wt 100.0 lb

## 2021-12-31 DIAGNOSIS — A0472 Enterocolitis due to Clostridium difficile, not specified as recurrent: Secondary | ICD-10-CM | POA: Insufficient documentation

## 2021-12-31 DIAGNOSIS — Z5181 Encounter for therapeutic drug level monitoring: Secondary | ICD-10-CM | POA: Insufficient documentation

## 2021-12-31 DIAGNOSIS — L03119 Cellulitis of unspecified part of limb: Secondary | ICD-10-CM | POA: Diagnosis not present

## 2021-12-31 DIAGNOSIS — L03115 Cellulitis of right lower limb: Secondary | ICD-10-CM | POA: Diagnosis not present

## 2021-12-31 DIAGNOSIS — F17200 Nicotine dependence, unspecified, uncomplicated: Secondary | ICD-10-CM | POA: Insufficient documentation

## 2021-12-31 MED ORDER — VANCOMYCIN HCL 125 MG PO CAPS: 125.0000 mg | ORAL_CAPSULE | Freq: Four times a day (QID) | ORAL | 0 refills | Status: AC

## 2021-12-31 MED ORDER — VANCOMYCIN HCL 125 MG PO CAPS: 125.0000 mg | ORAL_CAPSULE | Freq: Every day | ORAL | 0 refills | Status: AC

## 2021-12-31 MED ORDER — VANCOMYCIN HCL 125 MG PO CAPS: 125.0000 mg | ORAL_CAPSULE | Freq: Two times a day (BID) | ORAL | 0 refills | Status: AC

## 2021-12-31 NOTE — Patient Instructions (Signed)
Doxycycline '100mg'$  po bid for 7 days  Vancomycin? in a tapered and pulsed regimen, for example: 125 mg orally 4 times daily for 7 days starting today  125 mg orally 2 times daily for 7 days, then 125 mg orally once daily for 7 days, then 125 mg orally every 2 days thereafter   FU with Vascular FU with PCP for medication management

## 2021-12-31 NOTE — Progress Notes (Addendum)
Patient Active Problem List   Diagnosis Date Noted   Abdominal aortic aneurysm (AAA) 3.0 cm to 5.0 cm in diameter in female ([place]) 12/04/2021   Aortic atherosclerosis ([place]) 12/04/2021   Recurrent Clostridioides difficile diarrhea 12/04/2021   Asymptomatic bacteriuria 12/04/2021   CAD (coronary artery disease) 12/04/2021   Tobacco use 12/04/2021   AKI (acute kidney injury) ([place]) 12/02/2021   Cellulitis of left lower extremity 07/20/2021   Mixed hyperlipidemia 07/20/2021   Major depressive disorder 07/20/2021   Acute renal failure superimposed on stage 3a chronic kidney disease ([place]) 07/20/2021   Hypokalemia 07/20/2021   Malnutrition of moderate degree 07/08/2021   SIRS (systemic inflammatory response syndrome) ([place]) 07/07/2021   Osteopenia 11/30/2015   Anemia of chronic disease 08/31/2015   Breast cancer of upper-inner quadrant of left female breast ([place]) 06/29/2015   Status post mastectomy 01/25/2015   Preoperative clearance 12/15/2014   Right bundle branch block 12/15/2014   Essential hypertension 12/15/2014   Mitral valve prolapse 12/15/2014   Breast cancer of upper-outer quadrant of right female breast ([place]) 09/04/2014   Complex sleep apnea syndrome 11/14/2010    Patient's Medications  New Prescriptions   No medications on file  Previous Medications   ACETAMINOPHEN (TYLENOL) 500 MG TABLET    Take 1,000 mg by mouth every 6 (six) hours as needed (pain).   ALPRAZOLAM (XANAX) 1 MG TABLET    Take 1 mg by mouth 3 (three) times daily as needed for anxiety or sleep (nerves).   AMLODIPINE (NORVASC) 10 MG TABLET    Take 1 tablet (10 mg total) by mouth daily.   ANASTROZOLE (ARIMIDEX) 1 MG TABLET    TAKE 1 TABLET BY MOUTH EVERY DAY   ASCORBIC ACID (VITAMIN C) 1000 MG TABLET    Take 1,000 mg by mouth daily with lunch.   ASPIRIN EC 81 MG TABLET    Take 81 mg by mouth daily. Swallow whole.   B COMPLEX-C (B-COMPLEX WITH VITAMIN C) TABLET    Take 1 tablet by mouth daily.   BREXPIPRAZOLE  (REXULTI) 1 MG TABS TABLET    Take 1 mg by mouth every morning.   CALCIUM CARBONATE (OS-CAL) 600 MG TABS TABLET    Take 600 mg by mouth daily with lunch.   CHOLECALCIFEROL (VITAMIN D) 50 MCG (2000 UT) TABLET    Take 2,000 Units by mouth daily with lunch.   DIFICID 200 MG TABS TABLET    Take 1 tablet (200 mg total) by mouth 2 (two) times daily. Take one dose this evening and then stop   EZETIMIBE (ZETIA) 10 MG TABLET    Take 10 mg by mouth at bedtime.    FERROUS SULFATE 325 (65 FE) MG TABLET    Take 1 tablet (325 mg total) by mouth daily with breakfast.   FLAXSEED, LINSEED, (FLAX SEED OIL) 1000 MG CAPS    Take 1,000 mg by mouth daily with lunch.   GABAPENTIN (NEURONTIN) 600 MG TABLET    Take 1 tablet (600 mg total) by mouth at bedtime for 14 days. Take one tablet (600 mg) by mouth every morning and two tablets (1200 mg) at night   LACTOBACILLUS (PROBIOTIC ACIDOPHILUS PO)    Take 1 tablet by mouth daily with lunch.   LIDOCAINE (LIDODERM) 5 %    Place 1 patch onto the skin at bedtime as needed (pain).   MECLIZINE (ANTIVERT) 25 MG TABLET    Take 25 mg by mouth 4 (four) times daily as needed for dizziness.  NALOXEGOL OXALATE (MOVANTIK) 25 MG TABS TABLET    Take 25 mg by mouth daily as needed (for constipation).   NALOXONE (NARCAN) NASAL SPRAY 4 MG/0.1 ML    Place 1 spray into the nose once as needed (for opioid overdose).   OMEGA-3 FATTY ACIDS (FISH OIL) 1000 MG CAPS    Take 1,000 mg by mouth daily with lunch.   OXYCODONE-ACETAMINOPHEN (PERCOCET) 10-325 MG TABLET    Take 1 tablet by mouth 5 (five) times daily as needed for pain.   POLYETHYLENE GLYCOL (MIRALAX / GLYCOLAX) 17 G PACKET    Take 17 g by mouth daily as needed for mild constipation.   PROAIR HFA 108 (90 BASE) MCG/ACT INHALER    Inhale 2 puffs into the lungs every 4 (four) hours as needed for wheezing or shortness of breath.   ROSUVASTATIN (CRESTOR) 20 MG TABLET    Take 20 mg by mouth at bedtime.   VENLAFAXINE (EFFEXOR-XR) 150 MG 24 HR CAPSULE     Take 150 mg by mouth 2 (two) times daily with breakfast and lunch.  Modified Medications   No medications on file  Discontinued Medications   No medications on file    Subjective: 71 Y O female with PMH as below including chronic LE edema/PVD, LE DVT, chronic fatigue syndrome, chronic back pain, Fibromyalgia, Anxiety/depression, HTN, HLD, MVP, Reflex sympathetic dystrophy, Sjogren's syndrome, Posterior lumbar fusion./spinal cord stimulator, peripheral neuropathy, HTN, HLD, Breast ca s/p mastectomy who is referred for C diff diarrhea.   Patient is accompanied by her husband. Patient is a poor historian and not consistent with the history. Per notes from PCP, she tested positive for C diff on 09/10/21 when she was treated with a 10 days course of fidaxomicin with improvement in diarrhea. This was in the setting of being treated with antibiotics for cellulitis. However, she had recurrence of diarrhea early sept and was started second round of fidaxomicin by PCP. She was then seen in the ED 12/02/2021 for concerns of recurrent C diff diarrhea, AKI where she refused admission/went home and got admitted 9/6-9/9 for diarrhea/dehydration/AKI and hypokalemia. Fidaxomicin was continued inpatient. GI panel was negative C diff was not sent. She was given a dose of fosfomycin in the ED for UTI. Sh was discharged on 9/9 after completing 10 days course. Followed up with PCP 9/12 and was started on PO vancomycin taper. Vancomycin dosing changed from qid dosing to bid dosing on 9/22 as she had improvement in diarrhea. Then, changed to qid dosing last Tuesday  9/26 as she reported worsening in diarrhea. I do not seen any positive C diff test after 09/10/21.   She also complains of swelling in her rt leg for a month now with increasing redness and warmth lately. She has a h/o DVT of b/l LE as well as venous disease of LE. She tells me she has not seen vascular recently but seen in the past. She also tells me that her PCP  has stopped several of his medications including lasix and asking if that is why her rt leg is swollen.   Denies any fevers, chills, sweats.  Denies any nausea, vomiting and abdominal pain. Diarrhea has been improving for a week or so and ranges between 1-6 soft mush stool in a day  Denies chest pain, cough and SOB Denies GU symptoms and rashes   Review of Systems: all systems reviewed with pertinent positives and negatives as listed above  Past Medical History:  Diagnosis Date   Anxiety  takes Xanax daily as needed   Arthritis    Bilateral lower extremity edema    takes Furosemide daily as needed   Breast cancer [facility]) oncologist-  dr Truitt Merle--  right upper-outer quadrant    dx May 2016--- Stage 0  (Tis,N0,M0)  DCIS  Right breast---  09-13-2014  s/p  radioactive seed/ partial mastectomy / sln bx   Chronic fatigue syndrome    Chronic low back pain    Complication of anesthesia    spinal cord stimulator- to be off for surgery   Depression    takes Effexor daily   Family history of adverse reaction to anesthesia    mom hard to wake up   Fibromyalgia    Headache(784.0)    couple of times a week   Heart murmur    History of bronchitis > 56yr ago   History of colon polyps    benign   History of drug overdose    oxycontin  and oxycodone  Sept 2015--  now has narcan injection prescription   History of DVT of lower extremity    2008--  BILATERAL   History of hiatal hernia    HTN (hypertension)    takes Amlodipine and Ramipril daily   Hyperlipidemia    takes Zetia and Crestor  daily   Joint pain    Mitral valve prolapse    MILD /   PER PT ASYMPTOMATIC   Muscle spasm    takes Zanaflex daily as needed   Osteoporosis    Peripheral neuropathy    takes Neurontin daily   Peripheral vascular disease (H[facility])    hx dvt's   RSD (reflex sympathetic dystrophy)    left wrist and forearm from a fx   RSD (reflex sympathetic dystrophy)    Sjogren's disease ([place]    Vertigo    takes  Meclizine daily    Past Surgical History:  Procedure Laterality Date   BACK SURGERY     BREAST RECONSTRUCTION WITH PLACEMENT OF TISSUE EXPANDER AND FLEX HD (ACELLULAR HYDRATED DERMIS) Right 01/25/2015   Procedure: RIGHT BREAST RECONSTRUCTION WITH PLACEMENT OF IMPLANT AND ACELLULAR DERMAL MATRIX ;  Surgeon: DCrissie Reese MD;  [facility]  Service: Plastics;  Laterality: Right;   BREAST RECONSTRUCTION WITH PLACEMENT OF TISSUE EXPANDER AND FLEX HD (ACELLULAR HYDRATED DERMIS) Left 07/05/2015   Procedure: LEFT BREAST RECONSTRUCTION WITH PLACEMENT OF TISSUE EXPANDER AND  ACELLULAR DERMAL MATRIX ;  Surgeon: DCrissie Reese MD;  [facility]  Service: Plastics;  Laterality: Left;   COLONOSCOPY     ESOPHAGOGASTRODUODENOSCOPY     eye lid surgery Bilateral    FOOT SURGERY Bilateral 1996   MORTON'S NEUROMA   INNER EAR SURGERY Right    LUMBAR [place]     MASTECTOMY Right 01/25/2015   nipple sparing    NASAL SINUS SURGERY     NIPPLE SPARING MASTECTOMY/SENTINAL LYMPH NODE BIOPSY/RECONSTRUCTION/PLACEMENT OF TISSUE EXPANDER Left 07/05/2015   Procedure: LEFT NIPPLE SPARING MASTECTOMY WITH SENTINAL LYMPH NODE BIOPSY ;  Surgeon: FStark Klein MD;  [facility]  Service: General;  Laterality: Left;   OPEN REDUCTION INTERNAL FIXATION (ORIF) DISTAL RADIAL FRACTURE Right 08/09/2018   Procedure: OPEN REDUCTION INTERNAL FIXATION (ORIF) DISTAL RADIAL FRACTURE;  Surgeon: TMilly Jakob MD;  [facility]  Service: Orthopedics;  Laterality: Right;   ORIF LEFT WRIST FX'S/  LEFT CARPAL TUNNEL RELEASE  1999   POSTERIOR LUMBAR FUSION  01-30-2009   L4--5 Laminectmy w/ decompression/  bilateral L4--5  microdiskectomy and fusion   RADIOACTIVE SEED GUIDED PARTIAL MASTECTOMY WITH AXILLARY SENTINEL LYMPH NODE BIOPSY Right 09/13/2014   Procedure: RADIOACTIVE SEED GUIDED PARTIAL MASTECTOMY WITH AXILLARY SENTINEL LYMPH NODE BIOPSY;  Surgeon: [name], MD;  Location: [place];   Service: General;  Laterality: Right;   RE-EXCISION OF BREAST LUMPECTOMY Right 09/29/2014   Procedure: RE-EXCISION OF BREAST LUMPECTOMY;  Surgeon: [name], MD;  Location: [place];  Service: General;  Laterality: Right;   RIGHT CARPAL TUNNEL RELEASE/  TRIGGER RELEASE RIGHT MIDDLE FINGER  06-18-2006   RIGHT INFERIOR PARATHYROIDECTOMY  04-07-2006   SIMPLE MASTECTOMY WITH AXILLARY SENTINEL NODE BIOPSY Right 01/25/2015   Procedure: RIGHT NIPPLE [place];  Surgeon: [name], MD;  Location: [place];  Service: General;  Laterality: Right;   SPINAL CORD STIMULATOR IMPLANT  2013   TONSILLECTOMY  as child     Social History   Tobacco Use   Smoking status: Every Day    Packs/day: 0.25    Years: 25.00    Total pack years: 6.25    Types: Cigarettes    Last attempt to quit: 09/29/2014    Years since quitting: 7.2   Smokeless tobacco: Never   Tobacco comments:    trying to quit  Substance Use Topics   Alcohol use: Yes    Alcohol/week: 0.0 standard drinks of alcohol    Comment: occ   Drug use: No    Family History  Problem Relation Age of Onset   Heart murmur Mother    Cancer Mother 20       carcinoid tumor    Hypertension Mother    Heart murmur Sister        x2   Cancer Maternal Aunt        lung cancer   Cancer Paternal Aunt        breast cancer    Cancer Maternal Aunt        gastric cancer    Cancer Cousin        lung cancer   Cancer Cousin        bone cancer    Heart attack Maternal Uncle    Heart attack Paternal Uncle    Hypertension Son    Stroke Neg Hx     Allergies  Allergen Reactions   Carbocaine [Mepivacaine Hcl] Hives and Swelling   Penicillins Nausea And Vomiting, Swelling, Rash and Other (See Comments)    Has patient had a PCN reaction causing immediate rash, facial/tongue/throat swelling, SOB or lightheadedness with hypotension: Yes Has patient had a PCN reaction causing severe rash involving mucus membranes or skin necrosis:  No Has patient had a PCN reaction that required hospitalization No Has patient had a PCN reaction occurring within the last 10 years: Yes If all of the above answers are "NO", then may proceed with Cephalosporin use.   Sulfa Antibiotics Nausea And Vomiting, Swelling, Rash and Other (See Comments)    Headache    Latex Rash and Other (See Comments)    Reaction to gloves and bandaids (paper tape is ok)    Health Maintenance  Topic Date Due   Pneumonia Vaccine 33+ Years old (1 - PCV) 04/07/1956   Hepatitis C Screening  Never done   TETANUS/TDAP  Never done   Zoster Vaccines- Shingrix (1 of 2) Never done   COLONOSCOPY (Pts 45-34yr Insurance coverage will need to be confirmed)  Never done   MAMMOGRAM  Never done   COVID-19 Vaccine (3 -  Pfizer risk series) 06/27/2019   INFLUENZA VACCINE  10/29/2021   DEXA SCAN  Completed   HPV VACCINES  Aged Out    Objective: BP (!) 122/54   Pulse 70   Resp 16   Ht '5\' 2"'$  (1.575 m)   Wt 100 lb (45.4 kg)   SpO2 (!) 70%   BMI 18.29 kg/m    Physical Exam Constitutional:      Appearance: Normal appearance. Thin appearing and malnourished HENT:     Head: Normocephalic and atraumatic.      Mouth: Mucous membranes are moist.  Eyes:    Conjunctiva/sclera: Conjunctivae normal.     Pupils:   Cardiovascular:     Rate and Rhythm: Normal rate and regular rhythm.     Heart sounds:  Pulmonary:     Effort: Pulmonary effort is normal.     Breath sounds: Normal breath sounds.   Abdominal:     General: Non distended     Palpations: soft.   Musculoskeletal:        General: Normal range of motion.   Bilateral leg edema  Rt leg seems to be more swollen, warm and erythematous compared to left  Bilateral calves soft Bilateral ankle and knee ROM intact  Bilateral DP palpable     Skin:    General: Skin is warm and dry.     Comments:  Neurological:     General: grossly non focal     Mental Status: awake, alert and oriented to person, place,  and time.   Psychiatric:        Mood and Affect: Mood normal.   Lab Results Lab Results  Component Value Date   WBC 6.7 12/05/2021   HGB 10.7 (L) 12/05/2021   HCT 32.7 (L) 12/05/2021   MCV 94.2 12/05/2021   PLT 215 12/05/2021    Lab Results  Component Value Date   CREATININE 1.74 (H) 12/07/2021   BUN 14 12/07/2021   NA 145 12/07/2021   K 3.5 12/07/2021   CL 116 (H) 12/07/2021   CO2 23 12/07/2021    Lab Results  Component Value Date   ALT 19 12/05/2021   AST 26 12/05/2021   ALKPHOS 53 12/05/2021   BILITOT 0.4 12/05/2021    No results found for: "CHOL", "HDL", "LDLCALC", "LDLDIRECT", "TRIG", "CHOLHDL" No results found for: "LABRPR", "RPRTITER" No results found for: "HIV1RNAQUANT", "HIV1RNAVL", "CD4TABS"   Outside labs 9/12 CBC unremarkable  9/18 BMP Cr 2.45 , PCP following  Assessment/Plan # Presumed recurrence of C diff diarrhea  09/10/21 C diff toxin gene NAA positive. She has completed 2 courses of Fidaxomicin since then followed by prolonged PO Vancomycin course She never had C diff test done after her positive test in June and has been treated presumptively as recurrence of C diff diarrhea. GI panel 9/6 negative. Not clear if this is second episode of C diff.  She was started on vancomycin 125 mg po qid by PCP 9/26 and will plan to complete 14 days course followed by BID dosing for 7 days and then daily for 7 days, then every 2-3 days subsequently.  I will reorder C diff test with GDH ag and toxin, CBC and BMP today  Fu in 2 weeks   # RT leg cellulitis  in the setting of chronic venous edema of LE and venous disease Her rt leg seems more red and warm compared to left. Given her C diff h/o, will need to be judicious about abtx use  Cefadroxil  $'500mg'Z$  po bid for 7 days. She has tolerated cefadroxil in the past without any issues. CBC  Xray rt leg  Keep legs elevated  Fu with Vascular   # Smoking  - needs to quit  I have personally spent 65 minutes involved in  face-to-face and non-face-to-face activities for this patient on the day of the visit. Professional time spent includes the following activities: Preparing to see the patient (review of tests), Obtaining and/or reviewing separately obtained history (admission/discharge record), Performing a medically appropriate examination and/or evaluation , Ordering medications/tests/procedures, referring and communicating with other health care professionals, Documenting clinical information in the EMR, Independently interpreting results (not separately reported), Communicating results to the patient/family/caregiver, Counseling and educating the patient/family/caregiver and Care coordination (not separately reported).   Wilber Oliphant, [facility] 12/31/2021, 7:58 AM

## 2022-01-01 LAB — CBC
HCT: 27.5 % — ABNORMAL LOW (ref 35.0–45.0)
Hemoglobin: 9 g/dL — ABNORMAL LOW (ref 11.7–15.5)
MCH: 30.9 pg (ref 27.0–33.0)
MCHC: 32.7 g/dL (ref 32.0–36.0)
MCV: 94.5 fL (ref 80.0–100.0)
MPV: 10.5 fL (ref 7.5–12.5)
Platelets: 338 10*3/uL (ref 140–400)
RBC: 2.91 10*6/uL — ABNORMAL LOW (ref 3.80–5.10)
RDW: 12.4 % (ref 11.0–15.0)
WBC: 6.4 10*3/uL (ref 3.8–10.8)

## 2022-01-01 LAB — BASIC METABOLIC PANEL
BUN/Creatinine Ratio: 12 (calc) (ref 6–22)
BUN: 19 mg/dL (ref 7–25)
CO2: 32 mmol/L (ref 20–32)
Calcium: 9.2 mg/dL (ref 8.6–10.4)
Chloride: 105 mmol/L (ref 98–110)
Creat: 1.54 mg/dL — ABNORMAL HIGH (ref 0.60–1.00)
Glucose, Bld: 82 mg/dL (ref 65–99)
Potassium: 3.9 mmol/L (ref 3.5–5.3)
Sodium: 143 mmol/L (ref 135–146)

## 2022-01-01 MED ORDER — DOXYCYCLINE HYCLATE 100 MG PO TABS: 100.0000 mg | ORAL_TABLET | Freq: Two times a day (BID) | ORAL | 0 refills | Status: DC

## 2022-01-01 MED ORDER — CEFADROXIL 500 MG PO CAPS: 500.0000 mg | ORAL_CAPSULE | Freq: Two times a day (BID) | ORAL | 0 refills | Status: DC

## 2022-01-02 ENCOUNTER — Other Ambulatory Visit (HOSPITAL_COMMUNITY): Payer: Self-pay

## 2022-01-02 ENCOUNTER — Encounter: Payer: Self-pay | Admitting: Hematology

## 2022-01-08 ENCOUNTER — Other Ambulatory Visit: Payer: Self-pay

## 2022-01-08 ENCOUNTER — Other Ambulatory Visit: Payer: PPO

## 2022-01-08 DIAGNOSIS — A0472 Enterocolitis due to Clostridium difficile, not specified as recurrent: Secondary | ICD-10-CM

## 2022-01-09 LAB — C. DIFFICILE GDH AND TOXIN A/B
GDH ANTIGEN: NOT DETECTED
MICRO NUMBER:: 14042295
SPECIMEN QUALITY:: ADEQUATE
TOXIN A AND B: NOT DETECTED

## 2022-01-10 ENCOUNTER — Other Ambulatory Visit: Payer: Self-pay

## 2022-01-10 ENCOUNTER — Ambulatory Visit (INDEPENDENT_AMBULATORY_CARE_PROVIDER_SITE_OTHER): Payer: PPO | Admitting: Internal Medicine

## 2022-01-10 ENCOUNTER — Encounter: Payer: Self-pay | Admitting: Internal Medicine

## 2022-01-10 VITALS — BP 140/62 | HR 69 | Temp 97.9°F | Wt 111.0 lb

## 2022-01-10 DIAGNOSIS — M79604 Pain in right leg: Secondary | ICD-10-CM | POA: Diagnosis not present

## 2022-01-10 DIAGNOSIS — M79605 Pain in left leg: Secondary | ICD-10-CM

## 2022-01-10 NOTE — Progress Notes (Unsigned)
Patient Active Problem List   Diagnosis Date Noted   C. difficile diarrhea 12/31/2021   Medication monitoring encounter 12/31/2021   Recurrent cellulitis of lower extremity 12/31/2021   Smoking 12/31/2021   Abdominal aortic aneurysm (AAA) 3.0 cm to 5.0 cm in diameter in female ([place]) 12/04/2021   Aortic atherosclerosis ([place]) 12/04/2021   Recurrent Clostridioides difficile diarrhea 12/04/2021   Asymptomatic bacteriuria 12/04/2021   CAD (coronary artery disease) 12/04/2021   Tobacco use 12/04/2021   AKI (acute kidney injury) ([place]) 12/02/2021   Cellulitis of left lower extremity 07/20/2021   Mixed hyperlipidemia 07/20/2021   Major depressive disorder 07/20/2021   Acute renal failure superimposed on stage 3a chronic kidney disease ([place]) 07/20/2021   Hypokalemia 07/20/2021   Malnutrition of moderate degree 07/08/2021   SIRS (systemic inflammatory response syndrome) ([place]) 07/07/2021   Osteopenia 11/30/2015   Anemia of chronic disease 08/31/2015   Breast cancer of upper-inner quadrant of left female breast ([place]) 06/29/2015   Status post mastectomy 01/25/2015   Preoperative clearance 12/15/2014   Right bundle branch block 12/15/2014   Essential hypertension 12/15/2014   Mitral valve prolapse 12/15/2014   Breast cancer of upper-outer quadrant of right female breast ([place]) 09/04/2014   Complex sleep apnea syndrome 11/14/2010    Patient's Medications  New Prescriptions   No medications on file  Previous Medications   ACETAMINOPHEN (TYLENOL) 500 MG TABLET    Take 1,000 mg by mouth every 6 (six) hours as needed (pain).   ALPRAZOLAM (XANAX) 1 MG TABLET    Take 1 mg by mouth 3 (three) times daily as needed for anxiety or sleep (nerves).   AMLODIPINE (NORVASC) 10 MG TABLET    Take 1 tablet (10 mg total) by mouth daily.   ANASTROZOLE (ARIMIDEX) 1 MG TABLET    TAKE 1 TABLET BY MOUTH EVERY DAY   ASCORBIC ACID (VITAMIN C) 1000 MG TABLET    Take 1,000 mg by mouth daily with lunch.    ASPIRIN EC 81 MG TABLET    Take 81 mg by mouth daily. Swallow whole.   B COMPLEX-C (B-COMPLEX WITH VITAMIN C) TABLET    Take 1 tablet by mouth daily.   BREXPIPRAZOLE (REXULTI) 1 MG TABS TABLET    Take 1 mg by mouth every morning.   CALCIUM CARBONATE (OS-CAL) 600 MG TABS TABLET    Take 600 mg by mouth daily with lunch.   CEFADROXIL (DURICEF) 500 MG CAPSULE    Take 1 capsule (500 mg total) by mouth 2 (two) times daily.   CHOLECALCIFEROL (VITAMIN D) 50 MCG (2000 UT) TABLET    Take 2,000 Units by mouth daily with lunch.   EZETIMIBE (ZETIA) 10 MG TABLET    Take 10 mg by mouth at bedtime.    FERROUS SULFATE 325 (65 FE) MG TABLET    Take 1 tablet (325 mg total) by mouth daily with breakfast.   FLAXSEED, LINSEED, (FLAX SEED OIL) 1000 MG CAPS    Take 1,000 mg by mouth daily with lunch.   GABAPENTIN (NEURONTIN) 600 MG TABLET    Take 1 tablet (600 mg total) by mouth at bedtime for 14 days. Take one tablet (600 mg) by mouth every morning and two tablets (1200 mg) at night   LACTOBACILLUS (PROBIOTIC ACIDOPHILUS PO)    Take 1 tablet by mouth daily with lunch.   LIDOCAINE (LIDODERM) 5 %    Place 1 patch onto the skin at bedtime as needed (pain).  MECLIZINE (ANTIVERT) 25 MG TABLET    Take 25 mg by mouth 4 (four) times daily as needed for dizziness.   NALOXEGOL OXALATE (MOVANTIK) 25 MG TABS TABLET    Take 25 mg by mouth daily as needed (for constipation).   NALOXONE (NARCAN) NASAL SPRAY 4 MG/0.1 ML    Place 1 spray into the nose once as needed (for opioid overdose).   OMEGA-3 FATTY ACIDS (FISH OIL) 1000 MG CAPS    Take 1,000 mg by mouth daily with lunch.   OXYCODONE-ACETAMINOPHEN (PERCOCET) 10-325 MG TABLET    Take 1 tablet by mouth 5 (five) times daily as needed for pain.   POLYETHYLENE GLYCOL (MIRALAX / GLYCOLAX) 17 G PACKET    Take 17 g by mouth daily as needed for mild constipation.   PROAIR HFA 108 (90 BASE) MCG/ACT INHALER    Inhale 2 puffs into the lungs every 4 (four) hours as needed for wheezing or  shortness of breath.   ROSUVASTATIN (CRESTOR) 20 MG TABLET    Take 20 mg by mouth at bedtime.   VANCOMYCIN (VANCOCIN) 125 MG CAPSULE    Take 125 mg by mouth 4 (four) times daily.   VANCOMYCIN (VANCOCIN) 125 MG CAPSULE    Take 1 capsule (125 mg total) by mouth in the morning and at bedtime for 7 days.   VANCOMYCIN (VANCOCIN) 125 MG CAPSULE    Take 1 capsule (125 mg total) by mouth daily for 7 days.   VENLAFAXINE (EFFEXOR-XR) 150 MG 24 HR CAPSULE    Take 150 mg by mouth 2 (two) times daily with breakfast and lunch.  Modified Medications   No medications on file  Discontinued Medications   No medications on file    Subjective: 71 year old female with past medical history as below including lower extremity edema/PVD, lower extremity DVT, chronic fatigue syndrome, fibromyalgia, Sjogren's syndrome, breast cancer s/p mastectomy presents for management of recurrent C. difficile diarrhea.   She was last seen by Dr. Adrienne Mocha on 12/31/2021, noted that she was tested positive for C. difficile on 09/10/2021 and treated with fidaxomicin's x10 days with improvement in diarrhea.  She had received antibiotics for cellulitis.  She was started on second round of fidaxomicin by PCP in September due to recurrence of diarrhea.  She was admitted 9/6-9 for dehydration/diarrhea/AKI and hypokalemia.  Fidaxomicin was continued.  GI panel for C. difficile was not sent.  Follow-up by PCP on 9/12 and started on p.o. vancomycin taper.  Dosing changed from 4 times daily to twice daily on 9/22 due to improving diarrhea.  This was later changed to 4 times daily dosing 9/26 we started on 14-day course with a taper. Today 10/13: she reports diarrhea has improved but not resolved. She is concerned that her feet still hurt. Denies fevers and chills.  Review of Systems: Review of Systems  All other systems reviewed and are negative.   Past Medical History:  Diagnosis Date   Anxiety    takes Xanax daily as needed   Arthritis     Bilateral lower extremity edema    takes Furosemide daily as needed   Breast cancer [facility]) oncologist-  dr Truitt Merle--  right upper-outer quadrant    dx May 2016--- Stage 0  (Tis,N0,M0)  DCIS  Right breast---  09-13-2014  s/p  radioactive seed/ partial mastectomy / sln bx   Chronic fatigue syndrome    Chronic low back pain    Complication of anesthesia    spinal cord stimulator- to be  off for surgery   Depression    takes Effexor daily   Family history of adverse reaction to anesthesia    mom hard to wake up   Fibromyalgia    Headache(784.0)    couple of times a week   Heart murmur    History of bronchitis > 42yr ago   History of colon polyps    benign   History of drug overdose    oxycontin  and oxycodone  Sept 2015--  now has narcan injection prescription   History of DVT of lower extremity    2008--  BILATERAL   History of hiatal hernia    HTN (hypertension)    takes Amlodipine and Ramipril daily   Hyperlipidemia    takes Zetia and Crestor  daily   Joint pain    Mitral valve prolapse    MILD /   PER PT ASYMPTOMATIC   Muscle spasm    takes Zanaflex daily as needed   Osteoporosis    Peripheral neuropathy    takes Neurontin daily   Peripheral vascular disease (HCC)    hx dvt's   RSD (reflex sympathetic dystrophy)    left wrist and forearm from a fx   RSD (reflex sympathetic dystrophy)    Sjogren's disease (HCC)    Vertigo    takes Meclizine daily     Social History   Tobacco Use   Smoking status: Every Day    Packs/day: 0.25    Years: 25.00    Total pack years: 6.25    Types: Cigarettes    Last attempt to quit: 09/29/2014    Years since quitting: 7.2   Smokeless tobacco: Never   Tobacco comments:    trying to quit  Substance Use Topics   Alcohol use: Yes    Alcohol/week: 0.0 standard drinks of alcohol    Comment: occ   Drug use: No    Family History  Problem Relation Age of Onset   Heart murmur Mother    Cancer Mother 650      carcinoid tumor     Hypertension Mother    Heart murmur Sister        x2   Cancer Maternal Aunt        lung cancer   Cancer Paternal Aunt        breast cancer    Cancer Maternal Aunt        gastric cancer    Cancer Cousin        lung cancer   Cancer Cousin        bone cancer    Heart attack Maternal Uncle    Heart attack Paternal Uncle    Hypertension Son    Stroke Neg Hx     Allergies  Allergen Reactions   Carbocaine [Mepivacaine Hcl] Hives and Swelling   Penicillins Nausea And Vomiting, Swelling, Rash and Other (See Comments)    Has patient had a PCN reaction causing immediate rash, facial/tongue/throat swelling, SOB or lightheadedness with hypotension: Yes Has patient had a PCN reaction causing severe rash involving mucus membranes or skin necrosis: No Has patient had a PCN reaction that required hospitalization No Has patient had a PCN reaction occurring within the last 10 years: Yes If all of the above answers are "NO", then may proceed with Cephalosporin use.   Sulfa Antibiotics Nausea And Vomiting, Swelling, Rash and Other (See Comments)    Headache    Latex Rash and Other (See Comments)  Reaction to gloves and bandaids (paper tape is ok)    Health Maintenance  Topic Date Due   Pneumonia Vaccine 34+ Years old (1 - PCV) 04/07/1956   Hepatitis C Screening  Never done   TETANUS/TDAP  Never done   Zoster Vaccines- Shingrix (1 of 2) Never done   COLONOSCOPY (Pts 45-21yr Insurance coverage will need to be confirmed)  Never done   MAMMOGRAM  Never done   COVID-19 Vaccine (3 - Pfizer risk series) 06/27/2019   INFLUENZA VACCINE  10/29/2021   DEXA SCAN  Completed   HPV VACCINES  Aged Out    Objective:  There were no vitals filed for this visit. There is no height or weight on file to calculate BMI.  Physical Exam Constitutional:      Appearance: Normal appearance.  HENT:     Head: Normocephalic and atraumatic.     Right Ear: Tympanic membrane normal.     Left Ear: Tympanic  membrane normal.     Nose: Nose normal.     Mouth/Throat:     Mouth: Mucous membranes are moist.  Eyes:     Extraocular Movements: Extraocular movements intact.     Conjunctiva/sclera: Conjunctivae normal.     Pupils: Pupils are equal, round, and reactive to light.  Cardiovascular:     Rate and Rhythm: Normal rate and regular rhythm.     Heart sounds: No murmur heard.    No friction rub. No gallop.  Pulmonary:     Effort: Pulmonary effort is normal.     Breath sounds: Normal breath sounds.  Abdominal:     General: Abdomen is flat.     Palpations: Abdomen is soft.  Musculoskeletal:        General: Normal range of motion.  Skin:    General: Skin is warm and dry.  Neurological:     General: No focal deficit present.     Mental Status: She is alert and oriented to person, place, and time.  Psychiatric:        Mood and Affect: Mood normal.        Lab Results Lab Results  Component Value Date   WBC 6.4 12/31/2021   HGB 9.0 (L) 12/31/2021   HCT 27.5 (L) 12/31/2021   MCV 94.5 12/31/2021   PLT 338 12/31/2021    Lab Results  Component Value Date   CREATININE 1.54 (H) 12/31/2021   BUN 19 12/31/2021   NA 143 12/31/2021   K 3.9 12/31/2021   CL 105 12/31/2021   CO2 32 12/31/2021    Lab Results  Component Value Date   ALT 19 12/05/2021   AST 26 12/05/2021   ALKPHOS 53 12/05/2021   BILITOT 0.4 12/05/2021    No results found for: "CHOL", "HDL", "LDLCALC", "LDLDIRECT", "TRIG", "CHOLHDL" No results found for: "LABRPR", "RPRTITER" No results found for: "HIV1RNAQUANT", "HIV1RNAVL", "CD4TABS"   Assessment/Plan #Hx of C diff diarrhea with c/f recurrence -The only reported testing for C. difficile prior to 10/11 was 6/13.  She has been on vancomycin essentially since 9/12.  As such Cdiff AG/toxin is of unclear significance. -Diarrhea better, but not completely gone. Plan: -Counseled to complete vanc course and f/u with ID in 2 weeks -If patient was to have a recurrent  episode of C. difficile, I think would be prudent to get C. difficile testing prior to re-starting antibiotic course.  #RLE cellulitis-resolved -Pt is concerned that there is still some mild erythema present and pain continues. I counseled pt that  complete resolution of erythema may take weeks and cellulitis appears resolved.  -Follow-up in 2 weeks with Dr. West Bali  #B/L LE pain in the setting of mild PAD #Tobacco abuse -Vascular US showed mild right and left LE disease. I referred her to vascular surgery for management and counseled he ron smoking cessation.   Laurice Record, MD [facility] 01/10/2022, 11:08 AM   I have personally spent 67 minutes involved in face-to-face and non-face-to-face activities for this patient on the day of the visit. Professional time spent includes the following activities: Preparing to see the patient (review of tests), Obtaining and/or reviewing separately obtained history (admission/discharge record), Performing a medically appropriate examination and/or evaluation , Ordering medications/tests/procedures, referring and communicating with other health care professionals, Documenting clinical information in the EMR, Independently interpreting results (not separately reported), Communicating results to the patient/family/caregiver, Counseling and educating the patient/family/caregiver and Care coordination (not separately reported).

## 2022-01-15 DIAGNOSIS — M545 Low back pain, unspecified: Secondary | ICD-10-CM | POA: Diagnosis not present

## 2022-01-15 DIAGNOSIS — G8929 Other chronic pain: Secondary | ICD-10-CM | POA: Diagnosis not present

## 2022-01-15 DIAGNOSIS — M533 Sacrococcygeal disorders, not elsewhere classified: Secondary | ICD-10-CM | POA: Diagnosis not present

## 2022-01-23 ENCOUNTER — Ambulatory Visit (INDEPENDENT_AMBULATORY_CARE_PROVIDER_SITE_OTHER): Payer: PPO | Admitting: Vascular Surgery

## 2022-01-23 ENCOUNTER — Encounter: Payer: Self-pay | Admitting: Vascular Surgery

## 2022-01-23 ENCOUNTER — Other Ambulatory Visit (HOSPITAL_COMMUNITY): Payer: Self-pay | Admitting: Vascular Surgery

## 2022-01-23 ENCOUNTER — Ambulatory Visit (HOSPITAL_COMMUNITY)
Admission: RE | Admit: 2022-01-23 | Discharge: 2022-01-23 | Disposition: A | Discharge status: Home / Self Care | Payer: PPO | Source: Ambulatory Visit | Attending: Vascular Surgery | Admitting: Vascular Surgery

## 2022-01-23 VITALS — BP 131/72 | HR 66 | Temp 97.8°F | Resp 20 | Ht 62.0 in | Wt 108.0 lb

## 2022-01-23 DIAGNOSIS — I872 Venous insufficiency (chronic) (peripheral): Secondary | ICD-10-CM

## 2022-01-23 DIAGNOSIS — R609 Edema, unspecified: Secondary | ICD-10-CM | POA: Diagnosis not present

## 2022-01-23 DIAGNOSIS — I739 Peripheral vascular disease, unspecified: Secondary | ICD-10-CM | POA: Diagnosis not present

## 2022-01-23 DIAGNOSIS — I7143 Infrarenal abdominal aortic aneurysm, without rupture: Secondary | ICD-10-CM

## 2022-01-23 NOTE — Progress Notes (Signed)
ASSESSMENT & PLAN   PERIPHERAL ARTERIAL DISEASE: The patient may have some mild underlying infrainguinal arterial occlusive disease but has palpable dorsalis pedis pulses and brisk Doppler signals in both feet.  Thus I am not convinced that the leg pain can be attributed to peripheral arterial disease.  She does smoke a half a pack per day and we discussed the importance of tobacco cessation.  I encouraged her to walk is much as possible.  CHRONIC VENOUS INSUFFICIENCY: On exam she does have evidence of chronic venous insufficiency.  We have discussed the importance of leg elevation and compression therapy.  I have encouraged her to avoid prolonged sitting and standing.  We discussed the importance of exercise.  Of note did obtain a venous duplex scan given her right leg swelling to rule out DVT.  This did not show any evidence of DVT in the right lower extremity.  ABDOMINAL AORTIC ANEURYSM: The patient had a renal stone study in September of this year which showed a 3.1 cm infrarenal abdominal aortic aneurysm.  This is a very small aneurysm but I would recommend a follow-up ultrasound in 2 years and I have ordered that test.  We will see her back at that time.  Again tobacco cessation and blood pressure control are important with respect to lowering the risk of this enlarging.  REASON FOR CONSULT:    Bilateral leg pain.  The consult is requested by Dr. Candiss Norse.  HPI:   Connie Bennett is a 71 y.o. female who was referred with leg pain.  She tells me she had pain in her right leg for about a month and a half.  It is mostly in the lower part of her leg.  She tells me she has had 2 previous DVTs in the past but cannot remember which leg it was.  She thinks it was on the left.  I do not get any clear-cut history of claudication.  She denies any history of rest pain or nonhealing ulcers.  She denies any significant abdominal pain or back pain.  There is no family history of aneurysmal disease.  Past  Medical History:  Diagnosis Date   Anxiety    takes Xanax daily as needed   Arthritis    Bilateral lower extremity edema    takes Furosemide daily as needed   Breast cancer [facility]) oncologist-  dr Truitt Merle--  right upper-outer quadrant    dx May 2016--- Stage 0  (Tis,N0,M0)  DCIS  Right breast---  09-13-2014  s/p  radioactive seed/ partial mastectomy / sln bx   Chronic fatigue syndrome    Chronic low back pain    Complication of anesthesia    spinal cord stimulator- to be off for surgery   Depression    takes Effexor daily   Family history of adverse reaction to anesthesia    mom hard to wake up   Fibromyalgia    Headache(784.0)    couple of times a week   Heart murmur    History of bronchitis > 4yr ago   History of colon polyps    benign   History of drug overdose    oxycontin  and oxycodone  Sept 2015--  now has narcan injection prescription   History of DVT of lower extremity    2008--  BILATERAL   History of hiatal hernia    HTN (hypertension)    takes Amlodipine and Ramipril daily   Hyperlipidemia    takes Zetia and Crestor  daily   Joint pain    Mitral valve prolapse    MILD /   PER PT ASYMPTOMATIC   Muscle spasm    takes Zanaflex daily as needed   Osteoporosis    Peripheral neuropathy    takes Neurontin daily   Peripheral vascular disease (HCC)    hx dvt's   RSD (reflex sympathetic dystrophy)    left wrist and forearm from a fx   RSD (reflex sympathetic dystrophy)    Sjogren's disease ([place])    Vertigo    takes Meclizine daily     Family History  Problem Relation Age of Onset   Heart murmur Mother    Cancer Mother 54       carcinoid tumor    Hypertension Mother    Heart murmur Sister        x2   Cancer Maternal Aunt        lung cancer   Cancer Paternal Aunt        breast cancer    Cancer Maternal Aunt        gastric cancer    Cancer Cousin        lung cancer   Cancer Cousin        bone cancer    Heart attack Maternal Uncle    Heart attack  Paternal Uncle    Hypertension Son    Stroke Neg Hx     SOCIAL HISTORY: Social History   Tobacco Use   Smoking status: Every Day    Packs/day: 0.50    Years: 25.00    Total pack years: 12.50    Types: Cigarettes    Last attempt to quit: 09/29/2014    Years since quitting: 7.3   Smokeless tobacco: Never   Tobacco comments:    trying to quit  Substance Use Topics   Alcohol use: Yes    Alcohol/week: 0.0 standard drinks of alcohol    Comment: occ    Allergies  Allergen Reactions   Carbocaine [Mepivacaine Hcl] Hives and Swelling   Penicillins Nausea And Vomiting, Swelling, Rash and Other (See Comments)    Has patient had a PCN reaction causing immediate rash, facial/tongue/throat swelling, SOB or lightheadedness with hypotension: Yes Has patient had a PCN reaction causing severe rash involving mucus membranes or skin necrosis: No Has patient had a PCN reaction that required hospitalization No Has patient had a PCN reaction occurring within the last 10 years: Yes If all of the above answers are "NO", then may proceed with Cephalosporin use.   Sulfa Antibiotics Nausea And Vomiting, Swelling, Rash and Other (See Comments)    Headache    Latex Rash and Other (See Comments)    Reaction to gloves and bandaids (paper tape is ok)    Current Outpatient Medications  Medication Sig Dispense Refill   acetaminophen (TYLENOL) 500 MG tablet Take 1,000 mg by mouth every 6 (six) hours as needed (pain).     ALPRAZolam (XANAX) 1 MG tablet Take 1 mg by mouth 3 (three) times daily as needed for anxiety or sleep (nerves).     amLODipine (NORVASC) 10 MG tablet Take 1 tablet (10 mg total) by mouth daily. 30 tablet 1   anastrozole (ARIMIDEX) 1 MG tablet TAKE 1 TABLET BY MOUTH EVERY DAY (Patient taking differently: Take 1 mg by mouth at bedtime.) 90 tablet 3   Ascorbic Acid (VITAMIN C) 1000 MG tablet Take 1,000 mg by mouth daily with lunch.     aspirin EC 81  MG tablet Take 81 mg by mouth daily.  Swallow whole.     B Complex-C (B-COMPLEX WITH VITAMIN C) tablet Take 1 tablet by mouth daily. 120 tablet 0   brexpiprazole (REXULTI) 1 MG TABS tablet Take 1 mg by mouth every morning.     calcium carbonate (OS-CAL) 600 MG TABS tablet Take 600 mg by mouth daily with lunch.     cefadroxil (DURICEF) 500 MG capsule Take 1 capsule (500 mg total) by mouth 2 (two) times daily. 14 capsule 0   Cholecalciferol (VITAMIN D) 50 MCG (2000 UT) tablet Take 2,000 Units by mouth daily with lunch.     ezetimibe (ZETIA) 10 MG tablet Take 10 mg by mouth at bedtime.      ferrous sulfate 325 (65 FE) MG tablet Take 1 tablet (325 mg total) by mouth daily with breakfast. 60 tablet 0   Flaxseed, Linseed, (FLAX SEED OIL) 1000 MG CAPS Take 1,000 mg by mouth daily with lunch.     gabapentin (NEURONTIN) 600 MG tablet Take 1 tablet (600 mg total) by mouth at bedtime for 14 days. Take one tablet (600 mg) by mouth every morning and two tablets (1200 mg) at night (Patient taking differently: Take 600-1,200 mg by mouth See admin instructions. Take one tablet (600 mg) by mouth every morning, one tablet (600 mg) and two tablets (1200 mg) at night) 14 tablet 0   Lactobacillus (PROBIOTIC ACIDOPHILUS PO) Take 1 tablet by mouth daily with lunch.     lidocaine (LIDODERM) 5 % Place 1 patch onto the skin at bedtime as needed (pain).     meclizine (ANTIVERT) 25 MG tablet Take 25 mg by mouth 4 (four) times daily as needed for dizziness.     naloxegol oxalate (MOVANTIK) 25 MG TABS tablet Take 25 mg by mouth daily as needed (for constipation).     naloxone (NARCAN) nasal spray 4 mg/0.1 mL Place 1 spray into the nose once as needed (for opioid overdose).     Omega-3 Fatty Acids (FISH OIL) 1000 MG CAPS Take 1,000 mg by mouth daily with lunch.     oxyCODONE-acetaminophen (PERCOCET) 10-325 MG tablet Take 1 tablet by mouth 5 (five) times daily as needed for pain.     polyethylene glycol (MIRALAX / GLYCOLAX) 17 g packet Take 17 g by mouth daily as  needed for mild constipation. 14 each 0   PROAIR HFA 108 (90 Base) MCG/ACT inhaler Inhale 2 puffs into the lungs every 4 (four) hours as needed for wheezing or shortness of breath.  2   rosuvastatin (CRESTOR) 20 MG tablet Take 20 mg by mouth at bedtime.     vancomycin (VANCOCIN) 125 MG capsule Take 125 mg by mouth 4 (four) times daily.     vancomycin (VANCOCIN) 125 MG capsule Take 1 capsule (125 mg total) by mouth daily for 7 days. 7 capsule 0   venlafaxine (EFFEXOR-XR) 150 MG 24 hr capsule Take 150 mg by mouth 2 (two) times daily with breakfast and lunch.     No current facility-administered medications for this visit.    REVIEW OF SYSTEMS:  '[X]'$  denotes positive finding, '[ ]'$  denotes negative finding Cardiac  Comments:  Chest pain or chest pressure:    Shortness of breath upon exertion:    Short of breath when lying flat:    Irregular heart rhythm:        Vascular    Pain in calf, thigh, or hip brought on by ambulation:    Pain in feet  at night that wakes you up from your sleep:  x   Blood clot in your veins: x   Leg swelling:  x       Pulmonary    Oxygen at home:    Productive cough:     Wheezing:         Neurologic    Sudden weakness in arms or legs:     Sudden numbness in arms or legs:     Sudden onset of difficulty speaking or slurred speech:    Temporary loss of vision in one eye:     Problems with dizziness:  x       Gastrointestinal    Blood in stool:     Vomited blood:         Genitourinary    Burning when urinating:     Blood in urine:        Psychiatric    Major depression:         Hematologic    Bleeding problems:    Problems with blood clotting too easily:        Skin    Rashes or ulcers:        Constitutional    Fever or chills:    -  PHYSICAL EXAM:   Vitals:   01/23/22 1354  BP: 131/72  Pulse: 66  Resp: 20  Temp: 97.8 F (36.6 C)  SpO2: 98%  Weight: 108 lb (49 kg)  Height: '5\' 2"'$  (1.575 m)   Body mass index is 19.75 kg/m. GENERAL:  The patient is a well-nourished female, in no acute distress. The vital signs are documented above. CARDIAC: There is a regular rate and rhythm.  VASCULAR: I do not detect carotid bruits. On the right side she has a palpable femoral pulse and popliteal pulse and dorsalis pedis pulse.  I cannot palpate a posterior tibial pulse.  She has a brisk but monophasic dorsalis pedis and posterior tibial signal with the Doppler on the right. On the left side she has a palpable femoral pulse and a diminished popliteal pulse.  I cannot palpate a posterior tibial pulse.  She does have a palpable dorsalis pedis pulse.  She has a brisk but monophasic dorsalis pedis and posterior tibial signal with the Doppler on the left. She has bilateral lower extremity swelling which is more significant on the right side.  She has some hyperpigmentation bilaterally and some lipodermatosclerosis on the left consistent with chronic venous insufficiency. PULMONARY: There is good air exchange bilaterally without wheezing or rales. ABDOMEN: Soft and non-tender with normal pitched bowel sounds.  Aorta is palpable and nontender. MUSCULOSKELETAL: There are no major deformities. NEUROLOGIC: No focal weakness or paresthesias are detected. SKIN: There are no ulcers or rashes noted. PSYCHIATRIC: The patient has a normal affect.  DATA:    ARTERIAL DOPPLER STUDY: I have reviewed the arterial Doppler study that was done on 08/15/2021.  On the right side there was a monophasic posterior tibial signal with a biphasic dorsalis pedis signal.  ABI was 85%.  Toe pressure on the right was 162 mmHg.  On the left side there was a monophasic posterior tibial signal with a biphasic dorsalis pedis signal.  ABI was 90%.  Toe pressure was 82 mmHg.  VENOUS DUPLEX: The patient was complaining of right leg swelling and right leg pain so I did obtain a venous duplex scan today.  I have independently interpreted the study.  This did not show any evidence of  DVT in  the right lower extremity.  CT RENAL STONE STUDY:  The patient had a renal stone study on 12/03/2021 which did not show any significant intra-abdominal pathology except for an incidental finding of a 3.1 cm infrarenal abdominal aortic aneurysm.  Deitra Mayo [facility]

## 2022-01-28 ENCOUNTER — Ambulatory Visit (INDEPENDENT_AMBULATORY_CARE_PROVIDER_SITE_OTHER): Payer: PPO | Admitting: Infectious Diseases

## 2022-01-28 ENCOUNTER — Other Ambulatory Visit: Payer: Self-pay

## 2022-01-28 ENCOUNTER — Encounter: Payer: Self-pay | Admitting: Infectious Diseases

## 2022-01-28 VITALS — BP 146/81 | HR 71 | Resp 16 | Ht 62.0 in | Wt 111.0 lb

## 2022-01-28 DIAGNOSIS — Z5181 Encounter for therapeutic drug level monitoring: Secondary | ICD-10-CM | POA: Diagnosis not present

## 2022-01-28 DIAGNOSIS — I872 Venous insufficiency (chronic) (peripheral): Secondary | ICD-10-CM | POA: Diagnosis not present

## 2022-01-28 DIAGNOSIS — A0472 Enterocolitis due to Clostridium difficile, not specified as recurrent: Secondary | ICD-10-CM | POA: Diagnosis not present

## 2022-01-28 DIAGNOSIS — I739 Peripheral vascular disease, unspecified: Secondary | ICD-10-CM

## 2022-01-28 MED ORDER — DIFICID 200 MG PO TABS: 200.0000 mg | ORAL_TABLET | Freq: Two times a day (BID) | ORAL | 0 refills | Status: AC

## 2022-01-28 NOTE — Progress Notes (Unsigned)
Patient Active Problem List   Diagnosis Date Noted   C. difficile diarrhea 12/31/2021   Medication monitoring encounter 12/31/2021   Recurrent cellulitis of lower extremity 12/31/2021   Smoking 12/31/2021   Abdominal aortic aneurysm (AAA) 3.0 cm to 5.0 cm in diameter in female ([place]) 12/04/2021   Aortic atherosclerosis ([place]) 12/04/2021   Recurrent Clostridioides difficile diarrhea 12/04/2021   Asymptomatic bacteriuria 12/04/2021   CAD (coronary artery disease) 12/04/2021   Tobacco use 12/04/2021   AKI (acute kidney injury) ([place]) 12/02/2021   Cellulitis of left lower extremity 07/20/2021   Mixed hyperlipidemia 07/20/2021   Major depressive disorder 07/20/2021   Acute renal failure superimposed on stage 3a chronic kidney disease ([place]) 07/20/2021   Hypokalemia 07/20/2021   Malnutrition of moderate degree 07/08/2021   SIRS (systemic inflammatory response syndrome) ([place]) 07/07/2021   Osteopenia 11/30/2015   Anemia of chronic disease 08/31/2015   Breast cancer of upper-inner quadrant of left female breast ([place]) 06/29/2015   Status post mastectomy 01/25/2015   Preoperative clearance 12/15/2014   Right bundle branch block 12/15/2014   Essential hypertension 12/15/2014   Mitral valve prolapse 12/15/2014   Breast cancer of upper-outer quadrant of right female breast ([place]) 09/04/2014   Complex sleep apnea syndrome 11/14/2010    Patient's Medications  New Prescriptions   FIDAXOMICIN (DIFICID) 200 MG TABS TABLET    Take 1 tablet (200 mg total) by mouth 2 (two) times daily for 10 days.  Previous Medications   ACETAMINOPHEN (TYLENOL) 500 MG TABLET    Take 1,000 mg by mouth every 6 (six) hours as needed (pain).   ALPRAZOLAM (XANAX) 1 MG TABLET    Take 1 mg by mouth 3 (three) times daily as needed for anxiety or sleep (nerves).   AMLODIPINE (NORVASC) 10 MG TABLET    Take 1 tablet (10 mg total) by mouth daily.   ANASTROZOLE (ARIMIDEX) 1 MG TABLET    TAKE 1 TABLET BY MOUTH EVERY DAY    ASCORBIC ACID (VITAMIN C) 1000 MG TABLET    Take 1,000 mg by mouth daily with lunch.   ASPIRIN EC 81 MG TABLET    Take 81 mg by mouth daily. Swallow whole.   B COMPLEX-C (B-COMPLEX WITH VITAMIN C) TABLET    Take 1 tablet by mouth daily.   BREXPIPRAZOLE (REXULTI) 1 MG TABS TABLET    Take 1 mg by mouth every morning.   CALCIUM CARBONATE (OS-CAL) 600 MG TABS TABLET    Take 600 mg by mouth daily with lunch.   CHOLECALCIFEROL (VITAMIN D) 50 MCG (2000 UT) TABLET    Take 2,000 Units by mouth daily with lunch.   EZETIMIBE (ZETIA) 10 MG TABLET    Take 10 mg by mouth at bedtime.    FERROUS SULFATE 325 (65 FE) MG TABLET    Take 1 tablet (325 mg total) by mouth daily with breakfast.   FLAXSEED, LINSEED, (FLAX SEED OIL) 1000 MG CAPS    Take 1,000 mg by mouth daily with lunch.   GABAPENTIN (NEURONTIN) 600 MG TABLET    Take 1 tablet (600 mg total) by mouth at bedtime for 14 days. Take one tablet (600 mg) by mouth every morning and two tablets (1200 mg) at night   LACTOBACILLUS (PROBIOTIC ACIDOPHILUS PO)    Take 1 tablet by mouth daily with lunch.   LIDOCAINE (LIDODERM) 5 %    Place 1 patch onto the skin at bedtime as needed (pain).   MECLIZINE (ANTIVERT) 25 MG TABLET  Take 25 mg by mouth 4 (four) times daily as needed for dizziness.   NALOXEGOL OXALATE (MOVANTIK) 25 MG TABS TABLET    Take 25 mg by mouth daily as needed (for constipation).   NALOXONE (NARCAN) NASAL SPRAY 4 MG/0.1 ML    Place 1 spray into the nose once as needed (for opioid overdose).   OMEGA-3 FATTY ACIDS (FISH OIL) 1000 MG CAPS    Take 1,000 mg by mouth daily with lunch.   OXYCODONE-ACETAMINOPHEN (PERCOCET) 10-325 MG TABLET    Take 1 tablet by mouth 5 (five) times daily as needed for pain.   POLYETHYLENE GLYCOL (MIRALAX / GLYCOLAX) 17 G PACKET    Take 17 g by mouth daily as needed for mild constipation.   PROAIR HFA 108 (90 BASE) MCG/ACT INHALER    Inhale 2 puffs into the lungs every 4 (four) hours as needed for wheezing or shortness of breath.    ROSUVASTATIN (CRESTOR) 20 MG TABLET    Take 20 mg by mouth at bedtime.   VENLAFAXINE (EFFEXOR-XR) 150 MG 24 HR CAPSULE    Take 150 mg by mouth 2 (two) times daily with breakfast and lunch.  Modified Medications   No medications on file  Discontinued Medications   CEFADROXIL (DURICEF) 500 MG CAPSULE    Take 1 capsule (500 mg total) by mouth 2 (two) times daily.   VANCOMYCIN (VANCOCIN) 125 MG CAPSULE    Take 125 mg by mouth 4 (four) times daily.    Subjective: 71 Y O female with PMH as below including chronic LE edema/PVD, LE DVT, chronic fatigue syndrome, chronic back pain, Fibromyalgia, Anxiety/depression, HTN, HLD, MVP, Reflex sympathetic dystrophy, Sjogren's syndrome, Posterior lumbar fusion./spinal cord stimulator, peripheral neuropathy, HTN, HLD, Breast ca s/p mastectomy who is referred for C diff diarrhea.   Patient is accompanied by her husband. Patient is a poor historian and not consistent with the history. Per notes from PCP, she tested positive for C diff on 09/10/21 when she was treated with a 10 days course of fidaxomicin with improvement in diarrhea. This was in the setting of being treated with antibiotics for cellulitis. However, she had recurrence of diarrhea early sept and was started second round of fidaxomicin by PCP. She was then seen in the ED 12/02/2021 for concerns of recurrent C diff diarrhea, AKI where she refused admission/went home and got admitted 9/6-9/9 for diarrhea/dehydration/AKI and hypokalemia. Fidaxomicin was continued inpatient. GI panel was negative C diff was not sent. She was given a dose of fosfomycin in the ED for UTI. Sh was discharged on 9/9 after completing 10 days course. Followed up with PCP 9/12 and was started on PO vancomycin taper. Vancomycin dosing changed from qid dosing to bid dosing on 9/22 as she had improvement in diarrhea. Then, changed to qid dosing last Tuesday  9/26 as she reported worsening in diarrhea. I do not seen any positive C diff test  after 09/10/21.   She also complains of swelling in her rt leg for a month now with increasing redness and warmth lately. She has a h/o DVT of b/l LE as well as venous disease of LE. She tells me she has not seen vascular recently but seen in the past. She also tells me that her PCP has stopped several of his medications including lasix and asking if that is why her rt leg is swollen.   Denies any fevers, chills, sweats.  Denies any nausea, vomiting and abdominal pain. Diarrhea has been improving for a week  or so and ranges between 1-6 soft mush stool in a day  Denies chest pain, cough and SOB Denies GU symptoms and rashes   01/28/22 Last seen by Dr Candiss Norse , reported to have improvement in diarrhea but not resolved. She is very frustrated today that she continues to still have diarrhea. The consistency of stool is however soft and mushy which has been like that for 2 months. Frequency of loose stools is 4-8 times in a day. She states her head starts hurting with the diarrhea. She already had to change 2 depends this morning. Tells  fidaxomicin has worked better for her diarrhea in the past and would like to try the same again.  Denies fevers, chills, has occasional abdominal cramps. Denies nausea and vomiting. She would also like to see an GI specialist to look for alternative causes of diarrhea.   Review of Systems: all systems reviewed with pertinent positives and negatives as listed above  Past Medical History:  Diagnosis Date   Anxiety    takes Xanax daily as needed   Arthritis    Bilateral lower extremity edema    takes Furosemide daily as needed   Breast cancer [facility]) oncologist-  dr Truitt Merle--  right upper-outer quadrant    dx May 2016--- Stage 0  (Tis,N0,M0)  DCIS  Right breast---  09-13-2014  s/p  radioactive seed/ partial mastectomy / sln bx   Chronic fatigue syndrome    Chronic low back pain    Complication of anesthesia    spinal cord stimulator- to be off for surgery   Depression     takes Effexor daily   Family history of adverse reaction to anesthesia    mom hard to wake up   Fibromyalgia    Headache(784.0)    couple of times a week   Heart murmur    History of bronchitis > 22yr ago   History of colon polyps    benign   History of drug overdose    oxycontin  and oxycodone  Sept 2015--  now has narcan injection prescription   History of DVT of lower extremity    2008--  BILATERAL   History of hiatal hernia    HTN (hypertension)    takes Amlodipine and Ramipril daily   Hyperlipidemia    takes Zetia and Crestor  daily   Joint pain    Mitral valve prolapse    MILD /   PER PT ASYMPTOMATIC   Muscle spasm    takes Zanaflex daily as needed   Osteoporosis    Peripheral neuropathy    takes Neurontin daily   Peripheral vascular disease (HCC)    hx dvt's   RSD (reflex sympathetic dystrophy)    left wrist and forearm from a fx   RSD (reflex sympathetic dystrophy)    S[place]'s disease [place]    Vertigo    takes Meclizine daily    Past Surgical History:  Procedure Laterality Date   BACK SURGERY     BREAST RECONSTRUCTION WITH PLACEMENT OF TISSUE EXPANDER AND FLEX HD (ACELLULAR HYDRATED DERMIS) Right 01/25/2015   Procedure: RIGHT BREAST RECONSTRUCTION WITH PLACEMENT OF IMPLANT AND ACELLULAR DERMAL MATRIX ;  Surgeon: DCrissie Reese MD;  Location[place] [facility]  Service: Plastics;  Laterality: Right;   BREAST RECONSTRUCTION WITH PLACEMENT OF TISSUE EXPANDER AND FLEX HD (ACELLULAR HYDRATED DERMIS) Left 07/05/2015   Procedure: LEFT BREAST RECONSTRUCTION WITH PLACEMENT OF TISSUE EXPANDER AND  ACELLULAR DERMAL MATRIX ;  Surgeon: DCrissie Reese MD;  Location:  [place] [facility];  Service: Plastics;  Laterality: Left;   COLONOSCOPY     ESOPHAGOGASTRODUODENOSCOPY     eye lid surgery Bilateral    FOOT SURGERY Bilateral 1996   MORTON'S NEUROMA   INNER EAR SURGERY Right    LUMBAR [place] SURGERY     MASTECTOMY Right 01/25/2015   nipple sparing    NASAL SINUS SURGERY     NIPPLE SPARING  MASTECTOMY/SENTINAL LYMPH NODE BIOPSY/RECONSTRUCTION/PLACEMENT OF TISSUE EXPANDER Left 07/05/2015   Procedure: LEFT NIPPLE SPARING MASTECTOMY WITH SENTINAL LYMPH NODE BIOPSY ;  Surgeon: [name], MD;  Location: [place];  Service: General;  Laterality: Left;   OPEN REDUCTION INTERNAL FIXATION (ORIF) DISTAL RADIAL FRACTURE Right 08/09/2018   Procedure: OPEN REDUCTION INTERNAL FIXATION (ORIF) DISTAL RADIAL FRACTURE;  Surgeon: Milly Jakob, MD;  Location: [place];  Service: Orthopedics;  Laterality: Right;   ORIF LEFT WRIST FX'S/  LEFT CARPAL TUNNEL RELEASE  1999   POSTERIOR LUMBAR FUSION  01-30-2009   L4--5 Laminectmy w/ decompression/  bilateral L4--5 microdiskectomy and fusion   RADIOACTIVE SEED GUIDED PARTIAL MASTECTOMY WITH AXILLARY SENTINEL LYMPH NODE BIOPSY Right 09/13/2014   Procedure: RADIOACTIVE SEED GUIDED PARTIAL MASTECTOMY WITH AXILLARY SENTINEL LYMPH NODE BIOPSY;  Surgeon: [name], MD;  Location: [place];  Service: General;  Laterality: Right;   RE-EXCISION OF BREAST LUMPECTOMY Right 09/29/2014   Procedure: RE-EXCISION OF BREAST LUMPECTOMY;  Surgeon: [name], MD;  Location: [place];  Service: General;  Laterality: Right;   RIGHT CARPAL TUNNEL RELEASE/  TRIGGER RELEASE RIGHT MIDDLE FINGER  06-18-2006   RIGHT INFERIOR PARATHYROIDECTOMY  04-07-2006   SIMPLE MASTECTOMY WITH AXILLARY SENTINEL NODE BIOPSY Right 01/25/2015   Procedure: RIGHT NIPPLE [place];  Surgeon: [name], MD;  Location: [place];  Service: General;  Laterality: Right;   SPINAL CORD STIMULATOR IMPLANT  2013   TONSILLECTOMY  as child     Social History   Tobacco Use   Smoking status: Every Day    Packs/day: 0.50    Years: 25.00    Total pack years: 12.50    Types: Cigarettes    Last attempt to quit: 09/29/2014    Years since quitting: 7.3   Smokeless tobacco: Never   Tobacco comments:    trying to quit  Substance Use Topics   Alcohol  use: Yes    Alcohol/week: 0.0 standard drinks of alcohol    Comment: occ   Drug use: No    Family History  Problem Relation Age of Onset   Heart murmur Mother    Cancer Mother 11       carcinoid tumor    Hypertension Mother    Heart murmur Sister        x2   Cancer Maternal Aunt        lung cancer   Cancer Paternal Aunt        breast cancer    Cancer Maternal Aunt        gastric cancer    Cancer Cousin        lung cancer   Cancer Cousin        bone cancer    Heart attack Maternal Uncle    Heart attack Paternal Uncle    Hypertension Son    Stroke Neg Hx     Allergies  Allergen Reactions   Carbocaine [Mepivacaine Hcl] Hives and Swelling   Penicillins Nausea And Vomiting, Swelling, Rash and Other (See Comments)    Has patient  had a PCN reaction causing immediate rash, facial/tongue/throat swelling, SOB or lightheadedness with hypotension: Yes Has patient had a PCN reaction causing severe rash involving mucus membranes or skin necrosis: No Has patient had a PCN reaction that required hospitalization No Has patient had a PCN reaction occurring within the last 10 years: Yes If all of the above answers are "NO", then may proceed with Cephalosporin use.   Sulfa Antibiotics Nausea And Vomiting, Swelling, Rash and Other (See Comments)    Headache    Latex Rash and Other (See Comments)    Reaction to gloves and bandaids (paper tape is ok)    Health Maintenance  Topic Date Due   Pneumonia Vaccine 66+ Years old (1 - PCV) 04/07/1956   Hepatitis C Screening  Never done   TETANUS/TDAP  Never done   Zoster Vaccines- Shingrix (1 of 2) Never done   COLONOSCOPY (Pts 45-20yr Insurance coverage will need to be confirmed)  Never done   MAMMOGRAM  Never done   COVID-19 Vaccine (3 - Pfizer risk series) 06/27/2019   INFLUENZA VACCINE  10/29/2021   Medicare Annual Wellness (AWV)  03/12/2022   DEXA SCAN  Completed   HPV VACCINES  Aged Out    Objective: BP (!) 146/81   Pulse 71    Resp 16   Ht '5\' 2"'$  (1.575 m)   Wt 111 lb (50.3 kg)   SpO2 98%   BMI 20.30 kg/m    Physical Exam Constitutional:      Appearance: Normal appearance. Thin appearing and malnourished HENT:     Head: Normocephalic and atraumatic.      Mouth: Mucous membranes are moist.  Eyes:    Conjunctiva/sclera: Conjunctivae normal.     Pupils:   Cardiovascular:     Rate and Rhythm: Normal rate and regular rhythm.     Heart sounds:  Pulmonary:     Effort: Pulmonary effort is normal.     Breath sounds: Normal breath sounds.   Abdominal:     General: Non distended     Palpations: soft. Non tender   Musculoskeletal:        General: Normal range of motion.   Bilateral leg edema, no signs of cellulitis  Bilateral calves soft Bilateral ankle and knee ROM intact  Bilateral DP palpabl  Skin:    General: Skin is warm and dry.     Comments:  Neurological:     General: grossly non focal     Mental Status: awake, alert and oriented to person, place, and time.   Psychiatric:        Mood and Affect: Irritable and frustrated    Lab Results Lab Results  Component Value Date   WBC 6.4 12/31/2021   HGB 9.0 (L) 12/31/2021   HCT 27.5 (L) 12/31/2021   MCV 94.5 12/31/2021   PLT 338 12/31/2021    Lab Results  Component Value Date   CREATININE 1.54 (H) 12/31/2021   BUN 19 12/31/2021   NA 143 12/31/2021   K 3.9 12/31/2021   CL 105 12/31/2021   CO2 32 12/31/2021    Lab Results  Component Value Date   ALT 19 12/05/2021   AST 26 12/05/2021   ALKPHOS 53 12/05/2021   BILITOT 0.4 12/05/2021    No results found for: "CHOL", "HDL", "LDLCALC", "LDLDIRECT", "TRIG", "CHOLHDL" No results found for: "LABRPR", "RPRTITER" No results found for: "HIV1RNAQUANT", "HIV1RNAVL", "CD4TABS"   Outside labs 9/12 CBC unremarkable  9/18 BMP Cr 2.45 , PCP following  Assessment/Plan # Presumed recurrence of C diff diarrhea  09/10/21 C diff toxin gene NAA positive. She has completed 2 courses of Fidaxomicin  since then followed by prolonged PO Vancomycin course She never had C diff test done after her positive test in June and has been treated presumptively as recurrence of C diff diarrhea. GI panel 9/6 negative. Not clear if this is second episode of C diff.  She was started on vancomycin 125 mg po qid by PCP 9/26 which she took her last dose of daily regimen until yesterday. 10/11 C diff ag and Toxin A/B negative  Concerned that her BM are not formed ( soft and mushy for last 2 months). She still has 4-8  episodes in a day. She claims diarrhea improved with fidaxomicin in the past and would like to trial a 10 days course of fidaxomicin instead of finishing off Vancomycin taper ( every 2-3 days for 2-8 weeks ) I do not see benefit of ordering another c diff test today I will also refer her to GI for r/o alternative causes of chronic diarrhea  Fu in a month or with concerns    # RT leg cellulitis  Has resolved   # Chronic venous insufficiency and PAD of lower extremities Followed by Vascular  I have personally spent 43 minutes involved in face-to-face and non-face-to-face activities for this patient on the day of the visit. Professional time spent includes the following activities: Preparing to see the patient (review of tests), Obtaining and/or reviewing separately obtained history (admission/discharge record), Performing a medically appropriate examination and/or evaluation , Ordering medications/tests/procedures, referring and communicating with other health care professionals, Documenting clinical information in the EMR, Independently interpreting results (not separately reported), Communicating results to the patient/family/caregiver, Counseling and educating the patient/family/caregiver and Care coordination (not separately reported).   Wilber Oliphant, [place] [facility] [employer] [facility] 01/28/2022, 12:11 PM

## 2022-01-30 ENCOUNTER — Other Ambulatory Visit: Payer: Self-pay

## 2022-01-30 DIAGNOSIS — I872 Venous insufficiency (chronic) (peripheral): Secondary | ICD-10-CM

## 2022-02-17 DIAGNOSIS — G4700 Insomnia, unspecified: Secondary | ICD-10-CM | POA: Diagnosis not present

## 2022-02-17 DIAGNOSIS — M810 Age-related osteoporosis without current pathological fracture: Secondary | ICD-10-CM | POA: Diagnosis not present

## 2022-02-17 DIAGNOSIS — I129 Hypertensive chronic kidney disease with stage 1 through stage 4 chronic kidney disease, or unspecified chronic kidney disease: Secondary | ICD-10-CM | POA: Diagnosis not present

## 2022-02-17 DIAGNOSIS — E782 Mixed hyperlipidemia: Secondary | ICD-10-CM | POA: Diagnosis not present

## 2022-02-17 DIAGNOSIS — K219 Gastro-esophageal reflux disease without esophagitis: Secondary | ICD-10-CM | POA: Diagnosis not present

## 2022-02-17 DIAGNOSIS — F322 Major depressive disorder, single episode, severe without psychotic features: Secondary | ICD-10-CM | POA: Diagnosis not present

## 2022-02-25 ENCOUNTER — Telehealth: Payer: Self-pay | Admitting: Gastroenterology

## 2022-02-25 NOTE — Telephone Encounter (Signed)
Good Evening Dr Lyndel Safe,  Supervising Provider  I have received records for this patient from [facility] GI, Dr. Kathline Magic office. She is being referred to [place] by Dr. West Bali over at the [facility] for Infectious Disease for difficile diarrhea. Patient states she is transferring due to the fact that since her doctor referred here she thought that would be the best thing to do. Please advise on scheduling.  Thank you

## 2022-03-04 ENCOUNTER — Ambulatory Visit: Payer: PPO | Admitting: Infectious Diseases

## 2022-03-07 NOTE — Telephone Encounter (Signed)
LVM for patient to call and schedule an appointment with Dr Lyndel Safe or with an APP

## 2022-03-10 NOTE — Telephone Encounter (Signed)
Call home and asked me to enter a remote acces code. Called cell and left voicemail. Letter sent and mychart message. Notes placed in drawer for keeping

## 2022-03-17 NOTE — Telephone Encounter (Signed)
Patient no longer wanting to transfer.

## 2022-03-18 DIAGNOSIS — I129 Hypertensive chronic kidney disease with stage 1 through stage 4 chronic kidney disease, or unspecified chronic kidney disease: Secondary | ICD-10-CM | POA: Diagnosis not present

## 2022-03-18 DIAGNOSIS — I341 Nonrheumatic mitral (valve) prolapse: Secondary | ICD-10-CM | POA: Diagnosis not present

## 2022-03-18 DIAGNOSIS — G9050 Complex regional pain syndrome I, unspecified: Secondary | ICD-10-CM | POA: Diagnosis not present

## 2022-03-18 DIAGNOSIS — Z0000 Encounter for general adult medical examination without abnormal findings: Secondary | ICD-10-CM | POA: Diagnosis not present

## 2022-03-18 DIAGNOSIS — F3341 Major depressive disorder, recurrent, in partial remission: Secondary | ICD-10-CM | POA: Diagnosis not present

## 2022-03-18 DIAGNOSIS — Z23 Encounter for immunization: Secondary | ICD-10-CM | POA: Diagnosis not present

## 2022-03-18 DIAGNOSIS — K219 Gastro-esophageal reflux disease without esophagitis: Secondary | ICD-10-CM | POA: Diagnosis not present

## 2022-03-18 DIAGNOSIS — D649 Anemia, unspecified: Secondary | ICD-10-CM | POA: Diagnosis not present

## 2022-03-18 DIAGNOSIS — F17200 Nicotine dependence, unspecified, uncomplicated: Secondary | ICD-10-CM | POA: Diagnosis not present

## 2022-03-18 DIAGNOSIS — N952 Postmenopausal atrophic vaginitis: Secondary | ICD-10-CM | POA: Diagnosis not present

## 2022-03-18 DIAGNOSIS — C50919 Malignant neoplasm of unspecified site of unspecified female breast: Secondary | ICD-10-CM | POA: Diagnosis not present

## 2022-03-18 DIAGNOSIS — E782 Mixed hyperlipidemia: Secondary | ICD-10-CM | POA: Diagnosis not present

## 2022-03-18 DIAGNOSIS — I739 Peripheral vascular disease, unspecified: Secondary | ICD-10-CM | POA: Diagnosis not present

## 2022-03-18 DIAGNOSIS — N183 Chronic kidney disease, stage 3 unspecified: Secondary | ICD-10-CM | POA: Diagnosis not present

## 2022-03-18 NOTE — Telephone Encounter (Signed)
Thank you :)

## 2022-03-20 DIAGNOSIS — R809 Proteinuria, unspecified: Secondary | ICD-10-CM | POA: Diagnosis not present

## 2022-04-09 ENCOUNTER — Ambulatory Visit (HOSPITAL_COMMUNITY)
Admission: RE | Admit: 2022-04-09 | Discharge: 2022-04-09 | Disposition: A | Discharge status: Home / Self Care | Payer: PPO | Source: Ambulatory Visit | Attending: Hematology | Admitting: Hematology

## 2022-04-09 DIAGNOSIS — M533 Sacrococcygeal disorders, not elsewhere classified: Secondary | ICD-10-CM | POA: Diagnosis not present

## 2022-04-09 DIAGNOSIS — G8929 Other chronic pain: Secondary | ICD-10-CM | POA: Diagnosis not present

## 2022-04-09 DIAGNOSIS — R221 Localized swelling, mass and lump, neck: Secondary | ICD-10-CM | POA: Insufficient documentation

## 2022-04-09 DIAGNOSIS — Z5181 Encounter for therapeutic drug level monitoring: Secondary | ICD-10-CM | POA: Diagnosis not present

## 2022-04-09 DIAGNOSIS — M545 Low back pain, unspecified: Secondary | ICD-10-CM | POA: Diagnosis not present

## 2022-04-09 DIAGNOSIS — Z79899 Other long term (current) drug therapy: Secondary | ICD-10-CM | POA: Diagnosis not present

## 2022-04-09 DIAGNOSIS — R590 Localized enlarged lymph nodes: Secondary | ICD-10-CM | POA: Diagnosis not present

## 2022-04-14 ENCOUNTER — Other Ambulatory Visit: Payer: Self-pay | Admitting: Hematology

## 2022-04-14 ENCOUNTER — Telehealth: Payer: Self-pay | Admitting: *Deleted

## 2022-04-14 DIAGNOSIS — R221 Localized swelling, mass and lump, neck: Secondary | ICD-10-CM

## 2022-04-14 NOTE — Telephone Encounter (Signed)
Connie Bennett is asking for the results of her Neck US done last week.

## 2022-04-16 DIAGNOSIS — F3181 Bipolar II disorder: Secondary | ICD-10-CM | POA: Diagnosis not present

## 2022-04-22 DIAGNOSIS — H3554 Dystrophies primarily involving the retinal pigment epithelium: Secondary | ICD-10-CM | POA: Diagnosis not present

## 2022-04-22 DIAGNOSIS — H2513 Age-related nuclear cataract, bilateral: Secondary | ICD-10-CM | POA: Diagnosis not present

## 2022-04-23 ENCOUNTER — Ambulatory Visit (HOSPITAL_COMMUNITY)
Admission: RE | Admit: 2022-04-23 | Discharge: 2022-04-23 | Disposition: A | Discharge status: Home / Self Care | Payer: PPO | Source: Ambulatory Visit | Attending: Hematology | Admitting: Hematology

## 2022-04-23 ENCOUNTER — Encounter (HOSPITAL_COMMUNITY): Payer: Self-pay

## 2022-04-23 DIAGNOSIS — R221 Localized swelling, mass and lump, neck: Secondary | ICD-10-CM | POA: Diagnosis not present

## 2022-04-23 DIAGNOSIS — N1831 Chronic kidney disease, stage 3a: Secondary | ICD-10-CM

## 2022-04-23 DIAGNOSIS — N179 Acute kidney failure, unspecified: Secondary | ICD-10-CM | POA: Diagnosis not present

## 2022-04-23 LAB — POCT I-STAT CREATININE: Creatinine, Ser: 1.5 mg/dL — ABNORMAL HIGH (ref 0.44–1.00)

## 2022-04-23 MED ORDER — IOHEXOL 300 MG/ML  SOLN
75.0000 mL | Freq: Once | INTRAMUSCULAR | Status: AC | PRN
  Administered 2022-04-23: 60 mL via INTRAVENOUS

## 2022-04-23 MED ORDER — SODIUM CHLORIDE (PF) 0.9 % IJ SOLN
INTRAMUSCULAR | Status: AC
  Filled 2022-04-23: qty 50

## 2022-04-23 NOTE — Progress Notes (Addendum)
[place]   [phone] [phone]   Clinic Follow up Note   Patient Care Team: Mayra Neer, MD as PCP - General (Family Medicine) Holley Bouche, NP (Inactive) as Nurse Practitioner (Nurse Practitioner) [name], MD as Consulting Physician (General Surgery) Truitt Merle, MD as Consulting Physician (Hematology) Sylvan Cheese, NP as Nurse Practitioner (Hematology and Oncology) Crissie Reese, MD as Consulting Physician (Plastic Surgery) Izora Gala, MD as Consulting Physician (Otolaryngology)  Date of Service:  04/24/2022  I connected with Connie Bennett on 04/24/2022 at 10:20 AM EST by telephone visit and verified that I am speaking with the correct person using two identifiers.  I discussed the limitations, risks, security and privacy concerns of performing an evaluation and management service by telephone and the availability of in person appointments. I also discussed with the patient that there may be a patient responsible charge related to this service. The patient expressed understanding and agreed to proceed.   Other persons participating in the visit and their role in the encounter:   Husband  Patient's location:  home Provider's location:  Office  CHIEF COMPLAINT: f/u of bilateral breast cancer   CURRENT THERAPY:  Anastrozole 1 mg daily, started on 09/28/15 -Zometa, q20month, starting 10/2020     ASSESSMENT & PLAN:  Connie BOGUSLAWSKIis a 72y.o. female with    Breast cancer of upper-outer quadrant of left female breast ([facility] Invasive ductal carcinoma, pT2N0M0, stage IIA, G2, ER+/PR-/HER2-, ONCOTYPE RS 28  -diagnosed in 05/2015. S/p left breast mastectomy.  -Oncotype RS showed moderate risk, chemo ultimately not pursued. -She started anti-estrogen therapy with exemestane in 07/2015, but stopped after 1 month use due to skin rash. She started anastrozole in 08/2015, plan for 7 years. She is tolerating well  overall. -she is clinically doing very well, from a breast cancer standpoint. Labs reviewed, overall stable. Breast exam was unremarkable. There is no clinical concern for recurrence.  Neck mass -present since at least 05/2021 -she feels it has gotten larger and notes it is tender. She denies dental concerns. She notes she continues smoking but is down to 3 cigarettes a day. -I discussed her neck ultrasound from yesterday, which showed a 1.3 cm mass in the left parotid gland, slightly enlarged since 2022. -I discussed her recent CT neck scan findings, which showed a 1.3 cm mass in left parotid gland.  I recommend ENT referral, she agrees   PLAN: -Discuss Ultrasound and CT of the Neck findings  - referral to ENT -DEXA due 05/2022    SUMMARY OF ONCOLOGIC HISTORY: Oncology History Overview Note  Breast cancer of upper-outer quadrant of RIGHT female breast ([facility]   Staging form: Breast, AJCC 7th Edition Clinical stage from 09/06/2014: Stage 0 (Tis (DCIS), N0, M0)  Pathologic stage from 01/25/2015: Stage 0 (Tis (DCIS), N0, cM0)  ------  Breast cancer of upper-inner quadrant of LEFT female breast ([facility]   Staging form: Breast, AJCC 7th Edition Clinical stage from 06/20/2015: Stage IA (T1c, N0, M0)      Pathologic stage from 07/05/2015: Stage IIA (T2, N0, cM0)    Breast cancer of upper-outer quadrant of right female breast ([place]  08/23/2014 Mammogram   Mammogram and ultrasound showed a 3cm cystic lesion at 8:30 of right breast, and a 7 mm and a 5 mm increased density lesion at 10:00 of the right breast.   08/29/2014 Initial Biopsy   Right breast core needle bx x 2: high-grade DCIS, with focus of mildly  suspicious for early stromal invasion. ER- (0%), PR- (0%).    08/29/2014 Clinical Stage   Stage 0: Tis N0   09/13/2014 Surgery   Right lumpectomy/SLNB ([place]): High grade DCIS with necrosis and calcfications, positive margins. 5 LN removed and negative for malignancy (0/5). Grade 3.   09/13/2014  Pathologic Stage   Stage 0: Tis N0   09/29/2014 Surgery   Re-excision for positive margins; multiple breast margins re-excised, high-grade DCIS, measuring 1.0 cm, 3.0 cm, 2.0 cm, some margins are still positive or very close.    01/25/2015 Definitive Surgery   Right nipple-sparing mastectomy Barry Dienes): DCIS with calcifications, high grade, spans 9 cm, LCIS, fibrocystic changes with adenosis and calcifications, negative margins. 1 LN removed and negative. Imeediate reconstruction   03/23/2015 Survivorship   Survivorship care plan completed and mailed to patient in lieu of in person visit at request of patient.   04/23/2021 Imaging    IMPRESSION: 1. Palpable complaint is a 13 mm mass/neoplasm in the left parotid tail. 2. No adenopathy in the neck or upper chest to correlate with breast cancer history.   Breast cancer of upper-inner quadrant of left female breast ([place])  06/28/2013 Imaging   DEXA scan: Osteopenia [name])   06/19/2015 Mammogram   Diagnostic mammogram and ultrasound showed a 1.3 cm in the upper inner quadrant of left breast, and a 0.9 mm simple cyst. Axilla was negative.   06/20/2015 Receptors her2   ER 100% positive, PR negative, Ki-67 30%, HER-2 negative   06/20/2015 Initial Biopsy   The left breast upper inner quadrant mass biopsy showed invasive ductal carcinoma, grade 2   06/20/2015 Initial Diagnosis   Breast cancer of upper-inner quadrant of left female breast ([place])   07/05/2015 Surgery   Left breast nipple-sparing mastectomy and sentinel lymph node biopsy with immediate reconstruction Barry Dienes); reconstruction with Dr. Harlow Mares at [facility]   07/05/2015 Pathology Results   Left mastectomy: Invasive ductal carcinoma with extracellular mucin, grade 2, spanning 2.0 cm, margins were negative, lobular carcinoma in situ, 3 sentinel lymph nodes negative. PR repeated and remains negative. HER2 remains neg (ratio 1.21)   07/05/2015 Pathologic Stage   pT2, pN0: Stage IIA     07/05/2015 Oncotype testing   RS 28 (intermediate risk), which predicts 10 year risk of distant recurrence 18% with tamoxifen alone   07/30/2015 - 08/30/2015 Anti-estrogen oral therapy   Adjuvant exemestane 25 mg once daily, stopped after one month due to skin rash.    09/01/2015 -  Anti-estrogen oral therapy   Switched to anastrozole '1mg'$  daily.  Planned duration of treatment : 5-10 years Burr Medico)      INTERVAL HISTORY:  Connie Bennett was contacted for a follow up of bilateral breast cancer . She was last seen by me on 11/25/2021.  Pt states it is tender to the touch on the left side of the neck.   All other systems were reviewed with the patient and are negative.  MEDICAL HISTORY:  Past Medical History:  Diagnosis Date   Anxiety    takes Xanax daily as needed   Arthritis    Bilateral lower extremity edema    takes Furosemide daily as needed   Breast cancer [facility]) oncologist-  dr Truitt Merle--  right upper-outer quadrant    dx May 2016--- Stage 0  (Tis,N0,M0)  DCIS  Right breast---  09-13-2014  s/p  radioactive seed/ partial mastectomy / sln bx   Chronic fatigue syndrome    Chronic low back pain  Complication of anesthesia    spinal cord stimulator- to be off for surgery   Depression    takes Effexor daily   Family history of adverse reaction to anesthesia    mom hard to wake up   Fibromyalgia    Headache(784.0)    couple of times a week   Heart murmur    History of bronchitis > 21yr ago   History of colon polyps    benign   History of drug overdose    oxycontin  and oxycodone  Sept 2015--  now has narcan injection prescription   History of DVT of lower extremity    2008--  BILATERAL   History of hiatal hernia    HTN (hypertension)    takes Amlodipine and Ramipril daily   Hyperlipidemia    takes Zetia and Crestor  daily   Joint pain    Mitral valve prolapse    MILD /   PER PT ASYMPTOMATIC   Muscle spasm    takes Zanaflex daily as needed   Osteoporosis    Peripheral  neuropathy    takes Neurontin daily   Peripheral vascular disease (HCC)    hx dvt's   RSD (reflex sympathetic dystrophy)    left wrist and forearm from a fx   RSD (reflex sympathetic dystrophy)    S[place]ren's disease ([place]    Vertigo    takes Meclizine daily     SURGICAL HISTORY: Past Surgical History:  Procedure Laterality Date   BACK SURGERY     BREAST RECONSTRUCTION WITH PLACEMENT OF TISSUE EXPANDER AND FLEX HD (ACELLULAR HYDRATED DERMIS) Right 01/25/2015   Procedure: RIGHT BREAST RECONSTRUCTION WITH PLACEMENT OF IMPLANT AND ACELLULAR DERMAL MATRIX ;  Surgeon: DCrissie Reese MD;  [place]  Service: Plastics;  Laterality: Right;   BREAST RECONSTRUCTION WITH PLACEMENT OF TISSUE EXPANDER AND FLEX HD (ACELLULAR HYDRATED DERMIS) Left 07/05/2015   Procedure: LEFT BREAST RECONSTRUCTION WITH PLACEMENT OF TISSUE EXPANDER AND  ACELLULAR DERMAL MATRIX ;  Surgeon: DCrissie Reese MD;  [place]  Service: Plastics;  Laterality: Left;   COLONOSCOPY     ESOPHAGOGASTRODUODENOSCOPY     eye lid surgery Bilateral    FOOT SURGERY Bilateral 1996   [facility]ORTON'S NEUROMA   INNER EAR SURGERY Right    [place]     MASTECTOMY Right 01/25/2015   nipple sparing    NASAL SINUS SURGERY     NIPPLE SPARING MASTECTOMY/SENTINAL LYMPH NODE BIOPSY/RECONSTRUCTION/PLACEMENT OF TISSUE EXPANDER Left 07/05/2015   Procedure: LEFT NIPPLE SPARING MASTECTOMY WITH SENTINAL LYMPH NODE BIOPSY ;  Surgeon: FStark Klein MD;  [place]  Service: General;  Laterality: Left;   OPEN REDUCTION INTERNAL FIXATION (ORIF) DISTAL RADIAL FRACTURE Right 08/09/2018   Procedure: OPEN REDUCTION INTERNAL FIXATION (ORIF) DISTAL RADIAL FRACTURE;  Surgeon: TMilly Jakob MD;  [place]  Service: Orthopedics;  Laterality: Right;   ORIF LEFT WRIST FX'S/  LEFT CARPAL TUNNEL RELEASE  1999   POSTERIOR LUMBAR FUSION  01-30-2009   L4--5 Laminectmy w/ decompression/  bilateral L4--5 microdiskectomy and  fusion   RADIOACTIVE SEED GUIDED PARTIAL MASTECTOMY WITH AXILLARY SENTINEL LYMPH NODE BIOPSY Right 09/13/2014   Procedure: RADIOACTIVE SEED GUIDED PARTIAL MASTECTOMY WITH AXILLARY SENTINEL LYMPH NODE BIOPSY;  Surgeon: FStark Klein MD;  [place]  Service: General;  Laterality: Right;   RE-EXCISION OF BREAST LUMPECTOMY Right 09/29/2014   Procedure: RE-EXCISION OF BREAST LUMPECTOMY;  Surgeon: FStark Klein MD;  [place]  Service: General;  Laterality: Right;   RIGHT CARPAL TUNNEL RELEASE/  TRIGGER RELEASE RIGHT MIDDLE FINGER  06-18-2006   RIGHT INFERIOR PARATHYROIDECTOMY  04-07-2006   SIMPLE MASTECTOMY WITH AXILLARY SENTINEL NODE BIOPSY Right 01/25/2015   Procedure: RIGHT NIPPLE SPARING MASTECTOMY;  Surgeon: [name], MD;  Location: [place];  Service: General;  Laterality: Right;   SPINAL CORD STIMULATOR IMPLANT  2013   TONSILLECTOMY  as child    I have reviewed the social history and family history with the patient and they are unchanged from previous note.  ALLERGIES:  is allergic to carbocaine [mepivacaine hcl], penicillins, sulfa antibiotics, and latex.  MEDICATIONS:  Current Outpatient Medications  Medication Sig Dispense Refill   acetaminophen (TYLENOL) 500 MG tablet Take 1,000 mg by mouth every 6 (six) hours as needed (pain).     ALPRAZolam (XANAX) 1 MG tablet Take 1 mg by mouth 3 (three) times daily as needed for anxiety or sleep (nerves).     amLODipine (NORVASC) 10 MG tablet Take 1 tablet (10 mg total) by mouth daily. 30 tablet 1   anastrozole (ARIMIDEX) 1 MG tablet TAKE 1 TABLET BY MOUTH EVERY DAY (Patient taking differently: Take 1 mg by mouth at bedtime.) 90 tablet 3   Ascorbic Acid (VITAMIN C) 1000 MG tablet Take 1,000 mg by mouth daily with lunch.     aspirin EC 81 MG tablet Take 81 mg by mouth daily. Swallow whole.     B Complex-C (B-COMPLEX WITH VITAMIN C) tablet Take 1 tablet by mouth daily. 120 tablet 0    brexpiprazole (REXULTI) 1 MG TABS tablet Take 1 mg by mouth every morning.     calcium carbonate (OS-CAL) 600 MG TABS tablet Take 600 mg by mouth daily with lunch.     Cholecalciferol (VITAMIN D) 50 MCG (2000 UT) tablet Take 2,000 Units by mouth daily with lunch.     ezetimibe (ZETIA) 10 MG tablet Take 10 mg by mouth at bedtime.      ferrous sulfate 325 (65 FE) MG tablet Take 1 tablet (325 mg total) by mouth daily with breakfast. 60 tablet 0   Flaxseed, Linseed, (FLAX SEED OIL) 1000 MG CAPS Take 1,000 mg by mouth daily with lunch.     gabapentin (NEURONTIN) 600 MG tablet Take 1 tablet (600 mg total) by mouth at bedtime for 14 days. Take one tablet (600 mg) by mouth every morning and two tablets (1200 mg) at night (Patient taking differently: Take 600-1,200 mg by mouth See admin instructions. Take one tablet (600 mg) by mouth every morning, one tablet (600 mg) and two tablets (1200 mg) at night) 14 tablet 0   Lactobacillus (PROBIOTIC ACIDOPHILUS PO) Take 1 tablet by mouth daily with lunch.     lidocaine (LIDODERM) 5 % Place 1 patch onto the skin at bedtime as needed (pain).     meclizine (ANTIVERT) 25 MG tablet Take 25 mg by mouth 4 (four) times daily as needed for dizziness.     naloxegol oxalate (MOVANTIK) 25 MG TABS tablet Take 25 mg by mouth daily as needed (for constipation).     naloxone (NARCAN) nasal spray 4 mg/0.1 mL Place 1 spray into the nose once as needed (for opioid overdose).     Omega-3 Fatty Acids (FISH OIL) 1000 MG CAPS Take 1,000 mg by mouth daily with lunch.     oxyCODONE-acetaminophen (PERCOCET) 10-325 MG tablet Take 1 tablet by mouth 5 (five) times daily as needed for pain.     polyethylene glycol (  MIRALAX / GLYCOLAX) 17 g packet Take 17 g by mouth daily as needed for mild constipation. 14 each 0   PROAIR HFA 108 (90 Base) MCG/ACT inhaler Inhale 2 puffs into the lungs every 4 (four) hours as needed for wheezing or shortness of breath.  2   rosuvastatin (CRESTOR) 20 MG tablet Take  20 mg by mouth at bedtime.     venlafaxine (EFFEXOR-XR) 150 MG 24 hr capsule Take 150 mg by mouth 2 (two) times daily with breakfast and lunch.     No current facility-administered medications for this visit.    PHYSICAL EXAMINATION: ECOG PERFORMANCE STATUS: 0 - Asymptomatic  There were no vitals filed for this visit. Wt Readings from Last 3 Encounters:  01/28/22 111 lb (50.3 kg)  01/23/22 108 lb (49 kg)  01/10/22 111 lb (50.3 kg)     No vitals taken today, Exam not performed today  LABORATORY DATA:  I have reviewed the data as listed    Latest Ref Rng & Units 12/31/2021   10:58 AM 12/05/2021    4:16 AM 12/04/2021    8:39 AM  CBC  WBC 3.8 - 10.8 Thousand/uL 6.4  6.7  8.2   Hemoglobin 11.7 - 15.5 g/dL 9.0  10.7  11.1   Hematocrit 35.0 - 45.0 % 27.5  32.7  34.3   Platelets 140 - 400 Thousand/uL 338  215  246         Latest Ref Rng & Units 04/23/2022    8:40 AM 12/31/2021   10:58 AM 12/07/2021    3:46 AM  CMP  Glucose 65 - 99 mg/dL  82  83   BUN 7 - 25 mg/dL  19  14   Creatinine 0.44 - 1.00 mg/dL 1.50  1.54  1.74   Sodium 135 - 146 mmol/L  143  145   Potassium 3.5 - 5.3 mmol/L  3.9  3.5   Chloride 98 - 110 mmol/L  105  116   CO2 20 - 32 mmol/L  32  23   Calcium 8.6 - 10.4 mg/dL  9.2  8.1       RADIOGRAPHIC STUDIES: I have personally reviewed the radiological images as listed and agreed with the findings in the report. CT Soft Tissue Neck W Contrast  Result Date: 04/23/2022 CLINICAL DATA:  History of bilateral breast cancer. Bump on left neck for 1.5 years which is mildly larger. EXAM: CT NECK WITH CONTRAST TECHNIQUE: Multidetector CT imaging of the neck was performed using the standard protocol following the bolus administration of intravenous contrast. RADIATION DOSE REDUCTION: This exam was performed according to the departmental dose-optimization program which includes automated exposure control, adjustment of the mA and/or kV according to patient size and/or use of  iterative reconstruction technique. CONTRAST:  94m OMNIPAQUE IOHEXOL 300 MG/ML  SOLN COMPARISON:  None Available. FINDINGS: Pharynx and larynx: No evidence of mass or inflammation. Salivary glands: Solid, mildly heterogeneous enhancing nodule at the left parotid tail measuring 13 mm craniocaudal, 11 mm in May 2022 in incidentally covered. Thyroid: Normal Lymph nodes: History of bilateral breast cancer. No adenopathy seen in the upper chest or bilateral neck. Vascular: Generalized atheromatous calcification. Limited intracranial: Negative Visualized orbits: Negative Mastoids and visualized paranasal sinuses: Clear Skeleton: Ordinary and generalized cervical spine degeneration with diffuse endplate and facet spurring. Partially covered thoracic spinal stimulator. Upper chest: No acute finding. Postoperative breasts that are partially covered. IMPRESSION: 1. Palpable complaint is a 13 mm mass/neoplasm in the left parotid tail. 2.  No adenopathy in the neck or upper chest to correlate with breast cancer history. Electronically Signed   By: Jorje Guild M.D.   On: 04/23/2022 12:38      Orders Placed This Encounter  Procedures   Ambulatory referral to ENT    Referral Priority:   Routine    Referral Type:   Consultation    Referral Reason:   Specialty Services Required    Requested Specialty:   Otolaryngology    Number of Visits Requested:   1   All questions were answered. The patient knows to call the clinic with any problems, questions or concerns. No barriers to learning was detected. The total time spent in the appointment was 21 minutes.     Truitt Merle, MD 04/24/2022   Felicity Coyer am acting as scribe for Truitt Merle, MD.   I have reviewed the above documentation for accuracy and completeness, and I agree with the above.

## 2022-04-23 NOTE — Assessment & Plan Note (Signed)
-  present since at least 05/2021 -she feels it has gotten larger and notes it is tender. She denies dental concerns. She notes she continues smoking but is down to 3 cigarettes a day. -I discussed her neck ultrasound from yesterday, which showed a 1.3 cm mass in the left parotid gland, slightly enlarged since 2022. -I recommend a CT neck with contrast for further evaluation and ENT referral

## 2022-04-23 NOTE — Assessment & Plan Note (Signed)
Invasive ductal carcinoma, pT2N0M0, stage IIA, G2, ER+/PR-/HER2-, ONCOTYPE RS 28  -diagnosed in 05/2015. S/p left breast mastectomy.  -Oncotype RS showed moderate risk, chemo ultimately not pursued. -She started anti-estrogen therapy with exemestane in 07/2015, but stopped after 1 month use due to skin rash. She started anastrozole in 08/2015, plan for 7 years. She is tolerating well overall. -she is clinically doing very well, from a breast cancer standpoint. Labs reviewed, overall stable. Breast exam was unremarkable. There is no clinical concern for recurrence.

## 2022-04-24 ENCOUNTER — Other Ambulatory Visit: Payer: Self-pay

## 2022-04-24 ENCOUNTER — Inpatient Hospital Stay: Payer: PPO | Attending: Hematology | Admitting: Hematology

## 2022-04-24 DIAGNOSIS — C50411 Malignant neoplasm of upper-outer quadrant of right female breast: Secondary | ICD-10-CM

## 2022-04-24 DIAGNOSIS — Z170 Estrogen receptor positive status [ER+]: Secondary | ICD-10-CM

## 2022-04-24 DIAGNOSIS — R221 Localized swelling, mass and lump, neck: Secondary | ICD-10-CM

## 2022-04-24 NOTE — Progress Notes (Signed)
Per Secure Chat message sent to this RN from Dr. Burr Medico.  Send referral to Dr. Constance Holster with [facility] [place] with [facility] [phone]  [phone]).  Referral order, pt demographics, Dr. Ernestina Penna office note, CT Scan results, and images from CT Scans (sent through [facility]) were sent to Dr. Constance Holster.  Fax confirmation received.  Images from diagnostic test sent by Maryclare Labrador in Radiology through [facility] to [facility].

## 2022-04-30 ENCOUNTER — Ambulatory Visit: Payer: PPO | Admitting: Vascular Surgery

## 2022-04-30 ENCOUNTER — Ambulatory Visit (HOSPITAL_COMMUNITY): Payer: PPO

## 2022-04-30 DIAGNOSIS — Z86010 Personal history of colonic polyps: Secondary | ICD-10-CM | POA: Diagnosis not present

## 2022-04-30 DIAGNOSIS — Z8719 Personal history of other diseases of the digestive system: Secondary | ICD-10-CM | POA: Diagnosis not present

## 2022-04-30 DIAGNOSIS — K589 Irritable bowel syndrome without diarrhea: Secondary | ICD-10-CM | POA: Diagnosis not present

## 2022-05-05 DIAGNOSIS — I129 Hypertensive chronic kidney disease with stage 1 through stage 4 chronic kidney disease, or unspecified chronic kidney disease: Secondary | ICD-10-CM | POA: Diagnosis not present

## 2022-05-05 DIAGNOSIS — G4700 Insomnia, unspecified: Secondary | ICD-10-CM | POA: Diagnosis not present

## 2022-05-05 DIAGNOSIS — E782 Mixed hyperlipidemia: Secondary | ICD-10-CM | POA: Diagnosis not present

## 2022-05-05 DIAGNOSIS — F3341 Major depressive disorder, recurrent, in partial remission: Secondary | ICD-10-CM | POA: Diagnosis not present

## 2022-05-05 DIAGNOSIS — M810 Age-related osteoporosis without current pathological fracture: Secondary | ICD-10-CM | POA: Diagnosis not present

## 2022-05-05 DIAGNOSIS — N183 Chronic kidney disease, stage 3 unspecified: Secondary | ICD-10-CM | POA: Diagnosis not present

## 2022-05-05 DIAGNOSIS — K219 Gastro-esophageal reflux disease without esophagitis: Secondary | ICD-10-CM | POA: Diagnosis not present

## 2022-05-09 DIAGNOSIS — M542 Cervicalgia: Secondary | ICD-10-CM | POA: Diagnosis not present

## 2022-05-09 DIAGNOSIS — M25511 Pain in right shoulder: Secondary | ICD-10-CM | POA: Diagnosis not present

## 2022-05-22 DIAGNOSIS — K118 Other diseases of salivary glands: Secondary | ICD-10-CM | POA: Diagnosis not present

## 2022-05-27 DIAGNOSIS — M810 Age-related osteoporosis without current pathological fracture: Secondary | ICD-10-CM | POA: Insufficient documentation

## 2022-05-27 NOTE — Assessment & Plan Note (Deleted)
-  She was diagnosed in 07/2014. She is s/p right lumpectomy and multiple re-excision which resulted in her ultimately having a right breast mastectomy.

## 2022-05-27 NOTE — Assessment & Plan Note (Deleted)
-  Her 11/2017 DEXA shows Osteopenia with lowest T-score -2.2 at left femur neck. Repeat in February 2022 showed worsening with T score -2.9 at the left femur neck, she has osteoporosis now -she began Zometa q32month on 11/07/20. -plan for repeat DEXA soon, I encourage her to call and schedule

## 2022-05-27 NOTE — Assessment & Plan Note (Deleted)
Invasive ductal carcinoma, pT2N0M0, stage IIA, G2, ER+/PR-/HER2-, ONCOTYPE RS 28  -diagnosed in 05/2015. S/p left breast mastectomy.  -Oncotype RS showed moderate risk, chemo ultimately not pursued. -She started anti-estrogen therapy with exemestane in 07/2015, but stopped after 1 month use due to skin rash. She started anastrozole in 08/2015, plan for 7 years. She is tolerating well overall.

## 2022-05-28 ENCOUNTER — Inpatient Hospital Stay: Payer: PPO

## 2022-05-28 ENCOUNTER — Inpatient Hospital Stay: Payer: PPO | Admitting: Hematology

## 2022-05-28 DIAGNOSIS — Z170 Estrogen receptor positive status [ER+]: Secondary | ICD-10-CM

## 2022-05-28 DIAGNOSIS — D0511 Intraductal carcinoma in situ of right breast: Secondary | ICD-10-CM

## 2022-05-28 DIAGNOSIS — M810 Age-related osteoporosis without current pathological fracture: Secondary | ICD-10-CM

## 2022-05-31 NOTE — H&P (Signed)
HPI:   Connie Bennett is a 72 y.o. female who presents as a consult Patient.   Referring Provider: Johney Maine, *  Chief complaint: Parotid mass.  HPI: She noticed a lump in the left upper neck area about a year and a half ago. She thinks it may have gotten a little bit larger since then. She had an ultrasound and CT scan. No history of skin cancer. She has had breast cancer. The lump is a little tender sometimes with palpation.  PMH/Meds/All/SocHx/FamHx/ROS:   Past Medical History:  Diagnosis Date   Complex regional pain syndrome of upper extremity   Depression   Fatigue  chronic fatigue syndrome   Fibromyalgia   Fibromyalgia   Hyperlipidemia   Hypertension   Hypertension   Normal pregnancy   RSD (reflex sympathetic dystrophy)   Past Surgical History:  Procedure Laterality Date   BACK SURGERY   BREAST BIOPSY  lumpectomy   carpel tunnel syndrome Bilateral   EXTERNAL EAR SURGERY   FOOT SURGERY  bilateral   FOOT SURGERY   NEUROPLASTY / TRANSPOSITION MEDIAN NERVE AT CARPAL TUNNEL   NOSE SURGERY   PARATHYROID GLAND SURGERY   PARATHYROIDECTOMY   SPINAL CORD STIMULATOR IMPLANT 11/21/2011  Procedure: SPINAL CORD STIMULATOR PERMANENT; Surgeon: Prudy Feeler, MD; Location: [facility]; Service: Physiatry; Laterality: N/A; [employer]   SPINE SURGERY 2011  Fusion L4-L5   SPINE SURGERY 2003  Decompression L4-L5   TONSILLECTOMY   TONSILLECTOMY   WISDOM TOOTH EXTRACTION   WRIST SURGERY   WRIST SURGERY   No family history of bleeding disorders, wound healing problems or difficulty with anesthesia.   Social History   Socioeconomic History   Marital status: Unknown  Spouse name: Not on file   Number of children: Not on file   Years of education: Not on file   Highest education level: Not on file  Occupational History   Not on file  Tobacco Use   Smoking status: Former  Packs/day: 0.50  Years: 30.00  Additional pack years: 0.00  Total pack years:  15.00  Types: Cigarettes   Smokeless tobacco: Never  Substance and Sexual Activity   Alcohol use: No   Drug use: No   Sexual activity: Not on file  Other Topics Concern   Not on file  Social History Narrative   Not on file   Social Determinants of Health   Food Insecurity: Not on file  Transportation Needs: Not on file  Living Situation: Not on file   Current Outpatient Medications:   alendronate (FOSAMAX) 70 MG tablet, Take 70 mg by mouth every 7 days. Take in the morning with a full glass of water, on an empty stomach, and do not take anything else by mouth or lie down for the next 30 min., Disp: , Rfl:   ALPRAZolam (XANAX) 1 MG tablet, , Disp: , Rfl:   amLODIPine (NORVASC) 5 MG tablet, Take by mouth., Disp: , Rfl:   anastrozole (ARIMIDEX) 1 mg tablet, TAKE 1 TABLET (1 MG TOTAL) BY MOUTH DAILY., Disp: , Rfl: 2  ascorbic acid, vitamin C, (VITAMIN C) 1000 MG tablet, Take by mouth., Disp: , Rfl:   aspirin 81 MG chewable tablet *ANTIPLATELET*, Take by mouth., Disp: , Rfl:   azithromycin (ZITHROMAX) 500 MG tablet, TAKE 1 TABLET ( 500 MG TOTAL) BY MOUTH DAILY, Disp: , Rfl: 0  calcium carbonate (OS-CAL) 600 mg (1,500 mg) Tab, Take 600 mg by mouth daily., Disp: , Rfl:   cyclobenzaprine (FLEXERIL) 5  MG tablet, Take by mouth., Disp: , Rfl:   erythromycin (ROMYCIN) 5 mg/gram (0.5 %) ophthalmic ointment, , Disp: , Rfl:   exemestane (AROMASIN) 25 mg tablet, , Disp: , Rfl:   ezetimibe (ZETIA) 10 mg tablet, Take by mouth., Disp: , Rfl:   fentaNYL (DURAGESIC) 75 mcg/hr patch, Place 1 patch onto the skin every third day., Disp: , Rfl:   flaxseed oil 1,000 mg Cap, Take 1 capsule by mouth 3 times daily., Disp: , Rfl:   furosemide (LASIX) 80 MG tablet, Take 80 mg by mouth daily., Disp: , Rfl:   gabapentin (NEURONTIN) 600 MG tablet, Take 1 capsule in the AM and afternoon, and 2 at bedtime. Total 4 capsules per day., Disp: , Rfl:   hydrocodone-chlorpheniramine (TUSSIONEX) 10-8 mg/5 mL Su12 suspension,  TAKE 5ML BY MOUTH EVERY 12 HOURS AS NEEDED FOR COUGH, Disp: , Rfl: 0  Lactobacillus acidophilus (ACIDOPHILUS) Cap, Take 1 tablet by mouth., Disp: , Rfl:   lidocaine (LIDODERM) 5 %(700 mg/patch) patch, Place 3 patches onto the skin daily. As needed, Disp: , Rfl:   meclizine (ANTIVERT) 25 mg tablet, Take by mouth., Disp: , Rfl:   multivitamin (MULTIVITAMIN) per tablet, Take 1 tablet by mouth daily., Disp: , Rfl:   NIFEdipine (PROCARDIA) 10 MG capsule, Take 10 mg by mouth 3 times daily., Disp: , Rfl:   OMEGA-3/DHA/EPA/FISH OIL (FISH OIL-OMEGA-3 FATTY ACIDS) 300-1,000 mg capsule, Take 4 g by mouth daily., Disp: , Rfl:   oxyCODONE-acetaminophen (PERCOCET) 10-325 mg per tablet, TAKE 1 TABLET BY MOUTH FIVE TIMES DAILY, Disp: , Rfl: 0  potassium chloride (KLOR-CON-M, K-DUR) 20 MEQ extended release tablet, Take by mouth., Disp: , Rfl:   predniSONE (DELTASONE) 20 MG tablet, TAKE 3 TABLETS ( 60 MG TOTAL) BY MOUTH DAILY WITH BREAKFAST, Disp: , Rfl: 0  PROAIR HFA 90 mcg/actuation inhaler, INHALE 2 PUFFS INTO THE LUNGS EVERY 4 HOURS AS NEEDED, Disp: , Rfl: 0  ramipril (ALTACE) 10 MG capsule, Take by mouth., Disp: , Rfl:   rosuvastatin (CRESTOR) 20 MG tablet, Take 20 mg by mouth daily., Disp: , Rfl:   tiZANidine (ZANAFLEX) 4 MG tablet, Take 4 mg by mouth 2 times daily., Disp: , Rfl:   tobramycin-dexamethasone (TOBRADEX) 0.3-0.1 % ophthalmic solution, , Disp: , Rfl:   traZODone (DESYREL) 100 MG tablet, Take 300 mg by mouth nightly as needed., Disp: , Rfl:   venlafaxine (EFFEXOR-XR) 150 MG 24 hr capsule, Take 150 mg by mouth daily., Disp: , Rfl:   A complete ROS was performed with pertinent positives/negatives noted in the HPI. The remainder of the ROS are negative.   Physical Exam:   There were no vitals taken for this visit.  General: Healthy and alert, in no distress, breathing easily. Normal affect. In a pleasant mood. Head: Normocephalic, atraumatic. No masses, or scars. Eyes: Pupils are equal, and  reactive to light. Vision is grossly intact. No spontaneous or gaze nystagmus. Ears: Ear canals are clear. Tympanic membranes are intact, with normal landmarks and the middle ears are clear and healthy. Hearing: Grossly normal. Nose: Nasal cavities are clear with healthy mucosa, no polyps or exudate. Airways are patent. Face: No masses or scars, facial nerve function is symmetric. Oral Cavity: No mucosal abnormalities are noted. Tongue with normal mobility. Dentition appears healthy. Oropharynx: Tonsils are symmetric. There are no mucosal masses identified. Tongue base appears normal and healthy. Larynx/Hypopharynx: deferred Chest: Deferred Neck: Small rubbery left parotid tail mass, otherwise no palpable masses, no cervical adenopathy, no thyroid nodules or  enlargement. Neuro: Cranial nerves II-XII with normal function. Balance: Normal gate. Other findings: none.  Independent Review of Additional Tests or Records:  CT neck:  IMPRESSION:  1. Palpable complaint is a 13 mm mass/neoplasm in the left parotid  tail.  2. No adenopathy in the neck or upper chest to correlate with breast  cancer history.   Procedures:  none  Impression & Plans:  Parotid tail mass on the left. We discussed that these are benign 9 out of 10 times and cancer is 1 out of 10 times. She is in relatively good health. Over time these can get larger and can turn into cancer if not removed. Recommend surgical removal using superficial parotidectomy with facial nerve dissection. Risks and benefits of surgery were discussed in detail. All questions were answered.

## 2022-06-05 NOTE — Pre-Procedure Instructions (Signed)
Surgical Instructions    Your procedure is scheduled on Wednesday, March 13.  Report to [facility] Main Entrance "A" at 10:10 A.M., then check in with the Admitting office.  Call this number if you have problems the morning of surgery:  [phone]   If you have any questions prior to your surgery date call [phone]: Open Monday-Friday 8am-4pm If you experience any cold or flu symptoms such as cough, fever, chills, shortness of breath, etc. between now and your scheduled surgery, please notify us at the above number     Remember:  Do not eat or drink after midnight the night before your surgery     Take these medicines the morning of surgery with A SIP OF WATER:  amLODipine (NORVASC)  brexpiprazole (REXULTI)  gabapentin (NEURONTIN)  venlafaxine (EFFEXOR-XR)   If needed: ALPRAZolam (XANAX)  ipratropium (ATROVENT) 0.06 % nasal spray  meclizine (ANTIVERT)  metaxalone (SKELAXIN)  oxyCODONE-acetaminophen (PERCOCET)  PROAIR HFA 108 (90 Base) MCG/ACT inhaler - Please bring all inhalers with you the day of surgery.    As of today, STOP taking any Aspirin (unless otherwise instructed by your surgeon) Aleve, Naproxen, Ibuprofen, Motrin, Advil, Goody's, BC's, all herbal medications, fish oil, and all vitamins.            [facility] is not responsible for any belongings or valuables.    Do NOT Smoke (Tobacco/Vaping)  24 hours prior to your procedure  If you use a CPAP at night, you may bring your mask for your overnight stay.   Contacts, glasses, hearing aids, dentures or partials may not be worn into surgery, please bring cases for these belongings   For patients admitted to the hospital, discharge time will be determined by your treatment team.   Patients discharged the day of surgery will not be allowed to drive home, and someone needs to stay with them for 24 hours.   SURGICAL WAITING ROOM VISITATION Patients having surgery or a procedure may have no more than 2 support  people in the waiting area - these visitors may rotate.   Children under the age of 69 must have an adult with them who is not the patient. If the patient needs to stay at the hospital during part of their recovery, the visitor guidelines for inpatient rooms apply. Pre-op nurse will coordinate an appropriate time for 1 support person to accompany patient in pre-op.  This support person may not rotate.   Please refer to [email or web address] for the visitor guidelines for Inpatients (after your surgery is over and you are in a regular room).    Special instructions:    Oral Hygiene is also important to reduce your risk of infection.  Remember - BRUSH YOUR TEETH THE MORNING OF SURGERY WITH YOUR REGULAR TOOTHPASTE   [facility]- Preparing For Surgery  Before surgery, you can play an important role. Because skin is not sterile, your skin needs to be as free of germs as possible. You can reduce the number of germs on your skin by washing with CHG (chlorahexidine gluconate) Soap before surgery.  CHG is an antiseptic cleaner which kills germs and bonds with the skin to continue killing germs even after washing.     Please do not use if you have an allergy to CHG or antibacterial soaps. If your skin becomes reddened/irritated stop using the CHG.  Do not shave (including legs and underarms) for at least 48 hours prior to first CHG shower. It is OK to shave your face.  Please follow these instructions carefully.     Shower the NIGHT BEFORE SURGERY and the MORNING OF SURGERY with CHG Soap.   If you chose to wash your hair, wash your hair first as usual with your normal shampoo. After you shampoo, rinse your hair and body thoroughly to remove the shampoo.  Then [employer] and genitals (private parts) with your normal soap and rinse thoroughly to remove soap.  After that Use CHG Soap as you would any other liquid soap. You can apply CHG directly to the  skin and wash gently with a scrungie or a clean washcloth.   Apply the CHG Soap to your body ONLY FROM THE NECK DOWN.  Do not use on open wounds or open sores. Avoid contact with your eyes, ears, mouth and genitals (private parts). Wash Face and genitals (private parts)  with your normal soap.   Wash thoroughly, paying special attention to the area where your surgery will be performed.  Thoroughly rinse your body with warm water from the neck down.  DO NOT shower/wash with your normal soap after using and rinsing off the CHG Soap.  Pat yourself dry with a CLEAN TOWEL.  Wear CLEAN PAJAMAS to bed the night before surgery  Place CLEAN SHEETS on your bed the night before your surgery  DO NOT SLEEP WITH PETS.   Day of Surgery:  Take a shower with CHG soap. Wear Clean/Comfortable clothing the morning of surgery Do not wear jewelry or makeup. Do not wear lotions, powders, perfumes/cologne or deodorant. Do not shave 48 hours prior to surgery.  Men may shave face and neck. Do not bring valuables to the hospital. Do not wear nail polish, gel polish, artificial nails, or any other type of covering on natural nails (fingers and toes) If you have artificial nails or gel coating that need to be removed by a nail salon, please have this removed prior to surgery. Artificial nails or gel coating may interfere with anesthesia's ability to adequately monitor your vital signs. Remember to brush your teeth WITH YOUR REGULAR TOOTHPASTE.    If you received a COVID test during your pre-op visit, it is requested that you wear a mask when out in public, stay away from anyone that may not be feeling well, and notify your surgeon if you develop symptoms. If you have been in contact with anyone that has tested positive in the last 10 days, please notify your surgeon.    Please read over the following fact sheets that you were given.

## 2022-06-06 ENCOUNTER — Other Ambulatory Visit: Payer: Self-pay

## 2022-06-06 ENCOUNTER — Encounter (HOSPITAL_COMMUNITY)
Admission: RE | Admit: 2022-06-06 | Discharge: 2022-06-06 | Disposition: A | Discharge status: Home / Self Care | Payer: PPO | Source: Ambulatory Visit | Attending: Otolaryngology | Admitting: Otolaryngology

## 2022-06-06 ENCOUNTER — Encounter (HOSPITAL_COMMUNITY): Payer: Self-pay

## 2022-06-06 VITALS — BP 149/64 | HR 100 | Temp 98.5°F | Resp 17 | Ht 62.0 in | Wt 118.0 lb

## 2022-06-06 DIAGNOSIS — Z01818 Encounter for other preprocedural examination: Secondary | ICD-10-CM

## 2022-06-06 DIAGNOSIS — M25511 Pain in right shoulder: Secondary | ICD-10-CM | POA: Diagnosis not present

## 2022-06-06 DIAGNOSIS — Z01812 Encounter for preprocedural laboratory examination: Secondary | ICD-10-CM | POA: Insufficient documentation

## 2022-06-06 HISTORY — DX: Anemia, unspecified: D64.9

## 2022-06-06 LAB — CBC
HCT: 32 % — ABNORMAL LOW (ref 36.0–46.0)
Hemoglobin: 10.4 g/dL — ABNORMAL LOW (ref 12.0–15.0)
MCH: 30.9 pg (ref 26.0–34.0)
MCHC: 32.5 g/dL (ref 30.0–36.0)
MCV: 95 fL (ref 80.0–100.0)
Platelets: 199 10*3/uL (ref 150–400)
RBC: 3.37 MIL/uL — ABNORMAL LOW (ref 3.87–5.11)
RDW: 14.6 % (ref 11.5–15.5)
WBC: 9.2 10*3/uL (ref 4.0–10.5)
nRBC: 0 % (ref 0.0–0.2)

## 2022-06-06 LAB — BASIC METABOLIC PANEL
Anion gap: 12 (ref 5–15)
BUN: 24 mg/dL — ABNORMAL HIGH (ref 8–23)
CO2: 25 mmol/L (ref 22–32)
Calcium: 9.9 mg/dL (ref 8.9–10.3)
Chloride: 104 mmol/L (ref 98–111)
Creatinine, Ser: 1.76 mg/dL — ABNORMAL HIGH (ref 0.44–1.00)
GFR, Estimated: 30 mL/min — ABNORMAL LOW (ref >60)
Glucose, Bld: 125 mg/dL — ABNORMAL HIGH (ref 70–99)
Potassium: 5.1 mmol/L (ref 3.5–5.1)
Sodium: 141 mmol/L (ref 135–145)

## 2022-06-06 NOTE — Progress Notes (Addendum)
PCP - Mayra Neer MD Cardiologist - denies  PPM/ICD -  Device Orders -  Rep Notified -   Chest x-ray - na EKG - 07/08/21 Stress Test - 12/18/14 ECHO - 12/25/21 Cardiac Cath - denies  Sleep Study - denies CPAP - denies  Fasting Blood Sugar - na Checks Blood Sugar _____ times a day  Last dose of GLP1 agonist-  na GLP1 instructions:   Blood Thinner Instructions:na Aspirin Instructions:pt states she has stopped taking aspirin.   ERAS Protcol -no PRE-SURGERY Ensure or G2-   COVID TEST- na    Anesthesia review: no  Patient denies shortness of breath, fever, cough and chest pain at PAT appointment. Pt denies taking Amlodipine. Unable to reach pharmacy to correct med rec. Pt states that her spinal stimulator is not charged and does not function.    All instructions explained to the patient, with a verbal understanding of the material. Patient agrees to go over the instructions while at home for a better understanding. Patient also instructed to wear a mask when out in public prior to surgery. The opportunity to ask questions was provided.

## 2022-06-09 ENCOUNTER — Encounter: Payer: Self-pay | Admitting: Hematology

## 2022-06-09 ENCOUNTER — Inpatient Hospital Stay: Payer: PPO | Attending: Hematology

## 2022-06-09 ENCOUNTER — Inpatient Hospital Stay: Payer: PPO

## 2022-06-09 ENCOUNTER — Other Ambulatory Visit: Payer: Self-pay

## 2022-06-09 ENCOUNTER — Inpatient Hospital Stay (HOSPITAL_BASED_OUTPATIENT_CLINIC_OR_DEPARTMENT_OTHER): Payer: PPO | Admitting: Hematology

## 2022-06-09 VITALS — BP 155/74 | HR 86 | Temp 98.6°F | Resp 18 | Ht 62.0 in | Wt 116.8 lb

## 2022-06-09 DIAGNOSIS — Z9013 Acquired absence of bilateral breasts and nipples: Secondary | ICD-10-CM | POA: Diagnosis not present

## 2022-06-09 DIAGNOSIS — Z79811 Long term (current) use of aromatase inhibitors: Secondary | ICD-10-CM

## 2022-06-09 DIAGNOSIS — D0511 Intraductal carcinoma in situ of right breast: Secondary | ICD-10-CM

## 2022-06-09 DIAGNOSIS — N189 Chronic kidney disease, unspecified: Secondary | ICD-10-CM | POA: Diagnosis not present

## 2022-06-09 DIAGNOSIS — D638 Anemia in other chronic diseases classified elsewhere: Secondary | ICD-10-CM

## 2022-06-09 DIAGNOSIS — E2839 Other primary ovarian failure: Secondary | ICD-10-CM

## 2022-06-09 DIAGNOSIS — Z170 Estrogen receptor positive status [ER+]: Secondary | ICD-10-CM

## 2022-06-09 DIAGNOSIS — M810 Age-related osteoporosis without current pathological fracture: Secondary | ICD-10-CM

## 2022-06-09 DIAGNOSIS — C50411 Malignant neoplasm of upper-outer quadrant of right female breast: Secondary | ICD-10-CM

## 2022-06-09 DIAGNOSIS — F17210 Nicotine dependence, cigarettes, uncomplicated: Secondary | ICD-10-CM | POA: Diagnosis not present

## 2022-06-09 DIAGNOSIS — R221 Localized swelling, mass and lump, neck: Secondary | ICD-10-CM

## 2022-06-09 DIAGNOSIS — C50212 Malignant neoplasm of upper-inner quadrant of left female breast: Secondary | ICD-10-CM | POA: Diagnosis not present

## 2022-06-09 LAB — CBC WITH DIFFERENTIAL (CANCER CENTER ONLY)
Abs Immature Granulocytes: 0.05 10*3/uL (ref 0.00–0.07)
Basophils Absolute: 0.1 10*3/uL (ref 0.0–0.1)
Basophils Relative: 1 %
Eosinophils Absolute: 0.1 10*3/uL (ref 0.0–0.5)
Eosinophils Relative: 2 %
HCT: 31.8 % — ABNORMAL LOW (ref 36.0–46.0)
Hemoglobin: 10.7 g/dL — ABNORMAL LOW (ref 12.0–15.0)
Immature Granulocytes: 1 %
Lymphocytes Relative: 28 %
Lymphs Abs: 2.6 10*3/uL (ref 0.7–4.0)
MCH: 31.2 pg (ref 26.0–34.0)
MCHC: 33.6 g/dL (ref 30.0–36.0)
MCV: 92.7 fL (ref 80.0–100.0)
Monocytes Absolute: 0.9 10*3/uL (ref 0.1–1.0)
Monocytes Relative: 10 %
Neutro Abs: 5.7 10*3/uL (ref 1.7–7.7)
Neutrophils Relative %: 58 %
Platelet Count: 215 10*3/uL (ref 150–400)
RBC: 3.43 MIL/uL — ABNORMAL LOW (ref 3.87–5.11)
RDW: 14.5 % (ref 11.5–15.5)
WBC Count: 9.5 10*3/uL (ref 4.0–10.5)
nRBC: 0 % (ref 0.0–0.2)

## 2022-06-09 LAB — CMP (CANCER CENTER ONLY)
ALT: 16 U/L (ref 0–44)
AST: 20 U/L (ref 15–41)
Albumin: 4.2 g/dL (ref 3.5–5.0)
Alkaline Phosphatase: 54 U/L (ref 38–126)
Anion gap: 5 (ref 5–15)
BUN: 32 mg/dL — ABNORMAL HIGH (ref 8–23)
CO2: 28 mmol/L (ref 22–32)
Calcium: 9.8 mg/dL (ref 8.9–10.3)
Chloride: 108 mmol/L (ref 98–111)
Creatinine: 1.49 mg/dL — ABNORMAL HIGH (ref 0.44–1.00)
GFR, Estimated: 37 mL/min — ABNORMAL LOW (ref >60)
Glucose, Bld: 74 mg/dL (ref 70–99)
Potassium: 4.3 mmol/L (ref 3.5–5.1)
Sodium: 141 mmol/L (ref 135–145)
Total Bilirubin: 0.3 mg/dL (ref 0.3–1.2)
Total Protein: 6.6 g/dL (ref 6.5–8.1)

## 2022-06-09 LAB — FERRITIN: Ferritin: 77 ng/mL (ref 11–307)

## 2022-06-09 MED ORDER — ANASTROZOLE 1 MG PO TABS: 1.0000 mg | ORAL_TABLET | Freq: Every day | ORAL | 3 refills | Status: DC

## 2022-06-09 MED ORDER — SODIUM CHLORIDE 0.9 % IV SOLN: Freq: Once | INTRAVENOUS | Status: AC

## 2022-06-09 MED ORDER — ZOLEDRONIC ACID 4 MG/100ML IV SOLN: 4.0000 mg | Freq: Once | INTRAVENOUS | Status: DC

## 2022-06-09 NOTE — Assessment & Plan Note (Addendum)
-  present since at least 05/2021 -she feels it has gotten larger and notes it is tender. She denies dental concerns. She notes she continues smoking but is down to 3 cigarettes a day. -I discussed her neck ultrasound from yesterday, which showed a 1.3 cm mass in the left parotid gland, slightly enlarged since 2022. -I discussed her recent CT neck scan findings, which showed a 1.3 cm mass in left parotid gland.  She was referred to ENT Dr. Constance Holster and scheduled for surgery 06/11/2022

## 2022-06-09 NOTE — Assessment & Plan Note (Signed)
-  diagnosed in 2016 -She is s/p right lumpectomy and multiple re-excision which resulted in her ultimately having a right breast mastectomy which cured her.

## 2022-06-09 NOTE — Progress Notes (Signed)
[place]   [phone] [phone]   Clinic Follow up Note   Patient Care Team: Mayra Neer, MD as PCP - General (Family Medicine) Holley Bouche, NP (Inactive) as Nurse Practitioner (Nurse Practitioner) [name], MD as Consulting Physician (General Surgery) Truitt Merle, MD as Consulting Physician (Hematology) Sylvan Cheese, NP as Nurse Practitioner (Hematology and Oncology) Crissie Reese, MD as Consulting Physician (Plastic Surgery) Izora Gala, MD as Consulting Physician (Otolaryngology)  Date of Service:  06/09/2022  I connected with Connie Bennett on 06/09/2022 at  2:40 PM EDT by telephone visit and verified that I am speaking with the correct person using two identifiers.  I discussed the limitations, risks, security and privacy concerns of performing an evaluation and management service by telephone and the availability of in person appointments. I also discussed with the patient that there may be a patient responsible charge related to this service. The patient expressed understanding and agreed to proceed.   Other persons participating in the visit and their role in the encounter:   Husband  Patient's location:  home Provider's location:  Office  CHIEF COMPLAINT: f/u of bilateral breast cancer   CURRENT THERAPY:  Anastrozole 1 mg daily, started on 09/28/15 -Zometa, q34month, starting 10/2020     ASSESSMENT & PLAN:  Connie KARONis a 72y.o. female with   Breast cancer of upper-inner quadrant of left female breast ([place] Invasive ductal carcinoma, pT2N0M0, stage IIA, G2, ER+/PR-/HER2-, ONCOTYPE RS 28  -diagnosed in 05/2015. S/p left breast mastectomy.  -Oncotype RS showed moderate risk, chemo ultimately not pursued. -She started anti-estrogen therapy with exemestane in 07/2015, but stopped after 1 month use due to skin rash. She started anastrozole in 08/2015, plan for 7 years and to complete in July 2024. She  is tolerating well overall. -she is clinically doing very well, from a breast cancer standpoint. Labs reviewed, overall stable. Breast exam was unremarkable. There is no clinical concern for recurrence.  Ductal carcinoma in situ (DCIS) of right breast -diagnosed in 2016 -She is s/p right lumpectomy and multiple re-excision which resulted in her ultimately having a right breast mastectomy which cured her.   Osteoporosis -Her 11/2017 DEXA shows Osteopenia with lowest T-score -2.2 at left femur neck. Repeat in February 2022 showed worsening with T score -2.9 at the left femur neck, she has osteoporosis now -she began Zometa q613monthon 11/07/20. Plan for 2 years  -Lab reviewed, creatinine 1.49, GFR 37, due to her chronic kidney disease, I will cancel her last Zometa infusion today.  parotid gland mass -present since at least 05/2021 -she feels it has gotten larger and notes it is tender. She denies dental concerns. She notes she continues smoking but is down to 3 cigarettes a day. -I discussed her neck ultrasound from yesterday, which showed a 1.3 cm mass in the left parotid gland, slightly enlarged since 2022. -I discussed her recent CT neck scan findings, which showed a 1.3 cm mass in left parotid gland.  She was referred to ENT Dr. RoConstance Holsternd scheduled for surgery 06/11/2022    PLAN: - continue Anastrozole and she will complete a 7 years therapy in June 2024 - due last dose of Zometa today, will cancel due to her CKD. - f/u in one year with LaCira RueP - reviewed labs - order bone density test to be done at [facility] in next few months     SUMMARY OF ONCOLOGIC HISTORY: Oncology History Overview Note  Breast cancer of upper-outer quadrant of RIGHT female breast [facility])   Staging form: Breast, AJCC 7th Edition Clinical stage from 09/06/2014: Stage 0 (Tis (DCIS), N0, M0)  Pathologic stage from 01/25/2015: Stage 0 (Tis (DCIS), N0, cM0)  ------  Breast cancer of upper-inner quadrant of LEFT  female breast ([place])   Staging form: Breast, AJCC 7th Edition Clinical stage from 06/20/2015: Stage IA (T1c, N0, M0)      Pathologic stage from 07/05/2015: Stage IIA (T2, N0, cM0)    Ductal carcinoma in situ (DCIS) of right breast  08/23/2014 Mammogram   Mammogram and ultrasound showed a 3cm cystic lesion at 8:30 of right breast, and a 7 mm and a 5 mm increased density lesion at 10:00 of the right breast.   08/29/2014 Initial Biopsy   Right breast core needle bx x 2: high-grade DCIS, with focus of mildly suspicious for early stromal invasion. ER- (0%), PR- (0%).    08/29/2014 Clinical Stage   Stage 0: Tis N0   09/13/2014 Surgery   Right lumpectomy/SLNB ([place]): High grade DCIS with necrosis and calcfications, positive margins. 5 LN removed and negative for malignancy (0/5). Grade 3.   09/13/2014 Pathologic Stage   Stage 0: Tis N0   09/29/2014 Surgery   Re-excision for positive margins; multiple breast margins re-excised, high-grade DCIS, measuring 1.0 cm, 3.0 cm, 2.0 cm, some margins are still positive or very close.    01/25/2015 Definitive Surgery   Right nipple-sparing mastectomy [facility]): DCIS with calcifications, high grade, spans 9 cm, LCIS, fibrocystic changes with adenosis and calcifications, negative margins. 1 LN removed and negative. Imeediate reconstruction   03/23/2015 Survivorship   Survivorship care plan completed and mailed to patient in lieu of in person visit at request of patient.   04/23/2021 Imaging    IMPRESSION: 1. Palpable complaint is a 13 mm mass/neoplasm in the left parotid tail. 2. No adenopathy in the neck or upper chest to correlate with breast cancer history.   Breast cancer of upper-inner quadrant of left female breast ([place])  06/28/2013 Imaging   DEXA scan: Osteopenia [name])   06/19/2015 Mammogram   Diagnostic mammogram and ultrasound showed a 1.3 cm in the upper inner quadrant of left breast, and a 0.9 mm simple cyst. Axilla was negative.    06/20/2015 Receptors her2   ER 100% positive, PR negative, Ki-67 30%, HER-2 negative   06/20/2015 Initial Biopsy   The left breast upper inner quadrant mass biopsy showed invasive ductal carcinoma, grade 2   06/20/2015 Initial Diagnosis   Breast cancer of upper-inner quadrant of left female breast ([place])   07/05/2015 Surgery   Left breast nipple-sparing mastectomy and sentinel lymph node biopsy with immediate reconstruction [facility]ienes); [facility] with [facility]. Harlow Mares at [facility]   07/05/2015 Pathology Results   Left mastectomy: Invasive ductal carcinoma with extracellular mucin, grade 2, spanning 2.0 cm, margins were negative, lobular carcinoma in situ, 3 sentinel lymph nodes negative. PR repeated and remains negative. HER2 remains neg (ratio 1.21)   07/05/2015 Pathologic Stage   pT2, pN0: Stage IIA    07/05/2015 Oncotype testing   RS 28 (intermediate risk), which predicts 10 year risk of distant recurrence 18% with tamoxifen alone   07/30/2015 - 08/30/2015 Anti-estrogen oral therapy   Adjuvant exemestane 25 mg once daily, stopped after one month due to skin rash.    09/01/2015 -  Anti-estrogen oral therapy   Switched to anastrozole '1mg'$  daily.  Planned duration of treatment : 5-10 years [facility])  INTERVAL HISTORY:  Connie Bennett was contacted for a follow up of bilateral breast cancer . She was last seen by me on 04/24/2022.  Patient presents to clinic alone. Patient states she will be having surgery on lump in left jaw to remove it. Overall patient is doing well, nothing new since last visit.   All other systems were reviewed with the patient and are negative.  MEDICAL HISTORY:  Past Medical History:  Diagnosis Date   Anemia    Anxiety    takes Xanax daily as needed   Arthritis    Bilateral lower extremity edema    takes Furosemide daily as needed   Breast cancer [facility]) oncologist-  dr Truitt Merle--  right upper-outer quadrant    dx May 2016--- Stage 0  (Tis,N0,M0)  DCIS  Right breast---   09-13-2014  s/p  radioactive seed/ partial mastectomy / sln bx   Chronic fatigue syndrome    Chronic low back pain    Complication of anesthesia    spinal cord stimulator- to be off for surgery   Depression    takes Effexor daily   Family history of adverse reaction to anesthesia    mom hard to wake up   Fibromyalgia    Headache(784.0)    couple of times a week   Heart murmur    History of bronchitis > 13yr ago   History of colon polyps    benign   History of drug overdose    oxycontin  and oxycodone  Sept 2015--  now has narcan injection prescription   History of DVT of lower extremity    2008--  BILATERAL   HTN (hypertension)    takes Amlodipine and Ramipril daily   Hyperlipidemia    takes Zetia and Crestor  daily   Joint pain    Mitral valve prolapse    MILD /   PER PT ASYMPTOMATIC   Muscle spasm    takes Zanaflex daily as needed   Osteoporosis    Peripheral neuropathy    takes Neurontin daily   Peripheral vascular disease (HCC)    hx dvt's   RSD (reflex sympathetic dystrophy)    left wrist and forearm from a fx   RSD (reflex sympathetic dystrophy)    [place]'s disease [place]    Vertigo    takes Meclizine daily     SURGICAL HISTORY: Past Surgical History:  Procedure Laterality Date   BACK SURGERY     BREAST RECONSTRUCTION WITH PLACEMENT OF TISSUE EXPANDER AND FLEX HD (ACELLULAR HYDRATED DERMIS) Right 01/25/2015   Procedure: RIGHT BREAST RECONSTRUCTION WITH PLACEMENT OF IMPLANT AND ACELLULAR DERMAL MATRIX ;  Surgeon: DCrissie Reese MD;  [place]  Service: Plastics;  Laterality: Right;   BREAST RECONSTRUCTION WITH PLACEMENT OF TISSUE EXPANDER AND FLEX HD (ACELLULAR HYDRATED DERMIS) Left 07/05/2015   Procedure: LEFT BREAST RECONSTRUCTION WITH PLACEMENT OF TISSUE EXPANDER AND  ACELLULAR DERMAL MATRIX ;  Surgeon: DCrissie Reese MD;  [place] [facility]  Service: Plastics;  Laterality: Left;   COLONOSCOPY     ESOPHAGOGASTRODUODENOSCOPY     eye lid surgery Bilateral     FOOT SURGERY Bilateral 1996   MORTON'S NEUROMA   INNER EAR SURGERY Right    LUMBAR [place]     MASTECTOMY Right 01/25/2015   nipple sparing    NASAL SINUS SURGERY     NIPPLE SPARING MASTECTOMY/SENTINAL LYMPH NODE BIOPSY/RECONSTRUCTION/PLACEMENT OF TISSUE EXPANDER Left 07/05/2015   Procedure: LEFT NIPPLE SPARING MASTECTOMY WITH SENTINAL LYMPH NODE  BIOPSY ;  Surgeon: [name], MD;  Location: [place];  Service: General;  Laterality: Left;   OPEN REDUCTION INTERNAL FIXATION (ORIF) DISTAL RADIAL FRACTURE Right 08/09/2018   Procedure: OPEN REDUCTION INTERNAL FIXATION (ORIF) DISTAL RADIAL FRACTURE;  Surgeon: Milly Jakob, MD;  Location: [place];  Service: Orthopedics;  Laterality: Right;   ORIF LEFT WRIST FX'S/  LEFT CARPAL TUNNEL RELEASE  1999   POSTERIOR LUMBAR FUSION  01-30-2009   L4--5 Laminectmy w/ decompression/  bilateral L4--5 microdiskectomy and fusion   RADIOACTIVE SEED GUIDED PARTIAL MASTECTOMY WITH AXILLARY SENTINEL LYMPH NODE BIOPSY Right 09/13/2014   Procedure: RADIOACTIVE SEED GUIDED PARTIAL MASTECTOMY WITH AXILLARY SENTINEL LYMPH NODE BIOPSY;  Surgeon: [name], MD;  Location: [place];  Service: General;  Laterality: Right;   RE-EXCISION OF BREAST LUMPECTOMY Right 09/29/2014   Procedure: RE-EXCISION OF BREAST LUMPECTOMY;  Surgeon: [name], MD;  Location: [place];  Service: General;  Laterality: Right;   RIGHT CARPAL TUNNEL RELEASE/  TRIGGER RELEASE RIGHT MIDDLE FINGER  06-18-2006   RIGHT INFERIOR PARATHYROIDECTOMY  04-07-2006   SIMPLE MASTECTOMY WITH AXILLARY SENTINEL NODE BIOPSY Right 01/25/2015   Procedure: RIGHT NIPPLE [place];  Surgeon: [name], MD;  Location: [place];  Service: General;  Laterality: Right;   SPINAL CORD STIMULATOR IMPLANT  2013   TONSILLECTOMY  as child    I have reviewed the social history and family history with the patient and they are unchanged from previous  note.  ALLERGIES:  is allergic to carbocaine [mepivacaine hcl], penicillins, sulfa antibiotics, and latex.  MEDICATIONS:  Current Outpatient Medications  Medication Sig Dispense Refill   ALPRAZolam (XANAX) 1 MG tablet Take 1 mg by mouth 3 (three) times daily as needed for anxiety or sleep (nerves).     amLODipine (NORVASC) 10 MG tablet Take 1 tablet (10 mg total) by mouth daily. 30 tablet 1   anastrozole (ARIMIDEX) 1 MG tablet Take 1 tablet (1 mg total) by mouth daily. 90 tablet 3   Ascorbic Acid (VITAMIN C) 1000 MG tablet Take 1,000 mg by mouth daily with lunch.     aspirin EC 81 MG tablet Take 81 mg by mouth daily. Swallow whole.     B Complex-C (B-COMPLEX WITH VITAMIN C) tablet Take 1 tablet by mouth daily. 120 tablet 0   brexpiprazole (REXULTI) 1 MG TABS tablet Take 1 mg by mouth 2 (two) times daily with breakfast and lunch.     calcium carbonate (OS-CAL) 600 MG TABS tablet Take 600 mg by mouth daily with lunch.     Cholecalciferol (VITAMIN D) 50 MCG (2000 UT) tablet Take 2,000 Units by mouth daily with lunch.     ezetimibe (ZETIA) 10 MG tablet Take 10 mg by mouth at bedtime.      ferrous sulfate 325 (65 FE) MG tablet Take 1 tablet (325 mg total) by mouth daily with breakfast. 60 tablet 0   Flaxseed, Linseed, (FLAX SEED OIL) 1000 MG CAPS Take 1,000 mg by mouth daily with lunch.     gabapentin (NEURONTIN) 600 MG tablet Take 1 tablet (600 mg total) by mouth at bedtime for 14 days. Take one tablet (600 mg) by mouth every morning and two tablets (1200 mg) at night (Patient taking differently: Take 600-1,200 mg by mouth See admin instructions. Take one tablet (600 mg) by mouth every morning, one tablet (600 mg) at lunch and two tablets (1200 mg) at night) 14 tablet 0   ipratropium (ATROVENT) 0.06 %  nasal spray Place 1 spray into both nostrils 3 (three) times daily as needed (allergies).     Lactobacillus (PROBIOTIC ACIDOPHILUS PO) Take 1 tablet by mouth daily with lunch.     lidocaine (LIDODERM)  5 % Place 1 patch onto the skin at bedtime as needed (pain).     meclizine (ANTIVERT) 25 MG tablet Take 25 mg by mouth 4 (four) times daily as needed for dizziness.     metaxalone (SKELAXIN) 800 MG tablet Take 800 mg by mouth 3 (three) times daily as needed for muscle spasms.     naloxegol oxalate (MOVANTIK) 25 MG TABS tablet Take 25 mg by mouth daily as needed (for constipation).     naloxone (NARCAN) nasal spray 4 mg/0.1 mL Place 1 spray into the nose once as needed (for opioid overdose).     Omega-3 Fatty Acids (FISH OIL) 1000 MG CAPS Take 1,000 mg by mouth daily with lunch.     oxyCODONE-acetaminophen (PERCOCET) 10-325 MG tablet Take 1 tablet by mouth 5 (five) times daily as needed for pain.     polyethylene glycol (MIRALAX / GLYCOLAX) 17 g packet Take 17 g by mouth daily as needed for mild constipation. 14 each 0   PROAIR HFA 108 (90 Base) MCG/ACT inhaler Inhale 2 puffs into the lungs every 4 (four) hours as needed for wheezing or shortness of breath.  2   ramipril (ALTACE) 10 MG capsule Take 10 mg by mouth daily.     rosuvastatin (CRESTOR) 20 MG tablet Take 20 mg by mouth at bedtime.     venlafaxine (EFFEXOR-XR) 150 MG 24 hr capsule Take 150 mg by mouth 2 (two) times daily with breakfast and lunch.     No current facility-administered medications for this visit.    PHYSICAL EXAMINATION: ECOG PERFORMANCE STATUS: 0 - Asymptomatic  Vitals:   06/09/22 1446  BP: (!) 155/74  Pulse: 86  Resp: 18  Temp: 98.6 F (37 C)  SpO2: 99%   Wt Readings from Last 3 Encounters:  06/09/22 116 lb 12.8 oz (53 kg)  06/06/22 118 lb (53.5 kg)  01/28/22 111 lb (50.3 kg)     No vitals taken today, Exam not performed today  LABORATORY DATA:  I have reviewed the data as listed    Latest Ref Rng & Units 06/09/2022    2:20 PM 06/06/2022    2:54 PM 12/31/2021   10:58 AM  CBC  WBC 4.0 - 10.5 K/uL 9.5  9.2  6.4   Hemoglobin 12.0 - 15.0 g/dL 10.7  10.4  9.0   Hematocrit 36.0 - 46.0 % 31.8  32.0  27.5    Platelets 150 - 400 K/uL 215  199  338         Latest Ref Rng & Units 06/09/2022    2:20 PM 06/06/2022    2:54 PM 04/23/2022    8:40 AM  CMP  Glucose 70 - 99 mg/dL 74  125    BUN 8 - 23 mg/dL 32  24    Creatinine 0.44 - 1.00 mg/dL 1.49  1.76  1.50   Sodium 135 - 145 mmol/L 141  141    Potassium 3.5 - 5.1 mmol/L 4.3  5.1    Chloride 98 - 111 mmol/L 108  104    CO2 22 - 32 mmol/L 28  25    Calcium 8.9 - 10.3 mg/dL 9.8  9.9    Total Protein 6.5 - 8.1 g/dL 6.6     Total Bilirubin 0.3 -  1.2 mg/dL 0.3     Alkaline Phos 38 - 126 U/L 54     AST 15 - 41 U/L 20     ALT 0 - 44 U/L 16         RADIOGRAPHIC STUDIES: I have personally reviewed the radiological images as listed and agreed with the findings in the report. No results found.    Orders Placed This Encounter  Procedures   DG Bone Density    Standing Status:   Future    Standing Expiration Date:   06/09/2023    Scheduling Instructions:     [facility]    Order Specific Question:   Reason for Exam (SYMPTOM  OR DIAGNOSIS REQUIRED)    Answer:   screening    Order Specific Question:   Preferred imaging location?    Answer:   External   Amb Referral to Survivorship Long term    Referral Priority:   Routine    Referral Type:   Consultation    Number of Visits Requested:   1   All questions were answered. The patient knows to call the clinic with any problems, questions or concerns. No barriers to learning was detected. The total time spent in the appointment was 21 minutes.     Truitt Merle, MD 06/09/2022   I, Maurine Simmering, CMA am acting as scribe for Truitt Merle, MD.   I have reviewed the above documentation for accuracy and completeness, and I agree with the above.

## 2022-06-09 NOTE — Assessment & Plan Note (Signed)
Invasive ductal carcinoma, pT2N0M0, stage IIA, G2, ER+/PR-/HER2-, ONCOTYPE RS 28  -diagnosed in 05/2015. S/p left breast mastectomy.  -Oncotype RS showed moderate risk, chemo ultimately not pursued. -She started anti-estrogen therapy with exemestane in 07/2015, but stopped after 1 month use due to skin rash. She started anastrozole in 08/2015, plan for 7 years. She is tolerating well overall. -she is clinically doing very well, from a breast cancer standpoint. Labs reviewed, overall stable. Breast exam was unremarkable. There is no clinical concern for recurrence. 

## 2022-06-09 NOTE — Assessment & Plan Note (Signed)
-  Her 11/2017 DEXA shows Osteopenia with lowest T-score -2.2 at left femur neck. Repeat in February 2022 showed worsening with T score -2.9 at the left femur neck, she has osteoporosis now -she began Zometa q8months on 11/07/20. Plan for 2 years

## 2022-06-10 NOTE — Progress Notes (Signed)
Left a message to arrive tom at [place].

## 2022-06-11 ENCOUNTER — Encounter (HOSPITAL_COMMUNITY)
Admission: RE | Disposition: A | Discharge status: Home / Self Care | Payer: Self-pay | Source: Ambulatory Visit | Attending: Otolaryngology

## 2022-06-11 ENCOUNTER — Ambulatory Visit (HOSPITAL_BASED_OUTPATIENT_CLINIC_OR_DEPARTMENT_OTHER): Payer: PPO | Admitting: Anesthesiology

## 2022-06-11 ENCOUNTER — Ambulatory Visit (HOSPITAL_COMMUNITY): Payer: PPO | Admitting: Anesthesiology

## 2022-06-11 ENCOUNTER — Observation Stay (HOSPITAL_COMMUNITY)
Admission: RE | Admit: 2022-06-11 | Discharge: 2022-06-11 | Disposition: A | Discharge status: Home / Self Care | Payer: PPO | Source: Ambulatory Visit | Attending: Otolaryngology | Admitting: Otolaryngology

## 2022-06-11 ENCOUNTER — Other Ambulatory Visit: Payer: Self-pay

## 2022-06-11 DIAGNOSIS — Z7982 Long term (current) use of aspirin: Secondary | ICD-10-CM | POA: Diagnosis not present

## 2022-06-11 DIAGNOSIS — K118 Other diseases of salivary glands: Secondary | ICD-10-CM | POA: Diagnosis present

## 2022-06-11 DIAGNOSIS — Z79899 Other long term (current) drug therapy: Secondary | ICD-10-CM | POA: Diagnosis not present

## 2022-06-11 DIAGNOSIS — I10 Essential (primary) hypertension: Secondary | ICD-10-CM | POA: Diagnosis not present

## 2022-06-11 DIAGNOSIS — R221 Localized swelling, mass and lump, neck: Secondary | ICD-10-CM | POA: Diagnosis not present

## 2022-06-11 DIAGNOSIS — D649 Anemia, unspecified: Secondary | ICD-10-CM | POA: Diagnosis not present

## 2022-06-11 DIAGNOSIS — F17210 Nicotine dependence, cigarettes, uncomplicated: Secondary | ICD-10-CM | POA: Diagnosis not present

## 2022-06-11 DIAGNOSIS — Z87891 Personal history of nicotine dependence: Secondary | ICD-10-CM | POA: Diagnosis not present

## 2022-06-11 DIAGNOSIS — I739 Peripheral vascular disease, unspecified: Secondary | ICD-10-CM | POA: Diagnosis not present

## 2022-06-11 DIAGNOSIS — F17200 Nicotine dependence, unspecified, uncomplicated: Secondary | ICD-10-CM | POA: Diagnosis not present

## 2022-06-11 HISTORY — PX: PAROTIDECTOMY: SHX2163

## 2022-06-11 SURGERY — EXCISION, PAROTID GLAND
Anesthesia: General | Site: Neck | Laterality: Left

## 2022-06-11 MED ORDER — CALCIUM CARBONATE 1250 (500 CA) MG PO TABS: 1.0000 | ORAL_TABLET | Freq: Every day | ORAL | Status: DC

## 2022-06-11 MED ORDER — AMLODIPINE BESYLATE 10 MG PO TABS
10.0000 mg | ORAL_TABLET | Freq: Every day | ORAL | Status: DC
  Administered 2022-06-11: 10 mg via ORAL
  Filled 2022-06-11: qty 1

## 2022-06-11 MED ORDER — VITAMIN C 500 MG PO TABS: 1000.0000 mg | ORAL_TABLET | Freq: Every day | ORAL | Status: DC

## 2022-06-11 MED ORDER — ORAL CARE MOUTH RINSE: 15.0000 mL | Freq: Once | OROMUCOSAL | Status: AC

## 2022-06-11 MED ORDER — ACETAMINOPHEN 500 MG PO TABS
1000.0000 mg | ORAL_TABLET | Freq: Once | ORAL | Status: AC
  Administered 2022-06-11: 1000 mg via ORAL

## 2022-06-11 MED ORDER — DEXTROSE-NACL 5-0.9 % IV SOLN: INTRAVENOUS | Status: DC

## 2022-06-11 MED ORDER — ACETAMINOPHEN 325 MG PO TABS: 325.0000 mg | ORAL_TABLET | Freq: Every day | ORAL | Status: DC | PRN

## 2022-06-11 MED ORDER — FERROUS SULFATE 325 (65 FE) MG PO TABS: 325.0000 mg | ORAL_TABLET | Freq: Every day | ORAL | Status: DC

## 2022-06-11 MED ORDER — CHLORHEXIDINE GLUCONATE 0.12 % MT SOLN
15.0000 mL | Freq: Once | OROMUCOSAL | Status: AC
  Administered 2022-06-11: 15 mL via OROMUCOSAL
  Filled 2022-06-11: qty 15

## 2022-06-11 MED ORDER — BUPIVACAINE HCL (PF) 0.25 % IJ SOLN
INTRAMUSCULAR | Status: AC
  Filled 2022-06-11: qty 30

## 2022-06-11 MED ORDER — RISAQUAD PO CAPS: 1.0000 | ORAL_CAPSULE | Freq: Every day | ORAL | Status: DC

## 2022-06-11 MED ORDER — SUCCINYLCHOLINE CHLORIDE 200 MG/10ML IV SOSY
PREFILLED_SYRINGE | INTRAVENOUS | Status: DC | PRN
  Administered 2022-06-11: 100 mg via INTRAVENOUS

## 2022-06-11 MED ORDER — 0.9 % SODIUM CHLORIDE (POUR BTL) OPTIME
TOPICAL | Status: DC | PRN
  Administered 2022-06-11: 1000 mL

## 2022-06-11 MED ORDER — B COMPLEX-C PO TABS
1.0000 | ORAL_TABLET | Freq: Every day | ORAL | Status: DC
  Administered 2022-06-11: 1 via ORAL
  Filled 2022-06-11: qty 1

## 2022-06-11 MED ORDER — NALOXONE HCL 4 MG/0.1ML NA LIQD: 1.0000 | Freq: Once | NASAL | Status: DC | PRN

## 2022-06-11 MED ORDER — GABAPENTIN 300 MG PO CAPS: 600.0000 mg | ORAL_CAPSULE | Freq: Every day | ORAL | Status: DC

## 2022-06-11 MED ORDER — LACTATED RINGERS IV SOLN: INTRAVENOUS | Status: DC

## 2022-06-11 MED ORDER — HYDROMORPHONE HCL 1 MG/ML IJ SOLN
INTRAMUSCULAR | Status: DC | PRN
  Administered 2022-06-11 (×2): .25 mg via INTRAVENOUS

## 2022-06-11 MED ORDER — IPRATROPIUM BROMIDE 0.06 % NA SOLN: 1.0000 | Freq: Three times a day (TID) | NASAL | Status: DC | PRN

## 2022-06-11 MED ORDER — BREXPIPRAZOLE 1 MG PO TABS
1.0000 mg | ORAL_TABLET | Freq: Two times a day (BID) | ORAL | Status: DC
  Filled 2022-06-11: qty 1

## 2022-06-11 MED ORDER — SODIUM CHLORIDE 0.9 % IV SOLN
0.1500 ug/kg/min | INTRAVENOUS | Status: DC
  Administered 2022-06-11: .1 ug/kg/min via INTRAVENOUS
  Filled 2022-06-11 (×2): qty 1000

## 2022-06-11 MED ORDER — VANCOMYCIN HCL 1000 MG IV SOLR: 1000.0000 mg | Freq: Once | INTRAVENOUS | Status: AC

## 2022-06-11 MED ORDER — FLAX SEED OIL 1000 MG PO CAPS: 1000.0000 mg | ORAL_CAPSULE | Freq: Every day | ORAL | Status: DC

## 2022-06-11 MED ORDER — OMEGA-3-ACID ETHYL ESTERS 1 G PO CAPS
1.0000 g | ORAL_CAPSULE | Freq: Every day | ORAL | Status: DC
  Administered 2022-06-11: 1 g via ORAL
  Filled 2022-06-11: qty 1

## 2022-06-11 MED ORDER — OXYCODONE HCL 5 MG PO TABS: 10.0000 mg | ORAL_TABLET | Freq: Every day | ORAL | Status: DC | PRN

## 2022-06-11 MED ORDER — VENLAFAXINE HCL ER 75 MG PO CP24: 150.0000 mg | ORAL_CAPSULE | Freq: Two times a day (BID) | ORAL | Status: DC

## 2022-06-11 MED ORDER — DEXAMETHASONE SODIUM PHOSPHATE 10 MG/ML IJ SOLN
INTRAMUSCULAR | Status: DC | PRN
  Administered 2022-06-11: 5 mg via INTRAVENOUS

## 2022-06-11 MED ORDER — VITAMIN D 25 MCG (1000 UNIT) PO TABS: 2000.0000 [IU] | ORAL_TABLET | Freq: Every day | ORAL | Status: DC

## 2022-06-11 MED ORDER — EZETIMIBE 10 MG PO TABS: 10.0000 mg | ORAL_TABLET | Freq: Every day | ORAL | Status: DC

## 2022-06-11 MED ORDER — HYDROMORPHONE HCL 1 MG/ML IJ SOLN
INTRAMUSCULAR | Status: AC
  Filled 2022-06-11: qty 0.5

## 2022-06-11 MED ORDER — ROSUVASTATIN CALCIUM 20 MG PO TABS: 20.0000 mg | ORAL_TABLET | Freq: Every day | ORAL | Status: DC

## 2022-06-11 MED ORDER — HYDROMORPHONE HCL 1 MG/ML IJ SOLN
0.2500 mg | INTRAMUSCULAR | Status: DC | PRN
  Administered 2022-06-11 (×2): 0.25 mg via INTRAVENOUS

## 2022-06-11 MED ORDER — MIDAZOLAM HCL 2 MG/2ML IJ SOLN
INTRAMUSCULAR | Status: AC
  Filled 2022-06-11: qty 2

## 2022-06-11 MED ORDER — PHENYLEPHRINE HCL-NACL 20-0.9 MG/250ML-% IV SOLN
INTRAVENOUS | Status: DC | PRN
  Administered 2022-06-11: 25 ug/min via INTRAVENOUS

## 2022-06-11 MED ORDER — MIDAZOLAM HCL 2 MG/2ML IJ SOLN
INTRAMUSCULAR | Status: DC | PRN
  Administered 2022-06-11: 2 mg via INTRAVENOUS

## 2022-06-11 MED ORDER — RAMIPRIL 5 MG PO CAPS
10.0000 mg | ORAL_CAPSULE | Freq: Every day | ORAL | Status: DC
  Administered 2022-06-11: 10 mg via ORAL
  Filled 2022-06-11: qty 2

## 2022-06-11 MED ORDER — ONDANSETRON HCL 4 MG/2ML IJ SOLN
INTRAMUSCULAR | Status: DC | PRN
  Administered 2022-06-11: 4 mg via INTRAVENOUS

## 2022-06-11 MED ORDER — OXYCODONE-ACETAMINOPHEN 10-325 MG PO TABS: 1.0000 | ORAL_TABLET | Freq: Every day | ORAL | Status: DC | PRN

## 2022-06-11 MED ORDER — PROPOFOL 10 MG/ML IV BOLUS
INTRAVENOUS | Status: DC | PRN
  Administered 2022-06-11: 100 mg via INTRAVENOUS

## 2022-06-11 MED ORDER — LIDOCAINE-EPINEPHRINE 1 %-1:100000 IJ SOLN
INTRAMUSCULAR | Status: AC
  Filled 2022-06-11: qty 1

## 2022-06-11 MED ORDER — ANASTROZOLE 1 MG PO TABS
1.0000 mg | ORAL_TABLET | Freq: Every day | ORAL | Status: DC
  Administered 2022-06-11: 1 mg via ORAL
  Filled 2022-06-11: qty 1

## 2022-06-11 MED ORDER — BACITRACIN ZINC 500 UNIT/GM EX OINT
TOPICAL_OINTMENT | CUTANEOUS | Status: AC
  Filled 2022-06-11: qty 28.35

## 2022-06-11 MED ORDER — VANCOMYCIN HCL IN DEXTROSE 1-5 GM/200ML-% IV SOLN
INTRAVENOUS | Status: AC
  Filled 2022-06-11: qty 200

## 2022-06-11 MED ORDER — FENTANYL CITRATE (PF) 250 MCG/5ML IJ SOLN
INTRAMUSCULAR | Status: DC | PRN
  Administered 2022-06-11: 50 ug via INTRAVENOUS

## 2022-06-11 MED ORDER — ACETAMINOPHEN 10 MG/ML IV SOLN
INTRAVENOUS | Status: AC
  Filled 2022-06-11: qty 100

## 2022-06-11 MED ORDER — HYDROMORPHONE HCL 1 MG/ML IJ SOLN
INTRAMUSCULAR | Status: AC
  Filled 2022-06-11: qty 1

## 2022-06-11 MED ORDER — PHENYLEPHRINE 80 MCG/ML (10ML) SYRINGE FOR IV PUSH (FOR BLOOD PRESSURE SUPPORT)
PREFILLED_SYRINGE | INTRAVENOUS | Status: DC | PRN
  Administered 2022-06-11: 80 ug via INTRAVENOUS

## 2022-06-11 MED ORDER — POLYETHYLENE GLYCOL 3350 17 G PO PACK: 17.0000 g | PACK | Freq: Every day | ORAL | Status: DC | PRN

## 2022-06-11 MED ORDER — PHENYLEPHRINE HCL-NACL 20-0.9 MG/250ML-% IV SOLN
INTRAVENOUS | Status: AC
  Filled 2022-06-11: qty 250

## 2022-06-11 MED ORDER — METAXALONE 800 MG PO TABS: 800.0000 mg | ORAL_TABLET | Freq: Three times a day (TID) | ORAL | Status: DC | PRN

## 2022-06-11 MED ORDER — ALPRAZOLAM 0.5 MG PO TABS: 1.0000 mg | ORAL_TABLET | Freq: Three times a day (TID) | ORAL | Status: DC | PRN

## 2022-06-11 MED ORDER — FENTANYL CITRATE (PF) 250 MCG/5ML IJ SOLN
INTRAMUSCULAR | Status: AC
  Filled 2022-06-11: qty 5

## 2022-06-11 MED ORDER — ALBUTEROL SULFATE (2.5 MG/3ML) 0.083% IN NEBU: 2.5000 mg | INHALATION_SOLUTION | RESPIRATORY_TRACT | Status: DC | PRN

## 2022-06-11 MED ORDER — MECLIZINE HCL 25 MG PO TABS: 25.0000 mg | ORAL_TABLET | Freq: Four times a day (QID) | ORAL | Status: DC | PRN

## 2022-06-11 MED ORDER — ASPIRIN 81 MG PO TBEC
81.0000 mg | DELAYED_RELEASE_TABLET | Freq: Every day | ORAL | Status: DC
  Administered 2022-06-11: 81 mg via ORAL
  Filled 2022-06-11: qty 1

## 2022-06-11 SURGICAL SUPPLY — 38 items
ADH SKN CLS APL DERMABOND .7 (GAUZE/BANDAGES/DRESSINGS) ×1
ATTRACTOMAT 16X20 MAGNETIC DRP (DRAPES) IMPLANT
BAG COUNTER SPONGE SURGICOUNT (BAG) ×1 IMPLANT
BAG SPNG CNTER NS LX DISP (BAG) ×1
CANISTER SUCT 3000ML PPV (MISCELLANEOUS) ×1 IMPLANT
CLEANER TIP ELECTROSURG 2X2 (MISCELLANEOUS) ×1 IMPLANT
CNTNR URN SCR LID CUP LEK RST (MISCELLANEOUS) IMPLANT
CONT SPEC 4OZ STRL OR WHT (MISCELLANEOUS) ×1
CORD BIPOLAR FORCEPS 12FT (ELECTRODE) ×1 IMPLANT
COVER SURGICAL LIGHT HANDLE (MISCELLANEOUS) ×1 IMPLANT
DERMABOND ADVANCED .7 DNX12 (GAUZE/BANDAGES/DRESSINGS) ×1 IMPLANT
DRAIN JACKSON RD 7FR 3/32 (WOUND CARE) IMPLANT
DRAPE HALF SHEET 40X57 (DRAPES) IMPLANT
DRAPE INCISE 23X17 STRL (DRAPES) ×1 IMPLANT
DRAPE INCISE IOBAN 23X17 STRL (DRAPES) ×1 IMPLANT
ELECT COATED BLADE 2.86 ST (ELECTRODE) ×1 IMPLANT
ELECT REM PT RETURN 9FT ADLT (ELECTROSURGICAL) ×1
ELECTRODE REM PT RTRN 9FT ADLT (ELECTROSURGICAL) ×1 IMPLANT
EVACUATOR SILICONE 100CC (DRAIN) IMPLANT
FORCEPS BIPOLAR SPETZLER 8 1.0 (NEUROSURGERY SUPPLIES) ×1 IMPLANT
GAUZE 4X4 16PLY ~~LOC~~+RFID DBL (SPONGE) ×1 IMPLANT
GOWN STRL REUS W/ TWL LRG LVL3 (GOWN DISPOSABLE) ×2 IMPLANT
GOWN STRL REUS W/TWL LRG LVL3 (GOWN DISPOSABLE) ×3
KIT BASIN OR (CUSTOM PROCEDURE TRAY) ×1 IMPLANT
KIT TURNOVER KIT B (KITS) ×1 IMPLANT
NS IRRIG 1000ML POUR BTL (IV SOLUTION) ×1 IMPLANT
PAD ARMBOARD 7.5X6 YLW CONV (MISCELLANEOUS) ×2 IMPLANT
PENCIL FOOT CONTROL (ELECTRODE) ×1 IMPLANT
SHEARS HARMONIC 9CM CVD (BLADE) ×1 IMPLANT
STAPLER VISISTAT 35W (STAPLE) ×1 IMPLANT
SUT CHROMIC 4 0 PS 2 18 (SUTURE) ×1 IMPLANT
SUT ETHILON 2 0 FS 18 (SUTURE) IMPLANT
SUT SILK 2 0 SH CR/8 (SUTURE) ×1 IMPLANT
SUT SILK 4 0 PS 2 (SUTURE) IMPLANT
SUT SILK 4 0 REEL (SUTURE) ×1 IMPLANT
SYR CONTROL 10ML LL (SYRINGE) IMPLANT
TOWEL GREEN STERILE FF (TOWEL DISPOSABLE) ×1 IMPLANT
TRAY ENT MC OR (CUSTOM PROCEDURE TRAY) ×1 IMPLANT

## 2022-06-11 NOTE — Discharge Instructions (Signed)
Empty drain, measure amount and record, recharge drain 3 times daily.  Keep face clean and dry.  Do not put any creams, oils or ointments on the surgical site.

## 2022-06-11 NOTE — Care Management (Signed)
  Transition of Care [facility]) Screening Note   Patient Details  Name: Connie Bennett Date of Birth: 03-Aug-1950   Transition of Care [facility]) CM/SW Contact:    Carles Collet, RN Phone Number: 06/11/2022, 2:01 PM    Transition of Care Department [facility]) has reviewed patient and no TOC needs have been identified at this time. We will continue to monitor patient advancement through interdisciplinary progression rounds. If new patient transition needs arise, please place a TOC consult.    Placed in observation after PAROTIDECTOMY 3/13. Anticipate DC to home when medically stable without any TOC needs.

## 2022-06-11 NOTE — Progress Notes (Signed)
0.5 mg IV dilaudid wasted in stericycle with Ludger Nutting. After patient discharged.

## 2022-06-11 NOTE — Anesthesia Procedure Notes (Signed)
Procedure Name: Intubation Date/Time: 06/11/2022 11:11 AM  Performed by: Elvin So, CRNAPre-anesthesia Checklist: Patient identified, Emergency Drugs available, Suction available and Patient being monitored Patient Re-evaluated:Patient Re-evaluated prior to induction Oxygen Delivery Method: Circle System Utilized Preoxygenation: Pre-oxygenation with 100% oxygen Induction Type: IV induction Ventilation: Mask ventilation without difficulty Laryngoscope Size: Mac and 3 Grade View: Grade I Tube type: Oral Tube size: 7.0 mm Number of attempts: 1 Airway Equipment and Method: Stylet and Oral airway Placement Confirmation: ETT inserted through vocal cords under direct vision, positive ETCO2 and breath sounds checked- equal and bilateral Secured at: 19 cm Tube secured with: Tape Dental Injury: Teeth and Oropharynx as per pre-operative assessment

## 2022-06-11 NOTE — Transfer of Care (Signed)
Immediate Anesthesia Transfer of Care Note  Patient: Connie Bennett  Procedure(s) Performed: PAROTIDECTOMY WITH FACIAL NERVE DISSECTION AND MONITORING (Left: Neck)  Patient Location: PACU  Anesthesia Type:General  Level of Consciousness: awake and patient cooperative  Airway & Oxygen Therapy: Patient Spontanous Breathing and Patient connected to face mask oxygen  Post-op Assessment: Report given to RN, Post -op Vital signs reviewed and stable, and Patient moving all extremities  Post vital signs: Reviewed and stable  Last Vitals:  Vitals Value Taken Time  BP 183/87 06/11/22 1250  Temp    Pulse 79 06/11/22 1252  Resp 10 06/11/22 1252  SpO2 98 % 06/11/22 1252  Vitals shown include unvalidated device data.  Last Pain:  Vitals:   06/11/22 0907  TempSrc:   PainSc: 6       Patients Stated Pain Goal: 3 ([id number])  Complications: No notable events documented.

## 2022-06-11 NOTE — Anesthesia Preprocedure Evaluation (Addendum)
Anesthesia Evaluation  Patient identified by MRN, date of birth, ID band Patient awake    Reviewed: Allergy & Precautions, H&P , NPO status , Patient's Chart, lab work & pertinent test results  Airway Mallampati: II  TM Distance: >3 FB Neck ROM: Full    Dental no notable dental hx. (+) Edentulous Upper, Edentulous Lower, Dental Advisory Given   Pulmonary sleep apnea , Current Smoker and Patient abstained from smoking.   Pulmonary exam normal breath sounds clear to auscultation       Cardiovascular hypertension, Pt. on medications + Peripheral Vascular Disease   Rhythm:Regular Rate:Normal     Neuro/Psych  Headaches  Anxiety Depression       GI/Hepatic negative GI ROS, Neg liver ROS,,,  Endo/Other  negative endocrine ROS    Renal/GU Renal InsufficiencyRenal disease  negative genitourinary   Musculoskeletal  (+) Arthritis , Osteoarthritis,  Fibromyalgia -  Abdominal   Peds  Hematology  (+) Blood dyscrasia, anemia   Anesthesia Other Findings   Reproductive/Obstetrics negative OB ROS                             Anesthesia Physical Anesthesia Plan  ASA: 3  Anesthesia Plan: General   Post-op Pain Management: Tylenol PO (pre-op)*   Induction: Intravenous  PONV Risk Score and Plan: 3 and Ondansetron, Dexamethasone and Midazolam  Airway Management Planned: Oral ETT  Additional Equipment:   Intra-op Plan:   Post-operative Plan: Extubation in OR  Informed Consent: I have reviewed the patients History and Physical, chart, labs and discussed the procedure including the risks, benefits and alternatives for the proposed anesthesia with the patient or authorized representative who has indicated his/her understanding and acceptance.     Dental advisory given  Plan Discussed with: CRNA and Surgeon  Anesthesia Plan Comments:        Anesthesia Quick Evaluation

## 2022-06-11 NOTE — Interval H&P Note (Signed)
History and Physical Interval Note:  06/11/2022 10:29 AM  Connie Bennett  has presented today for surgery, with the diagnosis of Parotid mass.  The various methods of treatment have been discussed with the patient and family. After consideration of risks, benefits and other options for treatment, the patient has consented to  Procedure(s): PAROTIDECTOMY WITH FACIAL NERVE DISSECTION AND MONITORING (Left) as a surgical intervention.  The patient's history has been reviewed, patient examined, no change in status, stable for surgery.  I have reviewed the patient's chart and labs.  Questions were answered to the patient's satisfaction.     Izora Gala

## 2022-06-11 NOTE — Anesthesia Postprocedure Evaluation (Signed)
Anesthesia Post Note  Patient: Connie Bennett  Procedure(s) Performed: PAROTIDECTOMY WITH FACIAL NERVE DISSECTION AND MONITORING (Left: Neck)     Patient location during evaluation: PACU Anesthesia Type: General Level of consciousness: awake and alert Pain management: pain level controlled Vital Signs Assessment: post-procedure vital signs reviewed and stable Respiratory status: spontaneous breathing, nonlabored ventilation and respiratory function stable Cardiovascular status: blood pressure returned to baseline and stable Postop Assessment: no apparent nausea or vomiting Anesthetic complications: no  No notable events documented.  Last Vitals:  Vitals:   06/11/22 1320 06/11/22 1352  BP: (!) 133/98 (!) 157/76  Pulse: 72 74  Resp: 10 13  Temp: 37.1 C 36.8 C  SpO2: 96% 100%    Last Pain:  Vitals:   06/11/22 1352  TempSrc: Oral  PainSc:                  Devonta Blanford,W. EDMOND

## 2022-06-11 NOTE — Op Note (Signed)
OPERATIVE REPORT  DATE OF SURGERY: 06/11/2022  PATIENT:  Connie Bennett,  72 y.o. female  PRE-OPERATIVE DIAGNOSIS:  Parotid mass  POST-OPERATIVE DIAGNOSIS:  Parotid mass  PROCEDURE:  Procedure(s): PAROTIDECTOMY WITH FACIAL NERVE DISSECTION AND MONITORING  SURGEON:  Beckie Salts, MD  ASSISTANTS: RNFA  ANESTHESIA:   General   EBL: 20 ml  DRAINS: 7 French round JP  LOCAL MEDICATIONS USED:  None  SPECIMEN: Left parotid  COUNTS:  Correct  PROCEDURE DETAILS: The patient was taken to the operating room and placed on the operating table in the supine position. Following induction of general endotracheal anesthesia, the left side of the face was prepped and draped in a standard fashion. A preauricular incision was outlined with a marking pen with extension down around the mastoid process and 2 fingerbreadths below the angle of the mandible.  Electrocautery was used to incise the skin and subcutaneous tissue. The lateral branch of the greater auricular nerve was preserved. The parotid gland was dissected forward off of the upper sternocleidomastoid muscle and the digastric muscle was exposed posteriorly. The gland was also dissected off of the external auditory canal. Careful dissection superior to the digastric muscle and medial to the tympanomastoid suture line revealed the main trunk of the facial nerve. Using a McCabe dissector the main trunk was dissected out towards the pes anserinus. The upper and lower divisions were then dissected identifying all branches.  The harmonic scalpel was used to divide the parotid tissue. Bipolar cautery was used as needed for completion of hemostasis. The tumor was identified in the inferior part of the gland. The glandular tissue with the accompanying tumor was dissected free and removed, sent for pathologic evaluation. The wound was irrigated with saline and hemostasis was completed as needed.  The drain was exited through a separate stab incision  and secured in place with nylon suture. A running subcuticular chromic closure was accomplished and Dermabond was used on the skin. The drain was charged. She was awakened, extubated and transferred to recovery in stable condition.    PATIENT DISPOSITION:  To PACU, stable

## 2022-06-12 ENCOUNTER — Encounter (HOSPITAL_COMMUNITY): Payer: Self-pay | Admitting: Otolaryngology

## 2022-06-12 LAB — SURGICAL PATHOLOGY

## 2022-06-13 DIAGNOSIS — D119 Benign neoplasm of major salivary gland, unspecified: Secondary | ICD-10-CM | POA: Diagnosis not present

## 2022-06-13 NOTE — Discharge Summary (Signed)
Physician Discharge Summary  Patient ID: Connie Bennett MRN: [id number] DOB/AGE: Feb 13, 1951 72 y.o.  Admit date: 06/11/2022 Discharge date: 06/13/2022  Admission Diagnoses:parotid mass  Discharge Diagnoses:  Principal Problem:   Parotid mass   Discharged Condition: good  Hospital Course: no complications  Consults: none  Significant Diagnostic Studies: none  Treatments: surgery: parotidectomy  Discharge Exam: Blood pressure (!) 157/76, pulse 74, temperature 98.3 F (36.8 C), temperature source Oral, resp. rate 13, height 5\' 2"  (1.575 m), weight 51.3 kg, SpO2 100 %. PHYSICAL EXAM: Awake and alert. Incision clean dry and intact. Facial nerve function normal and symmetric.  Disposition: Discharge disposition: 01-Home or Self Care       Discharge Instructions     Diet - low sodium heart healthy   Complete by: As directed    Increase activity slowly   Complete by: As directed       Allergies as of 06/11/2022       Reactions   Carbocaine [mepivacaine Hcl] Hives, Swelling   Penicillins Nausea And Vomiting, Swelling, Rash, Other (See Comments)   Has patient had a PCN reaction causing immediate rash, facial/tongue/throat swelling, SOB or lightheadedness with hypotension: Yes Has patient had a PCN reaction causing severe rash involving mucus membranes or skin necrosis: No Has patient had a PCN reaction that required hospitalization No Has patient had a PCN reaction occurring within the last 10 years: Yes If all of the above answers are "NO", then may proceed with Cephalosporin use.   Sulfa Antibiotics Nausea And Vomiting, Swelling, Rash, Other (See Comments)   Headache   Latex Rash, Other (See Comments)   Reaction to gloves and bandaids (paper tape is ok)        Medication List     TAKE these medications    ALPRAZolam 1 MG tablet Commonly known as: XANAX Take 1 mg by mouth 3 (three) times daily as needed for anxiety or sleep (nerves).   amLODipine 10 MG  tablet Commonly known as: NORVASC Take 1 tablet (10 mg total) by mouth daily.   anastrozole 1 MG tablet Commonly known as: ARIMIDEX Take 1 tablet (1 mg total) by mouth daily.   aspirin EC 81 MG tablet Take 81 mg by mouth daily. Swallow whole.   B-complex with vitamin C tablet Take 1 tablet by mouth daily.   brexpiprazole 1 MG Tabs tablet Commonly known as: REXULTI Take 1 mg by mouth 2 (two) times daily with breakfast and lunch.   calcium carbonate 600 MG Tabs tablet Commonly known as: OS-CAL Take 600 mg by mouth daily with lunch.   ezetimibe 10 MG tablet Commonly known as: ZETIA Take 10 mg by mouth at bedtime.   ferrous sulfate 325 (65 FE) MG tablet Take 1 tablet (325 mg total) by mouth daily with breakfast.   Fish Oil 1000 MG Caps Take 1,000 mg by mouth daily with lunch.   Flax Seed Oil 1000 MG Caps Take 1,000 mg by mouth daily with lunch.   gabapentin 600 MG tablet Commonly known as: NEURONTIN Take 1 tablet (600 mg total) by mouth at bedtime for 14 days. Take one tablet (600 mg) by mouth every morning and two tablets (1200 mg) at night What changed:  how much to take when to take this additional instructions   ipratropium 0.06 % nasal spray Commonly known as: ATROVENT Place 1 spray into both nostrils 3 (three) times daily as needed (allergies).   lidocaine 5 % Commonly known as: LIDODERM Place 1 patch onto  the skin at bedtime as needed (pain).   meclizine 25 MG tablet Commonly known as: ANTIVERT Take 25 mg by mouth 4 (four) times daily as needed for dizziness.   metaxalone 800 MG tablet Commonly known as: SKELAXIN Take 800 mg by mouth 3 (three) times daily as needed for muscle spasms.   naloxegol oxalate 25 MG Tabs tablet Commonly known as: MOVANTIK Take 25 mg by mouth daily as needed (for constipation).   naloxone 4 MG/0.1ML Liqd nasal spray kit Commonly known as: NARCAN Place 1 spray into the nose once as needed (for opioid overdose).    oxyCODONE-acetaminophen 10-325 MG tablet Commonly known as: PERCOCET Take 1 tablet by mouth 5 (five) times daily as needed for pain.   polyethylene glycol 17 g packet Commonly known as: MIRALAX / GLYCOLAX Take 17 g by mouth daily as needed for mild constipation.   ProAir HFA 108 (90 Base) MCG/ACT inhaler Generic drug: albuterol Inhale 2 puffs into the lungs every 4 (four) hours as needed for wheezing or shortness of breath.   PROBIOTIC ACIDOPHILUS PO Take 1 tablet by mouth daily with lunch.   ramipril 10 MG capsule Commonly known as: ALTACE Take 10 mg by mouth daily.   rosuvastatin 20 MG tablet Commonly known as: CRESTOR Take 20 mg by mouth at bedtime.   venlafaxine XR 150 MG 24 hr capsule Commonly known as: EFFEXOR-XR Take 150 mg by mouth 2 (two) times daily with breakfast and lunch.   vitamin C 1000 MG tablet Take 1,000 mg by mouth daily with lunch.   Vitamin D 50 MCG (2000 UT) tablet Take 2,000 Units by mouth daily with lunch.        Follow-up Information     Izora Gala, MD Follow up on 06/13/2022.   Specialty: Otolaryngology Why: Come at 1:00 pm for drain removal Contact information: [address] [place] [address] [phone]                 Signed: Izora Gala 06/13/2022, 9:39 AM

## 2022-07-02 DIAGNOSIS — M5441 Lumbago with sciatica, right side: Secondary | ICD-10-CM | POA: Diagnosis not present

## 2022-07-02 DIAGNOSIS — M62838 Other muscle spasm: Secondary | ICD-10-CM | POA: Diagnosis not present

## 2022-07-02 DIAGNOSIS — K5903 Drug induced constipation: Secondary | ICD-10-CM | POA: Diagnosis not present

## 2022-07-02 DIAGNOSIS — M5442 Lumbago with sciatica, left side: Secondary | ICD-10-CM | POA: Diagnosis not present

## 2022-07-02 DIAGNOSIS — M533 Sacrococcygeal disorders, not elsewhere classified: Secondary | ICD-10-CM | POA: Diagnosis not present

## 2022-07-02 DIAGNOSIS — M545 Low back pain, unspecified: Secondary | ICD-10-CM | POA: Diagnosis not present

## 2022-07-02 DIAGNOSIS — G8929 Other chronic pain: Secondary | ICD-10-CM | POA: Diagnosis not present

## 2022-07-02 DIAGNOSIS — F1190 Opioid use, unspecified, uncomplicated: Secondary | ICD-10-CM | POA: Diagnosis not present

## 2022-07-02 DIAGNOSIS — Z9689 Presence of other specified functional implants: Secondary | ICD-10-CM | POA: Diagnosis not present

## 2022-07-14 ENCOUNTER — Other Ambulatory Visit: Payer: Self-pay

## 2022-07-14 ENCOUNTER — Encounter: Payer: Self-pay | Admitting: *Deleted

## 2022-07-14 ENCOUNTER — Emergency Department: Payer: PPO

## 2022-07-14 DIAGNOSIS — D72829 Elevated white blood cell count, unspecified: Secondary | ICD-10-CM | POA: Insufficient documentation

## 2022-07-14 DIAGNOSIS — Z91040 Latex allergy status: Secondary | ICD-10-CM | POA: Insufficient documentation

## 2022-07-14 DIAGNOSIS — R1110 Vomiting, unspecified: Secondary | ICD-10-CM | POA: Diagnosis not present

## 2022-07-14 DIAGNOSIS — I10 Essential (primary) hypertension: Secondary | ICD-10-CM | POA: Diagnosis not present

## 2022-07-14 DIAGNOSIS — N390 Urinary tract infection, site not specified: Secondary | ICD-10-CM | POA: Diagnosis not present

## 2022-07-14 DIAGNOSIS — E860 Dehydration: Secondary | ICD-10-CM | POA: Diagnosis not present

## 2022-07-14 DIAGNOSIS — Z7982 Long term (current) use of aspirin: Secondary | ICD-10-CM | POA: Diagnosis not present

## 2022-07-14 DIAGNOSIS — Z853 Personal history of malignant neoplasm of breast: Secondary | ICD-10-CM | POA: Insufficient documentation

## 2022-07-14 DIAGNOSIS — R197 Diarrhea, unspecified: Secondary | ICD-10-CM | POA: Diagnosis not present

## 2022-07-14 DIAGNOSIS — R531 Weakness: Secondary | ICD-10-CM | POA: Diagnosis not present

## 2022-07-14 DIAGNOSIS — Z79899 Other long term (current) drug therapy: Secondary | ICD-10-CM | POA: Insufficient documentation

## 2022-07-14 LAB — BASIC METABOLIC PANEL
Anion gap: 8 (ref 5–15)
BUN: 28 mg/dL — ABNORMAL HIGH (ref 8–23)
CO2: 27 mmol/L (ref 22–32)
Calcium: 9.4 mg/dL (ref 8.9–10.3)
Chloride: 102 mmol/L (ref 98–111)
Creatinine, Ser: 1.63 mg/dL — ABNORMAL HIGH (ref 0.44–1.00)
GFR, Estimated: 33 mL/min — ABNORMAL LOW (ref >60)
Glucose, Bld: 97 mg/dL (ref 70–99)
Potassium: 3.9 mmol/L (ref 3.5–5.1)
Sodium: 137 mmol/L (ref 135–145)

## 2022-07-14 LAB — URINALYSIS, ROUTINE W REFLEX MICROSCOPIC
Bacteria, UA: NONE SEEN
Bilirubin Urine: NEGATIVE
Glucose, UA: NEGATIVE mg/dL
Hgb urine dipstick: NEGATIVE
Ketones, ur: NEGATIVE mg/dL
Nitrite: NEGATIVE
Protein, ur: 30 mg/dL — AB
Specific Gravity, Urine: 1.014 (ref 1.005–1.030)
pH: 5 (ref 5.0–8.0)

## 2022-07-14 LAB — CBC
HCT: 32.2 % — ABNORMAL LOW (ref 36.0–46.0)
Hemoglobin: 10.2 g/dL — ABNORMAL LOW (ref 12.0–15.0)
MCH: 31.3 pg (ref 26.0–34.0)
MCHC: 31.7 g/dL (ref 30.0–36.0)
MCV: 98.8 fL (ref 80.0–100.0)
Platelets: 204 10*3/uL (ref 150–400)
RBC: 3.26 MIL/uL — ABNORMAL LOW (ref 3.87–5.11)
RDW: 13.4 % (ref 11.5–15.5)
WBC: 12.6 10*3/uL — ABNORMAL HIGH (ref 4.0–10.5)
nRBC: 0 % (ref 0.0–0.2)

## 2022-07-14 LAB — TROPONIN I (HIGH SENSITIVITY): Troponin I (High Sensitivity): 7 ng/L (ref <18)

## 2022-07-14 NOTE — ED Triage Notes (Signed)
Pt reports feeling weak for 3 days.  Recent vomiting and diarrhea 2 days ago.  Pt denies chest pain or sob.   Pt alert  speech clear.

## 2022-07-15 ENCOUNTER — Emergency Department
Admission: EM | Admit: 2022-07-15 | Discharge: 2022-07-15 | Disposition: A | Discharge status: Home / Self Care | Payer: PPO | Attending: Emergency Medicine | Admitting: Emergency Medicine

## 2022-07-15 DIAGNOSIS — R531 Weakness: Secondary | ICD-10-CM

## 2022-07-15 DIAGNOSIS — N390 Urinary tract infection, site not specified: Secondary | ICD-10-CM

## 2022-07-15 DIAGNOSIS — E860 Dehydration: Secondary | ICD-10-CM

## 2022-07-15 LAB — TSH: TSH: 3.19 u[IU]/mL (ref 0.350–4.500)

## 2022-07-15 LAB — T4, FREE: Free T4: 0.59 ng/dL — ABNORMAL LOW (ref 0.61–1.12)

## 2022-07-15 MED ORDER — SODIUM CHLORIDE 0.9 % IV BOLUS
1000.0000 mL | Freq: Once | INTRAVENOUS | Status: AC
  Administered 2022-07-15: 1000 mL via INTRAVENOUS

## 2022-07-15 MED ORDER — FOSFOMYCIN TROMETHAMINE 3 G PO PACK
3.0000 g | PACK | Freq: Once | ORAL | Status: AC
  Administered 2022-07-15: 3 g via ORAL
  Filled 2022-07-15: qty 3

## 2022-07-15 NOTE — ED Notes (Signed)
Pt Dc to home. Dc instructions reviewed with all questions answered. Pt voices understanding. IV removed, cath intact, pressure dressing applied. No bleeding noted at site. Pt ambulatory out of dept with steady gait 

## 2022-07-15 NOTE — Discharge Instructions (Signed)
Drink plenty of fluids daily.  Return to the ER for worsening symptoms, persistent vomiting, difficulty breathing or other concerns. °

## 2022-07-15 NOTE — ED Provider Notes (Signed)
[facility] Provider Note    Event Date/Time   First MD Initiated Contact with Patient 07/15/22 0015     (approximate)   History   Weakness   HPI  Connie Bennett is a 72 y.o. female who presents to the ED from home with a chief complaint of generalized weakness x 3 days.  Recently had 2 episodes of vomiting with diarrhea 2 days ago.  Took a home COVID test today which was negative.  Denies associated fever/chills, cough, chest pain, shortness of breath, abdominal pain, dysuria, dizziness.  History of parathyroid surgery, fibromyalgia, chronic fatigue syndrome.     Past Medical History   Past Medical History:  Diagnosis Date   Anemia    Anxiety    takes Xanax daily as needed   Arthritis    Bilateral lower extremity edema    takes Furosemide daily as needed   Breast cancer oncologist-  dr Malachy Mood--  right upper-outer quadrant    dx May 2016--- Stage 0  (Tis,N0,M0)  DCIS  Right breast---  09-13-2014  s/p  radioactive seed/ partial mastectomy / sln bx   Chronic fatigue syndrome    Chronic low back pain    Complication of anesthesia    spinal cord stimulator- to be off for surgery   Depression    takes Effexor daily   Family history of adverse reaction to anesthesia    mom hard to wake up   Fibromyalgia    Headache(784.0)    couple of times a week   Heart murmur    History of bronchitis > 63yrs ago   History of colon polyps    benign   History of drug overdose    oxycontin  and oxycodone  Sept 2015--  now has narcan injection prescription   History of DVT of lower extremity    2008--  BILATERAL   HTN (hypertension)    takes Amlodipine and Ramipril daily   Hyperlipidemia    takes Zetia and Crestor  daily   Joint pain    Mitral valve prolapse    MILD /   PER PT ASYMPTOMATIC   Muscle spasm    takes Zanaflex daily as needed   Osteoporosis    Peripheral neuropathy    takes Neurontin daily   Peripheral vascular disease    hx dvt's    RSD (reflex sympathetic dystrophy)    left wrist and forearm from a fx   RSD (reflex sympathetic dystrophy)    Sjogren's disease    Vertigo    takes Meclizine daily      Active Problem List   Patient Active Problem List   Diagnosis Date Noted   Parotid mass 06/11/2022   Osteoporosis 05/27/2022   parotid gland mass 04/23/2022   PAD (peripheral artery disease) 01/28/2022   Chronic venous insufficiency 01/28/2022   C. difficile diarrhea 12/31/2021   Medication monitoring encounter 12/31/2021   Recurrent cellulitis of lower extremity 12/31/2021   Smoking 12/31/2021   Abdominal aortic aneurysm (AAA) 3.0 cm to 5.0 cm in diameter in female 12/04/2021   Aortic atherosclerosis 12/04/2021   Recurrent Clostridioides difficile diarrhea 12/04/2021   Asymptomatic bacteriuria 12/04/2021   CAD (coronary artery disease) 12/04/2021   Tobacco use 12/04/2021   AKI (acute kidney injury) 12/02/2021   Cellulitis of left lower extremity 07/20/2021   Mixed hyperlipidemia 07/20/2021   Major depressive disorder 07/20/2021   Acute renal failure superimposed on stage 3a chronic kidney disease 07/20/2021   Hypokalemia  07/20/2021   Malnutrition of moderate degree 07/08/2021   SIRS (systemic inflammatory response syndrome) 07/07/2021   Osteopenia 11/30/2015   Anemia of chronic disease 08/31/2015   Breast cancer of upper-inner quadrant of left female breast 06/29/2015   Status post mastectomy 01/25/2015   Preoperative clearance 12/15/2014   Right bundle branch block 12/15/2014   Essential hypertension 12/15/2014   Mitral valve prolapse 12/15/2014   Ductal carcinoma in situ (DCIS) of right breast 09/04/2014   Complex sleep apnea syndrome 11/14/2010     Past Surgical History   Past Surgical History:  Procedure Laterality Date   BACK SURGERY     BREAST RECONSTRUCTION WITH PLACEMENT OF TISSUE EXPANDER AND FLEX HD (ACELLULAR HYDRATED DERMIS) Right 01/25/2015   Procedure: RIGHT BREAST RECONSTRUCTION  WITH PLACEMENT OF IMPLANT AND ACELLULAR DERMAL MATRIX ;  Surgeon: Etter Sjogren, MD;  Location: [facility];  Service: Plastics;  Laterality: Right;   BREAST RECONSTRUCTION WITH PLACEMENT OF TISSUE EXPANDER AND FLEX HD (ACELLULAR HYDRATED DERMIS) Left 07/05/2015   Procedure: LEFT BREAST RECONSTRUCTION WITH PLACEMENT OF TISSUE EXPANDER AND  ACELLULAR DERMAL MATRIX ;  Surgeon: Etter Sjogren, MD;  Location: [facility] OR;  Service: Plastics;  Laterality: Left;   COLONOSCOPY     ESOPHAGOGASTRODUODENOSCOPY     eye lid surgery Bilateral    FOOT SURGERY Bilateral 1996   MORTON'S NEUROMA   INNER EAR SURGERY Right    LUMBAR DISC SURGERY     MASTECTOMY Right 01/25/2015   nipple sparing    NASAL SINUS SURGERY     NIPPLE SPARING MASTECTOMY/SENTINAL LYMPH NODE BIOPSY/RECONSTRUCTION/PLACEMENT OF TISSUE EXPANDER Left 07/05/2015   Procedure: LEFT NIPPLE SPARING MASTECTOMY WITH SENTINAL LYMPH NODE BIOPSY ;  Surgeon: Almond Lint, MD;  Location: [facility];  Service: General;  Laterality: Left;   OPEN REDUCTION INTERNAL FIXATION (ORIF) DISTAL RADIAL FRACTURE Right 08/09/2018   Procedure: OPEN REDUCTION INTERNAL FIXATION (ORIF) DISTAL RADIAL FRACTURE;  Surgeon: Mack Hook, MD;  Location: [facility];  Service: Orthopedics;  Laterality: Right;   ORIF LEFT WRIST FX'S/  LEFT CARPAL TUNNEL RELEASE  1999   PAROTIDECTOMY Left 06/11/2022   Procedure: PAROTIDECTOMY WITH FACIAL NERVE DISSECTION AND MONITORING;  Surgeon: Serena Colonel, MD;  Location: [facility];  Service: ENT;  Laterality: Left;   POSTERIOR LUMBAR FUSION  01-30-2009   L4--5 Laminectmy w/ decompression/  bilateral L4--5 microdiskectomy and fusion   RADIOACTIVE SEED GUIDED PARTIAL MASTECTOMY WITH AXILLARY SENTINEL LYMPH NODE BIOPSY Right 09/13/2014   Procedure: RADIOACTIVE SEED GUIDED PARTIAL MASTECTOMY WITH AXILLARY SENTINEL LYMPH NODE BIOPSY;  Surgeon: Almond Lint, MD;  Location: [facility];  Service: General;  Laterality: Right;   RE-EXCISION OF BREAST  LUMPECTOMY Right 09/29/2014   Procedure: RE-EXCISION OF BREAST LUMPECTOMY;  Surgeon: Almond Lint, MD;  Location: [facility];  Service: General;  Laterality: Right;   RIGHT CARPAL TUNNEL RELEASE/  TRIGGER RELEASE RIGHT MIDDLE FINGER  06-18-2006   RIGHT INFERIOR PARATHYROIDECTOMY  04-07-2006   SIMPLE MASTECTOMY WITH AXILLARY SENTINEL NODE BIOPSY Right 01/25/2015   Procedure: RIGHT NIPPLE SPARING MASTECTOMY;  Surgeon: Almond Lint, MD;  Location: [facility];  Service: General;  Laterality: Right;   SPINAL CORD STIMULATOR IMPLANT  2013   TONSILLECTOMY  as child     Home Medications   Prior to Admission medications   Medication Sig Start Date End Date Taking? Authorizing Provider  ALPRAZolam Prudy Feeler) 1 MG tablet Take 1 mg by mouth 3 (three) times daily as needed for anxiety or sleep (nerves). 07/15/21  [provider]  amLODipine (NORVASC) 10 MG tablet Take 1 tablet (10 mg total) by mouth daily. 12/08/21   Burnadette Pop, MD  anastrozole (ARIMIDEX) 1 MG tablet Take 1 tablet (1 mg total) by mouth daily. 06/09/22   Malachy Mood, MD  Ascorbic Acid (VITAMIN C) 1000 MG tablet Take 1,000 mg by mouth daily with lunch.    [provider]  aspirin EC 81 MG tablet Take 81 mg by mouth daily. Swallow whole.    [provider]  B Complex-C (B-COMPLEX WITH VITAMIN C) tablet Take 1 tablet by mouth daily. 07/09/21   Rolly Salter, MD  brexpiprazole (REXULTI) 1 MG TABS tablet Take 1 mg by mouth 2 (two) times daily with breakfast and lunch.    [provider]  calcium carbonate (OS-CAL) 600 MG TABS tablet Take 600 mg by mouth daily with lunch.    [provider]  Cholecalciferol (VITAMIN D) 50 MCG (2000 UT) tablet Take 2,000 Units by mouth daily with lunch.    [provider]  ezetimibe (ZETIA) 10 MG tablet Take 10 mg by mouth at bedtime.     [provider]  ferrous sulfate 325 (65 FE) MG tablet Take 1 tablet (325 mg total) by mouth daily with  breakfast. 07/10/21   Rolly Salter, MD  Flaxseed, Linseed, (FLAX SEED OIL) 1000 MG CAPS Take 1,000 mg by mouth daily with lunch.    [provider]  gabapentin (NEURONTIN) 600 MG tablet Take 1 tablet (600 mg total) by mouth at bedtime for 14 days. Take one tablet (600 mg) by mouth every morning and two tablets (1200 mg) at night Patient taking differently: Take 600-1,200 mg by mouth See admin instructions. Take one tablet (600 mg) by mouth every morning, one tablet (600 mg) at lunch and two tablets (1200 mg) at night 07/28/21 12/05/22  Darlin Drop, DO  ipratropium (ATROVENT) 0.06 % nasal spray Place 1 spray into both nostrils 3 (three) times daily as needed (allergies). 04/11/22   [provider]  Lactobacillus (PROBIOTIC ACIDOPHILUS PO) Take 1 tablet by mouth daily with lunch.    [provider]  lidocaine (LIDODERM) 5 % Place 1 patch onto the skin at bedtime as needed (pain). 05/02/21   [provider]  meclizine (ANTIVERT) 25 MG tablet Take 25 mg by mouth 4 (four) times daily as needed for dizziness. 05/08/21   [provider]  metaxalone (SKELAXIN) 800 MG tablet Take 800 mg by mouth 3 (three) times daily as needed for muscle spasms. 03/20/22   [provider]  naloxegol oxalate (MOVANTIK) 25 MG TABS tablet Take 25 mg by mouth daily as needed (for constipation). 08/01/21   [provider]  naloxone [facility]) nasal spray 4 mg/0.1 mL Place 1 spray into the nose once as needed (for opioid overdose). 10/23/21   [provider]  Omega-3 Fatty Acids (FISH OIL) 1000 MG CAPS Take 1,000 mg by mouth daily with lunch.    [provider]  oxyCODONE-acetaminophen (PERCOCET) 10-325 MG tablet Take 1 tablet by mouth 5 (five) times daily as needed for pain.    [provider]  polyethylene glycol (MIRALAX / GLYCOLAX) 17 g packet Take 17 g by mouth daily as needed for mild constipation. 07/28/21   Darlin Drop, DO  PROAIR HFA 108 [phone]  Base) MCG/ACT inhaler Inhale 2 puffs into the lungs every 4 (four) hours as needed for wheezing or shortness of breath. 08/06/16   [provider]  ramipril (ALTACE) 10 MG capsule Take 10 mg by mouth daily. 07/11/15   [provider]  rosuvastatin (CRESTOR) 20 MG tablet Take 20 mg by mouth at bedtime.    [provider]  venlafaxine (EFFEXOR-XR) 150 MG 24 hr capsule Take 150 mg by mouth 2 (two) times daily with breakfast and lunch.    [provider]     Allergies  Carbocaine [mepivacaine hcl], Penicillins, Sulfa antibiotics, and Latex   Family History   Family History  Problem Relation Age of Onset   Heart murmur Mother    Cancer Mother 51       carcinoid tumor    Hypertension Mother    Heart murmur Sister        x2   Cancer Maternal Aunt        lung cancer   Cancer Paternal Aunt        breast cancer    Cancer Maternal Aunt        gastric cancer    Cancer Cousin        lung cancer   Cancer Cousin        bone cancer    Heart attack Maternal Uncle    Heart attack Paternal Uncle    Hypertension Son    Stroke Neg Hx      Physical Exam  Triage Vital Signs: ED Triage Vitals  Enc Vitals Group     BP 07/14/22 1946 124/72     Pulse Rate 07/14/22 1946 88     Resp 07/14/22 1946 20     Temp 07/14/22 1946 98.8 F (37.1 C)     Temp Source 07/14/22 1946 Oral     SpO2 07/14/22 1946 97 %     Weight 07/14/22 1942 116 lb (52.6 kg)     Height 07/14/22 1942 5\' 2"  (1.575 m)     Head Circumference --      Peak Flow --      Pain Score 07/14/22 1942 0     Pain Loc --      Pain Edu? --      Excl. in GC? --     Updated Vital Signs: BP 124/72 (BP Location: Left Arm)   Pulse 88   Temp 98.8 F (37.1 C) (Oral)   Resp 20   Ht 5\' 2"  (1.575 m)   Wt 52.6 kg   SpO2 97%   BMI 21.22 kg/m    General: Awake, no distress.  Mildly dry mucous membranes. CV:  RRR.  Good peripheral perfusion.  Resp:  Normal effort.  CTAB. Abd:  Nontender to light or  deep palpation.  No distention. No truncal vesicles. Other:  No thyromegaly.   ED Results / Procedures / Treatments  Labs (all labs ordered are listed, but only abnormal results are displayed) Labs Reviewed  BASIC METABOLIC PANEL - Abnormal; Notable for the following components:      Result Value   BUN 28 (*)    Creatinine, Ser 1.63 (*)    GFR, Estimated 33 (*)    All other components within normal limits  CBC - Abnormal; Notable for the following components:   WBC 12.6 (*)    RBC 3.26 (*)    Hemoglobin 10.2 (*)    HCT 32.2 (*)    All other components within normal limits  URINALYSIS, ROUTINE W REFLEX MICROSCOPIC - Abnormal; Notable for the following components:   Color, Urine YELLOW (*)    APPearance HAZY (*)  Protein, ur 30 (*)    Leukocytes,Ua SMALL (*)    All other components within normal limits  TSH  T4, FREE  TROPONIN I (HIGH SENSITIVITY)     EKG  ED ECG REPORT I, Aimee Heldman J, the attending physician, personally viewed and interpreted this ECG.   Date: 07/15/2022  EKG Time: 1949  Rate: 83  Rhythm: normal sinus rhythm  Axis: Normal  Intervals:right bundle branch block  ST&T Change: Nonspecific    RADIOLOGY I have independently visualized and interpreted patient's x-ray as well as noted the radiology interpretation:  X-ray: No acute cardiopulmonary process  Official radiology report(s): DG Chest 2 View  Result Date: 07/14/2022 CLINICAL DATA:  Filling read for 3 days. Recent vomiting and diarrhea EXAM: CHEST - 2 VIEW COMPARISON:  X-ray 07/06/2021 and older FINDINGS: Neurostimulator in place with tip along the midthoracic spine. Surgical clips along both axillary regions. No consolidation, pneumothorax or effusion. No edema. Degenerative changes of the thoracic spine and shoulders. Normal cardiopericardial silhouette. IMPRESSION: No acute cardiopulmonary disease.  Neurostimulator Electronically Signed   By: Karen Kays M.D.   On: 07/14/2022 20:21      PROCEDURES:  Critical Care performed: No  Procedures   MEDICATIONS ORDERED IN ED: Medications  sodium chloride 0.9 % bolus 1,000 mL (has no administration in time range)  fosfomycin (MONUROL) packet 3 g (has no administration in time range)     IMPRESSION / MDM / ASSESSMENT AND PLAN / ED COURSE  I reviewed the triage vital signs and the nursing notes.                             72 year old female presenting with generalized weakness, recent nausea/vomiting/diarrhea.  Differential diagnosis includes but is not limited to ACS, infectious, metabolic etiologies, etc.  Personally reviewed patient's records and note a pain medicine office visit on 07/02/2022 for chronic pain symptoms.  Patient's presentation is most consistent with acute presentation with potential threat to life or bodily function.  Laboratory results demonstrate mild leukocytosis with WBC 12.6, AKI BUN 28/creatinine 1.63, negative troponin, small leukocytes with 11-20 WBCs in urine.  Chest x-ray is negative.  Will check thyroid panel.  Initiate IV fluid hydration, fosfomycin for UTI and reassess.  Clinical Course as of 07/15/22 0450  Tue Jul 15, 2022  0158 Patient resting in no acute distress, voices no complaints at this time.  Updated her on unremarkable thyroid panel.  She is overall feeling significantly better.  Strict return precautions given.  Patient verbalizes understanding and agrees with plan of care. [JS]    Clinical Course User Index [JS] Irean Hong, MD     FINAL CLINICAL IMPRESSION(S) / ED DIAGNOSES   Final diagnoses:  Generalized weakness  Dehydration  Lower urinary tract infectious disease     Rx / DC Orders   ED Discharge Orders     None        Note:  This document was prepared using Dragon voice recognition software and may include unintentional dictation errors.   Irean Hong, MD 07/15/22 [phone]

## 2022-08-05 DIAGNOSIS — D119 Benign neoplasm of major salivary gland, unspecified: Secondary | ICD-10-CM | POA: Diagnosis not present

## 2022-09-03 DIAGNOSIS — D485 Neoplasm of uncertain behavior of skin: Secondary | ICD-10-CM | POA: Diagnosis not present

## 2022-09-03 DIAGNOSIS — L858 Other specified epidermal thickening: Secondary | ICD-10-CM | POA: Diagnosis not present

## 2022-09-03 DIAGNOSIS — I8312 Varicose veins of left lower extremity with inflammation: Secondary | ICD-10-CM | POA: Diagnosis not present

## 2022-09-03 DIAGNOSIS — L812 Freckles: Secondary | ICD-10-CM | POA: Diagnosis not present

## 2022-09-03 DIAGNOSIS — L821 Other seborrheic keratosis: Secondary | ICD-10-CM | POA: Diagnosis not present

## 2022-09-03 DIAGNOSIS — L820 Inflamed seborrheic keratosis: Secondary | ICD-10-CM | POA: Diagnosis not present

## 2022-09-03 DIAGNOSIS — I8311 Varicose veins of right lower extremity with inflammation: Secondary | ICD-10-CM | POA: Diagnosis not present

## 2022-09-03 DIAGNOSIS — I872 Venous insufficiency (chronic) (peripheral): Secondary | ICD-10-CM | POA: Diagnosis not present

## 2022-09-03 DIAGNOSIS — C44719 Basal cell carcinoma of skin of left lower limb, including hip: Secondary | ICD-10-CM | POA: Diagnosis not present

## 2022-09-03 DIAGNOSIS — L308 Other specified dermatitis: Secondary | ICD-10-CM | POA: Diagnosis not present

## 2022-09-03 DIAGNOSIS — D045 Carcinoma in situ of skin of trunk: Secondary | ICD-10-CM | POA: Diagnosis not present

## 2022-09-25 DIAGNOSIS — K5903 Drug induced constipation: Secondary | ICD-10-CM | POA: Diagnosis not present

## 2022-09-25 DIAGNOSIS — M5441 Lumbago with sciatica, right side: Secondary | ICD-10-CM | POA: Diagnosis not present

## 2022-09-25 DIAGNOSIS — M62838 Other muscle spasm: Secondary | ICD-10-CM | POA: Diagnosis not present

## 2022-09-25 DIAGNOSIS — M545 Low back pain, unspecified: Secondary | ICD-10-CM | POA: Diagnosis not present

## 2022-09-25 DIAGNOSIS — M5442 Lumbago with sciatica, left side: Secondary | ICD-10-CM | POA: Diagnosis not present

## 2022-09-25 DIAGNOSIS — G8929 Other chronic pain: Secondary | ICD-10-CM | POA: Diagnosis not present

## 2022-09-25 DIAGNOSIS — F1190 Opioid use, unspecified, uncomplicated: Secondary | ICD-10-CM | POA: Diagnosis not present

## 2022-09-25 DIAGNOSIS — M533 Sacrococcygeal disorders, not elsewhere classified: Secondary | ICD-10-CM | POA: Diagnosis not present

## 2022-10-08 ENCOUNTER — Other Ambulatory Visit: Payer: Self-pay

## 2022-10-14 DIAGNOSIS — E782 Mixed hyperlipidemia: Secondary | ICD-10-CM | POA: Diagnosis not present

## 2022-10-14 DIAGNOSIS — D649 Anemia, unspecified: Secondary | ICD-10-CM | POA: Diagnosis not present

## 2022-10-14 DIAGNOSIS — I739 Peripheral vascular disease, unspecified: Secondary | ICD-10-CM | POA: Diagnosis not present

## 2022-10-14 DIAGNOSIS — E538 Deficiency of other specified B group vitamins: Secondary | ICD-10-CM | POA: Diagnosis not present

## 2022-10-14 DIAGNOSIS — N183 Chronic kidney disease, stage 3 unspecified: Secondary | ICD-10-CM | POA: Diagnosis not present

## 2022-10-14 DIAGNOSIS — I5189 Other ill-defined heart diseases: Secondary | ICD-10-CM | POA: Diagnosis not present

## 2022-10-14 DIAGNOSIS — I129 Hypertensive chronic kidney disease with stage 1 through stage 4 chronic kidney disease, or unspecified chronic kidney disease: Secondary | ICD-10-CM | POA: Diagnosis not present

## 2022-10-14 DIAGNOSIS — F3181 Bipolar II disorder: Secondary | ICD-10-CM | POA: Diagnosis not present

## 2022-10-22 DIAGNOSIS — H3554 Dystrophies primarily involving the retinal pigment epithelium: Secondary | ICD-10-CM | POA: Diagnosis not present

## 2022-10-22 DIAGNOSIS — H2513 Age-related nuclear cataract, bilateral: Secondary | ICD-10-CM | POA: Diagnosis not present

## 2022-10-23 ENCOUNTER — Telehealth: Payer: Self-pay | Admitting: Nurse Practitioner

## 2022-10-28 DIAGNOSIS — K635 Polyp of colon: Secondary | ICD-10-CM | POA: Diagnosis not present

## 2022-10-28 DIAGNOSIS — K6389 Other specified diseases of intestine: Secondary | ICD-10-CM | POA: Diagnosis not present

## 2022-10-28 DIAGNOSIS — Z86010 Personal history of colonic polyps: Secondary | ICD-10-CM | POA: Diagnosis not present

## 2022-10-28 DIAGNOSIS — K648 Other hemorrhoids: Secondary | ICD-10-CM | POA: Diagnosis not present

## 2022-10-28 DIAGNOSIS — K5520 Angiodysplasia of colon without hemorrhage: Secondary | ICD-10-CM | POA: Diagnosis not present

## 2022-10-28 DIAGNOSIS — Z09 Encounter for follow-up examination after completed treatment for conditions other than malignant neoplasm: Secondary | ICD-10-CM | POA: Diagnosis not present

## 2022-10-28 DIAGNOSIS — D125 Benign neoplasm of sigmoid colon: Secondary | ICD-10-CM | POA: Diagnosis not present

## 2022-10-30 DIAGNOSIS — D125 Benign neoplasm of sigmoid colon: Secondary | ICD-10-CM | POA: Diagnosis not present

## 2022-10-30 DIAGNOSIS — K635 Polyp of colon: Secondary | ICD-10-CM | POA: Diagnosis not present

## 2022-12-24 DIAGNOSIS — M533 Sacrococcygeal disorders, not elsewhere classified: Secondary | ICD-10-CM | POA: Diagnosis not present

## 2022-12-24 DIAGNOSIS — M62838 Other muscle spasm: Secondary | ICD-10-CM | POA: Diagnosis not present

## 2022-12-24 DIAGNOSIS — Z5181 Encounter for therapeutic drug level monitoring: Secondary | ICD-10-CM | POA: Diagnosis not present

## 2022-12-24 DIAGNOSIS — M5442 Lumbago with sciatica, left side: Secondary | ICD-10-CM | POA: Diagnosis not present

## 2022-12-24 DIAGNOSIS — M5441 Lumbago with sciatica, right side: Secondary | ICD-10-CM | POA: Diagnosis not present

## 2022-12-24 DIAGNOSIS — F1190 Opioid use, unspecified, uncomplicated: Secondary | ICD-10-CM | POA: Diagnosis not present

## 2022-12-24 DIAGNOSIS — M545 Low back pain, unspecified: Secondary | ICD-10-CM | POA: Diagnosis not present

## 2022-12-24 DIAGNOSIS — K5903 Drug induced constipation: Secondary | ICD-10-CM | POA: Diagnosis not present

## 2022-12-24 DIAGNOSIS — Z79899 Other long term (current) drug therapy: Secondary | ICD-10-CM | POA: Diagnosis not present

## 2023-02-18 DIAGNOSIS — F1190 Opioid use, unspecified, uncomplicated: Secondary | ICD-10-CM | POA: Diagnosis not present

## 2023-02-18 DIAGNOSIS — K5903 Drug induced constipation: Secondary | ICD-10-CM | POA: Diagnosis not present

## 2023-02-18 DIAGNOSIS — M545 Low back pain, unspecified: Secondary | ICD-10-CM | POA: Diagnosis not present

## 2023-02-18 DIAGNOSIS — M533 Sacrococcygeal disorders, not elsewhere classified: Secondary | ICD-10-CM | POA: Diagnosis not present

## 2023-02-18 DIAGNOSIS — M5441 Lumbago with sciatica, right side: Secondary | ICD-10-CM | POA: Diagnosis not present

## 2023-02-18 DIAGNOSIS — M62838 Other muscle spasm: Secondary | ICD-10-CM | POA: Diagnosis not present

## 2023-02-18 DIAGNOSIS — M5442 Lumbago with sciatica, left side: Secondary | ICD-10-CM | POA: Diagnosis not present

## 2023-03-01 ENCOUNTER — Other Ambulatory Visit: Payer: Self-pay | Admitting: Hematology

## 2023-03-01 DIAGNOSIS — C50411 Malignant neoplasm of upper-outer quadrant of right female breast: Secondary | ICD-10-CM

## 2023-03-02 ENCOUNTER — Encounter: Payer: Self-pay | Admitting: Hematology

## 2023-04-30 ENCOUNTER — Encounter: Payer: Self-pay | Admitting: Family Medicine

## 2023-06-09 ENCOUNTER — Encounter: Payer: PPO | Admitting: Nurse Practitioner

## 2023-06-09 ENCOUNTER — Telehealth: Payer: Self-pay

## 2023-06-09 NOTE — Progress Notes (Deleted)
 CLINIC:  Survivorship   REASON FOR VISIT:  Routine follow-up post-treatment for a recent history of breast cancer.  BRIEF ONCOLOGIC HISTORY:  Oncology History Overview Note  Breast cancer of upper-outer quadrant of RIGHT female breast (HCC)   Staging form: Breast, AJCC 7th Edition Clinical stage from 09/06/2014: Stage 0 (Tis (DCIS), N0, M0)  Pathologic stage from 01/25/2015: Stage 0 (Tis (DCIS), N0, cM0)  ------  Breast cancer of upper-inner quadrant of LEFT female breast (HCC)   Staging form: Breast, AJCC 7th Edition Clinical stage from 06/20/2015: Stage IA (T1c, N0, M0)      Pathologic stage from 07/05/2015: Stage IIA (T2, N0, cM0)    Ductal carcinoma in situ (DCIS) of right breast  08/23/2014 Mammogram   Mammogram and ultrasound showed a 3cm cystic lesion at 8:30 of right breast, and a 7 mm and a 5 mm increased density lesion at 10:00 of the right breast.   08/29/2014 Initial Biopsy   Right breast core needle bx x 2: high-grade DCIS, with focus of mildly suspicious for early stromal invasion. ER- (0%), PR- (0%).    08/29/2014 Clinical Stage   Stage 0: Tis N0   09/13/2014 Surgery   Right lumpectomy/SLNB ([facility]): High grade DCIS with necrosis and calcfications, positive margins. 5 LN removed and negative for malignancy (0/5). Grade 3.   09/13/2014 Pathologic Stage   Stage 0: Tis N0   09/29/2014 Surgery   Re-excision for positive margins; multiple breast margins re-excised, high-grade DCIS, measuring 1.0 cm, 3.0 cm, 2.0 cm, some margins are still positive or very close.    01/25/2015 Definitive Surgery   Right nipple-sparing mastectomy Donell Beers): DCIS with calcifications, high grade, spans 9 cm, LCIS, fibrocystic changes with adenosis and calcifications, negative margins. 1 LN removed and negative. Imeediate reconstruction   03/23/2015 Survivorship   Survivorship care plan completed and mailed to patient in lieu of in person visit at request of patient.   04/23/2021 Imaging     IMPRESSION: 1. Palpable complaint is a 13 mm mass/neoplasm in the left parotid tail. 2. No adenopathy in the neck or upper chest to correlate with breast cancer history.   Breast cancer of upper-inner quadrant of left female breast (HCC)  06/28/2013 Imaging   DEXA scan: Osteopenia [facility])   06/19/2015 Mammogram   Diagnostic mammogram and ultrasound showed a 1.3 cm in the upper inner quadrant of left breast, and a 0.9 mm simple cyst. Axilla was negative.   06/20/2015 Receptors her2   ER 100% positive, PR negative, Ki-67 30%, HER-2 negative   06/20/2015 Initial Biopsy   The left breast upper inner quadrant mass biopsy showed invasive ductal carcinoma, grade 2   06/20/2015 Initial Diagnosis   Breast cancer of upper-inner quadrant of left female breast (HCC)   07/05/2015 Surgery   Left breast nipple-sparing mastectomy and sentinel lymph node biopsy with immediate reconstruction [facility]ell Beers); [facility] with [facility]. Odis Luster [facility]   07/05/2015 Pathology Results   Left mastectomy: Invasive ductal carcinoma with extracellular mucin, grade 2, spanning 2.0 cm, margins were negative, lobular carcinoma in situ, 3 sentinel lymph nodes negative. PR repeated and remains negative. HER2 remains neg (ratio 1.21)   07/05/2015 Pathologic Stage   pT2, pN0: Stage IIA    07/05/2015 Oncotype testing   RS 28 (intermediate risk), which predicts 10 year risk of distant recurrence 18% with tamoxifen alone   07/30/2015 - 08/30/2015 Anti-estrogen oral therapy   Adjuvant exemestane 25 mg once daily, stopped after one month due to skin rash.  09/01/2015 -  Anti-estrogen oral therapy   Switched to anastrozole 1mg  daily.  Planned duration of treatment : 5-10 years Mosetta Putt)     INTERVAL HISTORY:  Ms. Rau presents to the Survivorship Clinic today for our initial meeting to review her survivorship care plan detailing her treatment course for breast cancer, as well as monitoring long-term side effects of that  treatment, education regarding health maintenance, screening, and overall wellness and health promotion.     Overall, Ms. Hohman reports feeling quite well since completing her radiation therapy approximately 3 months ago.  She ***    REVIEW OF SYSTEMS:  Review of Systems - Oncology Breast: Denies any new nodularity, masses, tenderness, nipple changes, or nipple discharge.      ONCOLOGY TREATMENT TEAM:  1. Surgeon:  Dr. Marland Kitchen at [facility] 2. Medical Oncologist: Dr. Marland Kitchen  3. Radiation Oncologist: Dr. Marland Kitchen    PAST MEDICAL/SURGICAL HISTORY:  Past Medical History:  Diagnosis Date   Anemia    Anxiety    takes Xanax daily as needed   Arthritis    Bilateral lower extremity edema    takes Furosemide daily as needed   Breast cancer [facility]) oncologist-  dr Malachy Mood--  right upper-outer quadrant    dx May 2016--- Stage 0  (Tis,N0,M0)  DCIS  Right breast---  09-13-2014  s/p  radioactive seed/ partial mastectomy / sln bx   Chronic fatigue syndrome    Chronic low back pain    Complication of anesthesia    spinal cord stimulator- to be off for surgery   Depression    takes Effexor daily   Family history of adverse reaction to anesthesia    mom hard to wake up   Fibromyalgia    Headache(784.0)    couple of times a week   Heart murmur    History of bronchitis > 46yrs ago   History of colon polyps    benign   History of drug overdose    oxycontin  and oxycodone  Sept 2015--  now has narcan injection prescription   History of DVT of lower extremity    2008--  BILATERAL   HTN (hypertension)    takes Amlodipine and Ramipril daily   Hyperlipidemia    takes Zetia and Crestor  daily   Joint pain    Mitral valve prolapse    MILD /   PER PT ASYMPTOMATIC   Muscle spasm    takes Zanaflex daily as needed   Osteoporosis    Peripheral neuropathy    takes Neurontin daily   Peripheral vascular disease (HCC)    hx dvt's   RSD (reflex sympathetic dystrophy)    left wrist and  forearm from a fx   RSD (reflex sympathetic dystrophy)    Sjogren's disease (HCC)    Vertigo    takes Meclizine daily    Past Surgical History:  Procedure Laterality Date   BACK SURGERY     BREAST RECONSTRUCTION WITH PLACEMENT OF TISSUE EXPANDER AND FLEX HD (ACELLULAR HYDRATED DERMIS) Right 01/25/2015   Procedure: RIGHT BREAST RECONSTRUCTION WITH PLACEMENT OF IMPLANT AND ACELLULAR DERMAL MATRIX ;  Surgeon: Etter Sjogren, MD;  Location: [facility];  Service: Plastics;  Laterality: Right;   BREAST RECONSTRUCTION WITH PLACEMENT OF TISSUE EXPANDER AND FLEX HD (ACELLULAR HYDRATED DERMIS) Left 07/05/2015   Procedure: LEFT BREAST RECONSTRUCTION WITH PLACEMENT OF TISSUE EXPANDER AND  ACELLULAR DERMAL MATRIX ;  Surgeon: Etter Sjogren, MD;  Location: [facility];  Service: Plastics;  Laterality: Left;  COLONOSCOPY     ESOPHAGOGASTRODUODENOSCOPY     eye lid surgery Bilateral    FOOT SURGERY Bilateral 1996   MORTON'S NEUROMA   INNER EAR SURGERY Right    LUMBAR DISC SURGERY     MASTECTOMY Right 01/25/2015   nipple sparing    NASAL SINUS SURGERY     NIPPLE SPARING MASTECTOMY/SENTINAL LYMPH NODE BIOPSY/RECONSTRUCTION/PLACEMENT OF TISSUE EXPANDER Left 07/05/2015   Procedure: LEFT NIPPLE SPARING MASTECTOMY WITH SENTINAL LYMPH NODE BIOPSY ;  Surgeon: Almond Lint, MD;  Location: [facility];  Service: General;  Laterality: Left;   OPEN REDUCTION INTERNAL FIXATION (ORIF) DISTAL RADIAL FRACTURE Right 08/09/2018   Procedure: OPEN REDUCTION INTERNAL FIXATION (ORIF) DISTAL RADIAL FRACTURE;  Surgeon: Mack Hook, MD;  Location: [facility];  Service: Orthopedics;  Laterality: Right;   ORIF LEFT WRIST FX'S/  LEFT CARPAL TUNNEL RELEASE  1999   PAROTIDECTOMY Left 06/11/2022   Procedure: PAROTIDECTOMY WITH FACIAL NERVE DISSECTION AND MONITORING;  Surgeon: Serena Colonel, MD;  Location: [facility];  Service: ENT;  Laterality: Left;   POSTERIOR LUMBAR FUSION  01-30-2009   L4--5 Laminectmy w/ decompression/  bilateral L4--5  microdiskectomy and fusion   RADIOACTIVE SEED GUIDED PARTIAL MASTECTOMY WITH AXILLARY SENTINEL LYMPH NODE BIOPSY Right 09/13/2014   Procedure: RADIOACTIVE SEED GUIDED PARTIAL MASTECTOMY WITH AXILLARY SENTINEL LYMPH NODE BIOPSY;  Surgeon: Almond Lint, MD;  Location: [facility];  Service: General;  Laterality: Right;   RE-EXCISION OF BREAST LUMPECTOMY Right 09/29/2014   Procedure: RE-EXCISION OF BREAST LUMPECTOMY;  Surgeon: Almond Lint, MD;  Location: [facility];  Service: General;  Laterality: Right;   RIGHT CARPAL TUNNEL RELEASE/  TRIGGER RELEASE RIGHT MIDDLE FINGER  06-18-2006   RIGHT INFERIOR PARATHYROIDECTOMY  04-07-2006   SIMPLE MASTECTOMY WITH AXILLARY SENTINEL NODE BIOPSY Right 01/25/2015   Procedure: RIGHT NIPPLE SPARING MASTECTOMY;  Surgeon: Almond Lint, MD;  Location: [facility];  Service: General;  Laterality: Right;   SPINAL CORD STIMULATOR IMPLANT  2013   TONSILLECTOMY  as child     ALLERGIES:  Allergies  Allergen Reactions   Carbocaine [Mepivacaine Hcl] Hives and Swelling   Penicillins Nausea And Vomiting, Swelling, Rash and Other (See Comments)    Has patient had a PCN reaction causing immediate rash, facial/tongue/throat swelling, SOB or lightheadedness with hypotension: Yes Has patient had a PCN reaction causing severe rash involving mucus membranes or skin necrosis: No Has patient had a PCN reaction that required hospitalization No Has patient had a PCN reaction occurring within the last 10 years: Yes If all of the above answers are "NO", then may proceed with Cephalosporin use.   Sulfa Antibiotics Nausea And Vomiting, Swelling, Rash and Other (See Comments)    Headache    Latex Rash and Other (See Comments)    Reaction to gloves and bandaids (paper tape is ok)     CURRENT MEDICATIONS:  Outpatient Encounter Medications as of 06/09/2023  Medication Sig Note   ALPRAZolam (XANAX) 1 MG tablet Take 1 mg by mouth 3 (three) times daily as needed  for anxiety or sleep (nerves).    amLODipine (NORVASC) 10 MG tablet Take 1 tablet (10 mg total) by mouth daily.    anastrozole (ARIMIDEX) 1 MG tablet Take 1 tablet (1 mg total) by mouth daily.    Ascorbic Acid (VITAMIN C) 1000 MG tablet Take 1,000 mg by mouth daily with lunch.    aspirin EC 81 MG tablet Take 81 mg by mouth daily. Swallow whole.    B  Complex-C (B-COMPLEX WITH VITAMIN C) tablet Take 1 tablet by mouth daily.    brexpiprazole (REXULTI) 1 MG TABS tablet Take 1 mg by mouth 2 (two) times daily with breakfast and lunch.    calcium carbonate (OS-CAL) 600 MG TABS tablet Take 600 mg by mouth daily with lunch.    Cholecalciferol (VITAMIN D) 50 MCG (2000 UT) tablet Take 2,000 Units by mouth daily with lunch.    ezetimibe (ZETIA) 10 MG tablet Take 10 mg by mouth at bedtime.  09/27/2014: .   ferrous sulfate 325 (65 FE) MG tablet Take 1 tablet (325 mg total) by mouth daily with breakfast.    Flaxseed, Linseed, (FLAX SEED OIL) 1000 MG CAPS Take 1,000 mg by mouth daily with lunch.    gabapentin (NEURONTIN) 600 MG tablet Take 1 tablet (600 mg total) by mouth at bedtime for 14 days. Take one tablet (600 mg) by mouth every morning and two tablets (1200 mg) at night (Patient taking differently: Take 600-1,200 mg by mouth See admin instructions. Take one tablet (600 mg) by mouth every morning, one tablet (600 mg) at lunch and two tablets (1200 mg) at night)    ipratropium (ATROVENT) 0.06 % nasal spray Place 1 spray into both nostrils 3 (three) times daily as needed (allergies).    Lactobacillus (PROBIOTIC ACIDOPHILUS PO) Take 1 tablet by mouth daily with lunch.    lidocaine (LIDODERM) 5 % Place 1 patch onto the skin at bedtime as needed (pain).    meclizine (ANTIVERT) 25 MG tablet Take 25 mg by mouth 4 (four) times daily as needed for dizziness.    metaxalone (SKELAXIN) 800 MG tablet Take 800 mg by mouth 3 (three) times daily as needed for muscle spasms.    naloxegol oxalate (MOVANTIK) 25 MG TABS tablet  Take 25 mg by mouth daily as needed (for constipation).    naloxone (NARCAN) nasal spray 4 mg/0.1 mL Place 1 spray into the nose once as needed (for opioid overdose).    Omega-3 Fatty Acids (FISH OIL) 1000 MG CAPS Take 1,000 mg by mouth daily with lunch.    oxyCODONE-acetaminophen (PERCOCET) 10-325 MG tablet Take 1 tablet by mouth 5 (five) times daily as needed for pain.    polyethylene glycol (MIRALAX / GLYCOLAX) 17 g packet Take 17 g by mouth daily as needed for mild constipation.    PROAIR HFA 108 (90 Base) MCG/ACT inhaler Inhale 2 puffs into the lungs every 4 (four) hours as needed for wheezing or shortness of breath.    ramipril (ALTACE) 10 MG capsule Take 10 mg by mouth daily.    rosuvastatin (CRESTOR) 20 MG tablet Take 20 mg by mouth at bedtime.    venlafaxine (EFFEXOR-XR) 150 MG 24 hr capsule Take 150 mg by mouth 2 (two) times daily with breakfast and lunch.    No facility-administered encounter medications on file as of 06/09/2023.     ONCOLOGIC FAMILY HISTORY:  Family History  Problem Relation Age of Onset   Heart murmur Mother    Cancer Mother 32       carcinoid tumor    Hypertension Mother    Heart murmur Sister        x2   Cancer Maternal Aunt        lung cancer   Cancer Paternal Aunt        breast cancer    Cancer Maternal Aunt        gastric cancer    Cancer Cousin  lung cancer   Cancer Cousin        bone cancer    Heart attack Maternal Uncle    Heart attack Paternal Uncle    Hypertension Son    Stroke Neg Hx      GENETIC COUNSELING/TESTING: ***  SOCIAL HISTORY:  RIMSHA TREMBLEY is /single/married/divorced/widowed/separated and lives alone/with her spouse/family/friend in (city), [place].  She has (#) children and they live in (city).  Ms. Balestrieri is currently retired/disabled/working part-time/full-time as ***.  She denies any current or history of tobacco, alcohol, or illicit drug use.     PHYSICAL EXAMINATION:  Vital Signs:  There were  no vitals filed for this visit. There were no vitals filed for this visit. General: Well-nourished, well-appearing female in no acute distress.  She is unaccompanied/accompanied in clinic by her ***** today.   HEENT: Head is normocephalic.  Pupils equal and reactive to light. Conjunctivae clear without exudate.  Sclerae anicteric. Oral mucosa is pink, moist.  Oropharynx is pink without lesions or erythema.  Lymph: No cervical, supraclavicular, or infraclavicular lymphadenopathy noted on palpation.  Cardiovascular: Regular rate and rhythm.Marland Kitchen Respiratory: Clear to auscultation bilaterally. Chest expansion symmetric; breathing non-labored.  GI: Abdomen soft and round; non-tender, non-distended. Bowel sounds normoactive.  GU: Deferred.  Neuro: No focal deficits. Steady gait.  Psych: Mood and affect normal and appropriate for situation.  Extremities: No edema. MSK: No focal spinal tenderness to palpation.  Full range of motion in bilateral upper extremities Skin: Warm and dry.  LABORATORY DATA:  None for this visit.  DIAGNOSTIC IMAGING:  None for this visit.      ASSESSMENT AND PLAN:  Ms.. Verhagen is a pleasant 73 y.o. female with Stage *** right/left breast invasive ductal carcinoma, ER+/PR+/HER2-, diagnosed in (date), treated with lumpectomy, adjuvant radiation therapy, and anti-estrogen therapy with *** beginning in (date).  She presents to the Survivorship Clinic for our initial meeting and routine follow-up post-completion of treatment for breast cancer.    1. Stage *** right/left breast cancer:  Ms. Terhaar is continuing to recover from definitive treatment for breast cancer. She will follow-up with her medical oncologist, Dr. Lucrezia Starch in (month) /2017 with history and physical exam per surveillance protocol.  She will continue her anti-estrogen therapy with (drug). Thus far, she is tolerating the *** well, with minimal side effects. She was instructed to make Dr. Pamelia Hoit or  myself aware if she begins to experience any worsening side effects of the medication and I could see her back in clinic to help manage those side effects, as needed. Though the incidence is low, there is an associated risk of endometrial cancer with anti-estrogen therapies like Tamoxifen.  Ms. Poppen was encouraged to contact Dr. Devona Konig or myself with any vaginal bleeding while taking Tamoxifen. Other side effects of Tamoxifen were again reviewed with her as well. Today, a comprehensive survivorship care plan and treatment summary was reviewed with the patient today detailing her breast cancer diagnosis, treatment course, potential late/long-term effects of treatment, appropriate follow-up care with recommendations for the future, and patient education resources.  A copy of this summary, along with a letter will be sent to the patient's primary care provider via mail/fax/In Basket message after today's visit.    #. Problem(s) at Visit______________  #. Bone health:  Given Ms. Zingg's age/history of breast cancer and her current treatment regimen including anti-estrogen therapy with _______, she is at risk for bone demineralization.  Her last DEXA scan was **/**/20**, which showed (results).***  In the meantime, she was encouraged to increase her consumption of foods rich in calcium, as well as increase her weight-bearing activities.  She was given education on specific activities to promote bone health.  #. Cancer screening:  Due to Ms. Kister's history and her age, she should receive screening for skin cancers, colon cancer, and gynecologic cancers.  The information and recommendations are listed on the patient's comprehensive care plan/treatment summary and were reviewed in detail with the patient.    #. Health maintenance and wellness promotion: Ms. Birks was encouraged to consume 5-7 servings of fruits and vegetables per day. We reviewed the "Nutrition Rainbow" handout, as well as the  handout "Take Control of Your Health and Reduce Your Cancer Risk" from the [employer].  She was also encouraged to engage in moderate to vigorous exercise for 30 minutes per day most days of the week. We discussed the [employer] fitness program, which is designed for cancer survivors to help them become more physically fit after cancer treatments.  She was instructed to limit her alcohol consumption and continue to abstain from tobacco use/***was encouraged stop smoking.     #. Support services/counseling: It is not uncommon for this period of the patient's cancer care trajectory to be one of many emotions and stressors.  We discussed an opportunity for her to participate in the next session of [facility] ("Finding Your New Normal") support group series designed for patients after they have completed treatment.   Ms. Mcdougald was encouraged to take advantage of our many other support services programs, support groups, and/or counseling in coping with her new life as a cancer survivor after completing anti-cancer treatment.  She was offered support today through active listening and expressive supportive counseling.  She was given information regarding our available services and encouraged to contact me with any questions or for help enrolling in any of our support group/programs.    Dispo:   -Return to cancer center ***  -Mammogram due in *** -Follow up with surgery *** -She is welcome to return back to the Survivorship Clinic at any time; no additional follow-up needed at this time.  -Consider referral back to survivorship as a long-term survivor for continued surveillance  A total of (30) minutes of face-to-face time was spent with this patient with greater than 50% of that time in counseling and care-coordination.   Santiago Glad, NP Survivorship Program [facility] [phone]   Note: PRIMARY CARE PROVIDER Lupita Raider, MD [phone]

## 2023-06-09 NOTE — Telephone Encounter (Signed)
 Called patient due to not showing up for her 1030 lab appointment. LM on VM no one answered the phone. Will no show patient for all appointments.

## 2023-06-19 DIAGNOSIS — S29012A Strain of muscle and tendon of back wall of thorax, initial encounter: Secondary | ICD-10-CM | POA: Diagnosis not present

## 2023-06-19 DIAGNOSIS — M791 Myalgia, unspecified site: Secondary | ICD-10-CM | POA: Diagnosis not present

## 2023-06-30 ENCOUNTER — Inpatient Hospital Stay: Attending: Nurse Practitioner | Admitting: Nurse Practitioner

## 2023-06-30 ENCOUNTER — Encounter: Payer: Self-pay | Admitting: Nurse Practitioner

## 2023-06-30 ENCOUNTER — Other Ambulatory Visit: Payer: Self-pay | Admitting: Nurse Practitioner

## 2023-06-30 VITALS — BP 121/60 | HR 88 | Temp 97.6°F | Ht 62.0 in | Wt 116.8 lb

## 2023-06-30 DIAGNOSIS — D0511 Intraductal carcinoma in situ of right breast: Secondary | ICD-10-CM | POA: Diagnosis not present

## 2023-06-30 DIAGNOSIS — Z9013 Acquired absence of bilateral breasts and nipples: Secondary | ICD-10-CM | POA: Insufficient documentation

## 2023-06-30 DIAGNOSIS — F17210 Nicotine dependence, cigarettes, uncomplicated: Secondary | ICD-10-CM | POA: Diagnosis not present

## 2023-06-30 DIAGNOSIS — Z170 Estrogen receptor positive status [ER+]: Secondary | ICD-10-CM

## 2023-06-30 DIAGNOSIS — C50212 Malignant neoplasm of upper-inner quadrant of left female breast: Secondary | ICD-10-CM | POA: Diagnosis not present

## 2023-06-30 DIAGNOSIS — N189 Chronic kidney disease, unspecified: Secondary | ICD-10-CM | POA: Insufficient documentation

## 2023-06-30 DIAGNOSIS — Z08 Encounter for follow-up examination after completed treatment for malignant neoplasm: Secondary | ICD-10-CM | POA: Insufficient documentation

## 2023-06-30 DIAGNOSIS — Z853 Personal history of malignant neoplasm of breast: Secondary | ICD-10-CM | POA: Insufficient documentation

## 2023-06-30 DIAGNOSIS — M810 Age-related osteoporosis without current pathological fracture: Secondary | ICD-10-CM | POA: Insufficient documentation

## 2023-06-30 DIAGNOSIS — Z86000 Personal history of in-situ neoplasm of breast: Secondary | ICD-10-CM | POA: Insufficient documentation

## 2023-06-30 NOTE — Progress Notes (Addendum)
 Patient Care Team: Lupita Raider, MD as PCP - General (Family Medicine) Hubbard Hartshorn, NP (Inactive) as Nurse Practitioner (Nurse Practitioner) Almond Lint, MD as Consulting Physician (General Surgery) Malachy Mood, MD as Consulting Physician (Hematology) Salomon Fick, NP as Nurse Practitioner (Hematology and Oncology) Etter Sjogren, MD as Consulting Physician (Plastic Surgery) Serena Colonel, MD as Consulting Physician (Otolaryngology)   CHIEF COMPLAINT: Follow-up bilateral breast cancer  Oncology History Overview Note  Breast cancer of upper-outer quadrant of RIGHT female breast [facility])   Staging form: Breast, AJCC 7th Edition Clinical stage from 09/06/2014: Stage 0 (Tis (DCIS), N0, M0)  Pathologic stage from 01/25/2015: Stage 0 (Tis (DCIS), N0, cM0)  ------  Breast cancer of upper-inner quadrant of LEFT female breast (HCC)   Staging form: Breast, AJCC 7th Edition Clinical stage from 06/20/2015: Stage IA (T1c, N0, M0)      Pathologic stage from 07/05/2015: Stage IIA (T2, N0, cM0)    Ductal carcinoma in situ (DCIS) of right breast  08/23/2014 Mammogram   Mammogram and ultrasound showed a 3cm cystic lesion at 8:30 of right breast, and a 7 mm and a 5 mm increased density lesion at 10:00 of the right breast.   08/29/2014 Initial Biopsy   Right breast core needle bx x 2: high-grade DCIS, with focus of mildly suspicious for early stromal invasion. ER- (0%), PR- (0%).    08/29/2014 Clinical Stage   Stage 0: Tis N0   09/13/2014 Surgery   Right lumpectomy/SLNB (Byerly): High grade DCIS with necrosis and calcfications, positive margins. 5 LN removed and negative for malignancy (0/5). Grade 3.   09/13/2014 Pathologic Stage   Stage 0: Tis N0   09/29/2014 Surgery   Re-excision for positive margins; multiple breast margins re-excised, high-grade DCIS, measuring 1.0 cm, 3.0 cm, 2.0 cm, some margins are still positive or very close.    01/25/2015 Definitive Surgery   Right  nipple-sparing mastectomy Donell Beers): DCIS with calcifications, high grade, spans 9 cm, LCIS, fibrocystic changes with adenosis and calcifications, negative margins. 1 LN removed and negative. Imeediate reconstruction   03/23/2015 Survivorship   Survivorship care plan completed and mailed to patient in lieu of in person visit at request of patient.   04/23/2021 Imaging    IMPRESSION: 1. Palpable complaint is a 13 mm mass/neoplasm in the left parotid tail. 2. No adenopathy in the neck or upper chest to correlate with breast cancer history.   Breast cancer of upper-inner quadrant of left female breast (HCC)  06/28/2013 Imaging   DEXA scan: Osteopenia [facility])   06/19/2015 Mammogram   Diagnostic mammogram and ultrasound showed a 1.3 cm in the upper inner quadrant of left breast, and a 0.9 mm simple cyst. Axilla was negative.   06/20/2015 Receptors her2   ER 100% positive, PR negative, Ki-67 30%, HER-2 negative   06/20/2015 Initial Biopsy   The left breast upper inner quadrant mass biopsy showed invasive ductal carcinoma, grade 2   06/20/2015 Initial Diagnosis   Breast cancer of upper-inner quadrant of left female breast (HCC)   07/05/2015 Surgery   Left breast nipple-sparing mastectomy and sentinel lymph node biopsy with immediate reconstruction Donell Beers); reconstruction with Dr. Odis Luster at [facility]   07/05/2015 Pathology Results   Left mastectomy: Invasive ductal carcinoma with extracellular mucin, grade 2, spanning 2.0 cm, margins were negative, lobular carcinoma in situ, 3 sentinel lymph nodes negative. PR repeated and remains negative. HER2 remains neg (ratio 1.21)   07/05/2015 Pathologic Stage   pT2, pN0: Stage  IIA    07/05/2015 Oncotype testing   RS 28 (intermediate risk), which predicts 10 year risk of distant recurrence 18% with tamoxifen alone   07/30/2015 - 08/30/2015 Anti-estrogen oral therapy   Adjuvant exemestane 25 mg once daily, stopped after one month due to skin rash.     09/01/2015 -  Anti-estrogen oral therapy   Switched to anastrozole 1mg  daily.  Planned duration of treatment : 5-10 years [facility])      CURRENT THERAPY: Completed antiestrogen in 2024, currently on surveillance  INTERVAL HISTORY Connie Bennett returns for annual follow-up as scheduled, doing well in the interim with no significant changes in her overall health.  Chronic back and joint pain at baseline, specifically if there are any changes in her pain which she denies. Followed by Ortho.  She is up-to-date on other age-appropriate cancer screenings and sees a PCP.  Denies concerns in her breasts.  Stopped antiestrogen therapy last year after 7 years. Woke up with vertigo this morning, takes meclizine.  ROS  All other systems reviewed and negative  Past Medical History:  Diagnosis Date   Anemia    Anxiety    takes Xanax daily as needed   Arthritis    Bilateral lower extremity edema    takes Furosemide daily as needed   Breast cancer [facility]) oncologist-  dr Malachy Mood--  right upper-outer quadrant    dx May 2016--- Stage 0  (Tis,N0,M0)  DCIS  Right breast---  09-13-2014  s/p  radioactive seed/ partial mastectomy / sln bx   Chronic fatigue syndrome    Chronic low back pain    Complication of anesthesia    spinal cord stimulator- to be off for surgery   Depression    takes Effexor daily   Family history of adverse reaction to anesthesia    mom hard to wake up   Fibromyalgia    Headache(784.0)    couple of times a week   Heart murmur    History of bronchitis > 11yrs ago   History of colon polyps    benign   History of drug overdose    oxycontin  and oxycodone  Sept 2015--  now has narcan injection prescription   History of DVT of lower extremity    2008--  BILATERAL   HTN (hypertension)    takes Amlodipine and Ramipril daily   Hyperlipidemia    takes Zetia and Crestor  daily   Joint pain    Mitral valve prolapse    MILD /   PER PT ASYMPTOMATIC   Muscle spasm    takes Zanaflex daily  as needed   Osteoporosis    Peripheral neuropathy    takes Neurontin daily   Peripheral vascular disease (HCC)    hx dvt's   RSD (reflex sympathetic dystrophy)    left wrist and forearm from a fx   RSD (reflex sympathetic dystrophy)    Sjogren's disease (HCC)    Vertigo    takes Meclizine daily      Past Surgical History:  Procedure Laterality Date   BACK SURGERY     BREAST RECONSTRUCTION WITH PLACEMENT OF TISSUE EXPANDER AND FLEX HD (ACELLULAR HYDRATED DERMIS) Right 01/25/2015   Procedure: RIGHT BREAST RECONSTRUCTION WITH PLACEMENT OF IMPLANT AND ACELLULAR DERMAL MATRIX ;  Surgeon: Etter Sjogren, MD;  Location: [facility] OR;  Service: Plastics;  Laterality: Right;   BREAST RECONSTRUCTION WITH PLACEMENT OF TISSUE EXPANDER AND FLEX HD (ACELLULAR HYDRATED DERMIS) Left 07/05/2015   Procedure: LEFT BREAST RECONSTRUCTION WITH  PLACEMENT OF TISSUE EXPANDER AND  ACELLULAR DERMAL MATRIX ;  Surgeon: Etter Sjogren, MD;  Location: [facility] OR;  Service: Plastics;  Laterality: Left;   COLONOSCOPY     ESOPHAGOGASTRODUODENOSCOPY     eye lid surgery Bilateral    FOOT SURGERY Bilateral 1996   MORTON'S NEUROMA   INNER EAR SURGERY Right    LUMBAR DISC SURGERY     MASTECTOMY Right 01/25/2015   nipple sparing    NASAL SINUS SURGERY     NIPPLE SPARING MASTECTOMY/SENTINAL LYMPH NODE BIOPSY/RECONSTRUCTION/PLACEMENT OF TISSUE EXPANDER Left 07/05/2015   Procedure: LEFT NIPPLE SPARING MASTECTOMY WITH SENTINAL LYMPH NODE BIOPSY ;  Surgeon: Almond Lint, MD;  Location: [facility] OR;  Service: General;  Laterality: Left;   OPEN REDUCTION INTERNAL FIXATION (ORIF) DISTAL RADIAL FRACTURE Right 08/09/2018   Procedure: OPEN REDUCTION INTERNAL FIXATION (ORIF) DISTAL RADIAL FRACTURE;  Surgeon: Mack Hook, MD;  Location: [facility];  Service: Orthopedics;  Laterality: Right;   ORIF LEFT WRIST FX'S/  LEFT CARPAL TUNNEL RELEASE  1999   PAROTIDECTOMY Left 06/11/2022   Procedure: PAROTIDECTOMY WITH FACIAL NERVE DISSECTION AND  MONITORING;  Surgeon: Serena Colonel, MD;  Location: [facility] OR;  Service: ENT;  Laterality: Left;   POSTERIOR LUMBAR FUSION  01-30-2009   L4--5 Laminectmy w/ decompression/  bilateral L4--5 microdiskectomy and fusion   RADIOACTIVE SEED GUIDED PARTIAL MASTECTOMY WITH AXILLARY SENTINEL LYMPH NODE BIOPSY Right 09/13/2014   Procedure: RADIOACTIVE SEED GUIDED PARTIAL MASTECTOMY WITH AXILLARY SENTINEL LYMPH NODE BIOPSY;  Surgeon: Almond Lint, MD;  Location: [facility];  Service: General;  Laterality: Right;   RE-EXCISION OF BREAST LUMPECTOMY Right 09/29/2014   Procedure: RE-EXCISION OF BREAST LUMPECTOMY;  Surgeon: Almond Lint, MD;  Location: [facility];  Service: General;  Laterality: Right;   RIGHT CARPAL TUNNEL RELEASE/  TRIGGER RELEASE RIGHT MIDDLE FINGER  06-18-2006   RIGHT INFERIOR PARATHYROIDECTOMY  04-07-2006   SIMPLE MASTECTOMY WITH AXILLARY SENTINEL NODE BIOPSY Right 01/25/2015   Procedure: RIGHT NIPPLE SPARING MASTECTOMY;  Surgeon: Almond Lint, MD;  Location: [facility] OR;  Service: General;  Laterality: Right;   SPINAL CORD STIMULATOR IMPLANT  2013   TONSILLECTOMY  as child     Outpatient Encounter Medications as of 06/30/2023  Medication Sig Note   ALPRAZolam (XANAX) 1 MG tablet Take 1 mg by mouth 3 (three) times daily as needed for anxiety or sleep (nerves).    amLODipine (NORVASC) 10 MG tablet Take 1 tablet (10 mg total) by mouth daily.    Ascorbic Acid (VITAMIN C) 1000 MG tablet Take 1,000 mg by mouth daily with lunch.    aspirin EC 81 MG tablet Take 81 mg by mouth daily. Swallow whole.    B Complex-C (B-COMPLEX WITH VITAMIN C) tablet Take 1 tablet by mouth daily.    brexpiprazole (REXULTI) 1 MG TABS tablet Take 1 mg by mouth 2 (two) times daily with breakfast and lunch.    calcium carbonate (OS-CAL) 600 MG TABS tablet Take 600 mg by mouth daily with lunch.    Cholecalciferol (VITAMIN D) 50 MCG (2000 UT) tablet Take 2,000 Units by mouth daily with lunch.    ezetimibe  (ZETIA) 10 MG tablet Take 10 mg by mouth at bedtime.  09/27/2014: .   ferrous sulfate 325 (65 FE) MG tablet Take 1 tablet (325 mg total) by mouth daily with breakfast.    Flaxseed, Linseed, (FLAX SEED OIL) 1000 MG CAPS Take 1,000 mg by mouth daily with lunch.    ipratropium (ATROVENT) 0.06 %  nasal spray Place 1 spray into both nostrils 3 (three) times daily as needed (allergies).    Lactobacillus (PROBIOTIC ACIDOPHILUS PO) Take 1 tablet by mouth daily with lunch.    lidocaine (LIDODERM) 5 % Place 1 patch onto the skin at bedtime as needed (pain).    meclizine (ANTIVERT) 25 MG tablet Take 25 mg by mouth 4 (four) times daily as needed for dizziness.    metaxalone (SKELAXIN) 800 MG tablet Take 800 mg by mouth 3 (three) times daily as needed for muscle spasms.    naloxegol oxalate (MOVANTIK) 25 MG TABS tablet Take 25 mg by mouth daily as needed (for constipation).    naloxone (NARCAN) nasal spray 4 mg/0.1 mL Place 1 spray into the nose once as needed (for opioid overdose).    Omega-3 Fatty Acids (FISH OIL) 1000 MG CAPS Take 1,000 mg by mouth daily with lunch.    oxyCODONE-acetaminophen (PERCOCET) 10-325 MG tablet Take 1 tablet by mouth 5 (five) times daily as needed for pain.    polyethylene glycol (MIRALAX / GLYCOLAX) 17 g packet Take 17 g by mouth daily as needed for mild constipation.    PROAIR HFA 108 (90 Base) MCG/ACT inhaler Inhale 2 puffs into the lungs every 4 (four) hours as needed for wheezing or shortness of breath.    ramipril (ALTACE) 10 MG capsule Take 10 mg by mouth daily.    rosuvastatin (CRESTOR) 20 MG tablet Take 20 mg by mouth at bedtime.    venlafaxine (EFFEXOR-XR) 150 MG 24 hr capsule Take 150 mg by mouth 2 (two) times daily with breakfast and lunch.    [DISCONTINUED] anastrozole (ARIMIDEX) 1 MG tablet Take 1 tablet (1 mg total) by mouth daily.    gabapentin (NEURONTIN) 600 MG tablet Take 1 tablet (600 mg total) by mouth at bedtime for 14 days. Take one tablet (600 mg) by mouth  every morning and two tablets (1200 mg) at night (Patient taking differently: Take 600-1,200 mg by mouth See admin instructions. Take one tablet (600 mg) by mouth every morning, one tablet (600 mg) at lunch and two tablets (1200 mg) at night)    No facility-administered encounter medications on file as of 06/30/2023.     Today's Vitals   06/30/23 1105 06/30/23 1113  BP: 121/60   Pulse: 88   Temp: 97.6 F (36.4 C)   TempSrc: Temporal   SpO2: 97%   Weight: 116 lb 12.8 oz (53 kg)   Height: 5\' 2"  (1.575 m)   PainSc:  9    Body mass index is 21.36 kg/m.   PHYSICAL EXAM GENERAL:alert, no distress and comfortable SKIN: no rash  EYES: sclera clear NECK: without mass LYMPH:  no palpable cervical or supraclavicular lymphadenopathy  LUNGS: clear with normal breathing effort HEART: regular rate & rhythm, no lower extremity edema ABDOMEN: abdomen soft, non-tender and normal bowel sounds MSK: no spinal TTP NEURO: alert & oriented x 3 with fluent speech, no focal motor/sensory deficits Breast exam: s/p nipple sparing b/l mastectomies, incisions healed. No palpable mass or nodularity along the reconstructed breasts, chest wall, or either axilla    CBC    Component Value Date/Time   WBC 12.6 (H) 07/14/2022 1947   RBC 3.26 (L) 07/14/2022 1947   HGB 10.2 (L) 07/14/2022 1947   HGB 10.7 (L) 06/09/2022 1420   HGB 10.4 (L) 09/01/2016 1030   HCT 32.2 (L) 07/14/2022 1947   HCT 31.6 (L) 09/01/2016 1030   PLT 204 07/14/2022 1947   PLT 215  06/09/2022 1420   PLT 240 09/01/2016 1030   MCV 98.8 07/14/2022 1947   MCV 90.6 09/01/2016 1030   MCH 31.3 07/14/2022 1947   MCHC 31.7 07/14/2022 1947   RDW 13.4 07/14/2022 1947   RDW 13.6 09/01/2016 1030   LYMPHSABS 2.6 06/09/2022 1420   LYMPHSABS 2.7 09/01/2016 1030   MONOABS 0.9 06/09/2022 1420   MONOABS 0.7 09/01/2016 1030   EOSABS 0.1 06/09/2022 1420   EOSABS 0.4 09/01/2016 1030   BASOSABS 0.1 06/09/2022 1420   BASOSABS 0.1 09/01/2016 1030      CMP     Component Value Date/Time   NA 137 07/14/2022 1947   NA 146 (H) 09/01/2016 1030   K 3.9 07/14/2022 1947   K 4.2 09/01/2016 1030   CL 102 07/14/2022 1947   CO2 27 07/14/2022 1947   CO2 28 09/01/2016 1030   GLUCOSE 97 07/14/2022 1947   GLUCOSE 96 09/01/2016 1030   BUN 28 (H) 07/14/2022 1947   BUN 16.8 09/01/2016 1030   CREATININE 1.63 (H) 07/14/2022 1947   CREATININE 1.49 (H) 06/09/2022 1420   CREATININE 1.54 (H) 12/31/2021 1058   CREATININE 1.3 (H) 09/01/2016 1030   CALCIUM 9.4 07/14/2022 1947   CALCIUM 9.9 09/01/2016 1030   PROT 6.6 06/09/2022 1420   PROT 6.7 09/01/2016 1030   ALBUMIN 4.2 06/09/2022 1420   ALBUMIN 3.8 09/01/2016 1030   AST 20 06/09/2022 1420   AST 29 09/01/2016 1030   ALT 16 06/09/2022 1420   ALT 23 09/01/2016 1030   ALKPHOS 54 06/09/2022 1420   ALKPHOS 63 09/01/2016 1030   BILITOT 0.3 06/09/2022 1420   BILITOT <0.22 09/01/2016 1030   GFRNONAA 33 (L) 07/14/2022 1947   GFRNONAA 37 (L) 06/09/2022 1420   GFRAA 46 (L) 10/14/2019 1255     ASSESSMENT & PLAN:Connie Bennett is a 73 y.o. female with    Breast cancer of upper-inner quadrant of left female breast (HCC) Invasive ductal carcinoma, pT2N0M0, stage IIA, G2, ER+/PR-/HER2-, ONCOTYPE RS 28  -diagnosed in 05/2015. S/p left breast mastectomy.  -Oncotype RS showed moderate risk, chemo ultimately not pursued. -She completed anti-estrogen therapy 07/2015 - 11/2022 -Ms. Conerly is clinically doing well.  Exam is benign, recent labs are stable, overall no clinical concern for recurrence -She prefers to continue annual follow-up in our clinic, knows to call sooner if needed. We reviewed signs and symptoms of recurrence   Ductal carcinoma in situ (DCIS) of right breast -diagnosed in 2016. S/p right lumpectomy and multiple re-excision which resulted in her ultimately having a right breast mastectomy which cured her.    Osteoporosis -Her 11/2017 DEXA shows Osteopenia with lowest T-score -2.2 at left  femur neck. Repeat in February 2022 showed worsening with T score -2.9 at the left femur neck, she has osteoporosis now -she began Zometa q44months on 11/07/20.  Last infusion canceled due to CKD  General health maintenance  -Continue pcp f/up and other age-appropriate health and cancer screenings; no smoking; avoid alcohol   PLAN: -Recent labs reviewed -Continue annual breast cancer surveillance -No role for mammogram s/p bilateral mastectomy -Follow-up in 1 year, or sooner if needed   All questions were answered. The patient knows to call the clinic with any problems, questions or concerns. No barriers to learning were detected. I spent 20 minutes counseling the patient face to face. The total time spent in the appointment was 30 minutes and more than 50% was on counseling, review of test results, and coordination of care.  Santiago Glad, NP-C 06/30/2023

## 2023-07-28 DIAGNOSIS — G8929 Other chronic pain: Secondary | ICD-10-CM | POA: Diagnosis not present

## 2023-07-28 DIAGNOSIS — M5442 Lumbago with sciatica, left side: Secondary | ICD-10-CM | POA: Diagnosis not present

## 2023-07-28 DIAGNOSIS — M545 Low back pain, unspecified: Secondary | ICD-10-CM | POA: Diagnosis not present

## 2023-07-28 DIAGNOSIS — M533 Sacrococcygeal disorders, not elsewhere classified: Secondary | ICD-10-CM | POA: Diagnosis not present

## 2023-07-28 DIAGNOSIS — F1190 Opioid use, unspecified, uncomplicated: Secondary | ICD-10-CM | POA: Diagnosis not present

## 2023-07-28 DIAGNOSIS — M62838 Other muscle spasm: Secondary | ICD-10-CM | POA: Diagnosis not present

## 2023-07-28 DIAGNOSIS — M25552 Pain in left hip: Secondary | ICD-10-CM | POA: Diagnosis not present

## 2023-07-28 DIAGNOSIS — G894 Chronic pain syndrome: Secondary | ICD-10-CM | POA: Diagnosis not present

## 2023-07-28 DIAGNOSIS — K5903 Drug induced constipation: Secondary | ICD-10-CM | POA: Diagnosis not present

## 2023-07-28 DIAGNOSIS — Z9689 Presence of other specified functional implants: Secondary | ICD-10-CM | POA: Diagnosis not present

## 2023-07-28 DIAGNOSIS — M25551 Pain in right hip: Secondary | ICD-10-CM | POA: Diagnosis not present

## 2023-07-28 DIAGNOSIS — M5441 Lumbago with sciatica, right side: Secondary | ICD-10-CM | POA: Diagnosis not present

## 2023-08-29 DIAGNOSIS — M810 Age-related osteoporosis without current pathological fracture: Secondary | ICD-10-CM | POA: Diagnosis not present

## 2023-08-29 DIAGNOSIS — F331 Major depressive disorder, recurrent, moderate: Secondary | ICD-10-CM | POA: Diagnosis not present

## 2023-08-29 DIAGNOSIS — E782 Mixed hyperlipidemia: Secondary | ICD-10-CM | POA: Diagnosis not present

## 2023-08-29 DIAGNOSIS — N183 Chronic kidney disease, stage 3 unspecified: Secondary | ICD-10-CM | POA: Diagnosis not present

## 2023-09-01 DIAGNOSIS — I739 Peripheral vascular disease, unspecified: Secondary | ICD-10-CM | POA: Diagnosis not present

## 2023-09-01 DIAGNOSIS — N183 Chronic kidney disease, stage 3 unspecified: Secondary | ICD-10-CM | POA: Diagnosis not present

## 2023-09-01 DIAGNOSIS — I129 Hypertensive chronic kidney disease with stage 1 through stage 4 chronic kidney disease, or unspecified chronic kidney disease: Secondary | ICD-10-CM | POA: Diagnosis not present

## 2023-09-09 DIAGNOSIS — M62838 Other muscle spasm: Secondary | ICD-10-CM | POA: Diagnosis not present

## 2023-09-09 DIAGNOSIS — M5441 Lumbago with sciatica, right side: Secondary | ICD-10-CM | POA: Diagnosis not present

## 2023-09-09 DIAGNOSIS — K5903 Drug induced constipation: Secondary | ICD-10-CM | POA: Diagnosis not present

## 2023-09-09 DIAGNOSIS — Z9689 Presence of other specified functional implants: Secondary | ICD-10-CM | POA: Diagnosis not present

## 2023-09-09 DIAGNOSIS — G894 Chronic pain syndrome: Secondary | ICD-10-CM | POA: Diagnosis not present

## 2023-09-09 DIAGNOSIS — M5442 Lumbago with sciatica, left side: Secondary | ICD-10-CM | POA: Diagnosis not present

## 2023-09-21 DIAGNOSIS — M19112 Post-traumatic osteoarthritis, left shoulder: Secondary | ICD-10-CM | POA: Diagnosis not present

## 2023-09-21 DIAGNOSIS — M19111 Post-traumatic osteoarthritis, right shoulder: Secondary | ICD-10-CM | POA: Diagnosis not present

## 2023-09-28 DIAGNOSIS — N183 Chronic kidney disease, stage 3 unspecified: Secondary | ICD-10-CM | POA: Diagnosis not present

## 2023-09-28 DIAGNOSIS — I739 Peripheral vascular disease, unspecified: Secondary | ICD-10-CM | POA: Diagnosis not present

## 2023-09-28 DIAGNOSIS — I129 Hypertensive chronic kidney disease with stage 1 through stage 4 chronic kidney disease, or unspecified chronic kidney disease: Secondary | ICD-10-CM | POA: Diagnosis not present

## 2023-09-30 DIAGNOSIS — N183 Chronic kidney disease, stage 3 unspecified: Secondary | ICD-10-CM | POA: Diagnosis not present

## 2023-09-30 DIAGNOSIS — I129 Hypertensive chronic kidney disease with stage 1 through stage 4 chronic kidney disease, or unspecified chronic kidney disease: Secondary | ICD-10-CM | POA: Diagnosis not present

## 2023-09-30 DIAGNOSIS — I739 Peripheral vascular disease, unspecified: Secondary | ICD-10-CM | POA: Diagnosis not present

## 2023-10-01 DIAGNOSIS — Z8262 Family history of osteoporosis: Secondary | ICD-10-CM | POA: Diagnosis not present

## 2023-10-01 DIAGNOSIS — M8588 Other specified disorders of bone density and structure, other site: Secondary | ICD-10-CM | POA: Diagnosis not present

## 2023-10-07 DIAGNOSIS — M62838 Other muscle spasm: Secondary | ICD-10-CM | POA: Diagnosis not present

## 2023-10-07 DIAGNOSIS — M5441 Lumbago with sciatica, right side: Secondary | ICD-10-CM | POA: Diagnosis not present

## 2023-10-07 DIAGNOSIS — G894 Chronic pain syndrome: Secondary | ICD-10-CM | POA: Diagnosis not present

## 2023-10-07 DIAGNOSIS — Z79899 Other long term (current) drug therapy: Secondary | ICD-10-CM | POA: Diagnosis not present

## 2023-10-07 DIAGNOSIS — F1190 Opioid use, unspecified, uncomplicated: Secondary | ICD-10-CM | POA: Diagnosis not present

## 2023-10-07 DIAGNOSIS — M5442 Lumbago with sciatica, left side: Secondary | ICD-10-CM | POA: Diagnosis not present

## 2023-10-07 DIAGNOSIS — Z9689 Presence of other specified functional implants: Secondary | ICD-10-CM | POA: Diagnosis not present

## 2023-10-07 DIAGNOSIS — K5903 Drug induced constipation: Secondary | ICD-10-CM | POA: Diagnosis not present

## 2023-10-07 DIAGNOSIS — Z5181 Encounter for therapeutic drug level monitoring: Secondary | ICD-10-CM | POA: Diagnosis not present

## 2023-10-21 DIAGNOSIS — N183 Chronic kidney disease, stage 3 unspecified: Secondary | ICD-10-CM | POA: Diagnosis not present

## 2023-10-21 DIAGNOSIS — E782 Mixed hyperlipidemia: Secondary | ICD-10-CM | POA: Diagnosis not present

## 2023-10-21 DIAGNOSIS — R7303 Prediabetes: Secondary | ICD-10-CM | POA: Diagnosis not present

## 2023-10-21 DIAGNOSIS — I129 Hypertensive chronic kidney disease with stage 1 through stage 4 chronic kidney disease, or unspecified chronic kidney disease: Secondary | ICD-10-CM | POA: Diagnosis not present

## 2023-10-23 ENCOUNTER — Encounter: Payer: Self-pay | Admitting: Hematology

## 2023-10-29 DIAGNOSIS — I739 Peripheral vascular disease, unspecified: Secondary | ICD-10-CM | POA: Diagnosis not present

## 2023-10-29 DIAGNOSIS — N183 Chronic kidney disease, stage 3 unspecified: Secondary | ICD-10-CM | POA: Diagnosis not present

## 2023-10-29 DIAGNOSIS — M810 Age-related osteoporosis without current pathological fracture: Secondary | ICD-10-CM | POA: Diagnosis not present

## 2023-10-29 DIAGNOSIS — F331 Major depressive disorder, recurrent, moderate: Secondary | ICD-10-CM | POA: Diagnosis not present

## 2023-10-29 DIAGNOSIS — E782 Mixed hyperlipidemia: Secondary | ICD-10-CM | POA: Diagnosis not present

## 2023-10-29 DIAGNOSIS — I129 Hypertensive chronic kidney disease with stage 1 through stage 4 chronic kidney disease, or unspecified chronic kidney disease: Secondary | ICD-10-CM | POA: Diagnosis not present

## 2023-10-30 DIAGNOSIS — I739 Peripheral vascular disease, unspecified: Secondary | ICD-10-CM | POA: Diagnosis not present

## 2023-10-30 DIAGNOSIS — N183 Chronic kidney disease, stage 3 unspecified: Secondary | ICD-10-CM | POA: Diagnosis not present

## 2023-10-30 DIAGNOSIS — I129 Hypertensive chronic kidney disease with stage 1 through stage 4 chronic kidney disease, or unspecified chronic kidney disease: Secondary | ICD-10-CM | POA: Diagnosis not present

## 2023-11-09 DIAGNOSIS — H2513 Age-related nuclear cataract, bilateral: Secondary | ICD-10-CM | POA: Diagnosis not present

## 2023-11-09 DIAGNOSIS — H3554 Dystrophies primarily involving the retinal pigment epithelium: Secondary | ICD-10-CM | POA: Diagnosis not present

## 2023-12-09 DIAGNOSIS — F3181 Bipolar II disorder: Secondary | ICD-10-CM | POA: Diagnosis not present

## 2023-12-22 DIAGNOSIS — M533 Sacrococcygeal disorders, not elsewhere classified: Secondary | ICD-10-CM | POA: Diagnosis not present

## 2023-12-22 DIAGNOSIS — M62838 Other muscle spasm: Secondary | ICD-10-CM | POA: Diagnosis not present

## 2023-12-22 DIAGNOSIS — G894 Chronic pain syndrome: Secondary | ICD-10-CM | POA: Diagnosis not present

## 2023-12-22 DIAGNOSIS — K5903 Drug induced constipation: Secondary | ICD-10-CM | POA: Diagnosis not present

## 2023-12-22 DIAGNOSIS — M797 Fibromyalgia: Secondary | ICD-10-CM | POA: Diagnosis not present

## 2023-12-22 DIAGNOSIS — M5441 Lumbago with sciatica, right side: Secondary | ICD-10-CM | POA: Diagnosis not present

## 2023-12-22 DIAGNOSIS — Z9689 Presence of other specified functional implants: Secondary | ICD-10-CM | POA: Diagnosis not present

## 2023-12-22 DIAGNOSIS — F1190 Opioid use, unspecified, uncomplicated: Secondary | ICD-10-CM | POA: Diagnosis not present

## 2023-12-24 DIAGNOSIS — K219 Gastro-esophageal reflux disease without esophagitis: Secondary | ICD-10-CM | POA: Diagnosis not present

## 2023-12-24 DIAGNOSIS — I129 Hypertensive chronic kidney disease with stage 1 through stage 4 chronic kidney disease, or unspecified chronic kidney disease: Secondary | ICD-10-CM | POA: Diagnosis not present

## 2023-12-24 DIAGNOSIS — J029 Acute pharyngitis, unspecified: Secondary | ICD-10-CM | POA: Diagnosis not present

## 2024-01-22 DIAGNOSIS — M1711 Unilateral primary osteoarthritis, right knee: Secondary | ICD-10-CM | POA: Diagnosis not present

## 2024-01-22 DIAGNOSIS — M19111 Post-traumatic osteoarthritis, right shoulder: Secondary | ICD-10-CM | POA: Diagnosis not present

## 2024-02-12 DIAGNOSIS — M19012 Primary osteoarthritis, left shoulder: Secondary | ICD-10-CM | POA: Diagnosis not present

## 2024-02-12 DIAGNOSIS — M7062 Trochanteric bursitis, left hip: Secondary | ICD-10-CM | POA: Diagnosis not present

## 2024-03-08 DIAGNOSIS — M797 Fibromyalgia: Secondary | ICD-10-CM | POA: Diagnosis not present

## 2024-03-08 DIAGNOSIS — G894 Chronic pain syndrome: Secondary | ICD-10-CM | POA: Diagnosis not present

## 2024-03-08 DIAGNOSIS — F1190 Opioid use, unspecified, uncomplicated: Secondary | ICD-10-CM | POA: Diagnosis not present

## 2024-03-08 DIAGNOSIS — M533 Sacrococcygeal disorders, not elsewhere classified: Secondary | ICD-10-CM | POA: Diagnosis not present

## 2024-03-08 DIAGNOSIS — M545 Low back pain, unspecified: Secondary | ICD-10-CM | POA: Diagnosis not present

## 2024-07-04 ENCOUNTER — Inpatient Hospital Stay: Admitting: Nurse Practitioner

## 2024-07-04 ENCOUNTER — Inpatient Hospital Stay
